# Patient Record
Sex: Male | Born: 1935 | Race: White | Hispanic: No | Marital: Married | Smoking: Former smoker
Health system: Southern US, Community
[De-identification: ages and names within clinical notes are randomized; demographics above are authoritative.]

## PROBLEM LIST (undated history)

## (undated) DIAGNOSIS — N182 Chronic kidney disease, stage 2 (mild): Secondary | ICD-10-CM

## (undated) DIAGNOSIS — C801 Malignant (primary) neoplasm, unspecified: Secondary | ICD-10-CM

## (undated) DIAGNOSIS — E785 Hyperlipidemia, unspecified: Secondary | ICD-10-CM

## (undated) DIAGNOSIS — J449 Chronic obstructive pulmonary disease, unspecified: Secondary | ICD-10-CM

## (undated) DIAGNOSIS — R7303 Prediabetes: Secondary | ICD-10-CM

## (undated) DIAGNOSIS — Q631 Lobulated, fused and horseshoe kidney: Secondary | ICD-10-CM

## (undated) DIAGNOSIS — G250 Essential tremor: Secondary | ICD-10-CM

## (undated) DIAGNOSIS — R55 Syncope and collapse: Secondary | ICD-10-CM

## (undated) DIAGNOSIS — D649 Anemia, unspecified: Secondary | ICD-10-CM

## (undated) DIAGNOSIS — I2510 Atherosclerotic heart disease of native coronary artery without angina pectoris: Secondary | ICD-10-CM

## (undated) DIAGNOSIS — F17201 Nicotine dependence, unspecified, in remission: Secondary | ICD-10-CM

## (undated) DIAGNOSIS — I10 Essential (primary) hypertension: Secondary | ICD-10-CM

## (undated) DIAGNOSIS — I679 Cerebrovascular disease, unspecified: Secondary | ICD-10-CM

## (undated) DIAGNOSIS — N289 Disorder of kidney and ureter, unspecified: Secondary | ICD-10-CM

## (undated) DIAGNOSIS — K449 Diaphragmatic hernia without obstruction or gangrene: Secondary | ICD-10-CM

## (undated) DIAGNOSIS — J984 Other disorders of lung: Secondary | ICD-10-CM

## (undated) DIAGNOSIS — M109 Gout, unspecified: Secondary | ICD-10-CM

## (undated) DIAGNOSIS — C3490 Malignant neoplasm of unspecified part of unspecified bronchus or lung: Secondary | ICD-10-CM

## (undated) HISTORY — PX: CARDIAC SURGERY: SHX584

## (undated) HISTORY — DX: Anemia, unspecified: D64.9

## (undated) HISTORY — DX: Disorder of kidney and ureter, unspecified: N28.9

## (undated) HISTORY — DX: Nicotine dependence, unspecified, in remission: F17.201

## (undated) HISTORY — DX: Hyperlipidemia, unspecified: E78.5

## (undated) HISTORY — DX: Essential tremor: G25.0

## (undated) HISTORY — DX: Chronic obstructive pulmonary disease, unspecified: J44.9

## (undated) HISTORY — PX: LESION EXCISION: SHX5167

## (undated) HISTORY — DX: Cerebrovascular disease, unspecified: I67.9

## (undated) HISTORY — DX: Chronic kidney disease, stage 2 (mild): N18.2

## (undated) HISTORY — DX: Essential (primary) hypertension: I10

## (undated) HISTORY — DX: Atherosclerotic heart disease of native coronary artery without angina pectoris: I25.10

## (undated) HISTORY — DX: Syncope and collapse: R55

## (undated) HISTORY — DX: Lobulated, fused and horseshoe kidney: Q63.1

---
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
## 1983-04-14 HISTORY — PX: COLONOSCOPY W/ POLYPECTOMY: SHX1380

## 1993-04-13 HISTORY — PX: CORONARY ARTERY BYPASS GRAFT: SHX141

## 2000-10-08 ENCOUNTER — Ambulatory Visit (HOSPITAL_COMMUNITY): Admission: RE | Admit: 2000-10-08 | Discharge: 2000-10-08 | Payer: Self-pay | Admitting: Cardiology

## 2000-10-12 ENCOUNTER — Ambulatory Visit (HOSPITAL_COMMUNITY): Admission: RE | Admit: 2000-10-12 | Discharge: 2000-10-13 | Payer: Self-pay | Admitting: Cardiology

## 2000-11-28 ENCOUNTER — Emergency Department (HOSPITAL_COMMUNITY): Admission: EM | Admit: 2000-11-28 | Discharge: 2000-11-28 | Payer: Self-pay | Admitting: Emergency Medicine

## 2000-11-28 ENCOUNTER — Encounter: Payer: Self-pay | Admitting: Emergency Medicine

## 2000-11-30 ENCOUNTER — Encounter: Payer: Self-pay | Admitting: Family Medicine

## 2000-11-30 ENCOUNTER — Ambulatory Visit (HOSPITAL_COMMUNITY): Admission: RE | Admit: 2000-11-30 | Discharge: 2000-11-30 | Payer: Self-pay | Admitting: Family Medicine

## 2001-01-18 ENCOUNTER — Ambulatory Visit (HOSPITAL_COMMUNITY): Admission: RE | Admit: 2001-01-18 | Discharge: 2001-01-18 | Payer: Self-pay | Admitting: Internal Medicine

## 2001-03-19 ENCOUNTER — Encounter: Payer: Self-pay | Admitting: Internal Medicine

## 2001-03-19 ENCOUNTER — Emergency Department (HOSPITAL_COMMUNITY): Admission: EM | Admit: 2001-03-19 | Discharge: 2001-03-19 | Payer: Self-pay | Admitting: Internal Medicine

## 2001-03-20 ENCOUNTER — Ambulatory Visit (HOSPITAL_COMMUNITY): Admission: RE | Admit: 2001-03-20 | Discharge: 2001-03-20 | Payer: Self-pay | Admitting: *Deleted

## 2001-03-20 ENCOUNTER — Encounter: Payer: Self-pay | Admitting: *Deleted

## 2001-08-15 ENCOUNTER — Ambulatory Visit (HOSPITAL_COMMUNITY): Admission: RE | Admit: 2001-08-15 | Discharge: 2001-08-15 | Payer: Self-pay | Admitting: Cardiology

## 2001-08-15 ENCOUNTER — Encounter: Payer: Self-pay | Admitting: Cardiology

## 2002-08-24 ENCOUNTER — Other Ambulatory Visit: Admission: RE | Admit: 2002-08-24 | Discharge: 2002-08-24 | Payer: Self-pay | Admitting: Dermatology

## 2002-09-05 ENCOUNTER — Ambulatory Visit (HOSPITAL_COMMUNITY): Admission: RE | Admit: 2002-09-05 | Discharge: 2002-09-05 | Payer: Self-pay | Admitting: Family Medicine

## 2002-09-05 ENCOUNTER — Encounter: Payer: Self-pay | Admitting: Family Medicine

## 2002-09-18 ENCOUNTER — Encounter: Payer: Self-pay | Admitting: Orthopedic Surgery

## 2002-09-18 ENCOUNTER — Ambulatory Visit (HOSPITAL_COMMUNITY): Admission: RE | Admit: 2002-09-18 | Discharge: 2002-09-18 | Payer: Self-pay | Admitting: Orthopedic Surgery

## 2003-12-28 ENCOUNTER — Emergency Department (HOSPITAL_COMMUNITY): Admission: EM | Admit: 2003-12-28 | Discharge: 2003-12-28 | Payer: Self-pay | Admitting: Emergency Medicine

## 2004-07-22 ENCOUNTER — Ambulatory Visit (HOSPITAL_COMMUNITY): Admission: RE | Admit: 2004-07-22 | Discharge: 2004-07-22 | Payer: Self-pay | Admitting: Family Medicine

## 2004-08-07 ENCOUNTER — Ambulatory Visit: Payer: Self-pay | Admitting: Cardiology

## 2004-08-21 ENCOUNTER — Ambulatory Visit: Payer: Self-pay | Admitting: Cardiology

## 2004-12-25 ENCOUNTER — Ambulatory Visit (HOSPITAL_COMMUNITY): Admission: RE | Admit: 2004-12-25 | Discharge: 2004-12-25 | Payer: Self-pay | Admitting: Family Medicine

## 2005-01-02 ENCOUNTER — Ambulatory Visit (HOSPITAL_COMMUNITY): Admission: RE | Admit: 2005-01-02 | Discharge: 2005-01-02 | Payer: Self-pay | Admitting: Family Medicine

## 2005-01-12 ENCOUNTER — Encounter (HOSPITAL_COMMUNITY): Admission: RE | Admit: 2005-01-12 | Discharge: 2005-02-11 | Payer: Self-pay | Admitting: Family Medicine

## 2005-02-16 ENCOUNTER — Ambulatory Visit (HOSPITAL_COMMUNITY): Admission: RE | Admit: 2005-02-16 | Discharge: 2005-02-16 | Payer: Self-pay | Admitting: Family Medicine

## 2005-02-19 ENCOUNTER — Ambulatory Visit: Payer: Self-pay | Admitting: Cardiology

## 2005-02-20 ENCOUNTER — Ambulatory Visit (HOSPITAL_COMMUNITY): Admission: RE | Admit: 2005-02-20 | Discharge: 2005-02-20 | Payer: Self-pay | Admitting: Cardiology

## 2005-02-25 ENCOUNTER — Ambulatory Visit (HOSPITAL_COMMUNITY): Admission: RE | Admit: 2005-02-25 | Discharge: 2005-02-25 | Payer: Self-pay

## 2005-02-25 ENCOUNTER — Ambulatory Visit: Payer: Self-pay | Admitting: Cardiology

## 2005-03-18 ENCOUNTER — Ambulatory Visit: Payer: Self-pay | Admitting: Internal Medicine

## 2005-04-09 ENCOUNTER — Ambulatory Visit: Payer: Self-pay | Admitting: Internal Medicine

## 2005-04-09 ENCOUNTER — Ambulatory Visit (HOSPITAL_COMMUNITY): Admission: RE | Admit: 2005-04-09 | Discharge: 2005-04-09 | Payer: Self-pay | Admitting: Internal Medicine

## 2005-04-13 ENCOUNTER — Emergency Department (HOSPITAL_COMMUNITY): Admission: EM | Admit: 2005-04-13 | Discharge: 2005-04-14 | Payer: Self-pay | Admitting: Emergency Medicine

## 2005-04-24 ENCOUNTER — Ambulatory Visit (HOSPITAL_COMMUNITY): Admission: RE | Admit: 2005-04-24 | Discharge: 2005-04-24 | Payer: Self-pay | Admitting: Family Medicine

## 2005-04-24 ENCOUNTER — Inpatient Hospital Stay (HOSPITAL_COMMUNITY): Admission: AD | Admit: 2005-04-24 | Discharge: 2005-05-02 | Payer: Self-pay | Admitting: Family Medicine

## 2005-05-13 ENCOUNTER — Ambulatory Visit (HOSPITAL_COMMUNITY): Admission: RE | Admit: 2005-05-13 | Discharge: 2005-05-13 | Payer: Self-pay | Admitting: Family Medicine

## 2005-06-06 ENCOUNTER — Emergency Department (HOSPITAL_COMMUNITY): Admission: EM | Admit: 2005-06-06 | Discharge: 2005-06-06 | Payer: Self-pay | Admitting: Emergency Medicine

## 2005-06-07 ENCOUNTER — Inpatient Hospital Stay (HOSPITAL_COMMUNITY): Admission: EM | Admit: 2005-06-07 | Discharge: 2005-06-09 | Payer: Self-pay | Admitting: Emergency Medicine

## 2005-06-08 ENCOUNTER — Ambulatory Visit: Payer: Self-pay | Admitting: *Deleted

## 2005-09-22 ENCOUNTER — Ambulatory Visit (HOSPITAL_COMMUNITY): Admission: RE | Admit: 2005-09-22 | Discharge: 2005-09-22 | Payer: Self-pay | Admitting: Family Medicine

## 2006-07-26 ENCOUNTER — Ambulatory Visit (HOSPITAL_COMMUNITY): Admission: RE | Admit: 2006-07-26 | Discharge: 2006-07-26 | Payer: Self-pay | Admitting: Family Medicine

## 2007-02-06 ENCOUNTER — Emergency Department (HOSPITAL_COMMUNITY): Admission: EM | Admit: 2007-02-06 | Discharge: 2007-02-07 | Payer: Self-pay | Admitting: Emergency Medicine

## 2007-02-10 ENCOUNTER — Ambulatory Visit (HOSPITAL_COMMUNITY): Admission: RE | Admit: 2007-02-10 | Discharge: 2007-02-10 | Payer: Self-pay | Admitting: Family Medicine

## 2007-02-24 ENCOUNTER — Ambulatory Visit: Payer: Self-pay | Admitting: *Deleted

## 2007-07-21 ENCOUNTER — Emergency Department (HOSPITAL_COMMUNITY): Admission: EM | Admit: 2007-07-21 | Discharge: 2007-07-21 | Payer: Self-pay | Admitting: Emergency Medicine

## 2007-08-19 ENCOUNTER — Ambulatory Visit (HOSPITAL_COMMUNITY): Admission: RE | Admit: 2007-08-19 | Discharge: 2007-08-19 | Payer: Self-pay | Admitting: Family Medicine

## 2008-04-04 ENCOUNTER — Emergency Department (HOSPITAL_COMMUNITY): Admission: EM | Admit: 2008-04-04 | Discharge: 2008-04-04 | Payer: Self-pay | Admitting: Emergency Medicine

## 2008-06-25 ENCOUNTER — Emergency Department (HOSPITAL_COMMUNITY): Admission: EM | Admit: 2008-06-25 | Discharge: 2008-06-25 | Payer: Self-pay | Admitting: Emergency Medicine

## 2008-07-17 ENCOUNTER — Ambulatory Visit: Payer: Self-pay | Admitting: Cardiology

## 2008-08-16 ENCOUNTER — Ambulatory Visit: Payer: Self-pay | Admitting: Cardiology

## 2008-08-30 ENCOUNTER — Ambulatory Visit: Payer: Self-pay | Admitting: Cardiology

## 2008-08-31 ENCOUNTER — Telehealth: Payer: Self-pay | Admitting: Cardiology

## 2008-09-14 ENCOUNTER — Telehealth: Payer: Self-pay | Admitting: Cardiology

## 2008-09-18 ENCOUNTER — Encounter: Payer: Self-pay | Admitting: Adult Health

## 2008-09-18 LAB — CONVERTED CEMR LAB
Chloride: 107 meq/L
Creatinine, Ser: 1.64 mg/dL
Glucose, Bld: 98 mg/dL

## 2009-01-14 DIAGNOSIS — I10 Essential (primary) hypertension: Secondary | ICD-10-CM

## 2009-01-17 ENCOUNTER — Ambulatory Visit: Payer: Self-pay | Admitting: Cardiology

## 2009-01-17 ENCOUNTER — Ambulatory Visit (HOSPITAL_COMMUNITY): Admission: RE | Admit: 2009-01-17 | Discharge: 2009-01-17 | Payer: Self-pay | Admitting: Adult Health

## 2009-01-17 ENCOUNTER — Encounter: Payer: Self-pay | Admitting: Adult Health

## 2009-01-17 LAB — CONVERTED CEMR LAB
Basophils Absolute: 0.1 10*3/uL (ref 0.0–0.1)
Chloride: 105 meq/L (ref 96–112)
Eosinophils Absolute: 0.4 10*3/uL (ref 0.0–0.7)
Eosinophils Relative: 5 % (ref 0–5)
Glucose, Bld: 90 mg/dL (ref 70–99)
Lymphs Abs: 2.5 10*3/uL (ref 0.7–4.0)
Monocytes Absolute: 1.3 10*3/uL — ABNORMAL HIGH (ref 0.1–1.0)
Monocytes Relative: 15 % — ABNORMAL HIGH (ref 3–12)
Potassium: 4.3 meq/L (ref 3.5–5.3)
Pro B Natriuretic peptide (BNP): 171.6 pg/mL — ABNORMAL HIGH (ref 0.0–100.0)
Sodium: 144 meq/L (ref 135–145)

## 2009-01-18 ENCOUNTER — Ambulatory Visit (HOSPITAL_COMMUNITY): Admission: RE | Admit: 2009-01-18 | Discharge: 2009-01-18 | Payer: Self-pay | Admitting: Cardiology

## 2009-01-18 ENCOUNTER — Ambulatory Visit: Payer: Self-pay | Admitting: Cardiovascular Disease

## 2009-01-18 ENCOUNTER — Encounter: Payer: Self-pay | Admitting: Cardiology

## 2009-01-30 ENCOUNTER — Ambulatory Visit: Payer: Self-pay | Admitting: Cardiology

## 2009-01-30 DIAGNOSIS — N182 Chronic kidney disease, stage 2 (mild): Secondary | ICD-10-CM

## 2009-04-01 ENCOUNTER — Encounter (INDEPENDENT_AMBULATORY_CARE_PROVIDER_SITE_OTHER): Payer: Self-pay | Admitting: *Deleted

## 2009-04-01 LAB — CONVERTED CEMR LAB
BUN: 28 mg/dL
Brain Natriuretic Peptide: 171.6
CO2: 25 meq/L
Chloride: 105 meq/L
Glucose, Bld: 90 mg/dL

## 2009-05-10 ENCOUNTER — Inpatient Hospital Stay (HOSPITAL_COMMUNITY): Admission: EM | Admit: 2009-05-10 | Discharge: 2009-05-12 | Payer: Self-pay | Admitting: Emergency Medicine

## 2009-05-13 ENCOUNTER — Encounter (INDEPENDENT_AMBULATORY_CARE_PROVIDER_SITE_OTHER): Payer: Self-pay | Admitting: *Deleted

## 2009-05-20 ENCOUNTER — Ambulatory Visit: Payer: Self-pay | Admitting: Cardiology

## 2009-05-20 ENCOUNTER — Encounter (INDEPENDENT_AMBULATORY_CARE_PROVIDER_SITE_OTHER): Payer: Self-pay | Admitting: *Deleted

## 2009-05-20 DIAGNOSIS — J449 Chronic obstructive pulmonary disease, unspecified: Secondary | ICD-10-CM

## 2009-05-20 DIAGNOSIS — G250 Essential tremor: Secondary | ICD-10-CM

## 2009-05-20 DIAGNOSIS — D649 Anemia, unspecified: Secondary | ICD-10-CM

## 2009-05-20 DIAGNOSIS — I2510 Atherosclerotic heart disease of native coronary artery without angina pectoris: Secondary | ICD-10-CM | POA: Insufficient documentation

## 2009-05-20 DIAGNOSIS — J4489 Other specified chronic obstructive pulmonary disease: Secondary | ICD-10-CM | POA: Insufficient documentation

## 2009-05-20 DIAGNOSIS — E785 Hyperlipidemia, unspecified: Secondary | ICD-10-CM

## 2009-05-20 DIAGNOSIS — I679 Cerebrovascular disease, unspecified: Secondary | ICD-10-CM | POA: Insufficient documentation

## 2009-05-20 DIAGNOSIS — D126 Benign neoplasm of colon, unspecified: Secondary | ICD-10-CM | POA: Insufficient documentation

## 2009-05-24 ENCOUNTER — Encounter (INDEPENDENT_AMBULATORY_CARE_PROVIDER_SITE_OTHER): Payer: Self-pay | Admitting: *Deleted

## 2009-05-24 LAB — CONVERTED CEMR LAB: OCCULT 2: NEGATIVE

## 2009-05-30 ENCOUNTER — Encounter: Payer: Self-pay | Admitting: Cardiology

## 2009-05-31 ENCOUNTER — Encounter (INDEPENDENT_AMBULATORY_CARE_PROVIDER_SITE_OTHER): Payer: Self-pay | Admitting: *Deleted

## 2009-06-18 ENCOUNTER — Ambulatory Visit (HOSPITAL_COMMUNITY)
Admission: RE | Admit: 2009-06-18 | Discharge: 2009-06-18 | Payer: Self-pay | Source: Home / Self Care | Admitting: Cardiology

## 2009-06-21 ENCOUNTER — Ambulatory Visit (HOSPITAL_COMMUNITY): Admission: RE | Admit: 2009-06-21 | Discharge: 2009-06-21 | Payer: Self-pay | Admitting: Family Medicine

## 2009-06-24 ENCOUNTER — Telehealth: Payer: Self-pay | Admitting: Cardiology

## 2009-06-24 ENCOUNTER — Encounter (INDEPENDENT_AMBULATORY_CARE_PROVIDER_SITE_OTHER): Payer: Self-pay | Admitting: *Deleted

## 2009-07-15 ENCOUNTER — Ambulatory Visit: Payer: Self-pay | Admitting: Surgery

## 2009-08-16 ENCOUNTER — Encounter: Payer: Self-pay | Admitting: Cardiology

## 2009-08-16 ENCOUNTER — Encounter (INDEPENDENT_AMBULATORY_CARE_PROVIDER_SITE_OTHER): Payer: Self-pay | Admitting: *Deleted

## 2009-08-16 LAB — CONVERTED CEMR LAB
ALT: 12 units/L
ALT: 12 units/L (ref 0–53)
Albumin: 4.2 g/dL
Albumin: 4.2 g/dL (ref 3.5–5.2)
Alkaline Phosphatase: 91 units/L
Alkaline Phosphatase: 91 units/L (ref 39–117)
Basophils Absolute: 0.1 10*3/uL (ref 0.0–0.1)
CO2: 26 meq/L
Calcium: 9.4 mg/dL
Chloride: 107 meq/L (ref 96–112)
Creatinine, Ser: 1.46 mg/dL (ref 0.40–1.50)
Eosinophils Absolute: 0.4 10*3/uL
Eosinophils Relative: 5 %
Eosinophils Relative: 5 % (ref 0–5)
Glucose, Bld: 105 mg/dL
HCT: 37.2 %
Hemoglobin: 11.9 g/dL
Iron: 85 ug/dL
MCHC: 32 g/dL
Monocytes Absolute: 0.8 10*3/uL (ref 0.1–1.0)
Monocytes Relative: 11 %
RBC: 3.89 M/uL
RDW: 14.8 % (ref 11.5–15.5)
Saturation Ratios: 29 %
Saturation Ratios: 29 % (ref 20–55)
Sodium: 142 meq/L
TIBC: 294 ug/dL (ref 215–435)
Total Bilirubin: 0.7 mg/dL (ref 0.3–1.2)
Total Protein: 6.4 g/dL (ref 6.0–8.3)
UIBC: 209 ug/dL
UIBC: 209 ug/dL
WBC: 7.5 10*3/uL

## 2009-08-19 ENCOUNTER — Encounter (INDEPENDENT_AMBULATORY_CARE_PROVIDER_SITE_OTHER): Payer: Self-pay | Admitting: *Deleted

## 2009-11-26 ENCOUNTER — Ambulatory Visit: Payer: Self-pay | Admitting: Cardiology

## 2009-11-26 ENCOUNTER — Encounter: Payer: Self-pay | Admitting: Adult Health

## 2009-12-30 ENCOUNTER — Ambulatory Visit: Payer: Self-pay

## 2009-12-30 ENCOUNTER — Ambulatory Visit: Payer: Self-pay | Admitting: Cardiovascular Disease

## 2010-02-27 ENCOUNTER — Ambulatory Visit: Payer: Self-pay | Admitting: Internal Medicine

## 2010-02-27 ENCOUNTER — Ambulatory Visit (HOSPITAL_COMMUNITY): Admission: RE | Admit: 2010-02-27 | Discharge: 2010-02-27 | Payer: Self-pay | Admitting: Internal Medicine

## 2010-05-04 ENCOUNTER — Encounter: Payer: Self-pay | Admitting: Cardiology

## 2010-05-13 NOTE — Assessment & Plan Note (Signed)
Summary: NP6 CARTOID ARTERY STENOSIS    Visit Type:  Initial Consult Primary Provider:  Dr.Scott Luking  CC:  sob.  History of Present Illness: 77 year-old male presents for evaluation of carotid artery stenosis. The patient has been followed with serial duplex ultrasound scans over the past several years through the [facility], but is no longer followed there since Dr Madilyn Fireman' departure. He has had carotid velocities showing moderate carotid stenosis, but with heavy calcification obscuring the vessels.   The patient has no history of TIA or stroke. He denies any history of amaurosis, aphasia, facial droop, numbness, weakness, or clumsiness of his extremities.  Current Medications (verified): 1)  Metoprolol Tartrate 50 Mg Tabs (Metoprolol Tartrate) .... Take One Tablet By Mouth Once Daily 2)  Cardizem Cd 180 Mg Xr24h-Cap (Diltiazem Hcl Coated Beads) .... Take 1 Tab Daily 3)  Simvastatin 80 Mg Tabs (Simvastatin) .... Take One Half Tablet By Mouth Daily 4)  Furosemide 40 Mg Tabs (Furosemide) .... Take 2 Tablets By Mouth Daily 5)  Terazosin Hcl 5 Mg Caps (Terazosin Hcl) .... Take 1 Tab Daily 6)  Aspir-Low 81 Mg Tbec (Aspirin) .... Take 1 Tab Daily 7)  Combivent 103-18 Mcg/act Aero (Ipratropium-Albuterol) .... Take As Needed 8)  Daily Multi  Tabs (Multiple Vitamins-Minerals) .... Take 1 Tab Daily  Allergies: 1)  ! Sulfa 2)  ! Penicillin 3)  ! Beta Blockers  Past History:  Past medical, surgical, family and social histories (including risk factors) reviewed, and no changes noted (except as noted below).  Past Medical History: Reviewed history from 05/20/2009 and no changes required. ASCVD: CABG surgery in 04/1993; and negative stress nuclear study in 08/2001 Hyperlipidemia SYNCOPE (ICD-780.2) HYPERTENSION (ICD-401.9) Tobacco abuse-quit: 40 pack years; discontinued in 1980 Cerebrovascular disease: Right carotid bruit-40-69% left internal carotid artery stenosis in 4/06; followed  [facility] Benign essential tremor Degenerative joint disease of the left knee Renal insufficiency: Creatinine of 2.2 in 06/2008 and 1.6 in 07/2008 Anemia-normocytic Chronic obstructive pulmonary disease by chest x-ray  Past Surgical History: Reviewed history from 05/20/2009 and no changes required. Coronary artery bypass graft surgery-1995 Colonic polypectomy approximately 1985 Pilonidal cyst excised-1948  Family History: Reviewed history from 05/20/2009 and no changes required. Father-deceased due to renal disease Mother-died with neoplastic disease No significant family history for coronary artery disease Brother has abdominal aortic aneurysm Brother died due to neoplastic disease of the colon Brother with Alzheimer's  Social History: Reviewed history from 05/20/2009 and no changes required. 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 ; 40-pack-year consumption discontinued 30 years ago Alcohol Use - no Regular Exercise - no Drug Use - no  Review of Systems       Negative except as per HPI   Vital Signs:  Patient profile:   77 year old male Height:      71 inches Weight:      186 pounds Pulse rate:   57 / minute Resp:     12 per minute BP sitting:   135 / 52  (left arm)  Vitals Entered By: Kem Parkinson (December 30, 2009 12:01 PM)  Serial Vital Signs/Assessments:  Time      Position  BP       Pulse  Resp  Temp     By           R Arm     135/53  Kimalexis Barnes           L Arm     135/52                         Kimalexis Barnes   Physical Exam  General:  Pt is alert and oriented, elderly male in no acute distress. HEENT: normal Neck: normal carotid upstrokes with bilateral bruits, JVP normal Lungs: CTA CV: RRR without murmur or gallop Abd: soft, NT, positive BS, no bruit, no organomegaly Ext: 1+ right leg edema. peripheral pulses 2+ and equal. Right femoral bruit. Skin: warm and dry  without rash    Carotid Doppler  Procedure date:  06/18/2009  Findings:      The following velocity measurements were obtained:   PEAK SYSTOLIC/END DIASTOLIC RIGHT ICA:                        102/12cm/sec CCA:                        104/13cm/sec SYSTOLIC ICA/CCA RATIO:     0.99 DIASTOLIC ICA/CCA RATIO:    0.89 ECA:                        128/7cm/sec   LEFT ICA:                        127/24cm/sec CCA:                        80/10cm/sec SYSTOLIC ICA/CCA RATIO:     1.59 DIASTOLIC ICA/CCA RATIO:    2.33 ECA:                        187/15cm/sec   Findings:   RIGHT CAROTID ARTERY: Moderate to severe calcific shadowing plaque formation limiting evaluation of the bifurcation.  Despite this, there is no definite velocity elevation, turbulent flow or hemodynamically significant ICA stenosis detected by ultrasound.   RIGHT VERTEBRAL ARTERY:  Antegrade   LEFT CAROTID ARTERY: Similar degree of moderate to severe calcific shadowing plaque formation.  Despite this, there is no significant velocity elevation, turbulent flow or hemodynamically significant stenosis detected by ultrasound.  Degree of narrowing is less than 50%.   LEFT VERTEBRAL ARTERY:  Antegrade   IMPRESSION: Bilateral carotid atherosclerosis but no hemodynamically significant ICA stenosis on either side by ultrasound.   Read By:  Sigurd Sos.,  M.D.     Released By:  Sigurd Sos.,  M.D.   Impression & Recommendations:  Problem # 1:  CAROTID ARTERY STENOSIS (ICD-433.10) This is an 77 year-old male with moderate asymptomatic ICA stenosis, based on doppler findings. He could have a more significant stenosis as the finding of heavy calcification can obscure visualization of more severe disease. I have recommended repeating a duplex scan here in the office so I can personally review the images. He is also due for his 6 month surveillance scanning at this time. Unless there is compelling evidence for  severe/critical stenosis, I would favor continuing with a medical therapy approach in the setting of his advanced age and asymptomatic status. He is on appropriate secondary risk reduction treatment with ASA and a statin drug. Will review his duplex scan later today for further recommendations. If this shows less than 80% stenosis, would continue with 6 month  carotid surveillance.  His updated medication list for this problem includes:    Aspir-low 81 Mg Tbec (Aspirin) .Marland Kitchen... Take 1 tab daily  Orders: Carotid Duplex (Carotid Duplex)  Patient Instructions: 1)  Your physician recommends that you continue on your current medications as directed. Please refer to the Current Medication list given to you today. 2)  Your physician has requested that you have a carotid duplex today. This test is an ultrasound of the carotid arteries in your neck. It looks at blood flow through these arteries that supply the brain with blood. Allow one hour for this exam. There are no restrictions or special instructions.

## 2010-05-13 NOTE — Miscellaneous (Signed)
Summary: labs bmp,bnp,01/17/2009  Clinical Lists Changes  Observations: Added new observation of CALCIUM: 8.8 mg/dL (83/15/1761 6:07) Added new observation of CREATININE: 2.90 mg/dL (37/01/6268 4:85) Added new observation of BUN: 28 mg/dL (46/27/0350 0:93) Added new observation of BG RANDOM: 90 mg/dL (81/82/9937 1:69) Added new observation of CO2 PLSM/SER: 25 meq/L (04/01/2009 8:39) Added new observation of CL SERUM: 105 meq/L (04/01/2009 8:39) Added new observation of K SERUM: 4.3 meq/L (04/01/2009 8:39) Added new observation of NA: 144 meq/L (04/01/2009 8:39) Added new observation of BNP: 171.6  (04/01/2009 8:39)

## 2010-05-13 NOTE — Assessment & Plan Note (Signed)
Summary: 6 mth f/uper checkuot on 05/20/09/tg   Visit Type:  Follow-up Primary Provider:  Dr.Scott Luking  CC:  some sob.  History of Present Illness: [name] is a  pleasant 77 y/o CM we are following with known history of known CAD, s/p CABG, COPD mild AoV stenosis, who is here for follow-up after having a carotid u/s in the setting of carotid bruits.  Carotid ultrasound was completed in March of 2011.  It demonstrated moderate to severe calcific shadowing bilaterally but no hemodynamically significant ICA stenosis.  Dr. Dietrich Pates referred him to Dr. Myra Gianotti who stated that he was a vascular surgeon and sent the patient home. He returns today without compaint but is confused that he was not seen by Dr. Myra Gianotti.  He continues as active as his COPD is allowing him to be, and he works out at a gym [employer]) daily by riding a bike and [employer].  He has no cardiac complaints at this time.  Current Medications (verified): 1)  Metoprolol Tartrate 50 Mg Tabs (Metoprolol Tartrate) .... Take One Tablet By Mouth Once Daily 2)  Cardizem Cd 180 Mg Xr24h-Cap (Diltiazem Hcl Coated Beads) .... Take 1 Tab Daily 3)  Simvastatin 80 Mg Tabs (Simvastatin) .... Take One Half Tablet By Mouth Daily 4)  Furosemide 40 Mg Tabs (Furosemide) .... Take 2 Tablets By Mouth Daily 5)  Terazosin Hcl 5 Mg Caps (Terazosin Hcl) .... Take 1 Tab Daily 6)  Aspir-Low 81 Mg Tbec (Aspirin) .... Take 1 Tab Daily 7)  Combivent 103-18 Mcg/act Aero (Ipratropium-Albuterol) .... Take As Needed 8)  Daily Multi  Tabs (Multiple Vitamins-Minerals) .... Take 1 Tab Daily  Allergies (verified): 1)  ! Sulfa 2)  ! Penicillin 3)  ! Beta Blockers  Past History:  Past medical, surgical, family and social histories (including risk factors) reviewed, and no changes noted (except as noted below).  Past Medical History: Reviewed history from 05/20/2009 and no changes required. ASCVD: CABG surgery in 04/1993; and negative stress nuclear  study in 08/2001 Hyperlipidemia SYNCOPE (ICD-780.2) HYPERTENSION (ICD-401.9) Tobacco abuse-quit: 40 pack years; discontinued in 1980 Cerebrovascular disease: Right carotid bruit-40-69% left internal carotid artery stenosis in 4/06; followed [facility] Benign essential tremor Degenerative joint disease of the left knee Renal insufficiency: Creatinine of 2.2 in 06/2008 and 1.6 in 07/2008 Anemia-normocytic Chronic obstructive pulmonary disease by chest x-ray  Past Surgical History: Reviewed history from 05/20/2009 and no changes required. Coronary artery bypass graft surgery-1995 Colonic polypectomy approximately 1985 Pilonidal cyst excised-1948  Family History: Reviewed history from 05/20/2009 and no changes required. Father-deceased due to renal disease Mother-died with neoplastic disease No significant family history for coronary artery disease Brother has abdominal aortic aneurysm Brother died due to neoplastic disease of the colon Brother with Alzheimer's  Social History: Reviewed history from 05/20/2009 and no changes required. 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 ; 40-pack-year consumption discontinued 30 years ago Alcohol Use - no Regular Exercise - no Drug Use - no  Review of Systems       All other systems have been reviewed and are negative unless stated above.   Vital Signs:  Patient profile:   77 year old male Weight:      181 pounds O2 Sat:      92 % on Room air Pulse rate:   56 / minute BP sitting:   148 / 45  (right arm)  Vitals Entered By: [name] Saa, CNA (November 26, 2009 2:31 PM)  O2 Flow:  Room air  Physical Exam  General:  Well developed, well nourished, in no acute distress. Neck:  Bilateral carotid bruits,R>L Lungs:  Diminshed bibasilar without wheezes.  No cough. Heart:  RRR with 1/6 systolic murmur.primary aortic area. Abdomen:  Bowel sounds positive; abdomen soft and non-tender without  masses, organomegaly, or hernias noted. No hepatosplenomegaly.No bruits Msk:  Back normal, normal gait. Muscle strength and tone normal. Extremities:  trace left pedal edema and trace right pedal edema.   Neurologic:  Alert and oriented x 3.  Some hearing difficulties Skin:  Intact without lesions or rashes. Psych:  Normal affect.   EKG  Procedure date:  11/26/2009  Findings:      Sinus tachycardia with rate of:  51 bpm  Impression & Recommendations:  Problem # 1:  CAROTID ARTERY STENOSIS (ICD-433.10) After discussion with Dr. Dietrich Pates and assessment of carotids, it has been advised that [name] be referred to Dr. Copper for recommendations concerning carotid disease.  Dr. Dietrich Pates requests that he be evaluated for need to have surgery. His updated medication list for this problem includes:    Aspir-low 81 Mg Tbec (Aspirin) .Marland Kitchen... Take 1 tab daily  Orders: EKG w/ Interpretation (93000)  Problem # 2:  ATHEROSCLEROTIC CARDIOVASCULAR DISEASE (ICD-429.2) Assessment: Unchanged  Problem # 3:  HYPERTENSION (ICD-401.9) Assessment: Unchanged Well controlled. His updated medication list for this problem includes:    Metoprolol Tartrate 50 Mg Tabs (Metoprolol tartrate) .Marland Kitchen... Take one tablet by mouth once daily    Cardizem Cd 180 Mg Xr24h-cap (Diltiazem hcl coated beads) .Marland Kitchen... Take 1 tab daily    Furosemide 40 Mg Tabs (Furosemide) .Marland Kitchen... Take 2 tablets by mouth daily    Terazosin Hcl 5 Mg Caps (Terazosin hcl) .Marland Kitchen... Take 1 tab daily    Aspir-low 81 Mg Tbec (Aspirin) .Marland Kitchen... Take 1 tab daily  Patient Instructions: 1)  Your physician wants you to follow-up in: 6 months. You will receive a reminder letter in the mail two months in advance. If you don't receive a letter, please call our office to schedule the follow-up appointment. 2)  Your physician recommends that you continue on your current medications as directed. Please refer to the Current Medication list given to you today. 3)  You have  been referred to DR. Excell Seltzer [place].

## 2010-05-13 NOTE — Assessment & Plan Note (Signed)
Summary: FU PER CHECKOUT ON 01/30/09/TG   Primary Provider:  Dr.Scott Luking   History of Present Illness: [name] returns as scheduled for continuing assessment and treatment of chronic dyspnea in the setting of known coronary artery disease and probable chronic obstructive pulmonary disease.  Since his last visit, he has done fairly well.  He was evaluated in the emergency department recently for GI symptoms and thought to have a pneumonia based upon his chest x-ray, which is chronically abnormal.  In any case, his original symptoms resolved after a course of antibiotics.  He remains fairly active with class 2-3 dyspnea on exertion.  He has had no significant chest discomfort.  He has had an episode of lightheadedness associated with the illness that brought him to the emergency department, but no syncope.  Current Medications (verified): 1)  Metoprolol Tartrate 50 Mg Tabs (Metoprolol Tartrate) .... Take One Tablet By Mouth Once Daily 2)  Cardizem Cd 180 Mg Xr24h-Cap (Diltiazem Hcl Coated Beads) .... Take 1 Tab Daily 3)  Simvastatin 80 Mg Tabs (Simvastatin) .... Take One Half Tablet By Mouth Daily 4)  Furosemide 40 Mg Tabs (Furosemide) .... Take 2 Tablets By Mouth Daily 5)  Terazosin Hcl 5 Mg Caps (Terazosin Hcl) .... Take 1 Tab Daily 6)  Aspir-Low 81 Mg Tbec (Aspirin) .... Take 1 Tab Daily 7)  Combivent 103-18 Mcg/act Aero (Ipratropium-Albuterol) .... Take As Needed 8)  Daily Multi  Tabs (Multiple Vitamins-Minerals) .... Take 1 Tab Daily  Allergies (verified): 1)  ! Sulfa 2)  ! Penicillin 3)  ! Beta Blockers  Past History:  PMH, FH, and Social History reviewed and updated.  Review of Systems       The patient complains of dyspnea on exertion and peripheral edema.  The patient denies anorexia, fever, weight loss, weight gain, vision loss, decreased hearing, hoarseness, chest pain, syncope, prolonged cough, headaches, hemoptysis, abdominal pain, melena, and hematochezia.      Vital Signs:  Patient profile:   77 year old male Weight:      189 pounds O2 Sat:      95 % Pulse rate:   59 / minute BP sitting:   140 / 59  (right arm)  Physical Exam  General:    Well developed; no acute distress; proportion of weight and height Neck-No JVD; bilateral carotid bruits Lungs-mildly decreased breath sounds at the bases; slightly prolonged expiratory phase. Cardiovascular-normal PMI; normal S1 and S2; grade 1/6 systolic murmur at the left sternal border Abdomen-BS normal; soft and non-tender without masses or organomegaly:  Musculoskeletal-No deformities, no cyanosis or clubbing: Neurologic-substantial tremor worse with intentional movement; normal cranial nerves; symmetric strength and tone:  Skin-Warm, no significant lesions: Extremities-Nl posterior tibial pulses, 1+ dorsalis pedis;1+ ankle and pretibial edema:     Impression & Recommendations:  Problem # 1:  ATHEROSCLEROTIC CARDIOVASCULAR DISEASE (ICD-429.2) Recent echocardiogram shows normal left ventricular systolic function.  Patient has apparently not undergone a stress test since 2003.  As the result of his relatively good performance status, I do not believe that such testing is warranted at the present time.  Current medication and generally appears suitable except for the possibility that metoprolol is exacerbating his chronic obstructive pulmonary disease.  Since the dosage of this medication is relatively low and no definite adverse affect is apparent, I would continue as is.  Problem # 2:  HYPERTENSION (ICD-401.9) Blood pressure control is good.  Current medications will be continued.  Problem # 3:  HYPERLIPIDEMIA (ICD-272.4) Recent  lipid profile demonstrated extremely low values for total cholesterol and LDL.  Since patient has moderate renal insufficiency,  his dose of simvastatin will be reduced to 40 mg q.d. with a repeat lipid profile to be obtained in the near future.  Problem # 4:  CHRONIC  KIDNEY DISEASE STAGE II (MILD) (ICD-585.2) Creatinine was worrisome with a value of 2.9 a few months ago; however, there has been substantial recovery to recent values of 1.7--1.8.  Current medications will be continued, and we will continue to monitor.  I will reevaluate this nice gentleman in 6 months.  Other Orders: Hemoccult Cards (Take Home) (Hemoccult Cards) Future Orders: T-CBC w/Diff ([id number]) ... 08/16/2009 T-Iron Binding Capacity (TIBC) ([id number]) ... 08/16/2009 T-Iron ([id number]) ... 08/16/2009 T-Comprehensive Metabolic Panel [phone]) ... 08/16/2009   Patient Instructions: 1)  Your physician recommends that you schedule a follow-up appointment in: 6 months 2)  Your physician recommends that you return for lab work in: 3 months 3)  Your physician has recommended you make the following change in your medication:  decrease simvastatin to 40mg  daily 4)  Your physician has asked that you test your stool for blood. It is necessary to test 3 different stool specimens for accuracy. You will be given 3 hemoccult cards for specimen collection. For each stool specimen, place a small portion of stool sample (from 2 different areas of the stool) into the 2 squares on the card. Close card. Repeat with 2 more stool specimens. Bring the cards back to the office for testing.

## 2010-05-13 NOTE — Miscellaneous (Signed)
Summary: hemocult results  Clinical Lists Changes  Observations: Added new observation of HEMOCCULT 3: neg (05/24/2009 9:45) Added new observation of HEMOCCULT 2: neg (05/24/2009 9:45) Added new observation of HEMOCCULT 1: neg (05/24/2009 9:45)

## 2010-05-13 NOTE — Miscellaneous (Signed)
Summary: Orders Update  Clinical Lists Changes  Problems: Added new problem of CAROTID ARTERY STENOSIS (ICD-433.10) Orders: Added new Referral order of [facility] Referral ([facility] Ref) - Signed Added new Referral order of Carotid Duplex (Carotid Duplex) - Signed

## 2010-05-13 NOTE — Letter (Signed)
Summary: [place] [facility] Work [facility], agricultural at [facility]  [address] [place] [address]   Phone: [phone]  Fax: [phone]     May 20, 2009 MRN: [id number]   [name] [address] [place], [place]  [address]      YOUR LAB WORK IS DUE   ______________MAY 6, 2011___________________________  Please go to [facility], located across the street from [facility] on the second floor.  Hours are Monday - Friday 7am until 7:30pm         Saturday 8am until 12noon    _X_  DO NOT EAT OR DRINK AFTER MIDNIGHT EVENING PRIOR TO LABWORK  __ YOUR LABWORK IS NOT FASTING --YOU MAY EAT PRIOR TO LABWORK

## 2010-05-13 NOTE — Progress Notes (Signed)
  Phone Note From Other Clinic   Caller: Dr. Lilyan Punt Call For: Dr. Dietrich Pates Request: Talk with Provider Details for Reason: Chronic obstructive pulmonary disease exacerbation vs. CHF Summary of Call: Dr. Gerda Diss has treated [name] for bronchitis and chronic obstructive pulmonary disease exacerbation without resolution of his symptoms.  Patient has also complained of orthopnea; however, BNP is 150, chest x-ray shows no CHF and exam does not suggest CHF.  There has been no weight gain.  I suggested continuing treatment of his pulmonary disease with referral to me should cardiac issues become more prominent or should his symptoms persist and remain unexplained.  [place] Bing, M.D.

## 2010-05-13 NOTE — Letter (Signed)
Summary: [place] Results Engineer, agricultural at [facility]  [address], [place] [address]   Phone: [phone]  Fax: [phone]      June 24, 2009 MRN: [id number]   [name] [address] [place], [place]  [address]   Dear Mr. TOMES,  Your test ordered by Selena Batten has been reviewed by your physician (or physician assistant) and was found to be normal or stable. Your physician (or physician assistant) felt no changes were needed at this time.  ____ Echocardiogram  ____ Cardiac Stress Test  __x__ Lab Work  __x__ Peripheral vascular study of arms, legs or neck (carotid ultrasound)  ____ CT scan or X-ray  ____ Lung or Breathing test  ____ Other:  No change in medical treatment at this time, per Dr. Dietrich Pates.  Thank you, Austen Wygant Allyne Gee RN    [place] Bing, MD, Lenise Arena.C.Gaylord Shih, MD, F.A.C.C Lewayne Bunting, MD, F.A.C.C Nona Dell, MD, F.A.C.C Charlton Haws, MD, Lenise Arena.C.C

## 2010-05-13 NOTE — Letter (Signed)
Summary: [place] Results Engineer, agricultural at [facility]  [address], [place] [address]   Phone: [phone]  Fax: [phone]      Aug 19, 2009 MRN: [id number]   [name] [address] [place], [place]  [address]   Dear Mr. HEVENER,  Your test ordered by Selena Batten has been reviewed by your physician (or physician assistant) and was found to be normal or stable. Your physician (or physician assistant) felt no changes were needed at this time.  ____ Echocardiogram  ____ Cardiac Stress Test  __x__ Lab Work  ____ Peripheral vascular study of arms, legs or neck  ____ CT scan or X-ray  ____ Lung or Breathing test  ____ Other: No change in medical treatment at this time, per Dr. Dietrich Pates.  Enclosed is a copy of your labwork for your records.  Thank you, [place] Allyne Gee RN    [place] Bing, MD, Lenise Arena.C.Gaylord Shih, MD, F.A.C.C Lewayne Bunting, MD, F.A.C.C Nona Dell, MD, F.A.C.C Charlton Haws, MD, Lenise Arena.C.C

## 2010-05-28 ENCOUNTER — Encounter (INDEPENDENT_AMBULATORY_CARE_PROVIDER_SITE_OTHER): Payer: Self-pay | Admitting: *Deleted

## 2010-05-30 ENCOUNTER — Encounter: Payer: Self-pay | Admitting: Cardiology

## 2010-05-30 ENCOUNTER — Ambulatory Visit (INDEPENDENT_AMBULATORY_CARE_PROVIDER_SITE_OTHER): Payer: Medicare Other | Admitting: Cardiology

## 2010-05-30 DIAGNOSIS — I2510 Atherosclerotic heart disease of native coronary artery without angina pectoris: Secondary | ICD-10-CM

## 2010-05-30 DIAGNOSIS — E782 Mixed hyperlipidemia: Secondary | ICD-10-CM

## 2010-06-04 NOTE — Miscellaneous (Signed)
Summary: iron,ibc,cbcd,cmp, 08/16/2009  Clinical Lists Changes  Observations: Added new observation of CALCIUM: 9.4 mg/dL (16/01/9603 54:09) Added new observation of ALBUMIN: 4.2 g/dL (81/19/1478 29:56) Added new observation of PROTEIN, TOT: 6.4 g/dL (21/30/8657 84:69) Added new observation of SGPT (ALT): 12 units/L (08/16/2009 14:14) Added new observation of SGOT (AST): 19 units/L (08/16/2009 14:14) Added new observation of ALK PHOS: 91 units/L (08/16/2009 14:14) Added new observation of CREATININE: 1.46 mg/dL (62/95/2841 32:44) Added new observation of BUN: 23 mg/dL (04/15/7251 66:44) Added new observation of BG RANDOM: 105 mg/dL (03/47/4259 56:38) Added new observation of CO2 PLSM/SER: 26 meq/L (08/16/2009 14:14) Added new observation of CL SERUM: 107 meq/L (08/16/2009 14:14) Added new observation of K SERUM: 4.1 meq/L (08/16/2009 14:14) Added new observation of NA: 142 meq/L (08/16/2009 14:14) Added new observation of ABSOLUTE BAS: 0.1 K/uL (08/16/2009 14:14) Added new observation of BASOPHIL %: 1 % (08/16/2009 14:14) Added new observation of EOS ABSLT: 0.4 K/uL (08/16/2009 14:14) Added new observation of % EOS AUTO: 5 % (08/16/2009 14:14) Added new observation of ABSOLUTE MON: 0.8 K/uL (08/16/2009 14:14) Added new observation of MONOCYTE %: 11 % (08/16/2009 14:14) Added new observation of ABS LYMPHOCY: 2.9 K/uL (08/16/2009 14:14) Added new observation of LYMPHS %: 39 % (08/16/2009 14:14) Added new observation of PLATELETK/UL: 216 K/uL (08/16/2009 14:14) Added new observation of RDW: 14.8 % (08/16/2009 14:14) Added new observation of MCHC RBC: 32.0 g/dL (75/64/3329 51:88) Added new observation of MCV: 95.6 fL (08/16/2009 14:14) Added new observation of HCT: 37.2 % (08/16/2009 14:14) Added new observation of HGB: 11.9 g/dL (41/66/0630 16:01) Added new observation of RBC M/UL: 3.89 M/uL (08/16/2009 14:14) Added new observation of WBC COUNT: 7.5 10*3/microliter (08/16/2009  14:14) Added new observation of IRON SATUR %: 29 % (08/16/2009 14:14) Added new observation of TIBC: 294 mcg/dL (09/32/3557 32:20) Added new observation of UIBC: 209 mcg/dL (25/42/7062 37:62) Added new observation of IRON: 85 mcg/dL (83/15/1761 60:73)

## 2010-06-10 NOTE — Assessment & Plan Note (Signed)
Summary: 6 mtn f/u per checkout on 11/26/09 sch per [id number]/lv   Visit Type:  Follow-up Primary Provider:  Dr.Scott Luking   History of Present Illness: [name] returns to the office for continued assessment and treatment of atrial fibrillation, coronary artery disease and multiple cardiovascular risk factors.  Since his last visit, he has done quite well.  He is active for his age and denies orthopnea, PND, chest discomfort, lightheadedness or syncope.  He exercises daily with light weights without difficulty, but experiences mild dyspnea when he walks at a moderate pace.  Current Medications (verified): 1)  Metoprolol Tartrate 50 Mg Tabs (Metoprolol Tartrate) .... Take One Tablet By Mouth Once Daily 2)  Cardizem Cd 180 Mg Xr24h-Cap (Diltiazem Hcl Coated Beads) .... Take 1 Tab Daily 3)  Furosemide 20 Mg Tabs (Furosemide) .... Take 2 Tabs Daily 4)  Terazosin Hcl 5 Mg Caps (Terazosin Hcl) .... Take 1 Tab Daily 5)  Aspir-Low 81 Mg Tbec (Aspirin) .... Take 1 Tab Daily 6)  Combivent 103-18 Mcg/act Aero (Ipratropium-Albuterol) .... Take As Needed 7)  Daily Multi  Tabs (Multiple Vitamins-Minerals) .... Take 1 Tab Daily 8)  Lipitor 40 Mg Tabs (Atorvastatin Calcium) .... Take 1 Tab Daily 9)  Terazosin Hcl 5 Mg Caps (Terazosin Hcl) .... Take 1 Tab Daily  Allergies (verified): 1)  ! Sulfa 2)  ! Penicillin 3)  ! Beta Blockers  Comments:  Nurse/Medical Assistant: patient brought prescriptions that were wrote per Dr.Luking as his med list  patient is on terozosin 5 mg Dr.LUking wrote for 50's and to my knowledge  doesnt come in that strength so i told patient he needs to go back and have it  redone. [facility]  Past History:  PMH, FH, and Social History reviewed and updated.  Review of Systems       See history of present illness.  Vital Signs:  Patient profile:   77 year old male Weight:      186 pounds BMI:     26.04 O2 Sat:      95 % on Room air Pulse  rate:   56 / minute BP sitting:   123 / 42  (left arm)  Vitals Entered By: Dreama Saa, CNA (May 30, 2010 11:23 AM)  O2 Flow:  Room air  Physical Exam  General:  Pt is alert and oriented, elderly male in no acute distress. HEENT: normal Neck: normal carotid upstrokes with bilateral bruits, more prominent on the right; no JVD Lungs: few scattered rales; decreased breath sounds at the bases CV: normal S1 and S2; grade 2/6 systolic ejection murmur at the cardiac base Abd: soft, NT, positive BS, no bruit, no organomegaly Ext: 1-2+ bilateral ankle edema. peripheral pulses are intact. Skin: warm and dry without rash    Impression & Recommendations:  Problem # 1:  CAROTID ARTERY STENOSIS (ICD-433.10) No critical focal stenosis with extensive atherosclerotic changes of the internal carotids proximally 6 months ago.  Periodic followup studies will be obtained.  Problem # 2:  ATHEROSCLEROTIC CARDIOVASCULAR DISEASE (ICD-429.2) Patient is asymptomatic from a cardiac standpoint.  Our focus will continue to be optimal management of risk factors.  Problem # 3:  HYPERLIPIDEMIA (ICD-272.4) No recent lipid profile available.  Patient has just switched from simvastatin 40 mg q.d. to atorvastatin 40 mg q.d.  Serum lipids will be reassessed in one month.  Problem # 4:  HYPERTENSION (ICD-401.9) Blood pressure control is good; current medications will be continued. BP today: 123/42 Prior  BP: 135/52 (12/30/2009)  Labs Reviewed: K+: 4.1 (08/16/2009) Creat: : 1.46 (08/16/2009)     Problem # 5:  CHRONIC KIDNEY DISEASE STAGE II (MILD) (ICD-585.2) BUN and creatinine values have actually improved relative to prior determinations.  No specific intervention is warranted at the present time.  Labs Reviewed: BUN: 23 (08/16/2009)   Cr: 1.46 (08/16/2009)    Hgb: 11.9 (08/16/2009)   Hct: 37.2 (08/16/2009)   Ca++: 9.4 (08/16/2009)    TP: 6.4 (08/16/2009)   Alb: 4.2 (08/16/2009)  Patient  Instructions: 1)  Your physician recommends that you schedule a follow-up appointment in: 1 YEAR 2)  Your physician recommends that you return for lab work in: 1 MONTH 3)  Your physician has requested that you limit the intake of sodium (salt) in your diet to four grams daily. Please see [facility] handout.

## 2010-06-19 ENCOUNTER — Other Ambulatory Visit (HOSPITAL_COMMUNITY): Payer: Self-pay | Admitting: Family Medicine

## 2010-06-19 ENCOUNTER — Ambulatory Visit (HOSPITAL_COMMUNITY)
Admission: RE | Admit: 2010-06-19 | Discharge: 2010-06-19 | Disposition: A | Discharge status: Home / Self Care | Payer: Medicare Other | Source: Ambulatory Visit | Attending: Family Medicine | Admitting: Family Medicine

## 2010-06-19 DIAGNOSIS — M25569 Pain in unspecified knee: Secondary | ICD-10-CM | POA: Insufficient documentation

## 2010-06-19 DIAGNOSIS — M25469 Effusion, unspecified knee: Secondary | ICD-10-CM | POA: Insufficient documentation

## 2010-06-19 DIAGNOSIS — M25562 Pain in left knee: Secondary | ICD-10-CM

## 2010-06-19 DIAGNOSIS — R937 Abnormal findings on diagnostic imaging of other parts of musculoskeletal system: Secondary | ICD-10-CM | POA: Insufficient documentation

## 2010-06-29 LAB — DIFFERENTIAL
Basophils Absolute: 0 10*3/uL (ref 0.0–0.1)
Basophils Absolute: 0 10*3/uL (ref 0.0–0.1)
Basophils Relative: 0 % (ref 0–1)
Basophils Relative: 0 % (ref 0–1)
Eosinophils Absolute: 0.1 10*3/uL (ref 0.0–0.7)
Eosinophils Absolute: 0.4 10*3/uL (ref 0.0–0.7)
Eosinophils Relative: 1 % (ref 0–5)
Lymphocytes Relative: 18 % (ref 12–46)
Lymphs Abs: 3.2 10*3/uL (ref 0.7–4.0)
Monocytes Absolute: 0.9 10*3/uL (ref 0.1–1.0)
Monocytes Relative: 10 % (ref 3–12)
Neutro Abs: 9.9 10*3/uL — ABNORMAL HIGH (ref 1.7–7.7)
Neutrophils Relative %: 53 % (ref 43–77)
Neutrophils Relative %: 88 % — ABNORMAL HIGH (ref 43–77)

## 2010-06-29 LAB — COMPREHENSIVE METABOLIC PANEL
ALT: 15 U/L (ref 0–53)
AST: 21 U/L (ref 0–37)
Albumin: 3.7 g/dL (ref 3.5–5.2)
Alkaline Phosphatase: 66 U/L (ref 39–117)
Alkaline Phosphatase: 85 U/L (ref 39–117)
BUN: 19 mg/dL (ref 6–23)
CO2: 29 mEq/L (ref 19–32)
CO2: 30 mEq/L (ref 19–32)
Calcium: 8.9 mg/dL (ref 8.4–10.5)
Chloride: 104 mEq/L (ref 96–112)
Chloride: 104 mEq/L (ref 96–112)
Chloride: 106 mEq/L (ref 96–112)
Creatinine, Ser: 1.71 mg/dL — ABNORMAL HIGH (ref 0.4–1.5)
GFR calc Af Amer: 46 mL/min — ABNORMAL LOW (ref >60)
GFR calc Af Amer: 49 mL/min — ABNORMAL LOW (ref >60)
GFR calc non Af Amer: 38 mL/min — ABNORMAL LOW (ref >60)
GFR calc non Af Amer: 41 mL/min — ABNORMAL LOW (ref >60)
GFR calc non Af Amer: 42 mL/min — ABNORMAL LOW (ref >60)
Glucose, Bld: 139 mg/dL — ABNORMAL HIGH (ref 70–99)
Glucose, Bld: 148 mg/dL — ABNORMAL HIGH (ref 70–99)
Potassium: 3.7 mEq/L (ref 3.5–5.1)
Potassium: 4 mEq/L (ref 3.5–5.1)
Potassium: 4.2 mEq/L (ref 3.5–5.1)
Total Bilirubin: 0.6 mg/dL (ref 0.3–1.2)
Total Bilirubin: 0.7 mg/dL (ref 0.3–1.2)
Total Protein: 6.3 g/dL (ref 6.0–8.3)
Total Protein: 6.6 g/dL (ref 6.0–8.3)

## 2010-06-29 LAB — CBC
HCT: 29.2 % — ABNORMAL LOW (ref 39.0–52.0)
HCT: 32.8 % — ABNORMAL LOW (ref 39.0–52.0)
Hemoglobin: 10.9 g/dL — ABNORMAL LOW (ref 13.0–17.0)
MCV: 94 fL (ref 78.0–100.0)
MCV: 94.2 fL (ref 78.0–100.0)
Platelets: 180 10*3/uL (ref 150–400)
RBC: 3.1 MIL/uL — ABNORMAL LOW (ref 4.22–5.81)
RBC: 3.41 MIL/uL — ABNORMAL LOW (ref 4.22–5.81)
RDW: 14.1 % (ref 11.5–15.5)
RDW: 14.4 % (ref 11.5–15.5)
WBC: 8 10*3/uL (ref 4.0–10.5)
WBC: 9.9 10*3/uL (ref 4.0–10.5)

## 2010-06-29 LAB — CARDIAC PANEL(CRET KIN+CKTOT+MB+TROPI)
CK, MB: 5 ng/mL — ABNORMAL HIGH (ref 0.3–4.0)
Relative Index: 2.6 — ABNORMAL HIGH (ref 0.0–2.5)
Relative Index: 6.1 — ABNORMAL HIGH (ref 0.0–2.5)
Total CK: 121 U/L (ref 7–232)
Total CK: 138 U/L (ref 7–232)
Total CK: 145 U/L (ref 7–232)
Troponin I: 0.04 ng/mL (ref 0.00–0.06)
Troponin I: 0.04 ng/mL (ref 0.00–0.06)
Troponin I: 0.04 ng/mL (ref 0.00–0.06)

## 2010-06-29 LAB — CULTURE, BLOOD (ROUTINE X 2)

## 2010-06-29 LAB — LIPID PANEL
Cholesterol: 128 mg/dL (ref 0–200)
HDL: 77 mg/dL (ref >39)
LDL Cholesterol: 45 mg/dL (ref 0–99)
Total CHOL/HDL Ratio: 1.7 RATIO

## 2010-06-29 LAB — POCT CARDIAC MARKERS
CKMB, poc: 1 ng/mL (ref 1.0–8.0)
Troponin i, poc: <0.05 ng/mL (ref 0.00–0.09)

## 2010-06-29 LAB — URINALYSIS, ROUTINE W REFLEX MICROSCOPIC
Bilirubin Urine: NEGATIVE
Ketones, ur: NEGATIVE mg/dL
Nitrite: NEGATIVE
Urobilinogen, UA: 0.2 mg/dL (ref 0.0–1.0)

## 2010-06-29 LAB — TSH: TSH: 3.106 u[IU]/mL (ref 0.350–4.500)

## 2010-07-24 LAB — DIFFERENTIAL
Basophils Absolute: 0 10*3/uL (ref 0.0–0.1)
Eosinophils Absolute: 0.1 10*3/uL (ref 0.0–0.7)
Eosinophils Relative: 1 % (ref 0–5)
Lymphocytes Relative: 11 % — ABNORMAL LOW (ref 12–46)
Lymphs Abs: 1 10*3/uL (ref 0.7–4.0)
Monocytes Absolute: 0.5 10*3/uL (ref 0.1–1.0)

## 2010-07-24 LAB — URINALYSIS, ROUTINE W REFLEX MICROSCOPIC
Bilirubin Urine: NEGATIVE
Glucose, UA: NEGATIVE mg/dL
Hgb urine dipstick: NEGATIVE
Ketones, ur: NEGATIVE mg/dL
Specific Gravity, Urine: 1.025 (ref 1.005–1.030)
pH: 6 (ref 5.0–8.0)

## 2010-07-24 LAB — BASIC METABOLIC PANEL
BUN: 32 mg/dL — ABNORMAL HIGH (ref 6–23)
Chloride: 106 mEq/L (ref 96–112)
GFR calc non Af Amer: 33 mL/min — ABNORMAL LOW (ref >60)
Glucose, Bld: 162 mg/dL — ABNORMAL HIGH (ref 70–99)
Potassium: 4.7 mEq/L (ref 3.5–5.1)
Sodium: 137 mEq/L (ref 135–145)

## 2010-07-24 LAB — CBC
HCT: 29.2 % — ABNORMAL LOW (ref 39.0–52.0)
Hemoglobin: 10 g/dL — ABNORMAL LOW (ref 13.0–17.0)
MCV: 93.9 fL (ref 78.0–100.0)
Platelets: 179 10*3/uL (ref 150–400)
RDW: 13.9 % (ref 11.5–15.5)

## 2010-08-17 ENCOUNTER — Emergency Department (HOSPITAL_COMMUNITY): Payer: Medicare Other

## 2010-08-17 ENCOUNTER — Emergency Department (HOSPITAL_COMMUNITY)
Admission: EM | Admit: 2010-08-17 | Discharge: 2010-08-17 | Disposition: A | Discharge status: Home / Self Care | Payer: Medicare Other | Attending: Emergency Medicine | Admitting: Emergency Medicine

## 2010-08-17 DIAGNOSIS — K5730 Diverticulosis of large intestine without perforation or abscess without bleeding: Secondary | ICD-10-CM | POA: Insufficient documentation

## 2010-08-17 DIAGNOSIS — I2510 Atherosclerotic heart disease of native coronary artery without angina pectoris: Secondary | ICD-10-CM | POA: Insufficient documentation

## 2010-08-17 DIAGNOSIS — E785 Hyperlipidemia, unspecified: Secondary | ICD-10-CM | POA: Insufficient documentation

## 2010-08-17 DIAGNOSIS — I10 Essential (primary) hypertension: Secondary | ICD-10-CM | POA: Insufficient documentation

## 2010-08-17 DIAGNOSIS — R1031 Right lower quadrant pain: Secondary | ICD-10-CM | POA: Insufficient documentation

## 2010-08-17 DIAGNOSIS — J4489 Other specified chronic obstructive pulmonary disease: Secondary | ICD-10-CM | POA: Insufficient documentation

## 2010-08-17 DIAGNOSIS — I519 Heart disease, unspecified: Secondary | ICD-10-CM | POA: Insufficient documentation

## 2010-08-17 DIAGNOSIS — J449 Chronic obstructive pulmonary disease, unspecified: Secondary | ICD-10-CM | POA: Insufficient documentation

## 2010-08-17 DIAGNOSIS — K5900 Constipation, unspecified: Secondary | ICD-10-CM | POA: Insufficient documentation

## 2010-08-17 DIAGNOSIS — R1032 Left lower quadrant pain: Secondary | ICD-10-CM | POA: Insufficient documentation

## 2010-08-17 DIAGNOSIS — Z79899 Other long term (current) drug therapy: Secondary | ICD-10-CM | POA: Insufficient documentation

## 2010-08-17 LAB — BASIC METABOLIC PANEL
BUN: 38 mg/dL — ABNORMAL HIGH (ref 6–23)
Calcium: 9.9 mg/dL (ref 8.4–10.5)
GFR calc non Af Amer: 37 mL/min — ABNORMAL LOW (ref >60)
Glucose, Bld: 146 mg/dL — ABNORMAL HIGH (ref 70–99)
Sodium: 138 mEq/L (ref 135–145)

## 2010-08-17 LAB — URINALYSIS, ROUTINE W REFLEX MICROSCOPIC
Bilirubin Urine: NEGATIVE
Ketones, ur: NEGATIVE mg/dL
Nitrite: NEGATIVE
Specific Gravity, Urine: 1.02 (ref 1.005–1.030)
Urobilinogen, UA: 0.2 mg/dL (ref 0.0–1.0)

## 2010-08-17 LAB — CBC
MCV: 97.9 fL (ref 78.0–100.0)
Platelets: 181 10*3/uL (ref 150–400)
RDW: 14.5 % (ref 11.5–15.5)
WBC: 12.7 10*3/uL — ABNORMAL HIGH (ref 4.0–10.5)

## 2010-08-17 LAB — DIFFERENTIAL
Basophils Relative: 0 % (ref 0–1)
Eosinophils Absolute: 0.1 10*3/uL (ref 0.0–0.7)
Eosinophils Relative: 1 % (ref 0–5)
Lymphs Abs: 2.2 10*3/uL (ref 0.7–4.0)
Neutrophils Relative %: 71 % (ref 43–77)

## 2010-08-17 MED ORDER — IOHEXOL 300 MG/ML  SOLN
80.0000 mL | Freq: Once | INTRAMUSCULAR | Status: AC | PRN
  Administered 2010-08-17: 80 mL via INTRAVENOUS

## 2010-08-18 ENCOUNTER — Ambulatory Visit (INDEPENDENT_AMBULATORY_CARE_PROVIDER_SITE_OTHER): Payer: Medicare Other | Admitting: Cardiology

## 2010-08-18 ENCOUNTER — Encounter: Payer: Self-pay | Admitting: Cardiology

## 2010-08-18 DIAGNOSIS — I2510 Atherosclerotic heart disease of native coronary artery without angina pectoris: Secondary | ICD-10-CM

## 2010-08-18 DIAGNOSIS — Z87891 Personal history of nicotine dependence: Secondary | ICD-10-CM

## 2010-08-18 DIAGNOSIS — F17201 Nicotine dependence, unspecified, in remission: Secondary | ICD-10-CM | POA: Insufficient documentation

## 2010-08-18 DIAGNOSIS — R109 Unspecified abdominal pain: Secondary | ICD-10-CM

## 2010-08-18 DIAGNOSIS — R079 Chest pain, unspecified: Secondary | ICD-10-CM | POA: Insufficient documentation

## 2010-08-18 DIAGNOSIS — M1710 Unilateral primary osteoarthritis, unspecified knee: Secondary | ICD-10-CM

## 2010-08-18 DIAGNOSIS — M1712 Unilateral primary osteoarthritis, left knee: Secondary | ICD-10-CM | POA: Insufficient documentation

## 2010-08-18 DIAGNOSIS — R55 Syncope and collapse: Secondary | ICD-10-CM | POA: Insufficient documentation

## 2010-08-18 MED ORDER — NITROGLYCERIN 0.4 MG SL SUBL: 0.4000 mg | SUBLINGUAL_TABLET | SUBLINGUAL | Status: DC | PRN

## 2010-08-18 NOTE — Progress Notes (Signed)
HPI :  [name] returns to the office at the request of his primary care physician, Dr. Lilyan Punt, for evaluation of chest discomfort.  This very nice gentleman suffered a single episode of sharp moderately severe left chest discomfort approximately 3 weeks ago that lasted for 30 seconds.  He was walking at the time and experiencing exertional dyspnea, which is his norm.  Since that single spell, he has had no recurrent discomfort despite levels of activity higher than on medication.  In fact, he exercises at the gym at least 3 days per week.  He was evaluated in the emergency department yesterday for abdominal pain.  [name] describes right lower quadrant or even right inguinal constant severe pain that has nearly subsided since yesterday.  There is no exacerbation with walking, coughing or palpation.  He has no associated GI symptoms, although he may be mildly constipated.  Evaluation included a CT scan was negative.   Extensive atherosclerotic calcifications were incidentally noted in the aorta and its branches.  Also seen was right middle lobe atelectasis, scarring at the right base, a horseshoe kidney, a redundant sigmoid colon with considerable diverticulosis, an umbilical hernia, prostate enlargement and lumbar spondylosis.  There was some radiation of his right lower quadrant pain to the right chest, but no associated symptoms.  Current Outpatient Prescriptions on File Prior to Visit  Medication Sig Dispense Refill  . albuterol-ipratropium (COMBIVENT) 18-103 MCG/ACT inhaler Inhale 2 puffs into the lungs every 6 (six) hours as needed.        Marland Kitchen aspirin 81 MG tablet Take 81 mg by mouth daily.        Marland Kitchen atorvastatin (LIPITOR) 40 MG tablet Take 40 mg by mouth daily.        Marland Kitchen diltiazem (CARDIZEM CD) 180 MG 24 hr capsule Take 180 mg by mouth daily.        . furosemide (LASIX) 20 MG tablet Take 20 mg by mouth 2 (two) times daily.        . metoprolol (LOPRESSOR) 50 MG tablet Take 50 mg by mouth daily.        . Multiple Vitamins-Minerals (MULTIVITAMIN WITH MINERALS) tablet Take 1 tablet by mouth daily.        Marland Kitchen terazosin (HYTRIN) 5 MG capsule Take 5 mg by mouth at bedtime.         Current Facility-Administered Medications on File Prior to Visit  Medication Dose Route Frequency Provider Last Rate Last Dose  . iohexol (OMNIPAQUE) 300 MG/ML injection 80 mL  80 mL Intravenous Once PRN Medication Radiologist   80 mL at 08/17/10 1700     Allergies  Allergen Reactions  . Beta Adrenergic Blockers   . Penicillins   . Sulfonamide Derivatives       Past medical history, social history, and family history reviewed and updated.  ROS:  See history of present illness.  PHYSICAL EXAM: BP 131/39  Pulse 62  Ht 5\' 5"  (1.651 m)  Wt 187 lb (84.823 kg)  BMI 31.12 kg/m2  General-Well developed; no acute distress Body habitus-proportionate weight and height Neck-No JVD, soft bilateral carotid bruits Lungs: clear lung fields; normal I:E ratio Cardiovascular-normal PMI; normal S1 and S2; modest systolic murmur at the lower left sternal border; split S1 Abdomen-normal bowel sounds; soft and non-tender without masses or organomegaly Skin-Warm, no significant lesions Extremities-Nl distal pulses, no edema  Lab:  Minimal anemia with hemoglobin of 11.9 and hematocrit of 37.  MCV was normal.  Chemistry profile normal  except for a BUN of 38 and a creatinine of 1.77.  Urinalysis negative.  EKG on 08/17/10 notable for frequent PVCs; otherwise entirely normal.  ASSESSMENT AND PLAN:

## 2010-08-18 NOTE — Assessment & Plan Note (Addendum)
I doubt that the functional testing is worthwhile for a single episode of chest discomfort lasting a matter of seconds.  Fortunately, patient has had no recurrence and has fairly good exercise tolerance.  Risk factor control is excellent, current therapy will be continued.  Patient will be provided with a prescription for SL nitroglycerin to be used for recurrent symptoms, if any.

## 2010-08-18 NOTE — Assessment & Plan Note (Addendum)
Abdominal discomfort is nonspecific.  Exam is benign, and he was afebrile with negative testing yesterday.  He will consult with Dr. Lilyan Punt should symptoms persist for more than the next 48 hours.  I will plan to see him again in 10 months as previously scheduled.

## 2010-08-18 NOTE — Assessment & Plan Note (Signed)
[name] currently has no symptoms to suggest myocardial ischemia; other than optimal control of risk factors,

## 2010-08-18 NOTE — Patient Instructions (Signed)
Your physician recommends that you schedule a follow-up appointment in: AS PLANNED Your physician has recommended you make the following change in your medication: NITROGLYCERIN 0.4MG  UNDER TONGUE AS NEEDED X3 FOR CHEST PAIN CALL FOR RECURRENT CHEST PAIN CALL DR. Gerda Diss IF ABDOMINAL DISCOMFORT NOT GONE IN 2 DAYS .

## 2010-08-26 NOTE — Assessment & Plan Note (Signed)
[facility]                       [place] CARDIOLOGY OFFICE NOTE   [name], [name]                         MRN:          [id number]  DATE:07/17/2008                            DOB:          1921/11/06    CARDIOLOGIST:  Gerrit Friends. Dietrich Pates, MD, [facility]   PRIMARY CARE PHYSICIAN:  Scott A. Gerda Diss, MD   REASON FOR VISIT:  Near syncope.   HISTORY OF PRESENT ILLNESS:  [name] [name] is an 77 year old male patient  with a history of coronary artery disease, status post bypass surgery in  1995 with a LIMA to the LAD and diagonal, vein graft to the obtuse  marginal, vein graft to the RCA.  He has had overall preserved LV  function in the past.  His last echocardiogram was in 2006 and he had  mild-to-moderate aortic sclerosis and normal LV function.  His last  ischemic evaluation was in 2003 when he had a gated exercise treadmill  Cardiolite study that was negative for ischemia.  He has cerebrovascular  disease with evidence of 40-69% left ICA stenosis in the past.  This is  followed chronically by Dr. Madilyn Fireman in [place] with q.6-12 month  assessments.  He has been lost a follow up over the last several years.   He recently presented to Dr. Fletcher Anon office for followup on a visit to  the emergency room.  It should be noted that he is quite active.  He  works out at a gym for 1-2 hours a day.  He rides a bike and does other  types of weightlifting exercises.  He denies any significant chest pain  or shortness of breath with this.  He is also very active around his  house and does some manual labor out in his yard.  He denies any  significant chest pain or shortness of breath with this.  He returned  home about 1-2 weeks ago after working out at [employer] and ate lunch.  Shortly after eating lunch, he felt lightheaded.  He says that he felt  as though he was going to pass out.  He sat back down and became  nauseated and eventually had emesis x1.  He felt  significantly better  with this.  However, he continued to be weak and EMS was activated.  He  was transported to [facility] Emergency Room.  He had 1 more  episode of emesis and felt significantly better after this.  In the  emergency room, he noted somewhat of a cough and a chest x-ray was  suspicious for right lower lobe pneumonia.  His hemoglobin was noted be  low at 10 with an MCV of 93.9.  His creatinine was 1.96.  Of note, his  urinalysis was normal (i.e. negative for proteinuria).  He was treated  for his pneumonia and discharged home.   He has not had a recurrence of near syncope.  He has not had any  significant exertional chest pain or shortness of breath.  It should be  noted that he does have some exertional shortness of breath that has  been chronic  for many years.  He is an ex-smoker.  He has had a  prior  chest x-ray that is positive for emphysematous changes.  He describes  NYHA class II symptoms.  He denies orthopnea or PND.  He does note some  pedal edema that is managed by taking furosemide on a daily basis.  Given his elevated creatinine, his lisinopril was discontinued.  Recent  followup blood work demonstrates a creatinine of 1.6 from lab work drawn  on July 10, 2008.   PAST MEDICAL HISTORY:  As outlined above.  1. Dyslipidemia - followed by Dr. Gerda Diss.  2. Hypertension.  3. Benign essential tremor.  4. Osteoarthritis of the left knee.   MEDICATIONS:  1. Furosemide 20 mg daily.  2. Zocor 80 mg daily.  3. Terazosin 5 mg daily.  4. Aspirin 81 mg daily.  5. Multivitamin daily.   ALLERGIES:  LEVAQUIN, PENICILLIN, and SULFA.   SOCIAL HISTORY:  He is an ex-smoker.   REVIEW OF SYSTEMS:  Please see HPI.  Denies fevers, chills, melena,  hematochezia, hematuria, or dysuria.  All other systems reviewed and  negative.   PHYSICAL EXAMINATION:  GENERAL:  He is a well-nourished well-developed  elderly male in no distress.  VITAL SIGNS:  Blood pressure  146/70, pulse 68, and weight 190 pounds.  Orthostatic vital signs:  165/75, pulse 84 - lying; 158/72, pulse 83 -  sitting; 152/73, pulse 82 - standing; 166/75, pulse 86 - after 2  minutes; 173/75 with a pulse of 88 - after 5 minutes.  HEENT:  Normal.  NECK:  Without JVD.  LYMPH:  Without lymphadenopathy.  ENDOCRINE:  Without thyromegaly.  CARDIAC:  Normal S1 and S2.  Regular rate and rhythm with 1-2/6 systolic  ejection murmur best heard at right sternal border.  LUNGS:  Decreased  breath sounds suspicious through right base with crackles noted.  No  wheezing.  ABDOMEN:  Soft, nontender.  No organomegaly.  EXTREMITIES:  With trace ankle edema bilaterally.  NEUROLOGIC:  He is alert and oriented x3.  Cranial nerves II-XII are  grossly intact.  SKIN:  Warm and dry.  VASCULAR:  Bilateral carotid bruits noted.   Electrocardiogram reveals sinus rhythm with heart rate of 80, normal  axis, PR intervals of 170 msec,  and no ischemic changes.   ASSESSMENT AND PLAN:  1. Near syncope.  The patient was also interviewed and examined by Dr.      Dietrich Pates.  The patient's symptoms seem to be most consistent with      either a vasovagal episode versus postprandial orthostatic      hypotension.  After further review with Dr. Dietrich Pates, it is not      felt that the patient needs further cardiovascular workup at this      time.  2. Coronary artery disease, status post bypass surgery in 1995.  The      patient is quite active and is not having any anginal symptoms at      this point in time.  He will continue on aspirin.  3. Hypertension.  This is uncontrolled.  This is in the setting of      recent discontinuation of his lisinopril.  He will be started on      diltiazem CD 180 mg daily.  He will have a followup blood pressure      check with the nurse.  4. Renal insufficiency.  As noted above, the patient's lisinopril was      recently discontinued.  He may benefit from reinitiation of ACE       inhibitor therapy in the long term.  However, this is usually      indicated in chronic renal disease that is proteinuric.  As noted      above, the patient had no evidence of protein in his urine on      recent evaluation.  Further evaluation will be per Dr. Gerda Diss.  5. Dyslipidemia.  This is followed by Dr. Gerda Diss.  His goal LDL is      less than or equal to 70.   DISPOSITION:  The patient will be brought back for blood pressure check  in a month with the nurse.  He will be brought back in followup with Dr.  Dietrich Pates in 6 months or sooner p.r.n.  Dr. Dietrich Pates discussed with the  patient today proper hygiene for symptoms of orthostasis.      Tereso Newcomer, PA-C  Electronically Signed      Gerrit Friends. Dietrich Pates, MD, [facility]  Electronically Signed   SW/MedQ  DD: 07/17/2008  DT: 07/18/2008  Job #: [id number]   cc:   Lorin Picket A. Gerda Diss, MD

## 2010-08-26 NOTE — Procedures (Signed)
CAROTID DUPLEX EXAM   INDICATION:  Followup for carotid artery disease.  Patient says he had 2  episodes of near syncope 2 weeks ago.  He had multiple tests done  including an MRI which were all negative.   HISTORY:  Diabetes:  No  Cardiac:  No  Hypertension:  Yes  Smoking:  No  Previous Surgery:  No  CV History:  No  Amaurosis Fugax No, Paresthesias No, Hemiparesis No                                       RIGHT             LEFT  Brachial systolic pressure:         130               130  Brachial Doppler waveforms:         Biphasic          Biphasic  Vertebral direction of flow:        Antegrade         Antegrade  DUPLEX VELOCITIES (cm/sec)  CCA peak systolic                   140               108  ECA peak systolic                   166               203  ICA peak systolic                   75                150  ICA end diastolic                   17                41  PLAQUE MORPHOLOGY:                  Calcified with shadowing            Calcified with shadowing  PLAQUE AMOUNT:                      Mild to moderate on the right       Moderate to large  PLAQUE LOCATION:                    ICA, ECA          ICA, ECA   IMPRESSION:  1. Bilateral external carotid artery stenoses.  2. A 29% to 39% right internal carotid artery stenosis.  3. A 40% to 59% left internal carotid artery stenosis by new criteria.  4. Calcified plaque with shadowing bilaterally, could obscure more      severe stenoses.  5. Study essentially unchanged from 02/04/2006.   ___________________________________________  P. Liliane Bade, M.D.   DP/MEDQ  D:  02/24/2007  T:  02/25/2007  Job:  [id number]

## 2010-08-26 NOTE — Assessment & Plan Note (Signed)
OFFICE VISIT   [name], [name]  DOB:  09/04/1921                                       07/15/2009  ZOXWR#:[id number]   REASON FOR VISIT:  Syncope.   REFERRING PHYSICIAN:  Gerrit [name]. [name] Pates, MD, [facility].   PRIMARY CARE PHYSICIAN:  [name] A. Gerda Diss, MD.   HISTORY:  This is an 27-day-old gentleman I am seeing at the request of  Dr. Dietrich [name] for evaluation of syncopal episodes.  The patient first had  an episode approximately 2 years ago where he completely passed out.  He  has had 2 episodes since, the most recent of which was 1 month ago.  At  his most recent episode, he did not lose consciousness.  The episode  lasted approximately 8 hours.  When he was picked up by the ambulance,  he was having nausea with emesis as well as passing of stool.  There  were no aggravating or relieving factors.  He denies having symptoms of  numbness or weakness isolated to either extremity and no evidence of  amaurosis fugax.   The patient has a history of hypertension, which is medically managed.  He also suffers from having a history of an MI, having undergone a  coronary artery bypass graft.  He has COPD and congestive heart failure  as well, which are medically managed.   REVIEW OF SYSTEMS:  GENERAL:  Negative for fevers, chills, weight gain,  weight loss.  CARDIAC:  Positive shortness of breath when lying flat and shortness of  breath with exertion.  PULMONARY:  Positive for wheezing.  GI/GU/VASCULAR/NEURO:  Negative.  MUSCULOSKELETAL:  Positive for joint pain.  PSYCH:  Negative.  EENT:  Negative.  HEME:  Negative.   PAST MEDICAL HISTORY:  Hypertension, history of MI, congestive heart  failure, COPD/emphysema.   PAST SURGICAL HISTORY:  Coronary artery bypass graft.   FAMILY HISTORY:  Positive for cardiovascular disease at an early age in  his brother.   SOCIAL HISTORY:  He is married with 3 children.  He is retired.  Does  not smoke.  Has a history of smoking,  quit in 1975.  Does not drink  alcohol.   ALLERGIES:  PENICILLIN, SULFA, LEVAQUIN.   PHYSICAL EXAMINATION:  Vital signs:  Heart rate 62, blood pressure  134/55, O2 sats 97%.  Temperature is 98.4.  General:  Well-appearing in  no distress.  HEENT:  Within normal limits.  Lungs are clear bilaterally  without wheezes or rhonchi.  Cardiovascular:  Regular rate and rhythm.  No murmur.  No carotid bruits.  Abdomen:  Soft, nontender.  No  hepatosplenomegaly.  No pulsatile mass.  Musculoskeletal:  Without major  deformity.  Neuro:  No focal weaknesses or paresthesias.  Cranial nerves  II-XII are grossly intact.  Skin is without rash.   DIAGNOSTIC STUDIES:  The patient comes with Doppler study performed at  [facility].  This shows no significant extracranial carotid disease with  antegrade vertebral artery blood flow.   ASSESSMENT:  Syncopal episode x3.   PLAN:  I have reviewed the patient's ultrasound.  I do not feel that his  syncopal episode is related to extracranial carotid or vertebral artery  occlusive disease.  I have explained these findings to the patient and  told him that we can rule this out as a possible etiology, and I will  need to explore other etiologies for a syncopal episode.  I will plan on  seeing him back on a p.r.n. basis.     [name] Ny, MD  Electronically Signed   VWB/MEDQ  D:  07/15/2009  T:  07/16/2009  Job:  [id number]   cc:   [name] Picket A. Gerda Diss, MD  [name]. [name] Pates, MD, [facility]

## 2010-08-29 NOTE — H&P (Signed)
NAMEDETRELL, UMSCHEID                  ACCOUNT NO.:  [id number]   MEDICAL RECORD NO.:  [id number]          PATIENT TYPE:  INP   LOCATION:  A226                          FACILITY:  [facility]   PHYSICIAN:  Osvaldo Shipper, MD     DATE OF BIRTH:  03-Sep-1921   DATE OF ADMISSION:  06/07/2005  DATE OF DISCHARGE:  LH                                HISTORY & PHYSICAL   PRIMARY CARE PHYSICIAN:  Scott A. Gerda Diss, MD   ADMITTING DIAGNOSES:  1.  Syncopal episode likely vasovagal.  2.  History of coronary artery disease.  3.  Hypertension.  4.  Recent admission for pneumonia.  5.  Urinary tract infection.   CHIEF COMPLAINT:  Loss of consciousness.   HISTORY OF PRESENT ILLNESS:  The patient is an 77 year old Caucasian male  with a past medical history of coronary artery disease, hypertension, COPD,  leg swelling who was discharged from the hospital on May 02, 2005 after  being treated for right lower lobe pneumonia.  During that admission he also  developed bladder outlet obstruction requiring a Foley catheter which is  still in place today.  The patient went home, did well and yesterday he  presented to the ED because he had some blood in his urine.  The Foley was  irrigated in the ER and the patient was sent back home with a prescription  for Levaquin as he was found to have a evidence for a UTI.   The patient this morning was feeling quite better, had a good breakfast and  took his Levaquin.  About two hours afterwards he suddenly felt sweaty and  hot.  He felt like he wanted to go to the bathroom.  He stood up from a  sitting position, walking to his bathroom and he lost consciousness.  According to his wife, he was passed out for almost 5-10 minutes.  She  mentioned that he had stopped breathing for the first seconds.  Subsequently  started breathing and by the time EMS came he was awake.  The patient denied  any chest pain either before or after.  Currently he is mentioning some pain  below  his right arm-pit region which is described as sharp pain and gets  worse with cough or deep breathing.  Denied any focal weakness either before  or after the incident.  Denies any seizure-type activity.  There was no  stool incontinence as far as they know.  The patient denied any headaches  also with this episode.  Did not have any history of palpitations either.  After he woke up the patient was brought into the ER by the EMS.  In the ED  the patient had an episode of emesis.  He had about 10 episodes diarrhea  while being in the ED as well.  The diarrhea was described as watery,  possibly greenish in color but he is not sure.  No history of blood in the  stool.  His wife mentioned that when he threw up this morning after he  passed out there might have been some blood  in that.  According to the  daughter this was not so.  The patient gives a history of colonoscopy  recently, possibly back in January or December of 2006 which was reported as  being negative.  Actually there is a report of a colonoscopy done by Dr.  Karilyn Cota that shows colonic diverticulosis and no evidence for polyps,  external hemorrhoids were seen.   Currently the patient thinks he is completely back to his normal state.  He  has some cough but has improved.  Otherwise he is mainly concerned about the  diarrhea and the emesis.   MEDICATIONS AT HOME:  1.  Lasix 40 mg by mouth daily.  2.  Lisinopril 10 mg by mouth daily.  3.  Baby aspirin.  4.  Levaquin started yesterday 500 mg.  5.  Zocor 80 mg at bedtime.  6.  Terazosin 5 mg by mouth daily.  7.  B complex.  8.  Combivent MDI.   ALLERGIES:  1.  SULFA.  2.  PENICILLIN.  3.  BETA BLOCKERS (worsend his COPD).   PAST MEDICAL HISTORY:  History of hypertension, chronic obstructive  pulmonary disease, coronary artery disease, coronary artery bypass grafting  in 1996, history of leg swelling for which he is on Lasix, history of  pneumonia for which he was admitted in  January.   Underwent thoracentesis back in November 2006 for right sided pleural  effusion.   According to the previous records the patient had diarrhea back in January  which was also negative for c-diff.   The patient has a history of tremors in both the hands worse with movement  for which he is under evaluation by Dr. Gerda Diss.   SOCIAL HISTORY:  He lives in [place] with his wife and is independent  with his daily activities.  He quit smoking back in 1975 after having a 30-  pack-year of smoking.  No alcohol use.   FAMILY HISTORY:  History of tremors in the family.  History of colon cancer,  liver cancer.   REVIEW OF SYSTEMS:  A 10-point review of system was significant only for  tremors that have worsened over the past few months.  Otherwise quite  unremarkable.   PHYSICAL EXAMINATION:  VITAL SIGNS:  Temperature in the ER recorded as 97.4,  blood pressure 114/55, pulse rate 100 at this time, 125 in the ED.  Respiratory rate 16, saturating 100% on oxygen.  GENERAL EXAM:  An elderly white male, very pleasant to talk to in no  distress.  HEENT:  Some bruises over his face, on his nose, above lips.  Otherwise no  pallor , no icterus, oral membranes moist.  NECK:  Soft, supple.  No thyromegaly appreciated.  CARDIOVASCULAR:  S1/S2 normal, regular. No murmurs appreciated.  No S3/S4.  No rubs, no JVD.  No clicks, no bruits.  LUNGS:  Some crackles at the right lung base, otherwise pretty clear to  auscultation.  ABDOMEN:  Soft, nontender, nondistended.  Bowel sounds present.  No masses,  no organomegaly.  EXTREMITIES:  Reveal mild edema.  Peripheral pulses palpable.  CHEST WALL:  There is no palpable tenderness present over the right chest  wall region below his arm-pit.  NEUROLOGIC:  The patient is alert and oriented x3.  No focal deficits  appreciated.  Cranial nerves are intact.  Strength is equal bilaterally 5/5. Pronator drift is not present.  Finger-to-nose normal.  Gait  was not tested.  The patient does have resting tremors more so on  the right upper extremities  greater than the left which increase when the patient moves his hand or has  to reach out.   LABORATORY DATA:  CBC today shows white count 17.5 with neutrophilia, no  bands reported so far.  Hemoglobin 10.1 which is close to his baseline,  hematocrit 29, MCV 36, platelet count 342,000.  ABG was done and showed a pH  of 7.32, pCO2 41, pO2 82 and this was on oxygen.  D-dimer was positive at  2.45.  BMP was done that showed a glucose 152, albumin 2.9 other parameters  all negative or normal.  Cardiac enzymes negative x2 so far.  N-peptide 160.  UA from yesterday showed brown cloudy urine, small bilirubin, trace ketones,  large blood, protein was noted, positive for nitrites and leukocytes, many  bacteria was seen.  Microscopic was not done.   ADMITTING STUDIES:  Chest x-ray which has reported showing improvement in  right lower lobe pneumonia, right pleural fluid.  Stable changes of COPD.   The patient had a CT of the chest with contrast not showing pulmonary  embolism.  T   he patient also had a CAT scan of the head which did not show any acute  intracranial findings or chronic mitral vascular disease noted.   EKG was done which showed sinus rhythm with a normal axis, no real Q waves  identified.  Intervals all appeared to be within normal range.  No other  concerning ST or T wave changes noted on the EKG.   IMPRESSION:  This is an 77 year old male with medical problems as outlined  before who presents with a syncopal episode.  The differential diagnosis  include a vasovagal reaction.  Some kind of reaction possibly to Levaquin.  This could also could have been a TIA though it is very less likely.  A  cardiac event is also possible especially arrhythmia, however, the patient  did not have any symptoms either before or after.   PLAN:  1.  Syncope.  We will hold his Levaquin for now, put him  on ceftriaxone .      We will admit him to telemetry, monitor arrhythmia.  Check      echocardiogram, carotid Doppler's. We will also check orthostatic's on      him.  CT was negative, he does not have focal signs so I will not order      MRI at this time.  The patient does have an elevated white count so he      could be septic which could be another reason.  I will cover him with      ceftriaxone, do a blood culture, urine culture.  PE has been ruled out.  2.  History of coronary artery disease:  Continue aspirin, lisinopril for      this for now.  The patient is contraindicated to use beta blockers      because of the COPD.  3.  Urological problems:  The patient has an indwelling Foley because of his      bladder outlet obstruction issues.  I am consulting Dr. Jerre Simon to look      at this to see if he can discontinue the Foley.  4.  Deep vein thrombosis prophylaxis will be given.  Continue Zocor.  I will     hold his Lasix at least for today as he could be orthostatic.  5.  Physical therapy will also be consulted.  6.  Stool studies for his  diarrhea, although they have been negative in the      past.  I will give him Imodium for this.   Further management decisions will be based on results of testing and  patient's response to treatments.      Osvaldo Shipper, MD  Electronically Signed     GK/MEDQ  D:  06/07/2005  T:  06/07/2005  Job:  [id number]   cc:   Lorin Picket A. Gerda Diss, MD  Fax: [phone]   Ky Barban, M.D.  Fax: [phone]

## 2010-08-29 NOTE — Consult Note (Signed)
[name], [name] NO.:  [id number]   MEDICAL RECORD NO.:  [id number]          PATIENT TYPE:  INP   LOCATION:  A226                          FACILITY:  [facility]   PHYSICIAN:  Vida Roller, M.D.   DATE OF BIRTH:  [birth year]   DATE OF CONSULTATION:  06/08/2005  DATE OF DISCHARGE:                                   CONSULTATION   CARDIOLOGY CONSULTATION:   PRIMARY CARE PHYSICIAN:  Dr. Lilyan Punt   CARDIOLOGIST:  Dr. Goldfield Bing   DATE OF CONSULTATION:  June 08, 2005   HISTORY OF PRESENT ILLNESS:  Mr. Bailon is an 77 year old man who has coronary  artery disease status post bypass surgery in 1995 who had a single episode  of loss of consciousness.  It appears that he has been having some trouble  with his bladder and has a tube Foley catheter with a leg bag.  He noticed  some blood on Saturday, went to the emergency department and was evaluated  and was given Levaquin.  It appears that he has had a previous problem with  Levaquin in the past in that he had nausea associated with its use  previously, however, he did not think he was allergic to it so he took the  medication.  About 2 hours after he took the medication, he had the sudden  onset of rectal tenesmus feeling like he had to go to the bathroom.  On the  way to the bathroom, he lost consciousness, fell from his own height, struck  his face, has some contusions on his face.  His wife came just as he fell  and noticed that he had stopped breathing for a short period of time.  She  called 911 and by the time they arrived he was awake and alert.  He had the  onset of severe nausea and vomiting.  He was excessively vomiting and  excessively having diarrhea throughout the period after the episode of loss  of consciousness.  He denies any chest pain, no shortness of breath, he was  not postictal after the event, did not have any palpitations.  This is the  first time he has ever lost consciousness.  He  has no chest pain since his  bypass surgery and actually has been doing very well from a cardiovascular  standpoint.   PAST MEDICAL HISTORY:  His past medical history is significant for coronary  artery disease.  He is status post bypass surgery in 1995 with the LIMA to  his LAD, jump graft to a diagonal, saphenous vein graft to his obtuse  marginal, saphenous vein graft to his right coronary artery.  He had a  stress Cardiolite done in 2003 which showed no evidence of ischemia or scar  and a normal ejection fraction.  He does have carotid disease and he is  status post an evaluation last year which shows a 40-69% left internal  carotid stenosis.  He has hypertension, hyperlipidemia, benign essential  tremor, COPD, was admitted back in January with a right lower lobe pneumonia  and I think  that is when he was treated with the Levaquin previously.  History of bladder outlet obstruction.  He is in the process of being  evaluated for that.  He has a history of a villous adenoma in his rectum  back in 1983 and he has had a recent total colonoscopy which shows no  evidence of polyps.  He does have some diverticulosis.  His most recent  echocardiogram is from November, it shows normal LV systolic function and  left ventricular hypertrophy but no other significant findings other than  left atrial enlargement.   MEDICATIONS PRIOR TO ADMISSION:  1.  Lasix 40 mg once a day.  2.  Lisinopril 10 mg once a day.  3.  Aspirin 81 mg once a day.  4.  Levaquin 500 mg once a day and that was stopped when he was admitted to      the hospital.  5.  Zocor 80 a day.  6.  Terazosin 5 a day.  7.  Vitamin B12 Complex.  8.  Combivent inhaler.   In the hospital, he is on aspirin 81 once a day; Rocephin 1 g once a day;  Lovenox DVT prophylaxis dose; lisinopril 10 mg a day; multivitamin once a  day; Protonix 40 mg a day; Zocor 80 mg a day and IV normal saline at 100  mL/hr.   SOCIAL HISTORY:  He lives in  [place] with his wife.  She was there and  helped with some of the information on his history.  He is retired from [employer].  He is married.  He has three kids.  He used to smoke but he quit in  1980.  He does not use any illicit drugs or drink any alcohol.   FAMILY HISTORY:  His mother died at age 5 of colon cancer.  Father is  unknown.  He has a brother who has colon cancer and is alive in his 51s.   REVIEW OF SYSTEMS:  He has had fever and chills and sweats just prior to the  episode of loss of consciousness.  No weight change or adenopathy.  No  headache, sinus tenderness, sinus discharge, voice change or vertigo.  No  rashes or lesions.  No chest pain.  No shortness of breath.  No dyspnea on  exertion.  No orthopnea.  No PND.  No edema.  No palpitations.  He did have  the syncope but no claudication, cough, or wheezing.  He does have dysuria,  frequency, and some urgency related to the catheter.  No weakness or  numbness.  No myalgias or joint swelling.  No nausea, vomiting, diarrhea, or  melena prior to the event.  No polyuria or polydipsia and the remainder of  his review of systems is negative.   PHYSICAL EXAMINATION:  GENERAL:  He is a well-developed, well-nourished  white male in no apparent distress.  He is alert and oriented x4.  VITAL SIGNS:  His pulse is 76, his respiratory rate is 19 and his blood  pressure is 116/54.  Orthostatic blood pressures do show that he does have  orthostasis with significant drop in his blood pressure when he stands from  125 systolic down to 161 systolic with heart rates that go from 107 to 115.  HEENT:  Exam is unremarkable.  NECK:  His neck is supple.  I do not appreciate any bruits.  There is no  jugular venous distension.  CARDIOVASCULAR:  Exam is regular.  There is no murmur noted.  He has a well-  healed median sternotomy scar.  Point of maximal impulse is not displaced. First and second heart sounds sound normal.  LUNGS:  His  lungs are clear to auscultation.  SKIN:  His skin is without rashes.  BREAST/GU AND RECTAL:  Exams are all deferred.  ABDOMEN:  His abdomen is soft, nontender, with normoactive bowel sounds.  EXTREMITIES:  His lower extremities are without significant clubbing,  cyanosis, or edema.  His pulses are 1+.  He does have a leg bag with a Foley  catheter in place.  NEUROLOGIC:  Exam is grossly nonfocal.   STUDIES:  He had a chest CT which showed no significant pulmonary embolus.  He does have the right lower lobe atelectasis with consolidation and stable  COPD with a hiatal hernia.  Chest x-ray shows the right lower lobe pneumonia  is better.  He does have some small right pleural effusion.  His head CT was  normal.  Electrocardiogram shows sinus rhythm at a rate of 92 with normal  axes, normal intervals, no ischemic ST-T wave changes and no Q waves  concerning for an old myocardial infarction.  His QT corrected was 438.   His admission labs show a white blood cell count of 10.7, H&H of 8 and 26  with a platelet count of 310.  Sodium 134, potassium is 4.2, chloride 105,  bicarb 23, BUN 23, creatinine 1.5 and his blood sugar is 87.  Liver function  studies are all normal.  Three sets of cardiac enzymes are negative.  His B-  type natriuretic peptide is 160.  His D-dimer was 2.45.  Urinalysis shows  white blood cells, red blood cells, bacteria, protein.   ASSESSMENT:  This is a gentleman who had an episode of syncope which sounds  very vasovagal to me and has had no arrhythmia on the monitors, cardiac  enzymes are negative, his electrocardiogram looks pretty good.  He has a  history of normal left ventricular systolic function, he does have carotid  stenosis by history and he is orthostatic by exam.  He does have coronary  artery disease which appears to be quiescent and secondary prophylaxis is  aggressively attended to by my partner, Dr. [place] Bing, so I doubt that  he has progression of  his coronary disease.   PLAN:  We would recommend an echocardiogram to ensure that his LV systolic  function is normal.  He probably needs carotid Dopplers to ensure that his  carotid axis is normal.  I would probably hydrate him gently and consider  potentially holding his lisinopril if his blood pressure continues to be  this low.  Other than that I do not think there is much more to do for this  gentleman other than to have him avoid the Levaquin which appears to be a  medication he does not tolerate.      Vida Roller, M.D.  Electronically Signed     JH/MEDQ  D:  06/08/2005  T:  06/08/2005  Job:  [id number]   cc:   Lorin Picket A. Gerda Diss, MD  Fax: [phone]

## 2010-08-29 NOTE — Consult Note (Signed)
[facility]  Patient:    [name], [name] Visit Number: [id number] MRN: [id number]          Service Type: OUT Location: RAD Attending Physician:  Rosalyn Charters Dictated by:   Barbaraann Barthel, M.D. Proc. Date: 03/19/01 Admit Date:  03/20/2001   CC:         Elfredia Nevins, M.D.   Consultation Report  Surgery was asked to see this 77 year old male who had fallen and hit a cement block with his forehead leaving a laceration approximately 7 cm in length. Dr. Sherwood Gambler had seen him and cleared him from a medical point of view.  He had no loss of consciousness or other associated injuries.  His CT scan of the head was normal.  Treatment rendered:  The wound was cleansed and under local anesthesia with 1% Xylocaine without epinephrine the wound was approximated with 6-0 Prolene with interrupted sutures.  Steri-Strips dressing was applied and the patient will be discharged by the medical service.  I have made follow-up arrangements to remove his sutures in seven days time.  Told him to clean his wound with alcohol daily and apply a little Neosporin on the wound and to refrain from taking his aspirin for approximately one week. Dictated by:   Barbaraann Barthel, M.D. Attending Physician:  Rosalyn Charters DD:  03/19/01 TD:  03/19/01 Job: [id number]

## 2010-08-29 NOTE — H&P (Signed)
[name], [name]                  ACCOUNT NO.:  [id number]   MEDICAL RECORD NO.:  [id number]          PATIENT TYPE:  AMB   LOCATION:                                FACILITY:  [facility]   PHYSICIAN:  Lionel December, M.D.    DATE OF BIRTH:  [birth year]   DATE OF ADMISSION:  DATE OF DISCHARGE:  LH                                HISTORY & PHYSICAL   PRIMARY CARE PHYSICIAN:  Scott A. Gerda Diss, M.D.   CHIEF COMPLAINT:  Surveillance colonoscopy.   HISTORY OF PRESENT ILLNESS:  [name] is an 77 year old Caucasian male who  has a history of adenomatous colon polyp.  He was first diagnosed in 1983  with a villous adenoma of the rectal region.  His last colonoscopy was  January 18, 2001.  He had multiple diverticula in the sigmoid and descending  colon, multiple small polyps in the rectum which were hyperplastic, and  small external hemorrhoids.  He is due for a repeat exam.  He has a family  history of a mother diagnosed with colon cancer at age 67 and deceased soon  after, and a brother diagnosed with colon cancer at age 22 who is still  alive and well in his 7s.  [name] states he has been doing well.  He did  have some right upper quadrant, right mid abdominal pain that radiated down  from his rib cage.  He had a fairly extensive workup through Dr. Fletcher Anon  office which included an abdominal and pelvic CT on January 02, 2005.  It  did show a small right pleural effusion with overlying atelectasis but  nothing explaining his abdominal pain.  He also had abdominal ultrasound  which was negative.  He had a HIDA scan, on January 12, 2005, which was  normal.  His gallbladder ejection fraction was 63%.  He then, on February 20, 2005, had an ultrasound guided thoracentesis of over 1300 cc of fluid  from the right pleural space.  [name] notes that after his thoracentesis,  his abdominal pain went away.  He has no longer had any further abdominal  pain.  He denies any rectal bleeding or melena.  He  has a daily soft brown  bowel movement, occasionally has diarrhea especially with green leafy  vegetables and roughage.  He denies any constipation.  He denies any  anorexia or early satiety.  He denies any dysphagia, odynophagia, heartburn,  indigestion, nausea or vomiting.  His weight has remained stable.   PAST MEDICAL HISTORY:  1.  Diverticulitis.  2.  Recent thoracentesis as described in HPI.  3.  Hypertension.  4.  BPH.  5.  Hypercholesterolemia.  6.  CABG in 1995 with coronary artery disease.  7.  History of villous adenoma of the rectum in 1983.      1.  Last colonoscopy as described in the HPI.   CURRENT MEDICATIONS:  1.  Zocor 80 mg daily.  2.  Terazosin 5 mg daily.  3.  Aspirin 81 mg daily.  4.  Multivitamin daily.  5.  Combivent p.r.n.  6.  Lasix 20 mg daily.  7.  Lisinopril 10 mg daily.   ALLERGIES:  1.  PENICILLIN.  2.  SULFA.   FAMILY HISTORY:  Positive for mother and brother with colon carcinoma, see  HPI.   SOCIAL HISTORY:  [name] has been married for 55 years.  He has three grown  healthy children.  He is retired from [employer].  He has a 20-pack year  history of tobacco use.  Denies alcohol or drug use.   REVIEW OF SYSTEMS:  CONSTITUTIONAL:  Denies any fatigue, denies any fever or  chills.  See HPI.  CARDIOVASCULAR:  Denies any chest pain or palpitations.  PULMONARY:  He did have significant shortness of breath, although most of  this has resolved since his thoracentesis.  Denies any chronic cough or  hemoptysis.  GI:  See HPI.   PHYSICAL EXAMINATION:  VITAL SIGNS:  Weight 199.5 pounds, height 70.5  inches, temp 98.5, blood pressure 132/60, and pulse 68.  GENERAL:  [name] is an 77 year old, elderly, Caucasian male who is alert,  oriented, and pleasant, cooperative, no acute distress.  HEENT:  __________  .  Conjunctivae pink.  Oropharynx pink and moist without  lesion.  NECK:  Supple without mass or thyromegaly.  CHEST:  Regular rate and  rhythm.  Normal S1 S2 without murmurs, clicks,  rubs, or gallops.  LUNGS:  Inspiratory wheezes throughout.  He does have decreased breath  sounds at the right base.  No acute distress.  ABDOMEN:  Positive bowel sounds x4.  No bruits auscultated.  Soft,  nontender, nondistended.  Liver is palpable one finger breadth below the  right costal margin.  Unable to palpate splenomegaly.  No rebound tenderness  or guarding.  EXTREMITIES:  With 1+ bilateral lower extremity edema, especially around the  ankles.  He does have clubbing.  SKIN:  Pink, warm, and dry without any rash or jaundice.   LABORATORY STUDIES:  From Dr. Fletcher Anon office, December 25, 2004, WBC of  10.2, hemoglobin 11.1, hematocrit 35, and platelets 267.  LFTs were normal.  Amylase 26.   IMPRESSION:  1.  [name] is an 77 year old Caucasian male with a history of villous      adenoma of the rectum in 1983.  He also has a strong family history of      colon cancer in both his mother and brother.  He is due for a repeat      surveillance exam at this time.  2.  He also has microcytic anemia as well.  I am unsure as to whether this      has been worked up or not.  Nonetheless, we need to screen for      colorectal carcinoma.  3.  As far as his right upper quadrant pain is concerned, this has resolved      post thoracentesis and he is feeling much better, denies any pain.  I      suspect it was related to the pleural effusion.   PLAN:  1.  We will schedule colonoscopy with Dr. Karilyn Cota in the future.  I discussed      the procedure including risks and benefits  which included, but are not limited to, bleeding, infection, perforation,  and drug reaction.  He agrees with this plan.  Consent will be obtained.  He  is to hold his aspirin three days prior to the procedure.  1.  Further recommendations pending colonoscopy.  [name], N.P.      Lionel December, M.D. Electronically Signed    KC/MEDQ  D:   03/18/2005  T:  03/18/2005  Job:  [id number]   cc:   Lorin Picket A. Gerda Diss, MD  Fax: [phone]

## 2010-08-29 NOTE — Consult Note (Signed)
[name], [name]                  ACCOUNT NO.:  [id number]   MEDICAL RECORD NO.:  [id number]          PATIENT TYPE:  INP   LOCATION:  A226                          FACILITY:  [facility]   PHYSICIAN:  Ky Barban, M.D.DATE OF BIRTH:  Feb 06, 1922   DATE OF CONSULTATION:  06/08/2005  DATE OF DISCHARGE:                                   CONSULTATION   CHIEF COMPLAINT:  Urinary retention.   HISTORY OF PRESENT ILLNESS:  This is an 77 year old male who was admitted by  Dr. Rito Ehrlich.  He has had some kind of vasovagal or syncopal episode.  He is  doing fine.  Has history of coronary artery disease, hypertension and  recently had an episode of pneumonia. I have seen him during that visit in  the hospital.  He was in urinary retention.  We left the Foley catheter in.  He was waiting to have urological workup, but he developed this syncopal  episode, so he was brought back into the hospital.  The patient tells me  that he has symptoms of prostatism, __________, nocturia before we inserted  the Foley catheter on his last visit.  As far as his syncopal episode is  concerned, he says that he has recovered from it.  Recently, he was started  on Levaquin.  He thinks there was an allergic reaction from that.   MEDICATIONS:  1.  Lasix 40 mg p.o. daily.  2.  Lisinopril 10 mg daily.  3.  Baby aspirin once daily.  4.  Zocor.  5.  Prazosin 5 mg p.o. daily.  6.  Multivitamin.   ALLERGIES:  SULFA, PENICILLIN, BETA BLOCKERS.   PAST MEDICAL HISTORY:  1.  Hypertension.  2.  COPD.  3.  Coronary artery disease.  4.  Bypass grafting in 1996.  5.  History of swelling in his leg for which he takes Lasix.  6.  No history of prostate cancer.   PHYSICAL EXAMINATION:  GENERAL APPEARANCE:  Moderately built, not in any  acute distress.  VITAL SIGNS:  Blood pressure 120/80, temperature normal.  ABDOMEN:  Soft, flat.  Liver, spleen and kidneys are not palpable.  No CVA  tenderness.  EXTERNAL GENITALIA:   Circumcised meatus __________.  Testicles are normal.  Foley catheter in place.  RECTAL:  Prostate 1-1/2+, smooth and firm.   IMPRESSION:  1.  Benign prostatic hypertrophy.  2.  Hypertension.  3.  Coronary artery disease.   LABORATORY DATA:  CBC:  WBC 17.5.   PLAN:  I will go ahead and plan to do cystoscopy while he is here in the  hospital.      Ky Barban, M.D.  Electronically Signed     MIJ/MEDQ  D:  06/08/2005  T:  06/09/2005  Job:  [id number]

## 2010-08-29 NOTE — Procedures (Signed)
[facility]  Patient:    [name], [name] Visit Number: [id number] MRN: [id number]          Service Type: OUT Location: RAD Attending Physician:  Mirian Mo Dictated by:   Joellyn Rued, P.A.-C. Proc. Date: 08/15/01 Admit Date:  08/15/2001   CC:         Lilyan Punt, M.D.   Stress Test  DATE OF BIRTH:  1921/07/09  PROCEDURE:  Stress Cardiolite test  CARDIOLOGIST:  Ashley Bing, M.D.  REFERRING PHYSICIAN:  Dr. Gerda Diss.  INDICATIONS:  [name] is a 77 year old white male who was referred back to our office on August 09, 2001.  He was seen by Jacolyn Reedy, P.A.-C. and by Dr. Dietrich Pates, in regards to dyspnea on exertion.  Elon Jester performed a stress test in the office, utilizing the Bruce protocol, and 2 mm ST segment depression inferolaterally was noted, as well as decreased O2 saturations, thus he was arranged for a stress Cardiolite.  His history is notable for a four-vessel bypass grafting in 1995.  He had normal LV function.  He received a LIMA to the LAD and diagonal saphenous vein graft to the OM, saphenous vein graft to the RCA.  He quit smoking about 20 years ago.  He also has a history of hypertension and hyperlipidemia.  DESCRIPTION OF PROCEDURE:  A resting electrocardiogram showed normal sinus rhythm, early R-wave progression, blood pressure 160/70.  Utilizing the Bruce protocol he ambulated for a total of 6 minutes and 38 seconds.  The maximum heart rate was 156, maximum blood pressure was 218/6. He achieved 109% predicted maximum heart rate and 7.0 METS.  At approximately four minutes, he began complaining of some shortness of breath.  He denied any chest discomfort.  It was noted that he did have approximately 1.0 mm ST segment depression inferolaterally at the beginning of stage 2.  This resolved immediately in recovery.  Final report and images are pending Dr. Lucianne Muss review. Dictated by:   Joellyn Rued,  P.A.-C. Attending Physician:  Mirian Mo DD:  08/15/01 TD:  08/15/01 Job: [id number]

## 2010-08-29 NOTE — Group Therapy Note (Signed)
NAMEMEGHAN, TIEMANN                  ACCOUNT NO.:  [id number]   MEDICAL RECORD NO.:  [id number]          PATIENT TYPE:  INP   LOCATION:  A339                          FACILITY:  [facility]   PHYSICIAN:  Margaretmary Dys, M.D.DATE OF BIRTH:  1921-10-02   DATE OF PROCEDURE:  04/25/2005  DATE OF DISCHARGE:                                   PROGRESS NOTE   SUBJECTIVE:  The patient feels much better today.  He says he is less  congested, much less coughing and wheezing appears to have improved.   He denies any fevers or chills.  The patient was seen with his wife on his  way to having a CT scan of his chest.  The patient continues to have some  pleuritic chest pain, especially on the right side.  He otherwise feels  okay, feels better and did sleep fairly well overnight.   OBJECTIVE:  Conscious, alert, comfortable, in mild respiratory distress, the  patient was speaking in interrupted sentences due to dyspnea.   Vital signs:  Blood pressure was 118/78, pulse 78, respirations 20, T max  97.7, oxygen saturation 94% on 3 L.  HEENT:  Normocephalic, atraumatic.  Oral mucosa moist.  No exudates were noted.  Neck:  Supple.  No JVD or  lymphadenopathy.  Lungs:  The patient had crackles over the right base with  reduced air entry.  Left lung was otherwise clear.  The patient had no end  respiratory wheezing or rhonchi.  Heart:  S1 and S2 regular.  No S3 or S4,  gallops or rubs.  Abdomen:  Soft, nontender.  Bowel sounds positive.  No  masses palpable.  CNS:  Exam was stable with no focal deficits.   Laboratory data:  White blood cell count 41.4, hemoglobin 9.3, hematocrit  26.4, platelet count was 229, neutrophils of 87%.  Sodium 124, potassium  3.3, chloride 99, CO2 22, glucose 166, BUN 65, creatinine 2.6.   A CT scan of the chest to rule out possibility of mass only shows right  lower lobe consolidation with no evidence of endobronchial mass.   ASSESSMENT AND PLAN:  Mr. Mackins is an 77 year old  Caucasian male admitted  with right lower lobe pneumonia.  Overall, the patient feels much better.  His oxygenation has improved, and he has had no fever.  He still has  significant leukocytosis.  His CT scan does not suggest any evidence of  endobronchial mass, though this will need to be followed once the infiltrate  has improved.  I will continue him on his current intravenous antibiotics  consisting of ceftriaxone 1 g IV daily and Zithromax 250 mg p.o. once a day.  I will institute DVT prophylaxis with Lovenox.   We will replace potassium.   We will continue him on all his previous medications.  I have explained the  above plan to the patient, and he verbalized full understanding.      Margaretmary Dys, M.D.  Electronically Signed     AM/MEDQ  D:  04/25/2005  T:  04/25/2005  Job:  [id number]

## 2010-08-29 NOTE — Group Therapy Note (Signed)
NAMESELIG, WAMPOLE                  ACCOUNT NO.:  [id number]   MEDICAL RECORD NO.:  [id number]          PATIENT TYPE:  INP   LOCATION:  A339                          FACILITY:  [facility]   PHYSICIAN:  Margaretmary Dys, M.D.DATE OF BIRTH:  October 31, 1921   DATE OF PROCEDURE:  04/26/2005  DATE OF DISCHARGE:                                   PROGRESS NOTE   PROGRESS NOTE:   SUBJECTIVE:  The patient feels much better today.  He continues to improve.  He denies any fever or chills.  He is coughing up some yellowish stuff, but  much less now.  He denies any chest pain, either angina or pleuritic.  He  also slept fairly well overnight.  He took a shower this morning really  feels better.   OBJECTIVE:  GENERAL:  Conscious, alert, comfortable, in mild respiratory  distress.  The patient was well oriented to time, place and person.  VITAL SIGNS: Blood pressure 125/61, pulse 97, respirations 24, temperature  98.9.  He had a temperature of 101.4 last night. Oxygen saturation 95% on 2  L.  HEENT: Normocephalic, atraumatic.  Oral mucosa moist.  No exudates.  NECK:  Supple.  No JVD or lymphadenopathy.  LUNGS:  Crackles over the right base with reduced air entry.  Left lung was  otherwise clear with no end-expiratory wheezing or rhonchi.  HEART:  S1 and S2, regular.  No S3 or S4, gallops or rubs.  ABDOMEN:  Soft, nontender. Bowel sounds positive.  No mass palpable.  CNS:  Stable with no focal deficits.   LABORATORY DATA:  White blood cell count significantly improved down to  25.8.  Hemoglobin 9.5, hematocrit 27.1, platelet count 268.  Neutrophils  90%.  Sodium 137, potassium 4.1, chloride 101, CO2 25, glucose 164, BUN 49,  creatinine 2.   ASSESSMENT/PLAN:  1.  Right lower lobe pneumonia.  The patient's congestion is improved.      Fever is better today.  White count is also down.  We will continue on      ceftriaxone and Zithromax.  2.  Acute on chronic renal insufficiency.  This is improving.  This  is      likely secondary to prerenal azotemia.  Will continue with fluid      therapy.  3.  Disposition.  The patient overall is improving and will continue on      current plan.  The patient is also getting      p.r.n. nebulizers.  4.  Deep venous thrombosis prophylaxis with Lovenox.  5.  Gastrointestinal prophylaxis with Protonix.      Margaretmary Dys, M.D.  Electronically Signed     AM/MEDQ  D:  04/26/2005  T:  04/26/2005  Job:  [id number]

## 2010-08-29 NOTE — Op Note (Signed)
NAMEJOVONTE, [name]                  ACCOUNT NO.:  [id number]   MEDICAL RECORD NO.:  [id number]          PATIENT TYPE:  AMB   LOCATION:  DAY                           FACILITY:  [facility]   PHYSICIAN:  Lionel December, M.D.    DATE OF BIRTH:  June 05, 1921   DATE OF PROCEDURE:  04/09/2005  DATE OF DISCHARGE:                                 OPERATIVE REPORT   PROCEDURE:  Colonoscopy.   INDICATIONS:  [name] is an 77 year old Caucasian male who is undergoing  surveillance exam. He has had colonic polyps. His last exam was in October  2002. Family history is positive for colon carcinoma in his mother and two  brothers. He was recently evaluated for right upper quadrant pain, but since  he had thoracenteses, the pain has completely resolved. Procedure and risks  were reviewed with the patient, and informed consent was obtained.   MEDICINES FOR CONSCIOUS SEDATION:  Demerol 25 mg IV, Versed 4 mg IV.   FINDINGS:  Procedure performed in endoscopy suite. The patient's vital signs  and O2 saturation were monitored during the procedure and remained stable.  The patient was placed in left lateral position. Rectal examination  performed. No abnormality noted on external or digital exam. Olympus  videoscope was placed in rectum and advanced under vision into sigmoid colon  and beyond. Preparation was satisfactory. Very tortuous sigmoid colon with  scattered diverticula. Slowly and carefully, scope was passed into  descending colon where there were a few more diverticula. Once past the  splenic flexure, scope was then advanced easily into the hepatic flexure and  with some difficulty into cecum. Cecum was identified by ileocecal valve and  appendiceal orifice. Pictures taken for the record. As the scope was  withdrawn, colonic mucosa was carefully examined and was normal. Rectal  mucosa similarly was normal. Scope was retroflexed to examine anorectal  junction, and small hemorrhoids were noted below the dentate  line. Endoscope  was straightened and withdrawn. The patient tolerated the procedure well.   FINAL DIAGNOSIS:  1.  Left colonic diverticulosis.  2.  No evidence of recurrent polyps.  3.  External hemorrhoids.   RECOMMENDATIONS:  High-fiber diet. He should continue yearly Hemoccults and  may consider next exam in 5 years from now.      Lionel December, M.D.  Electronically Signed     NR/MEDQ  D:  04/09/2005  T:  04/09/2005  Job:  [id number]   cc:   Lorin Picket A. Gerda Diss, MD  Fax: [phone]

## 2010-08-29 NOTE — Discharge Summary (Signed)
NAMEJULIUS, [name]                  ACCOUNT NO.:  [id number]   MEDICAL RECORD NO.:  [id number]          PATIENT TYPE:  INP   LOCATION:  A339                          FACILITY:  [facility]   PHYSICIAN:  Scott A. Gerda Diss, MD    DATE OF BIRTH:  03/05/1922   DATE OF ADMISSION:  04/24/2005  DATE OF DISCHARGE:  01/20/2007LH                                 DISCHARGE SUMMARY   DISCHARGE DIAGNOSES:  1.  Right lower lobe pleural effusion.  2.  Right lower lobe pneumonia.  3.  Reactive airway.  4.  Hypoxia due to above.  5.  Bladder outlet obstruction.   HOSPITAL COURSE:  The patient was admitted in with high fevers, cough,  shortness of breath. Was found to be hypoxic with right lower lobe pneumonia  and a pleural effusion. The patient was treated with IV antibiotics over the  course of the weekend into the early part of the week. Continued to have  significant problem with shortness of breath, congestion, coughing,  wheezing, treated with nebulizer treatments, required O2. Levaquin was added  to the Rocephin. The patient did have some diarrhea secondary to the  medication, but he seemed to get better with this. He did have bladder  outlet obstruction and had to have a catheter inserted. Urology was  consulted to see the patient, and they recommended to leave the catheter in  for the next two weeks, and they will see him as an outpatient. The patient  had a followup x-ray done as well. It still showed ongoing effusion and  right lower lobe pneumonia. It should be noted that the patient did come in  slightly dry, was given IV fluids, and then went in other direction and had  to be given some Lasix, but that cleared up as a problem, and he is not in  failure at this time. On discharge, the patient was in good condition with  some diminished breath sounds in the base at the time of discharge, moving  air much better, not having any wheezing. He is to use Atrovent/albuterol  combination per nebulizer  q.i.d., more often if necessary. Follow up in the  office mid week. His O2 saturation was 92%. He does not need oxygen. He will  be sent hom on Levaquin 500 mg daily for 10 days. We will do a follow up  chest x-ray in approximately two weeks, sooner if problems.      Scott A. Gerda Diss, MD  Electronically Signed     SAL/MEDQ  D:  05/02/2005  T:  05/02/2005  Job:  [id number]

## 2010-08-29 NOTE — H&P (Signed)
[name], [name]                  ACCOUNT NO.:  [id number]   MEDICAL RECORD NO.:  [id number]          PATIENT TYPE:  INP   LOCATION:  A223                          FACILITY:  [facility]   PHYSICIAN:  Scott A. Gerda Diss, MD    DATE OF BIRTH:  03-28-1922   DATE OF ADMISSION:  04/24/2005  DATE OF DISCHARGE:  LH                                HISTORY & PHYSICAL   CHIEF COMPLAINT:  Cough, shortness of breath.   HISTORY OF PRESENT ILLNESS:  This is an 77 year old white male who has had  significant problems lately with coughing, congestion, wheezing, has been  seen multiple different times over the past year with COPD symptoms.  Over  the holidays, had to go to the emergency department because of swelling in  the legs.  They did a chest x-ray, did not show any edema.  It showed  cardiomegaly and it showed a chronic pleural effusion.  He followed up, on  April 17, 2005, and at that point in time, we increased his Lasix from one  to two q.a.m. and he states he did pretty well the rest of the week but then  on Monday, April 20, 2005, he started noticing some diarrhea and on Tuesday  increased coughing, congestion, fever, chills, and he states he just wanted  to try to tough it out cause he thought he was getting better and then he  noticed on Friday he was feeling worse, more shortness of breath, nausea,  not feeling good and decided to call and when he called it was noted that  the patient was having significant cough, congestion, weakness.  He was  instructed to get an O2 sat along with chest x-ray and O2 sat came out at  88% and the chest x-ray showed a right lower lobe pneumonia, and he was  admitted into the hospital at that point in time.   PAST MEDICAL HISTORY:  1.  History of cardiomegaly.  2.  Irregular heart beat.  3.  Hyperlipidemia.  4.  Coronary artery disease.  5.  COPD.  6.  Benign essential tremor.  7.  Horseshoe kidney.   He is married, has children.   FAMILY HISTORY:   Colon cancer, hypertension, hyperlipidemia.   SURGICAL BACKGROUND:  1.  Coronary artery bypass.  2.  Cardiolite in 2004.  3.  Problems with rectal polyps in the past but no tumors.   MEDICATIONS:  1.  Lisinopril 10 mg one daily.  2.  Furosemide 20 mg two q.a.m.  3.  Zocor 80 mg daily.  4.  Terazosin 5 mg q.h.s.  5.  Aspirin 81 mg daily.  6.  Combivent p.r.n.  7.  Vitamin one daily.   REVIEW OF SYSTEMS:  Negative for headaches, blurred vision, excessive  thirst.  Positive for coughing, congestion, shortness of breath.  Negative  for chest pressure or tightness, negative for abdominal pain, negative for  bloody stools.   PHYSICAL EXAMINATION:  HEENT:  TMs NLT.  NECK:  Supple.  No masses are felt.  LUNGS:  Have bilateral clear lungs in the  upper lung fields but has  significant right lower lobe rales and rhonchi.  Not in respiratory  distress.  HEART:  Regular.  ABDOMEN:  Soft.  SKIN:  Warm dry.  NEUROLOGIC:  Grossly normal.   Chest x-ray shows a right lower lobe pneumonia.  Blood work is pending.   ASSESSMENT/PLAN:  Pneumonia with documented weight loss of approximately 10  pounds since last being seen.  I think some of that weight loss is due to  increase of Lasix but I think also some due to sickness recently.  This  pneumonia looking back on his x-rays, he had a pleural effusion back in  November that had to be tapped, that helped him out greatly but this pleural  effusion does not look enough to be causing this.  But he does have a right  lower lobe pneumonia and we need to make sure that there is not something  hiding behind the heart that could be triggering this problem.  We will go  ahead and treat with intravenous antibiotics plus also too cover with  Zithromax by mouth, plus also oxygen, intravenous fluids and follow closely.  I have spoken with the hospitalist who will be following him over Saturday  and Sunday and we will follow him accordingly on  Monday.      Scott A. Gerda Diss, MD  Electronically Signed     SAL/MEDQ  D:  04/24/2005  T:  04/24/2005  Job:  [id number]

## 2010-08-29 NOTE — Discharge Summary (Signed)
[name], [name]                  ACCOUNT NO.:  [id number]   MEDICAL RECORD NO.:  [id number]          PATIENT TYPE:  INP   LOCATION:  A226                          FACILITY:  [facility]   PHYSICIAN:  Scott A. Gerda Diss, MD    DATE OF BIRTH:  10/17/1921   DATE OF ADMISSION:  06/07/2005  DATE OF DISCHARGE:  02/27/2007LH                                 DISCHARGE SUMMARY   DISCHARGE DIAGNOSES:  1.  Syncope, probable vasovagal.  2.  Myocardial infarction, ruled out.  3.  No detectable arrhythmia.  4.  Gastroenteritis secondary to virus versus medication reaction.   HOSPITAL COURSE:  The patient had been taking Levaquin and was making him  nauseated and then he started throwing up violently later that day and felt  very warm and flushed all over and then he states that everything just got  dark around him as he tried to head to the bathroom and passed out.  He  presented to the emergency department and was admitted into the hospital by  the hospitalists.  MI was ruled out.  He was placed on telemetry and did  well.  He was given some IV fluids.  He got over the gastroenteritis.  Cardiology was consulted and they felt that the patient was having mainly a  vaso-vagal episode.  They recommended an echocardiogram and carotid Doppler  study.  This was done.  His lab work overall showed a little bit of a low  hemoglobin at 8.8 on February 26.  This was to be followed up as an  outpatient.  His chemistry panel overall looked good and cardiac enzymes  were negative.  Stool cultures were negative.  His chest x-ray showed stable  COPD and improvement of the right lower lobe where he had recent pneumonia.  CT pulmonary embolus protocol was negative for pulmonary embolus.  CT of the  head with stable, small vessel ischemic changes, but no acute intracranial  findings.  Ultrasound of carotids showed 60-69% stenosis, but nothing  severe.  Echocardiogram showed no pericardial effusion, ejection fraction 50-  60% and a little bit of mitral valve calcification with mild regurgitation.  The patient was discharged in good condition and told to follow up in 1  week.      Scott A. Gerda Diss, MD  Electronically Signed     SAL/MEDQ  D:  06/30/2005  T:  06/30/2005  Job:  [id number]

## 2010-08-29 NOTE — Procedures (Signed)
NAMEDAJUAN, TURNLEY                  ACCOUNT NO.:  [id number]   MEDICAL RECORD NO.:  [id number]          PATIENT TYPE:  OUT   LOCATION:  RAD                           FACILITY:  [facility]   PHYSICIAN:  [place] Bing, M.D. [facility] OF BIRTH:  11-13-1921   DATE OF PROCEDURE:  DATE OF DISCHARGE:                                  ECHOCARDIOGRAM   CLINICAL DATA:  AN 77 year old gentleman with coronary artery disease.   M-MODE:  Aorta 3.8, left atrium 4.7, septum 1.3, posterior wall 1.3, LV  diastole 4.1, LV systole 2.8.   1.  Technically adequate echocardiographic study.  2.  Normal right atrium and right ventricle.  3.  Mild left atrial enlargement.  4.  Mild to moderate sclerosis of a probably trileaflet aortic valve; mild      annular calcification; trivial insufficiency.  5.  Normal mitral valve; moderate annular calcification.  6.  Tricuspid, pulmonic valve and proximal pulmonary artery are grossly      normal.  7.  Normal left ventricular size; borderline hypertrophy; normal regional      and global function.  8.  Normal IVC.      [place] Bing, M.D. [facility]  Electronically Signed     RR/MEDQ  D:  02/26/2005  T:  02/26/2005  Job:  [id number]

## 2010-08-29 NOTE — Procedures (Signed)
[name], [name]                  ACCOUNT NO.:  [id number]   MEDICAL RECORD NO.:  [id number]          PATIENT TYPE:  INP   LOCATION:  A226                          FACILITY:  [facility]   PHYSICIAN:  Vida Roller, M.D.   DATE OF BIRTH:  02-29-1922   DATE OF PROCEDURE:  06/08/2005  DATE OF DISCHARGE:                                  ECHOCARDIOGRAM   PRIMARY CARE PHYSICIAN:  Scott A. Gerda Diss, M.D.   TAPE NUMBER:  [id number].  Tape count [id number].   INDICATIONS:  This is an 77 year old man with syncope.  Technical quality of  the study is adequate.   M-MODE TRACINGS:  The aorta is 38 mm.   The left atrium is 50 mm.   The septum is 14 mm.   The posterior wall is 13 mm.   Left ventricular diastolic dimension is 15 mm.   Left ventricular systolic dimension is 35 mm.   Two-dimensional echocardiogram and Doppler imaging:  The left ventricle is  normal size.  There is preserved LV systolic function with estimated  ejection fraction of 55-60%.  There is mild left ventricular hypertrophy  which is concentric.  There are no wall motion abnormalities.   The right ventricle is normal size with normal systolic function.   Both atria are dilated.   The mitral valve has significant annular calcification with a fixed  posterior leaflet.  There is no obvious mitral stenosis.  There is mild  regurgitation.   The aortic valve is sclerotic and opens normally.  There is trivial  insufficiency.  No stenosis is seen.   The tricuspid valve has mild regurgitation.   There is no pericardial effusion.   The inferior vena cava appears to be normal size.      Vida Roller, M.D.  Electronically Signed     JH/MEDQ  D:  06/08/2005  T:  06/09/2005  Job:  [id number]

## 2010-08-29 NOTE — Op Note (Signed)
NAMERAWLEY, HARJU                  ACCOUNT NO.:  [id number]   MEDICAL RECORD NO.:  [id number]           PATIENT TYPE:  INP   LOCATION:  A226                          FACILITY:  [facility]   PHYSICIAN:  Ky Barban, M.D.DATE OF BIRTH:  [birth year]   DATE OF PROCEDURE:  06/08/2005  DATE OF DISCHARGE:                                 OPERATIVE REPORT   PREOPERATIVE DIAGNOSIS:  Benign prostatic hypertrophy.   POSTOPERATIVE DIAGNOSIS:  Benign prostatic hypertrophy.   PROCEDURE:  Cystoscopy.   SURGEON:  Ky Barban, M.D.   ANESTHESIA:  Xylocaine jelly.   DESCRIPTION OF PROCEDURE:  The patient placed in the supine position after  routine prep and drape.  Xylocaine jelly instilled into the urethra.  After  waiting an adequate time, a flexible cystoscope was introduced under direct  vision.  The anterior urethra looks normal.  The prostatic urethra is  obstructed with lateral lobe hypertrophy.  The bladder is triply  trabeculated with no tumor, stone, foreign body, or inflammation.  The  bladder was filled up.  The patient was given a voiding trial.  He was  unable to void.  The patient left the operating room in satisfactory  condition.      Ky Barban, M.D.  Electronically Signed     MIJ/MEDQ  D:  06/08/2005  T:  06/08/2005  Job:  [id number]   cc:   Osvaldo Shipper, MD

## 2010-12-06 ENCOUNTER — Emergency Department (HOSPITAL_COMMUNITY): Payer: Medicare Other

## 2010-12-06 ENCOUNTER — Encounter (HOSPITAL_COMMUNITY): Payer: Self-pay | Admitting: Emergency Medicine

## 2010-12-06 ENCOUNTER — Other Ambulatory Visit: Payer: Self-pay

## 2010-12-06 ENCOUNTER — Inpatient Hospital Stay (HOSPITAL_COMMUNITY)
Admission: EM | Admit: 2010-12-06 | Discharge: 2010-12-08 | DRG: 192 | Disposition: A | Discharge status: Home / Self Care | Payer: Medicare Other | Attending: Internal Medicine | Admitting: Internal Medicine

## 2010-12-06 DIAGNOSIS — I2510 Atherosclerotic heart disease of native coronary artery without angina pectoris: Secondary | ICD-10-CM | POA: Diagnosis present

## 2010-12-06 DIAGNOSIS — D126 Benign neoplasm of colon, unspecified: Secondary | ICD-10-CM

## 2010-12-06 DIAGNOSIS — N183 Chronic kidney disease, stage 3 unspecified: Secondary | ICD-10-CM | POA: Diagnosis present

## 2010-12-06 DIAGNOSIS — E785 Hyperlipidemia, unspecified: Secondary | ICD-10-CM | POA: Diagnosis present

## 2010-12-06 DIAGNOSIS — F17201 Nicotine dependence, unspecified, in remission: Secondary | ICD-10-CM

## 2010-12-06 DIAGNOSIS — D649 Anemia, unspecified: Secondary | ICD-10-CM

## 2010-12-06 DIAGNOSIS — J209 Acute bronchitis, unspecified: Principal | ICD-10-CM | POA: Diagnosis present

## 2010-12-06 DIAGNOSIS — R079 Chest pain, unspecified: Secondary | ICD-10-CM | POA: Diagnosis present

## 2010-12-06 DIAGNOSIS — R55 Syncope and collapse: Secondary | ICD-10-CM

## 2010-12-06 DIAGNOSIS — J449 Chronic obstructive pulmonary disease, unspecified: Secondary | ICD-10-CM

## 2010-12-06 DIAGNOSIS — R0789 Other chest pain: Secondary | ICD-10-CM | POA: Diagnosis present

## 2010-12-06 DIAGNOSIS — J440 Chronic obstructive pulmonary disease with acute lower respiratory infection: Principal | ICD-10-CM | POA: Diagnosis present

## 2010-12-06 DIAGNOSIS — R0600 Dyspnea, unspecified: Secondary | ICD-10-CM

## 2010-12-06 DIAGNOSIS — I679 Cerebrovascular disease, unspecified: Secondary | ICD-10-CM

## 2010-12-06 DIAGNOSIS — G252 Other specified forms of tremor: Secondary | ICD-10-CM

## 2010-12-06 DIAGNOSIS — G250 Essential tremor: Secondary | ICD-10-CM | POA: Diagnosis present

## 2010-12-06 DIAGNOSIS — I129 Hypertensive chronic kidney disease with stage 1 through stage 4 chronic kidney disease, or unspecified chronic kidney disease: Secondary | ICD-10-CM | POA: Diagnosis present

## 2010-12-06 DIAGNOSIS — J309 Allergic rhinitis, unspecified: Secondary | ICD-10-CM | POA: Diagnosis present

## 2010-12-06 DIAGNOSIS — I10 Essential (primary) hypertension: Secondary | ICD-10-CM

## 2010-12-06 DIAGNOSIS — Z87891 Personal history of nicotine dependence: Secondary | ICD-10-CM

## 2010-12-06 DIAGNOSIS — J9801 Acute bronchospasm: Secondary | ICD-10-CM | POA: Diagnosis present

## 2010-12-06 HISTORY — DX: Malignant (primary) neoplasm, unspecified: C80.1

## 2010-12-06 HISTORY — DX: Other disorders of lung: J98.4

## 2010-12-06 LAB — CBC
HCT: 36.1 % — ABNORMAL LOW (ref 39.0–52.0)
Hemoglobin: 12.1 g/dL — ABNORMAL LOW (ref 13.0–17.0)
MCH: 32.4 pg (ref 26.0–34.0)
MCH: 31.9 pg (ref 26.0–34.0)
MCHC: 33.5 g/dL (ref 30.0–36.0)
MCV: 96.8 fL (ref 78.0–100.0)
Platelets: 223 K/uL (ref 150–400)
Platelets: 203 10*3/uL (ref 150–400)
RBC: 3.73 MIL/uL — ABNORMAL LOW (ref 4.22–5.81)
RBC: 3.54 MIL/uL — ABNORMAL LOW (ref 4.22–5.81)
RDW: 14.5 % (ref 11.5–15.5)
RDW: 14.4 % (ref 11.5–15.5)
WBC: 10 10*3/uL (ref 4.0–10.5)
WBC: 7.4 10*3/uL (ref 4.0–10.5)

## 2010-12-06 LAB — BASIC METABOLIC PANEL WITH GFR
BUN: 25 mg/dL — ABNORMAL HIGH (ref 6–23)
CO2: 29 meq/L (ref 19–32)
Calcium: 9.7 mg/dL (ref 8.4–10.5)
Creatinine, Ser: 1.74 mg/dL — ABNORMAL HIGH (ref 0.50–1.35)
GFR calc non Af Amer: 37 mL/min — ABNORMAL LOW (ref >60)
Glucose, Bld: 139 mg/dL — ABNORMAL HIGH (ref 70–99)

## 2010-12-06 LAB — CARDIAC PANEL(CRET KIN+CKTOT+MB+TROPI)
CK, MB: 3.9 ng/mL (ref 0.3–4.0)
Relative Index: 2.6 — ABNORMAL HIGH (ref 0.0–2.5)
Total CK: 148 U/L (ref 7–232)
Troponin I: <0.3 ng/mL (ref <0.30)

## 2010-12-06 LAB — BASIC METABOLIC PANEL
Chloride: 101 mEq/L (ref 96–112)
GFR calc Af Amer: 45 mL/min — ABNORMAL LOW (ref >60)
Potassium: 4.2 mEq/L (ref 3.5–5.1)
Sodium: 140 mEq/L (ref 135–145)

## 2010-12-06 LAB — DIFFERENTIAL
Basophils Absolute: 0 10*3/uL (ref 0.0–0.1)
Basophils Relative: 0 % (ref 0–1)
Eosinophils Absolute: 0.2 K/uL (ref 0.0–0.7)
Eosinophils Relative: 2 % (ref 0–5)
Lymphocytes Relative: 28 % (ref 12–46)
Lymphs Abs: 2.8 K/uL (ref 0.7–4.0)
Monocytes Absolute: 0.8 K/uL (ref 0.1–1.0)
Monocytes Relative: 8 % (ref 3–12)
Neutro Abs: 6.2 10*3/uL (ref 1.7–7.7)
Neutrophils Relative %: 62 % (ref 43–77)

## 2010-12-06 LAB — HEPATIC FUNCTION PANEL
Alkaline Phosphatase: 110 U/L (ref 39–117)
Bilirubin, Direct: 0.1 mg/dL (ref 0.0–0.3)
Indirect Bilirubin: 0.3 mg/dL (ref 0.3–0.9)
Total Bilirubin: 0.4 mg/dL (ref 0.3–1.2)

## 2010-12-06 LAB — CREATININE, SERUM: Creatinine, Ser: 1.58 mg/dL — ABNORMAL HIGH (ref 0.50–1.35)

## 2010-12-06 LAB — PRO B NATRIURETIC PEPTIDE: Pro B Natriuretic peptide (BNP): 1157 pg/mL — ABNORMAL HIGH (ref 0–450)

## 2010-12-06 LAB — D-DIMER, QUANTITATIVE: D-Dimer, Quant: 0.79 ug/mL-FEU — ABNORMAL HIGH (ref 0.00–0.48)

## 2010-12-06 MED ORDER — TERAZOSIN HCL 5 MG PO CAPS
5.0000 mg | ORAL_CAPSULE | Freq: Every day | ORAL | Status: DC
  Administered 2010-12-06 – 2010-12-07 (×2): 5 mg via ORAL
  Filled 2010-12-06 (×2): qty 1

## 2010-12-06 MED ORDER — LORATADINE 10 MG PO TABS
10.0000 mg | ORAL_TABLET | Freq: Every day | ORAL | Status: DC
  Administered 2010-12-06 – 2010-12-07 (×2): 10 mg via ORAL
  Filled 2010-12-06 (×3): qty 1

## 2010-12-06 MED ORDER — FLUTICASONE PROPIONATE 50 MCG/ACT NA SUSP
2.0000 | Freq: Every day | NASAL | Status: DC
  Administered 2010-12-07 – 2010-12-08 (×2): 2 via NASAL
  Filled 2010-12-06: qty 2

## 2010-12-06 MED ORDER — DM-GUAIFENESIN ER 30-600 MG PO TB12
1.0000 | ORAL_TABLET | Freq: Two times a day (BID) | ORAL | Status: DC | PRN
  Filled 2010-12-06: qty 1

## 2010-12-06 MED ORDER — FUROSEMIDE 40 MG PO TABS
40.0000 mg | ORAL_TABLET | Freq: Every day | ORAL | Status: DC
  Administered 2010-12-06 – 2010-12-08 (×3): 40 mg via ORAL
  Filled 2010-12-06 (×3): qty 1

## 2010-12-06 MED ORDER — IPRATROPIUM BROMIDE 0.02 % IN SOLN
0.5000 mg | Freq: Once | RESPIRATORY_TRACT | Status: AC
  Administered 2010-12-06: 0.5 mg via RESPIRATORY_TRACT
  Filled 2010-12-06: qty 2.5

## 2010-12-06 MED ORDER — ACETAMINOPHEN 325 MG PO TABS: 650.0000 mg | ORAL_TABLET | Freq: Four times a day (QID) | ORAL | Status: DC | PRN

## 2010-12-06 MED ORDER — ONDANSETRON HCL 4 MG PO TABS: 4.0000 mg | ORAL_TABLET | Freq: Four times a day (QID) | ORAL | Status: DC | PRN

## 2010-12-06 MED ORDER — AZITHROMYCIN 250 MG PO TABS
500.0000 mg | ORAL_TABLET | Freq: Every day | ORAL | Status: AC
  Administered 2010-12-06: 500 mg via ORAL
  Filled 2010-12-06: qty 2

## 2010-12-06 MED ORDER — MORPHINE SULFATE 2 MG/ML IJ SOLN: 1.0000 mg | INTRAMUSCULAR | Status: DC | PRN

## 2010-12-06 MED ORDER — SENNA 8.6 MG PO TABS: 2.0000 | ORAL_TABLET | Freq: Every day | ORAL | Status: DC | PRN

## 2010-12-06 MED ORDER — ALBUTEROL SULFATE (5 MG/ML) 0.5% IN NEBU
2.5000 mg | INHALATION_SOLUTION | Freq: Once | RESPIRATORY_TRACT | Status: AC
  Administered 2010-12-06: 2.5 mg via RESPIRATORY_TRACT
  Filled 2010-12-06: qty 0.5

## 2010-12-06 MED ORDER — NITROGLYCERIN 0.4 MG SL SUBL: 0.4000 mg | SUBLINGUAL_TABLET | SUBLINGUAL | Status: DC | PRN

## 2010-12-06 MED ORDER — ALBUTEROL SULFATE (5 MG/ML) 0.5% IN NEBU: 2.5000 mg | INHALATION_SOLUTION | RESPIRATORY_TRACT | Status: DC | PRN

## 2010-12-06 MED ORDER — THERA M PLUS PO TABS
1.0000 | ORAL_TABLET | Freq: Every day | ORAL | Status: DC
  Administered 2010-12-07: 1 via ORAL
  Filled 2010-12-06 (×2): qty 1

## 2010-12-06 MED ORDER — HYDROCODONE-ACETAMINOPHEN 5-325 MG PO TABS: 1.0000 | ORAL_TABLET | ORAL | Status: DC | PRN

## 2010-12-06 MED ORDER — ACETAMINOPHEN 650 MG RE SUPP: 650.0000 mg | Freq: Four times a day (QID) | RECTAL | Status: DC | PRN

## 2010-12-06 MED ORDER — AZITHROMYCIN 250 MG PO TABS
250.0000 mg | ORAL_TABLET | Freq: Every day | ORAL | Status: DC
  Administered 2010-12-07 – 2010-12-08 (×2): 250 mg via ORAL
  Filled 2010-12-06 (×2): qty 1

## 2010-12-06 MED ORDER — SODIUM CHLORIDE 0.9 % IV SOLN
Freq: Once | INTRAVENOUS | Status: AC
  Administered 2010-12-06: 14:00:00 via INTRAVENOUS

## 2010-12-06 MED ORDER — ASPIRIN 81 MG PO CHEW
81.0000 mg | CHEWABLE_TABLET | Freq: Every day | ORAL | Status: DC
  Administered 2010-12-07 – 2010-12-08 (×2): 81 mg via ORAL
  Filled 2010-12-06 (×2): qty 1

## 2010-12-06 MED ORDER — TRAZODONE HCL 50 MG PO TABS: 50.0000 mg | ORAL_TABLET | Freq: Every evening | ORAL | Status: DC | PRN

## 2010-12-06 MED ORDER — METOPROLOL TARTRATE 50 MG PO TABS
50.0000 mg | ORAL_TABLET | Freq: Every day | ORAL | Status: DC
  Administered 2010-12-07 – 2010-12-08 (×2): 50 mg via ORAL
  Filled 2010-12-06 (×3): qty 1

## 2010-12-06 MED ORDER — DILTIAZEM HCL ER COATED BEADS 180 MG PO CP24
180.0000 mg | ORAL_CAPSULE | Freq: Every day | ORAL | Status: DC
  Administered 2010-12-07 – 2010-12-08 (×2): 180 mg via ORAL
  Filled 2010-12-06 (×2): qty 1

## 2010-12-06 MED ORDER — ONDANSETRON HCL 4 MG/2ML IJ SOLN: 4.0000 mg | Freq: Four times a day (QID) | INTRAMUSCULAR | Status: DC | PRN

## 2010-12-06 MED ORDER — ENOXAPARIN SODIUM 30 MG/0.3ML ~~LOC~~ SOLN
30.0000 mg | SUBCUTANEOUS | Status: DC
  Administered 2010-12-06 – 2010-12-07 (×2): 30 mg via SUBCUTANEOUS
  Filled 2010-12-06 (×2): qty 0.3

## 2010-12-06 MED ORDER — BUDESONIDE-FORMOTEROL FUMARATE 80-4.5 MCG/ACT IN AERO
2.0000 | INHALATION_SPRAY | Freq: Two times a day (BID) | RESPIRATORY_TRACT | Status: DC
  Administered 2010-12-07 – 2010-12-08 (×2): 2 via RESPIRATORY_TRACT
  Filled 2010-12-06: qty 6.9

## 2010-12-06 MED ORDER — PREDNISONE 20 MG PO TABS
40.0000 mg | ORAL_TABLET | Freq: Once | ORAL | Status: AC
  Administered 2010-12-06: 40 mg via ORAL
  Filled 2010-12-06: qty 2

## 2010-12-06 MED ORDER — ALBUTEROL SULFATE (5 MG/ML) 0.5% IN NEBU
2.5000 mg | INHALATION_SOLUTION | Freq: Four times a day (QID) | RESPIRATORY_TRACT | Status: DC
  Administered 2010-12-06: 2.5 mg via RESPIRATORY_TRACT
  Filled 2010-12-06: qty 0.5

## 2010-12-06 MED ORDER — ALUM & MAG HYDROXIDE-SIMETH 200-200-20 MG/5ML PO SUSP: 30.0000 mL | Freq: Four times a day (QID) | ORAL | Status: DC | PRN

## 2010-12-06 NOTE — ED Notes (Signed)
Hospitalist here to evaluate pt ,

## 2010-12-06 NOTE — ED Notes (Signed)
Pt denies any pain at present, did state that he had previously had a pain that was "while ago" then pain went away, pt states that he has had problems with chest pain that comes and goes,

## 2010-12-06 NOTE — ED Notes (Signed)
Report given to Lindsay, RN

## 2010-12-06 NOTE — ED Notes (Signed)
Chest pain that started yesterday, sob that has gotten worse over the past few days, pt has bilateral swelling to lower extremities, pt states that he has this sometimes,  ekg done on arrival and shown to edp,

## 2010-12-06 NOTE — ED Notes (Signed)
MD at bedside. 

## 2010-12-06 NOTE — ED Notes (Signed)
Pt c/o chest pain and SOB. [place] on the golf [place] around 11 am yesterday. C/O weakness, no nausea, no diaphoresis, no radiation. Pt with hx CABG.

## 2010-12-06 NOTE — ED Provider Notes (Signed)
History     CSN: [id number] Arrival date & time: 12/06/2010 12:30 PM  Chief Complaint  Patient presents with  . Chest Pain   HPI Comments: The patient has significant is to coronary disease status post four-vessel CABG in 1995. Based on old records as well as patient and family, the his last stress Myoview was in 2003. The patient does not have anginal pains very frequently and has not use nitroglycerin en route he can recall. The patient was playing 9 holes of golf yesterday and while playing had a 2 minute episode of chest pain in his anterior chest it was nonradiating. The patient did get more short of breath. The patient also has history of COPD reports he has felt a little more short of breath than usual recently. He is taking his inhalers as prescribed. He denies any significant fevers or coughs. He does occasionally get some chest tightness with his COPD exacerbations however the chest pain management yesterday did not remind him of his COPD exacerbation. Today while walking to the grocery store he also experience another episode of 2 minutes of chest tightness. He again got short of breath as well but did not have any diaphoresis or nausea. At present the patient reports that he has no chest pain but does feel mildly short of breath. Otherwise he denies any significant cold symptoms, diarrhea, feelings of lightheadedness or feeling faint. The patient reports he has not spoken to his cardiologist who is Dr. Dietrich Pates. He indicates that he has taken his usual aspirin today.  Patient is a 77 y.o. male presenting with chest pain. The history is provided by the patient and a relative.  Chest Pain     Past Medical History  Diagnosis Date  . ASCVD (arteriosclerotic cardiovascular disease)      CABG in 04/1993; and negative stress nuclear study in 08/2001  . Hyperlipidemia   . Syncope   . Hypertension   . Tobacco abuse, in remission     40 pack years; discontinued in 1980  . Cerebrovascular  disease     Right carotid bruit-40-69% left internal carotid artery stenosis in 4/06; followed [facility]  . Chronic kidney disease, stage II (mild)     Creatinine-1.6 in 09/2008  . Degenerative joint disease of knee, left   . Normocytic anemia   . Pulmonary disease   . Cancer     Past Surgical History  Procedure Date  . Coronary artery bypass graft 1995  . Colonoscopy w/ polypectomy 1985  . Pilonidal cyst excision 1948  . Lesion excision     Family History  Problem Relation Age of Onset  . Cancer Brother   . Heart attack Brother     History  Substance Use Topics  . Smoking status: Former Smoker -- 1.0 packs/day for 40 years    Types: Cigarettes    Quit date: 08/14/1973  . Smokeless tobacco: Former Neurosurgeon    Types: Chew    Quit date: 12/05/1980  . Alcohol Use: No      Review of Systems  Cardiovascular: Positive for chest pain.  All other systems reviewed and are negative.    Physical Exam  BP 138/51  Pulse 68  Temp(Src) 98.5 F (36.9 C) (Oral)  Resp 21  Ht 5\' 10"  (1.778 m)  Wt 186 lb (84.369 kg)  BMI 26.69 kg/m2  SpO2 98%  Physical Exam  Constitutional: He is oriented to person, place, and time. He appears well-developed and well-nourished. No distress.  HENT:  Head:  Normocephalic and atraumatic.  Eyes: Conjunctivae are normal. Pupils are equal, round, and reactive to light. No scleral icterus.  Neck: Neck supple.  Cardiovascular: Normal rate and regular rhythm.   Pulmonary/Chest: Accessory muscle usage present. Tachypnea noted. No respiratory distress. He has no decreased breath sounds. He has wheezes in the right upper field, the right lower field, the left upper field and the left lower field. He has no rhonchi. He has no rales.  Abdominal: Soft. He exhibits no distension. There is no tenderness. There is no rebound and no guarding.  Musculoskeletal: Normal range of motion.  Neurological: He is alert and oriented to person, place, and time.  Skin: Skin is  warm and dry. He is not diaphoretic.  Psychiatric: He has a normal mood and affect. His speech is normal.    ED Course  Procedures  MDM ECG dated 12:33, rate 74, sinus rhythm with PVC's, normal axis, normal intervals, no ST or T wave changes.  No sig change from 08/17/10.  Patient reports he does not often gets anginal pains. Yesterday and today he had 2 minutes of chest pain that was related to mild exertion. There are no acute EKG changes. A typical labs including troponin and chest x-ray are obtained. Patient does have wheezing on exam and will provide breathing treatments. However he reports that he normally does not get chest pains with his COPD although his family disagrees. However with direct questioning he reports that the chest pain that he sometimes does get with COPD is dissimilar to the pain that he spent yesterday and today. I'm concerned that the symptoms may be Jake Shark name episodes of unstable angina. Will monitor patient closely for any worsening signs of chest pain. He started taking his aspirin. Once chest x-ray and lab tests are obtained I will consult with the lumbar cardiologist at [facility] for consideration for transfer and catheterization.   Critical care time of 30 minutes spent at bedside, review of records, discussion with family, documentation, respiratory treatments.      2:58 PM Pt reports feeling much improved for SOB.  No further CP's.  Troponin is normal.  Will discuss with cardiology  3:11 PM After discussion with Dr. Johney Frame, he feels since the patient has a mixed picture with COPD he has no chest pain currently and has episodes of chest pain were so brief he does not feel that this is acute coronary syndrome at this time. His EKG here is relatively normal. We'll admit him to medicine here at any pen where his enzymes can be ruled out and his COPD symptoms could also be managed as well. 3:29 PM Spoke to Dr. Lendell Caprice who will see for admission in the  ED.  Gavin Pound. Glenyce Randle, MD 12/06/10 1530

## 2010-12-07 DIAGNOSIS — J209 Acute bronchitis, unspecified: Secondary | ICD-10-CM | POA: Diagnosis present

## 2010-12-07 DIAGNOSIS — R0600 Dyspnea, unspecified: Secondary | ICD-10-CM | POA: Diagnosis present

## 2010-12-07 DIAGNOSIS — J9801 Acute bronchospasm: Secondary | ICD-10-CM | POA: Diagnosis present

## 2010-12-07 LAB — CARDIAC PANEL(CRET KIN+CKTOT+MB+TROPI)
CK, MB: 3.3 ng/mL (ref 0.3–4.0)
Relative Index: INVALID (ref 0.0–2.5)
Total CK: 94 U/L (ref 7–232)
Total CK: 113 U/L (ref 7–232)

## 2010-12-07 MED ORDER — ALBUTEROL SULFATE (5 MG/ML) 0.5% IN NEBU
2.5000 mg | INHALATION_SOLUTION | Freq: Four times a day (QID) | RESPIRATORY_TRACT | Status: DC
  Administered 2010-12-07 – 2010-12-08 (×5): 2.5 mg via RESPIRATORY_TRACT
  Filled 2010-12-07 (×6): qty 0.5

## 2010-12-07 MED ORDER — DOCUSATE SODIUM 100 MG PO CAPS
100.0000 mg | ORAL_CAPSULE | Freq: Two times a day (BID) | ORAL | Status: DC
  Administered 2010-12-07 – 2010-12-08 (×3): 100 mg via ORAL
  Filled 2010-12-07 (×3): qty 1

## 2010-12-07 MED ORDER — ALBUTEROL SULFATE (5 MG/ML) 0.5% IN NEBU
2.5000 mg | INHALATION_SOLUTION | Freq: Three times a day (TID) | RESPIRATORY_TRACT | Status: DC
  Administered 2010-12-07: 2.5 mg via RESPIRATORY_TRACT
  Filled 2010-12-07: qty 0.5

## 2010-12-07 MED ORDER — IPRATROPIUM BROMIDE 0.02 % IN SOLN
0.5000 mg | Freq: Four times a day (QID) | RESPIRATORY_TRACT | Status: DC
  Administered 2010-12-07 – 2010-12-08 (×4): 0.5 mg via RESPIRATORY_TRACT
  Filled 2010-12-07 (×5): qty 2.5

## 2010-12-07 MED ORDER — PREDNISONE 20 MG PO TABS
40.0000 mg | ORAL_TABLET | Freq: Two times a day (BID) | ORAL | Status: DC
  Administered 2010-12-07 – 2010-12-08 (×3): 40 mg via ORAL
  Filled 2010-12-07 (×3): qty 2

## 2010-12-07 NOTE — H&P (Signed)
Hospital Admission Note Date: 12/07/2010  Patient name: [name] Medical record number: [id number] Date of birth: 27-Nov-1921 Age: 77 y.o. Gender: male PCP: Lilyan Punt, MD, MD  Attending physician: Christiane Ha  Chief Complaint: Chest pain  History of Present Illness: The patient is an 77 year old white male with history of heart disease who presents with chest pain. He has had several episodes of chest pain over the past several weeks. They usually last less than a few minutes. They're usually associated with exertion. They're left-sided and have no radiation. He also has had a cough productive of clear sputum for the past several weeks. He has become very dyspneic particularly on exertion. His inhalers have been changed by his primary care physician due to the dyspnea. He has not noted an improvement. He is usually quite active and exercises several times a week. His last stress test was in 2003. He does have a history of heart disease and coronary artery bypass graft in 1995. He has had no fevers or chills. He has never had a pulmonary embolus. He has not been immobilized or taken any long trips recently. He has had no change in his chronic mild leg edema. He has no leg pain. His pain is resolved. His EKG shows nothing concerning. The ED physician has spoken with Dr. Johney Frame who feels patient can be monitored at [facility] and does not require transferred to [facility].  Meds: Prescriptions prior to admission  Medication Sig Dispense Refill  . aspirin 81 MG tablet Take 81 mg by mouth daily.        . Budesonide-Formoterol Fumarate (SYMBICORT IN) Inhale 2 puffs into the lungs 2 (two) times daily.        Marland Kitchen diltiazem (CARDIZEM CD) 180 MG 24 hr capsule Take 180 mg by mouth daily.        . fluticasone (FLONASE) 50 MCG/ACT nasal spray Place 2 sprays into the nose daily.        . furosemide (LASIX) 20 MG tablet Take 20-40 mg by mouth 3 (three) times daily. Patient takes 40mg   in the morning and 20mg  at noon       . furosemide (LASIX) 40 MG tablet Take 40 mg by mouth daily.        . metoprolol (LOPRESSOR) 50 MG tablet Take 50 mg by mouth daily.       . Multiple Vitamins-Minerals (MULTIVITAMIN WITH MINERALS) tablet Take 1 tablet by mouth daily.        Marland Kitchen PRESCRIPTION MEDICATION Place 2 sprays into the nose daily. Patient states that he uses some type of prescription nasal spray.       . terazosin (HYTRIN) 5 MG capsule Take 5 mg by mouth at bedtime.        Marland Kitchen tetrahydrozoline-zinc (VISINE-AC) 0.05-0.25 % ophthalmic solution Place 1 drop into both eyes daily.        . VENTOLIN HFA 108 (90 BASE) MCG/ACT inhaler Inhale 2 puffs into the lungs 4 (four) times daily.       . nitroGLYCERIN (NITROSTAT) 0.4 MG SL tablet Place 1 tablet (0.4 mg total) under the tongue every 5 (five) minutes as needed for chest pain.  25 tablet  2   Allergies: Beta adrenergic blockers; Penicillins; and Sulfonamide derivatives Past Medical History  Diagnosis Date  . ASCVD (arteriosclerotic cardiovascular disease)      CABG in 04/1993; and negative stress nuclear study in 08/2001  . Hyperlipidemia   . Syncope   . Hypertension   .  Tobacco abuse, in remission     40 pack years; discontinued in 1980  . Cerebrovascular disease     Right carotid bruit-40-69% left internal carotid artery stenosis in 4/06; followed [facility]  . Chronic kidney disease, stage II (mild)     Creatinine-1.6 in 09/2008  . Degenerative joint disease of knee, left   . Normocytic anemia   . Pulmonary disease   . Cancer    Past Surgical History  Procedure Date  . Coronary artery bypass graft 1995  . Colonoscopy w/ polypectomy 1985  . Pilonidal cyst excision 1948  . Lesion excision    Family History  Problem Relation Age of Onset  . Cancer Brother   . Heart attack Brother    History   Social History  . Marital Status: Married    Spouse Name: N/A    Number of Children: 3  . Years of Education: N/A   Occupational  History  . retired     Personnel officer with [employer]   Social History Main Topics  . Smoking status: Former Smoker -- 1.0 packs/day for 40 years    Types: Cigarettes    Quit date: 08/14/1973  . Smokeless tobacco: Former Neurosurgeon    Types: Chew    Quit date: 12/05/1980  . Alcohol Use: No  . Drug Use: No  . Sexually Active: Not on file   Other Topics Concern  . Not on file   Social History Narrative  . No narrative on file   Review of Systems: A comprehensive review of systems was negative except for chronic rhinorrhea. He takes an unknown nasal spray which does not seem to help. He does not take any antihistamines. He has had no orthopnea paroxysmal nocturnal dyspnea or palpitations Physical Exam: Blood pressure 151/67, pulse 87, temperature 97.6 F (36.4 C), temperature source Oral, resp. rate 19, height 5\' 10"  (1.778 m), weight 83.9 kg (184 lb 15.5 oz), SpO2 92.00%. BP 151/67  Pulse 87  Temp(Src) 97.6 F (36.4 C) (Oral)  Resp 19  Ht 5\' 10"  (1.778 m)  Wt 83.9 kg (184 lb 15.5 oz)  BMI 26.54 kg/m2  SpO2 92%  General Appearance:    Alert, cooperative, no distress, appears stated age  Head:    Normocephalic, without obvious abnormality, atraumatic  Eyes:    PERRL, conjunctiva/corneas clear, EOM's intact, fundi    benign, both eyes. He has a basin containing tissues with clear sputum         Nose:   Nares normal, septum midline, mucosa normal, no drainage    or sinus tenderness  Throat:   Lips, mucosa, and tongue normal; teeth and gums normal  Neck:   Supple, symmetrical, trachea midline, no adenopathy;       thyroid:  No enlargement/tenderness/nodules; no carotid   bruit or JVD  Back:     Symmetric, no curvature, ROM normal, no CVA tenderness  Lungs:     Clear to auscultation bilaterally, respirations unlabored per ED physician, patient was wheezing initially until he got a bronchodilator.   Chest wall:    No tenderness or deformity  Heart:    Regular rate and rhythm, S1 and S2  normal, no murmur, rub   or gallop  Abdomen:     Soft, non-tender, bowel sounds active all four quadrants,    no masses, no organomegaly  Genitalia:   deferred   Rectal:   deferred  Extremities:   Extremities normal, atraumatic, no cyanosis or edema  Pulses:   2+  and symmetric all extremities  Skin:   Skin color, texture, turgor normal, no rashes or lesions  Lymph nodes:   Cervical, supraclavicular, and axillary nodes normal  Neurologic:   CNII-XII intact. Normal strength, sensation and reflexes      throughout   Lab results: Basic Metabolic Panel:  Basename 12/06/10 1955 12/06/10 1301  NA -- 140  K -- 4.2  CL -- 101  CO2 -- 29  GLUCOSE -- 139*  BUN -- 25*  CREATININE 1.58* 1.74*  CALCIUM -- 9.7  MG -- --  PHOS -- --   Liver Function Tests:  [facility] 12/06/10 1955  AST 22  ALT 17  ALKPHOS 110  BILITOT 0.4  PROT 6.9  ALBUMIN 3.6   No results found for this basename: LIPASE:2,AMYLASE:2 in the last 72 hours CBC:  Basename 12/06/10 1955 12/06/10 1301  WBC 7.4 10.0  NEUTROABS -- 6.2  HGB 11.3* 12.1*  HCT 34.2* 36.1*  MCV 96.6 96.8  PLT 203 223   Cardiac Enzymes:  Basename 12/07/10 0540 12/06/10 1301  CKTOTAL 94 148  CKMB 3.3 3.9  CKMBINDEX -- --  TROPONINI <0.30 <0.30   BNP:  Basename 12/06/10 1956 12/06/10 1302  POCBNP 1336.0* 1157.0*   D-Dimer:  Alvira Philips 12/06/10 1955  DDIMER 0.79*   CBG: No results found for this basename: GLUCAP:6 in the last 72 hours Hemoglobin A1C: No results found for this basename: HGBA1C in the last 72 hours Fasting Lipid Panel: No results found for this basename: CHOL,HDL,LDLCALC,TRIG,CHOLHDL,LDLDIRECT in the last 72 hours Thyroid Function Tests: No results found for this basename: TSH,T4TOTAL,FREET4,T3FREE,THYROIDAB in the last 72 hours Anemia Panel: No results found for this basename: VITAMINB12,FOLATE,FERRITIN,TIBC,IRON,RETICCTPCT in the last 72 hours  Imaging results:  Dg Chest Portable 1 View  12/06/2010   *RADIOLOGY REPORT*  Clinical Data: Chest pain.  Shortness of breath.  PORTABLE CHEST - 1 VIEW  Comparison: 06/21/2009  Findings: 1315 hours. Hyperexpansion is consistent with emphysema. Chronic atelectasis or scarring seen at the right lung base. The cardiopericardial silhouette is enlarged. Telemetry leads overlie the chest.  IMPRESSION: Cardiomegaly with emphysema.  No edema or focal pneumonia.  Original Report Authenticated By: ERIC A. MANSELL, M.D.   Other results: EKG: normal sinus rhythm with PVCs no significant ST changes   Assessment & Plan by Problem: Principal Problem:  *Chest pain Active Problems:  HYPERTENSION  Acute bronchitis  Bronchospasm  Dyspnea  HYPERLIPIDEMIA  TREMOR, ESSENTIAL  ATHEROSCLEROTIC CARDIOVASCULAR DISEASE  CKD (chronic kidney disease), stage III  The patient will be observed. Rule out MI. Check d-dimer and consider CT angiogram of the chest if positive. Continue aspirin. He will be started on azithromycin for acute bronchitis. Bronchodilators for his wheezing. Claritin for his rhinorrhea. If he rules out, consider a stress test possibly as an outpatient. Also will do an echocardiogram given his significant dyspnea though this may be related to his bronchospasm. Continue his outpatient medications.   Kasey Ewings L 12/07/2010, 7:40 AM

## 2010-12-07 NOTE — Progress Notes (Signed)
Subjective: No further chest pain. He is complaining of a lot of chest congestion and sputum production. His chest feels "sore". Rhinorrhea is improved on Claritin.  Objective: Vital signs in last 24 hours: Filed Vitals:   12/06/10 1928 12/06/10 2024 12/07/10 0517 12/07/10 0754  BP: 137/52  151/67   Pulse: 88  87   Temp: 97.5 F (36.4 C)  97.6 F (36.4 C)   TempSrc: Oral  Oral   Resp: 18  19   Height:      Weight:      SpO2: 94% 94% 92% 96%   Weight change:   Intake/Output Summary (Last 24 hours) at 12/07/10 1223 Last data filed at 12/07/10 0900  Gross per 24 hour  Intake    200 ml  Output      0 ml  Net    200 ml   Physical examination General appears more dyspneic today. Audible wheezing across the room. Lung sounds: Diminished throughout with prolonged expiratory phase with wheeze and less air movement than yesterday. Cardiovascular regular rate rhythm without murmurs gallops rubs Abdomen soft nontender nondistended Extremities edema unchanged.  Lab Results: Basic Metabolic Panel:  Lab 12/06/10 4098 12/06/10 1301  NA -- 140  K -- 4.2  CL -- 101  CO2 -- 29  GLUCOSE -- 139*  BUN -- 25*  CREATININE 1.58* 1.74*  CALCIUM -- 9.7  MG -- --  PHOS -- --   Liver Function Tests:  Lab 12/06/10 1955  AST 22  ALT 17  ALKPHOS 110  BILITOT 0.4  PROT 6.9  ALBUMIN 3.6   No results found for this basename: LIPASE:2,AMYLASE:2 in the last 168 hours CBC:  Lab 12/06/10 1955 12/06/10 1301  WBC 7.4 10.0  NEUTROABS -- 6.2  HGB 11.3* 12.1*  HCT 34.2* 36.1*  MCV 96.6 96.8  PLT 203 223   Cardiac Enzymes:  Lab 12/07/10 0540 12/06/10 1301  CKTOTAL 94 148  CKMB 3.3 3.9  CKMBINDEX -- --  TROPONINI <0.30 <0.30   BNP:  Lab 12/06/10 1956 12/06/10 1302  POCBNP 1336.0* 1157.0*   D-Dimer:  Lab 12/06/10 1955  DDIMER 0.79*   CBG: No results found for this basename: GLUCAP:6 in the last 168 hours Hemoglobin A1C: No results found for this basename: HGBA1C in the  last 168 hours Fasting Lipid Panel: No results found for this basename: CHOL,HDL,LDLCALC,TRIG,CHOLHDL,LDLDIRECT in the last 119 hours Thyroid Function Tests: No results found for this basename: TSH,T4TOTAL,FREET4,T3FREE,THYROIDAB in the last 168 hours Anemia Panel: No results found for this basename: VITAMINB12,FOLATE,FERRITIN,TIBC,IRON,RETICCTPCT in the last 168 hours  Micro Results: No results found for this or any previous visit (from the past 240 hour(s)). Studies/Results: Dg Chest Portable 1 View  12/06/2010  *RADIOLOGY REPORT*  Clinical Data: Chest pain.  Shortness of breath.  PORTABLE CHEST - 1 VIEW  Comparison: 06/21/2009  Findings: 1315 hours. Hyperexpansion is consistent with emphysema. Chronic atelectasis or scarring seen at the right lung base. The cardiopericardial silhouette is enlarged. Telemetry leads overlie the chest.  IMPRESSION: Cardiomegaly with emphysema.  No edema or focal pneumonia.  Original Report Authenticated By: ERIC A. MANSELL, M.D.   Scheduled Meds:   . sodium chloride   Intravenous Once  . albuterol  2.5 mg Nebulization Once  . albuterol  2.5 mg Nebulization TID  . aspirin  81 mg Oral Daily  . azithromycin  500 mg Oral Daily   Followed by  . azithromycin  250 mg Oral Daily  . budesonide-formoterol  2 puff Inhalation BID  .  diltiazem  180 mg Oral Daily  . enoxaparin  30 mg Subcutaneous Q24H  . fluticasone  2 spray Each Nare Daily  . furosemide  40 mg Oral Daily  . ipratropium  0.5 mg Nebulization Once  . loratadine  10 mg Oral Daily  . metoprolol  50 mg Oral Daily  . multivitamins ther. w/minerals  1 tablet Oral Daily  . predniSONE  40 mg Oral Once  . predniSONE  40 mg Oral BID  . terazosin  5 mg Oral QHS  . DISCONTD: albuterol  2.5 mg Nebulization Q6H   Continuous Infusions:  PRN Meds:.acetaminophen, acetaminophen, albuterol, alum & mag hydroxide-simeth, dextromethorphan-guaiFENesin, HYDROcodone-acetaminophen, morphine, nitroGLYCERIN, ondansetron  (ZOFRAN) IV, ondansetron, senna, traZODone Assessment/Plan: Principal Problem:  *Chest pain his d-dimer is elevated. I will order a VQ scan. No CT angiogram at this point do to his renal insufficiency. I doubt his pain is cardiac. He has ruled out for MI. Consider outpatient Cardiolite, but patient is now medically stable for discharge. I will change status to inpatient. Active Problems:  HYPERTENSION  Acute bronchitis with COPD exacerbation: Patient is moving less air today. I will start prednisone 40 mg twice a day. We'll add Atrovent.  Dyspnea  HYPERLIPIDEMIA  TREMOR, ESSENTIAL  ATHEROSCLEROTIC CARDIOVASCULAR DISEASE  CKD (chronic kidney disease), stage III   LOS: 1 day   Arick Mareno L 12/07/2010, 12:23 PM

## 2010-12-08 ENCOUNTER — Inpatient Hospital Stay (HOSPITAL_COMMUNITY): Payer: Medicare Other

## 2010-12-08 DIAGNOSIS — I517 Cardiomegaly: Secondary | ICD-10-CM

## 2010-12-08 LAB — BASIC METABOLIC PANEL
CO2: 29 mEq/L (ref 19–32)
Calcium: 9.4 mg/dL (ref 8.4–10.5)
Creatinine, Ser: 1.74 mg/dL — ABNORMAL HIGH (ref 0.50–1.35)
Glucose, Bld: 145 mg/dL — ABNORMAL HIGH (ref 70–99)
Sodium: 141 mEq/L (ref 135–145)

## 2010-12-08 LAB — HEMOGLOBIN A1C
Hgb A1c MFr Bld: 6.1 % — ABNORMAL HIGH (ref <5.7)
Mean Plasma Glucose: 128 mg/dL — ABNORMAL HIGH (ref <117)

## 2010-12-08 MED ORDER — LORATADINE 10 MG PO TABS: 10.0000 mg | ORAL_TABLET | Freq: Every day | ORAL | Status: DC

## 2010-12-08 MED ORDER — ACETAMINOPHEN 325 MG PO TABS: 650.0000 mg | ORAL_TABLET | Freq: Four times a day (QID) | ORAL | Status: AC | PRN

## 2010-12-08 MED ORDER — ATORVASTATIN CALCIUM 40 MG PO TABS: 40.0000 mg | ORAL_TABLET | Freq: Every day | ORAL | Status: DC

## 2010-12-08 MED ORDER — PREDNISONE (PAK) 10 MG PO TABS: 10.0000 mg | ORAL_TABLET | Freq: Every day | ORAL | Status: AC

## 2010-12-08 MED ORDER — DM-GUAIFENESIN ER 30-600 MG PO TB12: 1.0000 | ORAL_TABLET | Freq: Two times a day (BID) | ORAL | Status: AC | PRN

## 2010-12-08 MED ORDER — IPRATROPIUM-ALBUTEROL 18-103 MCG/ACT IN AERO: 2.0000 | INHALATION_SPRAY | RESPIRATORY_TRACT | Status: DC | PRN

## 2010-12-08 MED ORDER — AZITHROMYCIN 250 MG PO TABS: 250.0000 mg | ORAL_TABLET | Freq: Every day | ORAL | Status: DC

## 2010-12-08 MED ORDER — TECHNETIUM TO 99M ALBUMIN AGGREGATED
5.0000 | Freq: Once | INTRAVENOUS | Status: AC | PRN
  Administered 2010-12-08: 5 via INTRAVENOUS

## 2010-12-08 MED ORDER — XENON XE 133 GAS
10.0000 | GAS_FOR_INHALATION | Freq: Once | RESPIRATORY_TRACT | Status: AC | PRN
  Administered 2010-12-08: 27.4 via RESPIRATORY_TRACT

## 2010-12-08 NOTE — Progress Notes (Signed)
*  PRELIMINARY RESULTS* Echocardiogram 2D Echocardiogram has been performed.  [employer] 12/08/2010, 10:06 AM

## 2010-12-08 NOTE — Progress Notes (Signed)
UR Chart Review Completed  

## 2010-12-08 NOTE — Discharge Summary (Signed)
Physician Discharge Summary  Patient ID: [name] MRN: [id number] DOB/AGE: Mar 21, 1922 77 y.o.  Admit date: 12/06/2010 Discharge date: 12/08/2010  Discharge Diagnoses:  Principal Problem:  *Chest pain Active Problems:  HYPERTENSION  Acute bronchitis COPD exacerbation. Allergic rhinitis  Dyspnea  HYPERLIPIDEMIA  TREMOR, ESSENTIAL  ATHEROSCLEROTIC CARDIOVASCULAR DISEASE  CKD (chronic kidney disease), stage III   Current Discharge Medication List    START taking these medications   Details  acetaminophen (TYLENOL) 325 MG tablet Take 2 tablets (650 mg total) by mouth every 6 (six) hours as needed (or Fever >/= 101). Qty: 30 tablet    azithromycin (ZITHROMAX) 250 MG tablet Take 1 tablet (250 mg total) by mouth daily. Qty: 2 each, Refills: 0    dextromethorphan-guaiFENesin (MUCINEX DM) 30-600 MG per 12 hr tablet Take 1 tablet by mouth 2 (two) times daily as needed (Cough).    loratadine (CLARITIN) 10 MG tablet Take 1 tablet (10 mg total) by mouth daily.    predniSONE (STERAPRED UNI-PAK) 10 MG tablet Take 1 tablet (10 mg total) by mouth daily. 3 tablets daily then decrease by 1 tablet every 2 days until off Qty: 12 tablet, Refills: 0      CONTINUE these medications which have CHANGED   Details  albuterol-ipratropium (COMBIVENT) 18-103 MCG/ACT inhaler Inhale 2 puffs into the lungs every 4 (four) hours as needed for wheezing or shortness of breath.    atorvastatin (LIPITOR) 40 MG tablet Take 1 tablet (40 mg total) by mouth daily.      CONTINUE these medications which have NOT CHANGED   Details  aspirin 81 MG tablet Take 81 mg by mouth daily.      Budesonide-Formoterol Fumarate (SYMBICORT IN) Inhale 2 puffs into the lungs 2 (two) times daily.      diltiazem (CARDIZEM CD) 180 MG 24 hr capsule Take 180 mg by mouth daily.      fluticasone (FLONASE) 50 MCG/ACT nasal spray Place 2 sprays into the nose daily.      furosemide (LASIX) 20 MG tablet Take 20-40 mg by mouth 3  (three) times daily. Patient takes 40mg  in the morning and 20mg  at noon     metoprolol (LOPRESSOR) 50 MG tablet Take 50 mg by mouth daily.     Multiple Vitamins-Minerals (MULTIVITAMIN WITH MINERALS) tablet Take 1 tablet by mouth daily.      terazosin (HYTRIN) 5 MG capsule Take 5 mg by mouth at bedtime.      tetrahydrozoline-zinc (VISINE-AC) 0.05-0.25 % ophthalmic solution Place 1 drop into both eyes daily.      VENTOLIN HFA 108 (90 BASE) MCG/ACT inhaler Inhale 2 puffs into the lungs 4 (four) times daily.     nitroGLYCERIN (NITROSTAT) 0.4 MG SL tablet Place 1 tablet (0.4 mg total) under the tongue every 5 (five) minutes as needed for chest pain. Qty: 25 tablet, Refills: 2      STOP taking these medications     PRESCRIPTION MEDICATION         Discharge Orders    Future Appointments: Provider: Department: Dept Phone: Center:   12/30/2010 10:00 AM [facility]  None     Future Orders Please Complete By Expires   Diet - low sodium heart healthy      Increase activity slowly           Disposition: Home or Self Care  Discharged Condition: Stable  Consults:   none  Labs:   Results for orders placed during the hospital encounter of 12/06/10 (  from the past 48 hour(s))  CBC     Status: Abnormal   Collection Time   12/06/10  7:55 PM      Component Value Range Comment   WBC 7.4  4.0 - 10.5 (K/uL)    RBC 3.54 (*) 4.22 - 5.81 (MIL/uL)    Hemoglobin 11.3 (*) 13.0 - 17.0 (g/dL)    HCT 04.5 (*) 40.9 - 52.0 (%)    MCV 96.6  78.0 - 100.0 (fL)    MCH 31.9  26.0 - 34.0 (pg)    MCHC 33.0  30.0 - 36.0 (g/dL)    RDW 81.1  91.4 - 78.2 (%)    Platelets 203  150 - 400 (K/uL)   CREATININE, SERUM     Status: Abnormal   Collection Time   12/06/10  7:55 PM      Component Value Range Comment   Creatinine, Ser 1.58 (*) 0.50 - 1.35 (mg/dL)    GFR calc non Af Amer 42 (*) >60 (mL/min)    GFR calc Af Amer 51 (*) >60 (mL/min)   HEMOGLOBIN A1C     Status: Abnormal   Collection Time    12/06/10  7:55 PM      Component Value Range Comment   Hemoglobin A1C 6.1 (*) <5.7 (%)    Mean Plasma Glucose 128 (*) <117 (mg/dL)   TSH     Status: Normal   Collection Time   12/06/10  7:55 PM      Component Value Range Comment   TSH 2.792  0.350 - 4.500 (uIU/mL)   HEPATIC FUNCTION PANEL     Status: Normal   Collection Time   12/06/10  7:55 PM      Component Value Range Comment   Total Protein 6.9  6.0 - 8.3 (g/dL)    Albumin 3.6  3.5 - 5.2 (g/dL)    AST 22  0 - 37 (U/L)    ALT 17  0 - 53 (U/L)    Alkaline Phosphatase 110  39 - 117 (U/L)    Total Bilirubin 0.4  0.3 - 1.2 (mg/dL)    Bilirubin, Direct 0.1  0.0 - 0.3 (mg/dL)    Indirect Bilirubin 0.3  0.3 - 0.9 (mg/dL)   D-DIMER, QUANTITATIVE     Status: Abnormal   Collection Time   12/06/10  7:55 PM      Component Value Range Comment   D-Dimer, Quant 0.79 (*) 0.00 - 0.48 (ug/mL-FEU)   PRO B NATRIURETIC PEPTIDE     Status: Abnormal   Collection Time   12/06/10  7:56 PM      Component Value Range Comment   BNP, POC 1336.0 (*) 0 - 450 (pg/mL)   LIPID PANEL     Status: Normal   Collection Time   12/07/10  5:26 AM      Component Value Range Comment   Cholesterol 157  0 - 200 (mg/dL)    Triglycerides 40  <956 (mg/dL)    HDL 93  >21 (mg/dL)    Total CHOL/HDL Ratio 1.7      VLDL 8  0 - 40 (mg/dL)    LDL Cholesterol 56  0 - 99 (mg/dL)   CARDIAC PANEL(CRET KIN+CKTOT+MB+TROPI)     Status: Normal   Collection Time   12/07/10  5:40 AM      Component Value Range Comment   Total CK 94  7 - 232 (U/L)    CK, MB 3.3  0.3 - 4.0 (ng/mL)  Troponin I <0.30  <0.30 (ng/mL)    Relative Index RELATIVE INDEX IS INVALID  0.0 - 2.5    CARDIAC PANEL(CRET KIN+CKTOT+MB+TROPI)     Status: Abnormal   Collection Time   12/07/10 11:40 AM      Component Value Range Comment   Total CK 113  7 - 232 (U/L)    CK, MB 3.9  0.3 - 4.0 (ng/mL)    Troponin I <0.30  <0.30 (ng/mL)    Relative Index 3.5 (*) 0.0 - 2.5    BASIC METABOLIC PANEL     Status: Abnormal     Collection Time   12/08/10  4:49 AM      Component Value Range Comment   Sodium 141  135 - 145 (mEq/L)    Potassium 4.1  3.5 - 5.1 (mEq/L)    Chloride 104  96 - 112 (mEq/L)    CO2 29  19 - 32 (mEq/L)    Glucose, Bld 145 (*) 70 - 99 (mg/dL)    BUN 29 (*) 6 - 23 (mg/dL)    Creatinine, Ser 4.09 (*) 0.50 - 1.35 (mg/dL)    Calcium 9.4  8.4 - 10.5 (mg/dL)    GFR calc non Af Amer 37 (*) >60 (mL/min)    GFR calc Af Amer 45 (*) >60 (mL/min)     Diagnostics:  Dg Chest 2 View  12/08/2010  *RADIOLOGY REPORT*  Clinical Data: Chest pain, dyspnea, correlation with VQ scan  CHEST - 2 VIEW  Comparison: 12/06/2010  Findings: Normal heart size post CABG. Atherosclerotic calcification aorta. Mediastinal contours and pulmonary vascularity normal. Emphysematous changes with minimal peribronchial thickening. Atelectasis versus scarring right lung base. No definite infiltrate or effusion. Bones demineralized. End plate spur formation thoracic spine.  IMPRESSION: Post CABG. Emphysematous and minimal bronchitic changes with atelectasis versus scarring at right lung base.  Original Report Authenticated By: Lollie Marrow, M.D.   Nm Pulmonary Per & Vent  12/08/2010  *RADIOLOGY REPORT*  Clinical Data:  Chest pain, dyspnea  NUCLEAR MEDICINE VENTILATION - PERFUSION LUNG SCAN  Technique:  Wash-in, equilibrium, and wash-out phase ventilation images were obtained using Xe-133 gas.  Perfusion images were obtained in multiple projections after intravenous injection of Tc- 80m MAA.  Radiopharmaceuticals:  27.4 mCi Xe-133 gas and 5.0 mCi Tc-52m MAA.  Comparison:  None Correlation:  Chest radiograph 12/08/2010  Findings: Ventilation exam in anterior and posterior projections demonstrates an area of diminished ventilation at the lateral aspect of the right upper lobe near the mid lung. Minimal xenon trapping at right lung base on washout images.  Perfusion lung scan in eight projections demonstrates minimal peripheral irregularity  perfusion in both lungs. No discrete segmental or subsegmental perfusion defects identified.  Findings are suggestive of parenchymal lung disease/COPD and represent a very low probability for pulmonary embolism.  IMPRESSION: Very low probability for pulmonary embolism.  Original Report Authenticated By: Lollie Marrow, M.D.   Dg Chest Portable 1 View  12/06/2010  *RADIOLOGY REPORT*  Clinical Data: Chest pain.  Shortness of breath.  PORTABLE CHEST - 1 VIEW  Comparison: 06/21/2009  Findings: 1315 hours. Hyperexpansion is consistent with emphysema. Chronic atelectasis or scarring seen at the right lung base. The cardiopericardial silhouette is enlarged. Telemetry leads overlie the chest.  IMPRESSION: Cardiomegaly with emphysema.  No edema or focal pneumonia.  Original Report Authenticated By: ERIC A. MANSELL, M.D.   EKG: Normal sinus rhythm with PVCs   Hospital Course: Please see H&P for complete admission details. The patient  is a pleasant active 77 year old white male who presented to the emergency room with chest pain. He also had shortness of breath. He had a cough productive of clear sputum for a month or 2. He has chronic rhinorrhea. He has a history of presumed COPD. He no longer smokes. His primary care physician had adjusted his inhalers but the patient did not notice a difference. The chest pain was somewhat atypical. In the emergency room, he had an aunt concerning EKG and labs. He did have wheezing. He was started on azithromycin for acute bronchitis. He was started on bronchodilators but eventually required prednisone therapy. His chest pain did not return. His cough, shortness of breath, dyspnea on exertion is much improved. At the time of discharge, he was able to ambulate the nursing unit which he had not been able to do for several months. VQ scan showed low probability for pulmonary embolus. She has a history of heart disease, but his pain was most likely due to his acute bronchitis and  wheezing. Nevertheless, he has not had a stress test for several years. I will refer him back to Dr. Dietrich Pates after resolution of his acute bronchitis and COPD exacerbation. A stress test as an outpatient would be reasonable, but deferred to Dr. Dietrich Pates. Total time on the day of discharge is greater than 30 minutes.  Discharge Exam: Blood pressure 116/78, pulse 77, temperature 97.9 F (36.6 C), temperature source Oral, resp. rate 20, height 5\' 10"  (1.778 m), weight 83.9 kg (184 lb 15.5 oz), SpO2 94.00%. Gen.: Patient is much more comfortable today. He has no respiratory distress. Lungs: Much improved air movement. Slight wheeze. No rhonchi or rales. Cardiovascular: Regular rate and rhythm without murmurs counts wraps Abdomen soft nontender nondistended extremities no clubbing cyanosis or edema   Signed: Yazhini Mcaulay L 12/08/2010, 3:14 PM

## 2010-12-08 NOTE — Progress Notes (Signed)
Discharge instructions reviewed with pt. and wife-reviewed medication reconciliation-explained Lasix dosing of 40 mg in am and 20 mg at lunch. Pt. And wife verbalized understanding. Rxs given for prednisone taper and Zithromax. Pt. and wife verbalized understanding, no questions or concerns noted. Pt. Escorted by NT via wheelchair.

## 2010-12-22 NOTE — Progress Notes (Signed)
Encounter addended by: Clarene Critchley on: 12/22/2010 12:05 PM<BR>     Documentation filed: Flowsheet VN

## 2010-12-29 ENCOUNTER — Other Ambulatory Visit: Payer: Self-pay | Admitting: Cardiovascular Disease

## 2010-12-29 DIAGNOSIS — I6529 Occlusion and stenosis of unspecified carotid artery: Secondary | ICD-10-CM

## 2010-12-30 ENCOUNTER — Encounter (INDEPENDENT_AMBULATORY_CARE_PROVIDER_SITE_OTHER): Payer: Medicare Other | Admitting: *Deleted

## 2010-12-30 DIAGNOSIS — I6529 Occlusion and stenosis of unspecified carotid artery: Secondary | ICD-10-CM

## 2011-01-15 LAB — DIFFERENTIAL
Basophils Absolute: 0 10*3/uL (ref 0.0–0.1)
Basophils Relative: 1 % (ref 0–1)
Eosinophils Relative: 3 % (ref 0–5)
Monocytes Absolute: 1 10*3/uL (ref 0.1–1.0)
Neutro Abs: 6 10*3/uL (ref 1.7–7.7)

## 2011-01-15 LAB — COMPREHENSIVE METABOLIC PANEL
AST: 22 U/L (ref 0–37)
Albumin: 3.6 g/dL (ref 3.5–5.2)
Alkaline Phosphatase: 68 U/L (ref 39–117)
BUN: 32 mg/dL — ABNORMAL HIGH (ref 6–23)
CO2: 27 mEq/L (ref 19–32)
Chloride: 106 mEq/L (ref 96–112)
GFR calc non Af Amer: 39 mL/min — ABNORMAL LOW (ref >60)
Potassium: 4.5 mEq/L (ref 3.5–5.1)
Total Bilirubin: 0.6 mg/dL (ref 0.3–1.2)

## 2011-01-15 LAB — URINALYSIS, ROUTINE W REFLEX MICROSCOPIC
Bilirubin Urine: NEGATIVE
Hgb urine dipstick: NEGATIVE
Protein, ur: NEGATIVE mg/dL
Urobilinogen, UA: 0.2 mg/dL (ref 0.0–1.0)

## 2011-01-15 LAB — CBC
HCT: 29.2 % — ABNORMAL LOW (ref 39.0–52.0)
MCV: 94.3 fL (ref 78.0–100.0)
Platelets: 203 10*3/uL (ref 150–400)
RBC: 3.09 MIL/uL — ABNORMAL LOW (ref 4.22–5.81)
WBC: 9.6 10*3/uL (ref 4.0–10.5)

## 2011-01-15 LAB — LIPASE, BLOOD: Lipase: 24 U/L (ref 11–59)

## 2011-01-21 LAB — DIFFERENTIAL
Basophils Absolute: 0.1
Basophils Relative: 1
Monocytes Absolute: 1.1 — ABNORMAL HIGH
Neutro Abs: 4
Neutrophils Relative %: 41 — ABNORMAL LOW

## 2011-01-21 LAB — BASIC METABOLIC PANEL
BUN: 27 — ABNORMAL HIGH
CO2: 25
Calcium: 8.9
Creatinine, Ser: 1.56 — ABNORMAL HIGH
Glucose, Bld: 123 — ABNORMAL HIGH

## 2011-01-21 LAB — POCT CARDIAC MARKERS: Myoglobin, poc: 105

## 2011-01-21 LAB — CBC
MCHC: 33.8
Platelets: 235
RDW: 13.7

## 2011-03-10 ENCOUNTER — Emergency Department (HOSPITAL_COMMUNITY): Payer: Medicare Other

## 2011-03-10 ENCOUNTER — Encounter (HOSPITAL_COMMUNITY): Payer: Self-pay | Admitting: Emergency Medicine

## 2011-03-10 ENCOUNTER — Emergency Department (HOSPITAL_COMMUNITY)
Admission: EM | Admit: 2011-03-10 | Discharge: 2011-03-10 | Disposition: A | Discharge status: Home / Self Care | Payer: Medicare Other | Attending: Emergency Medicine | Admitting: Emergency Medicine

## 2011-03-10 DIAGNOSIS — Z87891 Personal history of nicotine dependence: Secondary | ICD-10-CM | POA: Insufficient documentation

## 2011-03-10 DIAGNOSIS — I2510 Atherosclerotic heart disease of native coronary artery without angina pectoris: Secondary | ICD-10-CM | POA: Insufficient documentation

## 2011-03-10 DIAGNOSIS — R51 Headache: Secondary | ICD-10-CM | POA: Insufficient documentation

## 2011-03-10 DIAGNOSIS — IMO0002 Reserved for concepts with insufficient information to code with codable children: Secondary | ICD-10-CM | POA: Insufficient documentation

## 2011-03-10 DIAGNOSIS — M1710 Unilateral primary osteoarthritis, unspecified knee: Secondary | ICD-10-CM | POA: Insufficient documentation

## 2011-03-10 DIAGNOSIS — G319 Degenerative disease of nervous system, unspecified: Secondary | ICD-10-CM | POA: Insufficient documentation

## 2011-03-10 DIAGNOSIS — W1789XA Other fall from one level to another, initial encounter: Secondary | ICD-10-CM | POA: Insufficient documentation

## 2011-03-10 DIAGNOSIS — Y92009 Unspecified place in unspecified non-institutional (private) residence as the place of occurrence of the external cause: Secondary | ICD-10-CM | POA: Insufficient documentation

## 2011-03-10 DIAGNOSIS — T148XXA Other injury of unspecified body region, initial encounter: Secondary | ICD-10-CM

## 2011-03-10 DIAGNOSIS — I129 Hypertensive chronic kidney disease with stage 1 through stage 4 chronic kidney disease, or unspecified chronic kidney disease: Secondary | ICD-10-CM | POA: Insufficient documentation

## 2011-03-10 DIAGNOSIS — E785 Hyperlipidemia, unspecified: Secondary | ICD-10-CM | POA: Insufficient documentation

## 2011-03-10 DIAGNOSIS — W19XXXA Unspecified fall, initial encounter: Secondary | ICD-10-CM

## 2011-03-10 DIAGNOSIS — Z859 Personal history of malignant neoplasm, unspecified: Secondary | ICD-10-CM | POA: Insufficient documentation

## 2011-03-10 DIAGNOSIS — M25529 Pain in unspecified elbow: Secondary | ICD-10-CM | POA: Insufficient documentation

## 2011-03-10 DIAGNOSIS — R062 Wheezing: Secondary | ICD-10-CM | POA: Insufficient documentation

## 2011-03-10 DIAGNOSIS — Z951 Presence of aortocoronary bypass graft: Secondary | ICD-10-CM | POA: Insufficient documentation

## 2011-03-10 DIAGNOSIS — N182 Chronic kidney disease, stage 2 (mild): Secondary | ICD-10-CM | POA: Insufficient documentation

## 2011-03-10 LAB — BASIC METABOLIC PANEL
BUN: 27 mg/dL — ABNORMAL HIGH (ref 6–23)
Calcium: 9.2 mg/dL (ref 8.4–10.5)
Creatinine, Ser: 1.59 mg/dL — ABNORMAL HIGH (ref 0.50–1.35)
GFR calc non Af Amer: 37 mL/min — ABNORMAL LOW (ref >90)
Glucose, Bld: 121 mg/dL — ABNORMAL HIGH (ref 70–99)
Sodium: 138 mEq/L (ref 135–145)

## 2011-03-10 LAB — CBC
MCH: 31.4 pg (ref 26.0–34.0)
MCV: 96.8 fL (ref 78.0–100.0)
Platelets: 222 10*3/uL (ref 150–400)
RBC: 3.44 MIL/uL — ABNORMAL LOW (ref 4.22–5.81)

## 2011-03-10 LAB — DIFFERENTIAL
Eosinophils Absolute: 0.2 10*3/uL (ref 0.0–0.7)
Eosinophils Relative: 3 % (ref 0–5)
Lymphs Abs: 2.5 10*3/uL (ref 0.7–4.0)
Monocytes Relative: 11 % (ref 3–12)

## 2011-03-10 MED ORDER — ALBUTEROL SULFATE (5 MG/ML) 0.5% IN NEBU
2.5000 mg | INHALATION_SOLUTION | Freq: Once | RESPIRATORY_TRACT | Status: AC
  Administered 2011-03-10: 2.5 mg via RESPIRATORY_TRACT
  Filled 2011-03-10: qty 0.5

## 2011-03-10 MED ORDER — SODIUM CHLORIDE 0.9 % IN NEBU
INHALATION_SOLUTION | RESPIRATORY_TRACT | Status: AC
  Administered 2011-03-10: 3 mL
  Filled 2011-03-10: qty 3

## 2011-03-10 NOTE — ED Provider Notes (Signed)
History  This chart was scribed for Celene Kras, MD by Bennett Scrape. This patient was seen in room [facility]07/[facility]07 and the patient's care was started at 1:30PM.  CSN: [id number] Arrival date & time: 03/10/2011  1:09 PM   First MD Initiated Contact with Patient 03/10/11 1315      Chief Complaint  Patient presents with  . Fall  . Loss of Consciousness     The history is provided by the patient. No language interpreter was used.   [name] is a 77 y.o. male who presents to the Emergency Department complaining of a fall injury to the back of his head that occurred when the pt lost his balance and fell backwards while stepping up onto his back porch. Pt also c/o mild left elbow pain. Pt has mild blood loss from the abrasion wound to the back of his head and states that he was ambulatory after the fall. Son reports that the pt fell from about 3 feet up off the ground. Pt denies LOC, neck pain and back pain. Pt denies smoking and states that he uses inhalers at home for respiratory problems.  Past Medical History  Diagnosis Date  . ASCVD (arteriosclerotic cardiovascular disease)      CABG in 04/1993; and negative stress nuclear study in 08/2001  . Hyperlipidemia   . Syncope   . Hypertension   . Tobacco abuse, in remission     40 pack years; discontinued in 1980  . Cerebrovascular disease     Right carotid bruit-40-69% left internal carotid artery stenosis in 4/06; followed [facility]  . Chronic kidney disease, stage II (mild)     Creatinine-1.6 in 09/2008  . Degenerative joint disease of knee, left   . Normocytic anemia   . Pulmonary disease   . Cancer     Past Surgical History  Procedure Date  . Coronary artery bypass graft 1995  . Colonoscopy w/ polypectomy 1985  . Pilonidal cyst excision 1948  . Lesion excision     Family History  Problem Relation Age of Onset  . Cancer Brother   . Heart attack Brother     History  Substance Use Topics  . Smoking status: Former Smoker --  1.0 packs/day for 40 years    Types: Cigarettes    Quit date: 08/14/1973  . Smokeless tobacco: Former Neurosurgeon    Types: Chew    Quit date: 12/05/1980  . Alcohol Use: No      Review of Systems A complete 10 system review of systems was obtained and is otherwise negative except as noted in the HPI.   Allergies  Beta adrenergic blockers; Penicillins; and Sulfonamide derivatives  Home Medications   Current Outpatient Rx  Name Route Sig Dispense Refill  . IPRATROPIUM-ALBUTEROL 18-103 MCG/ACT IN AERO Inhalation Inhale 2 puffs into the lungs every 4 (four) hours as needed for wheezing or shortness of breath.    . ASPIRIN 81 MG PO TABS Oral Take 81 mg by mouth daily.      . ATORVASTATIN CALCIUM 40 MG PO TABS Oral Take 1 tablet (40 mg total) by mouth daily.    . AZITHROMYCIN 250 MG PO TABS Oral Take 1 tablet (250 mg total) by mouth daily. 2 each 0  . SYMBICORT IN Inhalation Inhale 2 puffs into the lungs 2 (two) times daily.      Marland Kitchen DILTIAZEM HCL COATED BEADS 180 MG PO CP24 Oral Take 180 mg by mouth daily.      Marland Kitchen  FLUTICASONE PROPIONATE 50 MCG/ACT NA SUSP Nasal Place 2 sprays into the nose daily.      . FUROSEMIDE 20 MG PO TABS Oral Take 20-40 mg by mouth 3 (three) times daily. Patient takes 40mg  in the morning and 20mg  at noon     . LORATADINE 10 MG PO TABS Oral Take 1 tablet (10 mg total) by mouth daily.      Over-the-counter. For runny nose  . METOPROLOL TARTRATE 50 MG PO TABS Oral Take 50 mg by mouth daily.     . MULTI-VITAMIN/MINERALS PO TABS Oral Take 1 tablet by mouth daily.      Marland Kitchen NITROGLYCERIN 0.4 MG SL SUBL Sublingual Place 1 tablet (0.4 mg total) under the tongue every 5 (five) minutes as needed for chest pain. 25 tablet 2  . TERAZOSIN HCL 5 MG PO CAPS Oral Take 5 mg by mouth at bedtime.      Jeananne Rama SULFATE 0.05-0.25 % OP SOLN Both Eyes Place 1 drop into both eyes daily.      . VENTOLIN HFA 108 (90 BASE) MCG/ACT IN AERS Inhalation Inhale 2 puffs into the lungs 4 (four)  times daily.       Triage Vitals: BP 166/58  Pulse 64  Temp(Src) 97.8 F (36.6 C) (Oral)  Resp 22  Ht 5\' 10"  (1.778 m)  Wt 186 lb (84.369 kg)  BMI 26.69 kg/m2  SpO2 97%  Physical Exam  Nursing note and vitals reviewed. Constitutional: He is oriented to person, place, and time. He appears well-developed and well-nourished. No distress.  HENT:  Head: Normocephalic and atraumatic.  Right Ear: External ear normal.  Left Ear: External ear normal.       Small abrasion to the back of his scalp  Eyes: Conjunctivae are normal. Right eye exhibits no discharge. Left eye exhibits no discharge. No scleral icterus.  Neck: Neck supple. No tracheal deviation present.  Cardiovascular: Normal rate, regular rhythm and intact distal pulses.   Pulmonary/Chest: Effort normal. No stridor. No respiratory distress. He has wheezes (faint expiratory wheezes). He has no rales.  Abdominal: Soft. Bowel sounds are normal. He exhibits no distension. There is no tenderness. There is no rebound and no guarding.  Musculoskeletal: He exhibits no edema and no tenderness.       No tenderness in cervical, thoraic and lumbar spine, no pain in extremities except for some mild pain in left elbow, pelvis is non-tender  Neurological: He is alert and oriented to person, place, and time. He has normal strength. No sensory deficit. Cranial nerve deficit:  no gross defecits noted. He exhibits normal muscle tone. He displays no seizure activity. Coordination normal.  Skin: Skin is warm and dry. No rash noted.  Psychiatric: He has a normal mood and affect.    ED Course  Procedures (including critical care time)  DIAGNOSTIC STUDIES: Oxygen Saturation is 97% on room air, adequate by my interpretation.    COORDINATION OF CARE: 1:32PM-Discussed treatment plan with patient at bedside and patient agreed to plan.  Labs Reviewed  CBC - Abnormal; Notable for the following:    RBC 3.44 (*)    Hemoglobin 10.8 (*)    HCT 33.3 (*)      All other components within normal limits  BASIC METABOLIC PANEL - Abnormal; Notable for the following:    Glucose, Bld 121 (*)    BUN 27 (*)    Creatinine, Ser 1.59 (*)    GFR calc non Af Amer 37 (*)  GFR calc Af Amer 43 (*)    All other components within normal limits  DIFFERENTIAL   Dg Elbow Complete Left  03/10/2011  *RADIOLOGY REPORT*  Clinical Data: Left elbow pain following a fall.  LEFT ELBOW - COMPLETE 3+ VIEW  Comparison: None.  Findings: Tiny intra-articular calcifications.  No fracture, dislocation or definite effusion seen.  IMPRESSION: Tiny intra-articular loose bodies.  No fracture.  Original Report Authenticated By: Darrol Angel, M.D.   Ct Head Wo Contrast  03/10/2011  *RADIOLOGY REPORT*  Clinical Data:  Fall, hit back of head.  Loss of consciousness. Headache.  CT HEAD WITHOUT CONTRAST CT CERVICAL SPINE WITHOUT CONTRAST  Technique:  Multidetector CT imaging of the head and cervical spine was performed following the standard protocol without intravenous contrast.  Multiplanar CT image reconstructions of the cervical spine were also generated.  Comparison:  02/07/2007  CT HEAD  Findings: Mild age related volume loss.  Mild chronic small vessel disease in the deep white matter. No acute intracranial abnormality.  Specifically, no hemorrhage, hydrocephalus, mass lesion, acute infarction, or significant intracranial injury.  No acute calvarial abnormality.  IMPRESSION: No acute intracranial abnormality.  Atrophy, chronic microvascular disease.  CT CERVICAL SPINE  Findings: Diffuse degenerative disc disease and facet disease throughout the cervical spine.  Anterolisthesis of C7 on T1 related to facet disease.  Moderate severe bilateral multilevel neural foraminal narrowing.  No fracture.  Prevertebral soft tissues are normal.  Heavily calcified carotid arteries.  No epidural or paraspinal hematoma.  IMPRESSION: Severe spondylosis.  Heavily calcified carotid arteries.  No acute  findings.  Original Report Authenticated By: Cyndie Chime, M.D.   Ct Cervical Spine Wo Contrast  03/10/2011  *RADIOLOGY REPORT*  Clinical Data:  Fall, hit back of head.  Loss of consciousness. Headache.  CT HEAD WITHOUT CONTRAST CT CERVICAL SPINE WITHOUT CONTRAST  Technique:  Multidetector CT imaging of the head and cervical spine was performed following the standard protocol without intravenous contrast.  Multiplanar CT image reconstructions of the cervical spine were also generated.  Comparison:  02/07/2007  CT HEAD  Findings: Mild age related volume loss.  Mild chronic small vessel disease in the deep white matter. No acute intracranial abnormality.  Specifically, no hemorrhage, hydrocephalus, mass lesion, acute infarction, or significant intracranial injury.  No acute calvarial abnormality.  IMPRESSION: No acute intracranial abnormality.  Atrophy, chronic microvascular disease.  CT CERVICAL SPINE  Findings: Diffuse degenerative disc disease and facet disease throughout the cervical spine.  Anterolisthesis of C7 on T1 related to facet disease.  Moderate severe bilateral multilevel neural foraminal narrowing.  No fracture.  Prevertebral soft tissues are normal.  Heavily calcified carotid arteries.  No epidural or paraspinal hematoma.  IMPRESSION: Severe spondylosis.  Heavily calcified carotid arteries.  No acute findings.  Original Report Authenticated By: Cyndie Chime, M.D.     MDM  Patient presents with mechanical fall there is no evidence of any serious injuries.  Patient was able to ambulate somewhat after the fall. He'll be discharged home in the care of his family.   Diagnosis: #1 fall #2 contusion   I personally performed the services described in this documentation, which was scribed in my presence.  The recorded information has been reviewed and considered.   Celene Kras, MD 03/10/11 [phone]

## 2011-03-10 NOTE — ED Notes (Signed)
Per son patient fell about 3-3 1/2 feet off porch this morning and hit head. LOC for 1-2 minutes. Patient had dizziness afterwards. Denies any at this time. Patient also hit neck and back but denies any pain in either. Patient has small laceration to top of head. Per patient pain in top of head.

## 2011-04-14 DIAGNOSIS — C3490 Malignant neoplasm of unspecified part of unspecified bronchus or lung: Secondary | ICD-10-CM

## 2011-04-14 HISTORY — DX: Malignant neoplasm of unspecified part of unspecified bronchus or lung: C34.90

## 2011-05-12 DIAGNOSIS — J449 Chronic obstructive pulmonary disease, unspecified: Secondary | ICD-10-CM | POA: Diagnosis not present

## 2011-05-12 DIAGNOSIS — J988 Other specified respiratory disorders: Secondary | ICD-10-CM | POA: Diagnosis not present

## 2011-05-12 DIAGNOSIS — R609 Edema, unspecified: Secondary | ICD-10-CM | POA: Diagnosis not present

## 2011-05-12 DIAGNOSIS — R42 Dizziness and giddiness: Secondary | ICD-10-CM | POA: Diagnosis not present

## 2011-05-13 DIAGNOSIS — R609 Edema, unspecified: Secondary | ICD-10-CM | POA: Diagnosis not present

## 2011-05-13 DIAGNOSIS — R0989 Other specified symptoms and signs involving the circulatory and respiratory systems: Secondary | ICD-10-CM | POA: Diagnosis not present

## 2011-05-13 DIAGNOSIS — Z79899 Other long term (current) drug therapy: Secondary | ICD-10-CM | POA: Diagnosis not present

## 2011-05-13 DIAGNOSIS — R0609 Other forms of dyspnea: Secondary | ICD-10-CM | POA: Diagnosis not present

## 2011-05-19 DIAGNOSIS — J449 Chronic obstructive pulmonary disease, unspecified: Secondary | ICD-10-CM | POA: Diagnosis not present

## 2011-05-27 DIAGNOSIS — L84 Corns and callosities: Secondary | ICD-10-CM | POA: Diagnosis not present

## 2011-05-27 DIAGNOSIS — D235 Other benign neoplasm of skin of trunk: Secondary | ICD-10-CM | POA: Diagnosis not present

## 2011-05-27 DIAGNOSIS — L570 Actinic keratosis: Secondary | ICD-10-CM | POA: Diagnosis not present

## 2011-06-17 DIAGNOSIS — J449 Chronic obstructive pulmonary disease, unspecified: Secondary | ICD-10-CM | POA: Diagnosis not present

## 2011-08-05 ENCOUNTER — Other Ambulatory Visit: Payer: Self-pay | Admitting: Family Medicine

## 2011-08-05 DIAGNOSIS — Z79899 Other long term (current) drug therapy: Secondary | ICD-10-CM | POA: Diagnosis not present

## 2011-08-05 DIAGNOSIS — R042 Hemoptysis: Secondary | ICD-10-CM | POA: Diagnosis not present

## 2011-08-06 ENCOUNTER — Ambulatory Visit (HOSPITAL_COMMUNITY)
Admission: RE | Admit: 2011-08-06 | Discharge: 2011-08-06 | Disposition: A | Discharge status: Home / Self Care | Payer: Medicare Other | Source: Ambulatory Visit | Attending: Family Medicine | Admitting: Family Medicine

## 2011-08-06 ENCOUNTER — Encounter (HOSPITAL_COMMUNITY): Payer: Self-pay

## 2011-08-06 DIAGNOSIS — R0609 Other forms of dyspnea: Secondary | ICD-10-CM | POA: Diagnosis not present

## 2011-08-06 DIAGNOSIS — R042 Hemoptysis: Secondary | ICD-10-CM | POA: Insufficient documentation

## 2011-08-06 DIAGNOSIS — R222 Localized swelling, mass and lump, trunk: Secondary | ICD-10-CM | POA: Insufficient documentation

## 2011-08-06 DIAGNOSIS — R0989 Other specified symptoms and signs involving the circulatory and respiratory systems: Secondary | ICD-10-CM | POA: Insufficient documentation

## 2011-08-06 DIAGNOSIS — J984 Other disorders of lung: Secondary | ICD-10-CM | POA: Diagnosis not present

## 2011-08-07 DIAGNOSIS — D381 Neoplasm of uncertain behavior of trachea, bronchus and lung: Secondary | ICD-10-CM | POA: Diagnosis not present

## 2011-08-12 DIAGNOSIS — G939 Disorder of brain, unspecified: Secondary | ICD-10-CM | POA: Diagnosis not present

## 2011-08-12 DIAGNOSIS — R222 Localized swelling, mass and lump, trunk: Secondary | ICD-10-CM | POA: Diagnosis not present

## 2011-08-12 DIAGNOSIS — E785 Hyperlipidemia, unspecified: Secondary | ICD-10-CM | POA: Diagnosis not present

## 2011-08-12 DIAGNOSIS — Z951 Presence of aortocoronary bypass graft: Secondary | ICD-10-CM | POA: Diagnosis not present

## 2011-08-12 DIAGNOSIS — Z87891 Personal history of nicotine dependence: Secondary | ICD-10-CM | POA: Diagnosis not present

## 2011-08-12 DIAGNOSIS — I10 Essential (primary) hypertension: Secondary | ICD-10-CM | POA: Diagnosis not present

## 2011-08-12 DIAGNOSIS — C3490 Malignant neoplasm of unspecified part of unspecified bronchus or lung: Secondary | ICD-10-CM | POA: Diagnosis not present

## 2011-08-12 DIAGNOSIS — I2510 Atherosclerotic heart disease of native coronary artery without angina pectoris: Secondary | ICD-10-CM | POA: Diagnosis not present

## 2011-08-12 DIAGNOSIS — R911 Solitary pulmonary nodule: Secondary | ICD-10-CM | POA: Diagnosis not present

## 2011-08-14 ENCOUNTER — Ambulatory Visit: Payer: Medicare Other | Admitting: Cardiology

## 2011-08-17 DIAGNOSIS — R918 Other nonspecific abnormal finding of lung field: Secondary | ICD-10-CM | POA: Diagnosis not present

## 2011-08-17 DIAGNOSIS — C3490 Malignant neoplasm of unspecified part of unspecified bronchus or lung: Secondary | ICD-10-CM | POA: Diagnosis not present

## 2011-08-17 DIAGNOSIS — Z87891 Personal history of nicotine dependence: Secondary | ICD-10-CM | POA: Diagnosis not present

## 2011-08-17 DIAGNOSIS — I10 Essential (primary) hypertension: Secondary | ICD-10-CM | POA: Diagnosis not present

## 2011-08-17 DIAGNOSIS — E785 Hyperlipidemia, unspecified: Secondary | ICD-10-CM | POA: Diagnosis not present

## 2011-08-17 DIAGNOSIS — I2510 Atherosclerotic heart disease of native coronary artery without angina pectoris: Secondary | ICD-10-CM | POA: Diagnosis not present

## 2011-08-17 DIAGNOSIS — Z951 Presence of aortocoronary bypass graft: Secondary | ICD-10-CM | POA: Diagnosis not present

## 2011-08-24 DIAGNOSIS — J449 Chronic obstructive pulmonary disease, unspecified: Secondary | ICD-10-CM | POA: Diagnosis not present

## 2011-08-24 DIAGNOSIS — J209 Acute bronchitis, unspecified: Secondary | ICD-10-CM | POA: Diagnosis not present

## 2011-08-25 ENCOUNTER — Inpatient Hospital Stay (HOSPITAL_COMMUNITY)
Admission: EM | Admit: 2011-08-25 | Discharge: 2011-08-28 | DRG: 190 | Disposition: A | Discharge status: Home / Self Care | Payer: Medicare Other | Attending: Internal Medicine | Admitting: Internal Medicine

## 2011-08-25 ENCOUNTER — Other Ambulatory Visit: Payer: Self-pay | Admitting: Family Medicine

## 2011-08-25 ENCOUNTER — Encounter (HOSPITAL_COMMUNITY): Payer: Self-pay | Admitting: *Deleted

## 2011-08-25 ENCOUNTER — Ambulatory Visit (HOSPITAL_COMMUNITY)
Admission: RE | Admit: 2011-08-25 | Discharge: 2011-08-25 | Disposition: A | Discharge status: Home / Self Care | Payer: Medicare Other | Source: Ambulatory Visit | Attending: Family Medicine | Admitting: Family Medicine

## 2011-08-25 DIAGNOSIS — J189 Pneumonia, unspecified organism: Secondary | ICD-10-CM | POA: Diagnosis present

## 2011-08-25 DIAGNOSIS — E785 Hyperlipidemia, unspecified: Secondary | ICD-10-CM | POA: Diagnosis present

## 2011-08-25 DIAGNOSIS — Z7982 Long term (current) use of aspirin: Secondary | ICD-10-CM

## 2011-08-25 DIAGNOSIS — J449 Chronic obstructive pulmonary disease, unspecified: Secondary | ICD-10-CM | POA: Diagnosis not present

## 2011-08-25 DIAGNOSIS — D638 Anemia in other chronic diseases classified elsewhere: Secondary | ICD-10-CM | POA: Diagnosis present

## 2011-08-25 DIAGNOSIS — G252 Other specified forms of tremor: Secondary | ICD-10-CM | POA: Diagnosis present

## 2011-08-25 DIAGNOSIS — I129 Hypertensive chronic kidney disease with stage 1 through stage 4 chronic kidney disease, or unspecified chronic kidney disease: Secondary | ICD-10-CM | POA: Diagnosis not present

## 2011-08-25 DIAGNOSIS — E139 Other specified diabetes mellitus without complications: Secondary | ICD-10-CM | POA: Diagnosis present

## 2011-08-25 DIAGNOSIS — R55 Syncope and collapse: Secondary | ICD-10-CM

## 2011-08-25 DIAGNOSIS — R112 Nausea with vomiting, unspecified: Secondary | ICD-10-CM | POA: Diagnosis not present

## 2011-08-25 DIAGNOSIS — Z951 Presence of aortocoronary bypass graft: Secondary | ICD-10-CM

## 2011-08-25 DIAGNOSIS — R0600 Dyspnea, unspecified: Secondary | ICD-10-CM

## 2011-08-25 DIAGNOSIS — J438 Other emphysema: Secondary | ICD-10-CM

## 2011-08-25 DIAGNOSIS — R7989 Other specified abnormal findings of blood chemistry: Secondary | ICD-10-CM | POA: Diagnosis not present

## 2011-08-25 DIAGNOSIS — R079 Chest pain, unspecified: Secondary | ICD-10-CM | POA: Diagnosis not present

## 2011-08-25 DIAGNOSIS — I2510 Atherosclerotic heart disease of native coronary artery without angina pectoris: Secondary | ICD-10-CM | POA: Diagnosis present

## 2011-08-25 DIAGNOSIS — J441 Chronic obstructive pulmonary disease with (acute) exacerbation: Secondary | ICD-10-CM | POA: Diagnosis present

## 2011-08-25 DIAGNOSIS — R7303 Prediabetes: Secondary | ICD-10-CM | POA: Diagnosis present

## 2011-08-25 DIAGNOSIS — R0989 Other specified symptoms and signs involving the circulatory and respiratory systems: Secondary | ICD-10-CM | POA: Diagnosis not present

## 2011-08-25 DIAGNOSIS — D126 Benign neoplasm of colon, unspecified: Secondary | ICD-10-CM

## 2011-08-25 DIAGNOSIS — D696 Thrombocytopenia, unspecified: Secondary | ICD-10-CM

## 2011-08-25 DIAGNOSIS — R5381 Other malaise: Secondary | ICD-10-CM | POA: Diagnosis not present

## 2011-08-25 DIAGNOSIS — Z87891 Personal history of nicotine dependence: Secondary | ICD-10-CM | POA: Diagnosis not present

## 2011-08-25 DIAGNOSIS — C3490 Malignant neoplasm of unspecified part of unspecified bronchus or lung: Secondary | ICD-10-CM | POA: Diagnosis not present

## 2011-08-25 DIAGNOSIS — IMO0002 Reserved for concepts with insufficient information to code with codable children: Secondary | ICD-10-CM | POA: Diagnosis not present

## 2011-08-25 DIAGNOSIS — D649 Anemia, unspecified: Secondary | ICD-10-CM | POA: Diagnosis present

## 2011-08-25 DIAGNOSIS — J209 Acute bronchitis, unspecified: Secondary | ICD-10-CM

## 2011-08-25 DIAGNOSIS — R918 Other nonspecific abnormal finding of lung field: Secondary | ICD-10-CM | POA: Diagnosis not present

## 2011-08-25 DIAGNOSIS — Z85118 Personal history of other malignant neoplasm of bronchus and lung: Secondary | ICD-10-CM

## 2011-08-25 DIAGNOSIS — F17201 Nicotine dependence, unspecified, in remission: Secondary | ICD-10-CM

## 2011-08-25 DIAGNOSIS — M1710 Unilateral primary osteoarthritis, unspecified knee: Secondary | ICD-10-CM | POA: Diagnosis not present

## 2011-08-25 DIAGNOSIS — I679 Cerebrovascular disease, unspecified: Secondary | ICD-10-CM

## 2011-08-25 DIAGNOSIS — T380X5A Adverse effect of glucocorticoids and synthetic analogues, initial encounter: Secondary | ICD-10-CM | POA: Diagnosis present

## 2011-08-25 DIAGNOSIS — N183 Chronic kidney disease, stage 3 unspecified: Secondary | ICD-10-CM | POA: Diagnosis present

## 2011-08-25 DIAGNOSIS — J9801 Acute bronchospasm: Secondary | ICD-10-CM

## 2011-08-25 DIAGNOSIS — J4489 Other specified chronic obstructive pulmonary disease: Secondary | ICD-10-CM | POA: Diagnosis not present

## 2011-08-25 DIAGNOSIS — I10 Essential (primary) hypertension: Secondary | ICD-10-CM

## 2011-08-25 DIAGNOSIS — G250 Essential tremor: Secondary | ICD-10-CM | POA: Diagnosis not present

## 2011-08-25 DIAGNOSIS — R0609 Other forms of dyspnea: Secondary | ICD-10-CM | POA: Diagnosis not present

## 2011-08-25 HISTORY — DX: Malignant neoplasm of unspecified part of unspecified bronchus or lung: C34.90

## 2011-08-25 HISTORY — DX: Prediabetes: R73.03

## 2011-08-25 MED ORDER — HEPARIN SODIUM (PORCINE) 5000 UNIT/ML IJ SOLN
5000.0000 [IU] | Freq: Three times a day (TID) | INTRAMUSCULAR | Status: DC
  Administered 2011-08-25 – 2011-08-28 (×9): 5000 [IU] via SUBCUTANEOUS
  Filled 2011-08-25 (×8): qty 1

## 2011-08-25 MED ORDER — SODIUM CHLORIDE 0.9 % IV SOLN
INTRAVENOUS | Status: AC
  Administered 2011-08-25: 22:00:00 via INTRAVENOUS

## 2011-08-25 MED ORDER — DEXTROSE 5 % IV SOLN
500.0000 mg | INTRAVENOUS | Status: DC
  Administered 2011-08-26 – 2011-08-27 (×2): 500 mg via INTRAVENOUS
  Filled 2011-08-25 (×5): qty 500

## 2011-08-25 MED ORDER — DOCUSATE SODIUM 100 MG PO CAPS
100.0000 mg | ORAL_CAPSULE | Freq: Two times a day (BID) | ORAL | Status: DC
  Administered 2011-08-25 – 2011-08-28 (×6): 100 mg via ORAL
  Filled 2011-08-25 (×7): qty 1

## 2011-08-25 MED ORDER — DEXTROSE 5 % IV SOLN: 1.0000 g | INTRAVENOUS | Status: DC

## 2011-08-25 MED ORDER — LEVALBUTEROL HCL 0.63 MG/3ML IN NEBU
0.6300 mg | INHALATION_SOLUTION | Freq: Four times a day (QID) | RESPIRATORY_TRACT | Status: DC
  Administered 2011-08-25 – 2011-08-28 (×8): 0.63 mg via RESPIRATORY_TRACT
  Filled 2011-08-25 (×8): qty 3

## 2011-08-25 MED ORDER — DEXTROSE 5 % IV SOLN
INTRAVENOUS | Status: AC
  Filled 2011-08-25: qty 500

## 2011-08-25 MED ORDER — SODIUM CHLORIDE 0.9 % IJ SOLN
INTRAMUSCULAR | Status: AC
  Filled 2011-08-25: qty 3

## 2011-08-25 MED ORDER — DEXTROSE 5 % IV SOLN
INTRAVENOUS | Status: AC
  Filled 2011-08-25: qty 10

## 2011-08-25 MED ORDER — METHYLPREDNISOLONE SODIUM SUCC 125 MG IJ SOLR
60.0000 mg | Freq: Three times a day (TID) | INTRAMUSCULAR | Status: DC
  Administered 2011-08-25 (×2): 60 mg via INTRAVENOUS
  Administered 2011-08-26: 13:00:00 via INTRAVENOUS
  Administered 2011-08-26: 60 mg via INTRAVENOUS
  Filled 2011-08-25 (×4): qty 2

## 2011-08-25 MED ORDER — METOPROLOL TARTRATE 50 MG PO TABS
50.0000 mg | ORAL_TABLET | Freq: Every day | ORAL | Status: DC
  Administered 2011-08-26 – 2011-08-28 (×3): 50 mg via ORAL
  Filled 2011-08-25 (×3): qty 1

## 2011-08-25 MED ORDER — DEXTROSE 5 % IV SOLN
1.0000 g | INTRAVENOUS | Status: DC
  Administered 2011-08-26 – 2011-08-27 (×2): 1 g via INTRAVENOUS
  Filled 2011-08-25 (×5): qty 10

## 2011-08-25 MED ORDER — LEVALBUTEROL HCL 0.63 MG/3ML IN NEBU: 0.6300 mg | INHALATION_SOLUTION | RESPIRATORY_TRACT | Status: DC | PRN

## 2011-08-25 MED ORDER — ACETAMINOPHEN 325 MG PO TABS: 650.0000 mg | ORAL_TABLET | Freq: Four times a day (QID) | ORAL | Status: DC | PRN

## 2011-08-25 MED ORDER — CEFTRIAXONE SODIUM 1 G IJ SOLR
1.0000 g | Freq: Once | INTRAMUSCULAR | Status: AC
  Administered 2011-08-25: 1 g via INTRAVENOUS

## 2011-08-25 MED ORDER — ATORVASTATIN CALCIUM 40 MG PO TABS
40.0000 mg | ORAL_TABLET | Freq: Every day | ORAL | Status: DC
  Administered 2011-08-26 – 2011-08-28 (×3): 40 mg via ORAL
  Filled 2011-08-25 (×3): qty 1

## 2011-08-25 MED ORDER — TIOTROPIUM BROMIDE MONOHYDRATE 18 MCG IN CAPS
18.0000 ug | ORAL_CAPSULE | Freq: Every day | RESPIRATORY_TRACT | Status: DC
  Administered 2011-08-26 – 2011-08-28 (×3): 18 ug via RESPIRATORY_TRACT
  Filled 2011-08-25: qty 5

## 2011-08-25 MED ORDER — ONDANSETRON HCL 4 MG/2ML IJ SOLN: 4.0000 mg | Freq: Four times a day (QID) | INTRAMUSCULAR | Status: DC | PRN

## 2011-08-25 MED ORDER — ASPIRIN EC 81 MG PO TBEC
81.0000 mg | DELAYED_RELEASE_TABLET | Freq: Every day | ORAL | Status: DC
  Administered 2011-08-26 – 2011-08-28 (×3): 81 mg via ORAL
  Filled 2011-08-25 (×3): qty 1

## 2011-08-25 MED ORDER — ONDANSETRON HCL 4 MG PO TABS: 4.0000 mg | ORAL_TABLET | Freq: Four times a day (QID) | ORAL | Status: DC | PRN

## 2011-08-25 MED ORDER — DEXTROSE 5 % IV SOLN: 500.0000 mg | INTRAVENOUS | Status: DC

## 2011-08-25 MED ORDER — FUROSEMIDE 20 MG PO TABS: 20.0000 mg | ORAL_TABLET | Freq: Three times a day (TID) | ORAL | Status: DC

## 2011-08-25 MED ORDER — TERAZOSIN HCL 5 MG PO CAPS
5.0000 mg | ORAL_CAPSULE | Freq: Every day | ORAL | Status: DC
  Administered 2011-08-25 – 2011-08-27 (×3): 5 mg via ORAL
  Filled 2011-08-25 (×3): qty 1

## 2011-08-25 MED ORDER — DEXTROSE 5 % IV SOLN
500.0000 mg | Freq: Once | INTRAVENOUS | Status: AC
  Administered 2011-08-25: 22:00:00 via INTRAVENOUS

## 2011-08-25 MED ORDER — ACETAMINOPHEN 500 MG PO TABS
ORAL_TABLET | ORAL | Status: AC
  Administered 2011-08-25: 1000 mg
  Filled 2011-08-25: qty 2

## 2011-08-25 MED ORDER — ACETAMINOPHEN 650 MG RE SUPP: 650.0000 mg | Freq: Four times a day (QID) | RECTAL | Status: DC | PRN

## 2011-08-25 MED ORDER — BUDESONIDE-FORMOTEROL FUMARATE 160-4.5 MCG/ACT IN AERO
INHALATION_SPRAY | RESPIRATORY_TRACT | Status: AC
  Filled 2011-08-25: qty 6

## 2011-08-25 MED ORDER — OXYCODONE HCL 5 MG PO TABS
5.0000 mg | ORAL_TABLET | ORAL | Status: DC | PRN
  Administered 2011-08-27: 5 mg via ORAL
  Filled 2011-08-25: qty 1

## 2011-08-25 MED ORDER — MORPHINE SULFATE 2 MG/ML IJ SOLN: 2.0000 mg | INTRAMUSCULAR | Status: DC | PRN

## 2011-08-25 MED ORDER — BUDESONIDE-FORMOTEROL FUMARATE 160-4.5 MCG/ACT IN AERO
2.0000 | INHALATION_SPRAY | Freq: Two times a day (BID) | RESPIRATORY_TRACT | Status: DC
  Administered 2011-08-25 – 2011-08-28 (×6): 2 via RESPIRATORY_TRACT
  Filled 2011-08-25: qty 6

## 2011-08-25 MED ORDER — DILTIAZEM HCL ER COATED BEADS 180 MG PO CP24
180.0000 mg | ORAL_CAPSULE | Freq: Every day | ORAL | Status: DC
  Administered 2011-08-26 – 2011-08-28 (×3): 180 mg via ORAL
  Filled 2011-08-25 (×3): qty 1

## 2011-08-25 NOTE — ED Notes (Signed)
Pt was treated for ? Pneumonia by dr Gerda Diss. Pt states he was given prednisone in the office today and started on antibiotics yesterday.

## 2011-08-25 NOTE — ED Notes (Signed)
Cough, had xray and blood work here , ordered by Dr Gerda Diss

## 2011-08-25 NOTE — H&P (Signed)
[name] is an 78 y.o. male.    PCP: Lilyan Punt, MD, MD    Chief Complaint: Shortness of breath  HPI: This is a 78 year old, Caucasian male, with a past medical history of COPD, coronary artery disease, hypertension, who was recently diagnosed with lung cancer, just a few weeks ago, at [facility]. He is supposed to follow up with him tomorrow to discuss treatment options. He was in his usual state of health till last night when he started having difficulty breathing. He had a cough with yellowish expectoration. He denied any chest pain, but whenever he would cough he would feel some discomfort in the chest. Denies any sharp pain or pleuritic pain. Denies any nausea, vomiting. Had fever up to 101.7 Fahrenheit. Had some chills. He's lost about 3-4 pounds over the last few weeks. Denies any sick contacts. He went to his doctor's office and  was prescribed oral antibiotics. He was given prednisone. When his symptoms did not improve. He was asked to go for blood work, and chest x-ray today, which showed the pneumonia, and he was subsequently asked to go to the emergency department.   Home Medications: Prior to Admission medications   Medication Sig Start Date End Date Taking? Authorizing Provider  aspirin EC 81 MG tablet Take 81 mg by mouth daily.     Yes Historical Provider, MD  atorvastatin (LIPITOR) 40 MG tablet Take 1 tablet (40 mg total) by mouth daily. 12/08/10  Yes Christiane Ha, MD  budesonide-formoterol [facility]) 160-4.5 MCG/ACT inhaler Inhale 2 puffs into the lungs 2 (two) times daily.     Yes Historical Provider, MD  diltiazem (CARDIZEM CD) 180 MG 24 hr capsule Take 180 mg by mouth daily.     Yes Historical Provider, MD  fluticasone (FLONASE) 50 MCG/ACT nasal spray Place 2 sprays into the nose daily.     Yes Historical Provider, MD  furosemide (LASIX) 20 MG tablet Take 20-40 mg by mouth 3 (three) times daily. Patient takes 40mg  in the morning and 20mg  at noon    Yes Historical  Provider, MD  metoprolol (LOPRESSOR) 50 MG tablet Take 50 mg by mouth daily.    Yes Historical Provider, MD  predniSONE (STERAPRED UNI-PAK) 10 MG tablet Take 20 mg by mouth 2 (two) times daily. Prednisone 20mg  twice daily for four days then once daily for four days 08/25/11 08/29/11 Yes Historical Provider, MD  terazosin (HYTRIN) 5 MG capsule Take 5 mg by mouth at bedtime.     Yes Historical Provider, MD  tetrahydrozoline-zinc (VISINE-AC) 0.05-0.25 % ophthalmic solution Place 1 drop into both eyes daily.     Yes Historical Provider, MD  tiotropium (SPIRIVA) 18 MCG inhalation capsule Place 18 mcg into inhaler and inhale daily.   Yes Historical Provider, MD  VENTOLIN HFA 108 (90 BASE) MCG/ACT inhaler Inhale 2 puffs into the lungs 4 (four) times daily.  08/04/10  Yes Historical Provider, MD  nitroGLYCERIN (NITROSTAT) 0.4 MG SL tablet Place 1 tablet (0.4 mg total) under the tongue every 5 (five) minutes as needed for chest pain. 08/18/10   Kathlen Brunswick, MD    Allergies:  Allergies  Allergen Reactions  . Beta Adrenergic Blockers Hives and Swelling  . Levaquin (Levofloxacin In D5w)     nausea  . Penicillins Hives and Swelling  . Sulfonamide Derivatives Hives and Swelling    Past Medical History: Past Medical History  Diagnosis Date  . ASCVD (arteriosclerotic cardiovascular disease)      CABG in 04/1993;  and negative stress nuclear study in 08/2001  . Hyperlipidemia   . Syncope   . Hypertension   . Tobacco abuse, in remission     40 pack years; discontinued in 1980  . Cerebrovascular disease     Right carotid bruit-40-69% left internal carotid artery stenosis in 4/06; followed [facility]  . Chronic kidney disease, stage II (mild)     Creatinine-1.6 in 09/2008  . Degenerative joint disease of knee, left   . Normocytic anemia   . Pulmonary disease   . Cancer     skin  . Lung cancer 2013    Past Surgical History  Procedure Date  . Coronary artery bypass graft 1995  . Colonoscopy w/  polypectomy 1985  . Pilonidal cyst excision 1948  . Lesion excision   . Cardiac surgery     Social History:  reports that he quit smoking about 38 years ago. His smoking use included Cigarettes. He has a 40 pack-year smoking history. He quit smokeless tobacco use about 30 years ago. His smokeless tobacco use included Chew. He reports that he does not drink alcohol or use illicit drugs.  Family History:  Family History  Problem Relation Age of Onset  . Cancer Brother   . Heart attack Brother     Review of Systems - History obtained from the patient General ROS: positive for  - chills, fatigue and weight loss Psychological ROS: negative Ophthalmic ROS: negative ENT ROS: negative Allergy and Immunology ROS: negative Hematological and Lymphatic ROS: negative Endocrine ROS: negative Respiratory ROS: as in hpi Cardiovascular ROS: as in hpi Gastrointestinal ROS: negative Genito-Urinary ROS: negative Musculoskeletal ROS: negative Neurological ROS: negative Dermatological ROS: negative  Physical Examination Blood pressure 108/49, pulse 77, temperature 98.2 F (36.8 C), temperature source Oral, resp. rate 18, height 5\' 9"  (1.753 m), weight 81.2 kg (179 lb 0.2 oz), SpO2 93.00%.  General appearance: alert, cooperative, appears stated age and no distress Head: Normocephalic, without obvious abnormality, atraumatic Eyes: conjunctivae/corneas clear. PERRL, EOM's intact.  Throat: lips, mucosa, and tongue normal; teeth and gums normal Neck: no adenopathy, no carotid bruit, no JVD, supple, symmetrical, trachea midline and thyroid not enlarged, symmetric, no tenderness/mass/nodules Back: symmetric, no curvature. ROM normal. No CVA tenderness. Resp: Decreased air entry at the bases. A few end expiratory wheezing appreciated. No definite crackles. Cardio: S1-S2 is normal. Regular. Systolic murmur appreciated over the precordium. No S3, S4. No rubs, murmurs, or bruits. GI: soft, non-tender;  bowel sounds normal; no masses,  no organomegaly Extremities: extremities normal, atraumatic, no cyanosis or edema Pulses: 2+ and symmetric Skin: Skin color, texture, turgor normal. No rashes or lesions Lymph nodes: Cervical, supraclavicular, and axillary nodes normal. Neurologic: Alert and oriented X 3, normal strength and tone. Normal symmetric reflexes. Normal coordination and gait  Laboratory Data: He had blood work done a Dr [employer] office. White cell blood count was 23.6 with 85% neutrophils. Hemoglobin was 10.1. Platelet count 291. Electrolytes were okay. His creatinine was 1.66. BNP was 2722.  Radiology Reports: Dg Chest 2 View  08/25/2011  *RADIOLOGY REPORT*  Clinical Data: COPD, lung carcinoma  CHEST - 2 VIEW  Comparison: Chest CT of 08/06/2011 and chest x-ray of 12/08/2010  Findings: As demonstrated on recent CT, the opacity in the left upper lobe medially is again noted consistent with neoplasm and possibly postobstructive pneumonitis.  The remainder of the lungs are clear and hyperaerated consistent with a degree of COPD. 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  Cardiomegaly is  stable.  There are diffuse degenerative changes throughout the thoracic spine.  IMPRESSION: Left upper lobe opacity worrisome for malignancy and possibly postobstructive pneumonitis.  COPD.  Original Report Authenticated By: Juline Patch, M.D.     Assessment/Plan  Principal Problem:  *Community acquired pneumonia Active Problems:  HYPERLIPIDEMIA  ANEMIA  TREMOR, ESSENTIAL  HYPERTENSION  ATHEROSCLEROTIC CARDIOVASCULAR DISEASE  CKD (chronic kidney disease), stage III  Lung cancer  COPD (chronic obstructive pulmonary disease)   #1 community-acquired pneumonia/postobstructive pneumonia: He will be treated with ceftriaxone and azithromycin. He'll be given nebulizer treatments, and I think he may benefit from steroids. Oxygen will be provided. His chest discomfort. Is probably from his  pneumonia.  #2 recently diagnosed lung cancer: Pathology is not known at this time. He will followup at [facility], probably next week for further treatment options.  #3 history of COPD: Please see above.  #4 history of hypertension. Continue with antihypertensive agents.  #5 history of chronic kidney disease. Monitor creatinine closely.  #6 history of anemia, stable.  #7 history of essential tremors stable.  DVT, prophylaxis will be initiated. He is a full code.  Further management decisions will depend on results of further testing and patient's response to treatment  [facility]  [facility] Pager [phone]  08/25/2011, 8:54 PM

## 2011-08-25 NOTE — ED Provider Notes (Signed)
History  Scribed for Geoffery Lyons, MD, the patient was seen in room APA14/APA14. This chart was scribed by Candelaria Stagers. The patient's care started at 5:45 PM    CSN: [id number]  Arrival date & time 08/25/11  1644   First MD Initiated Contact with Patient 08/25/11 1743      Chief Complaint  Patient presents with  . Shortness of Breath    Patient is a 78 y.o. male presenting with shortness of breath. The history is provided by the patient.  Shortness of Breath  Associated symptoms include a fever, cough and shortness of breath.   [name] is a 78 y.o. male who presents to the Emergency Department complaining of SOB that started today.  Pt has h/o of COPD and lung cancer.  He had a biopsy one week ago and since then has had cough and fever.  He was seen by his PCP Dr. Gerda Diss this morning and was given antibiotics for lung infections.  His SOB has since gotten worse with Luk[place] suggesting admittance to the hospital.     Past Medical History  Diagnosis Date  . ASCVD (arteriosclerotic cardiovascular disease)      CABG in 04/1993; and negative stress nuclear study in 08/2001  . Hyperlipidemia   . Syncope   . Hypertension   . Tobacco abuse, in remission     40 pack years; discontinued in 1980  . Cerebrovascular disease     Right carotid bruit-40-69% left internal carotid artery stenosis in 4/06; followed [facility]  . Chronic kidney disease, stage II (mild)     Creatinine-1.6 in 09/2008  . Degenerative joint disease of knee, left   . Normocytic anemia   . Pulmonary disease   . Cancer     skin    Past Surgical History  Procedure Date  . Coronary artery bypass graft 1995  . Colonoscopy w/ polypectomy 1985  . Pilonidal cyst excision 1948  . Lesion excision   . Cardiac surgery     Family History  Problem Relation Age of Onset  . Cancer Brother   . Heart attack Brother     History  Substance Use Topics  . Smoking status: Former Smoker -- 1.0 packs/day for 40 years   Types: Cigarettes    Quit date: 08/14/1973  . Smokeless tobacco: Former Neurosurgeon    Types: Chew    Quit date: 12/05/1980  . Alcohol Use: No      Review of Systems  Constitutional: Positive for fever.  Respiratory: Positive for cough and shortness of breath.   All other systems reviewed and are negative.    Allergies  Beta adrenergic blockers; Penicillins; and Sulfonamide derivatives  Home Medications   Current Outpatient Rx  Name Route Sig Dispense Refill  . ASPIRIN EC 81 MG PO TBEC Oral Take 81 mg by mouth daily.      . ATORVASTATIN CALCIUM 40 MG PO TABS Oral Take 1 tablet (40 mg total) by mouth daily.    . BUDESONIDE-FORMOTEROL FUMARATE 160-4.5 MCG/ACT IN AERO Inhalation Inhale 2 puffs into the lungs 2 (two) times daily.      Marland Kitchen DILTIAZEM HCL ER COATED BEADS 180 MG PO CP24 Oral Take 180 mg by mouth daily.      Marland Kitchen FLUTICASONE PROPIONATE 50 MCG/ACT NA SUSP Nasal Place 2 sprays into the nose daily.      . FUROSEMIDE 20 MG PO TABS Oral Take 20-40 mg by mouth 3 (three) times daily. Patient takes 40mg  in the morning and  20mg  at noon     . METOPROLOL TARTRATE 50 MG PO TABS Oral Take 50 mg by mouth daily.     . MULTI-VITAMIN/MINERALS PO TABS Oral Take 1 tablet by mouth daily.      Marland Kitchen NITROGLYCERIN 0.4 MG SL SUBL Sublingual Place 1 tablet (0.4 mg total) under the tongue every 5 (five) minutes as needed for chest pain. 25 tablet 2  . TERAZOSIN HCL 5 MG PO CAPS Oral Take 5 mg by mouth at bedtime.      Jeananne Rama SULFATE 0.05-0.25 % OP SOLN Both Eyes Place 1 drop into both eyes daily.      . VENTOLIN HFA 108 (90 BASE) MCG/ACT IN AERS Inhalation Inhale 2 puffs into the lungs 4 (four) times daily.       BP 121/47  Pulse 107  Temp(Src) 101.3 F (38.5 C) (Oral)  Resp 20  Ht 5\' 7"  (1.702 m)  Wt 181 lb (82.101 kg)  BMI 28.35 kg/m2  SpO2 95%  Physical Exam  Nursing note and vitals reviewed. Constitutional: He is oriented to person, place, and time. He appears well-developed and  well-nourished. No distress.  HENT:  Head: Normocephalic and atraumatic.  Eyes: Conjunctivae and EOM are normal. Pupils are equal, round, and reactive to light.  Neck: Normal range of motion. Neck supple.  Cardiovascular: Normal rate and regular rhythm.   Pulmonary/Chest: Effort normal. He has no wheezes. He has no rales.  Musculoskeletal: He exhibits edema (2+ pitting edema of legs bilaterally).  Neurological: He is alert and oriented to person, place, and time.  Skin: Skin is warm and dry. He is not diaphoretic.  Psychiatric: He has a normal mood and affect. His behavior is normal.    ED Course  Procedures  DIAGNOSTIC STUDIES: Oxygen Saturation is 95% on room air, normal by my interpretation.    COORDINATION OF CARE:     Labs Reviewed - No data to display Dg Chest 2 View  08/25/2011  *RADIOLOGY REPORT*  Clinical Data: COPD, lung carcinoma  CHEST - 2 VIEW  Comparison: Chest CT of 08/06/2011 and chest x-ray of 12/08/2010  Findings: As demonstrated on recent CT, the opacity in the left upper lobe medially is again noted consistent with neoplasm and possibly postobstructive pneumonitis.  The remainder of the lungs are clear and hyperaerated consistent with a degree of COPD. 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  Cardiomegaly is stable.  There are diffuse degenerative changes throughout the thoracic spine.  IMPRESSION: Left upper lobe opacity worrisome for malignancy and possibly postobstructive pneumonitis.  COPD.  Original Report Authenticated By: Juline Patch, M.D.     No diagnosis found.     MDM  The patient was sent here from pcp for admission, treatment of pneumonia.  He has an elevated wbc of 23k, fevers at home and at the office.  I have consulted [facility] who agrees to admit the patient.  I will give rocephin and zithromax after blood cultures are drawn.  I personally performed the services described in this documentation, which was scribed in my presence. The  recorded information has been reviewed and considered.          Geoffery Lyons, MD 08/25/11 [phone]

## 2011-08-26 DIAGNOSIS — J438 Other emphysema: Secondary | ICD-10-CM

## 2011-08-26 DIAGNOSIS — D696 Thrombocytopenia, unspecified: Secondary | ICD-10-CM

## 2011-08-26 DIAGNOSIS — C3490 Malignant neoplasm of unspecified part of unspecified bronchus or lung: Secondary | ICD-10-CM

## 2011-08-26 DIAGNOSIS — I10 Essential (primary) hypertension: Secondary | ICD-10-CM

## 2011-08-26 LAB — COMPREHENSIVE METABOLIC PANEL
AST: 40 U/L — ABNORMAL HIGH (ref 0–37)
CO2: 25 mEq/L (ref 19–32)
Calcium: 9.5 mg/dL (ref 8.4–10.5)
Creatinine, Ser: 1.5 mg/dL — ABNORMAL HIGH (ref 0.50–1.35)
GFR calc Af Amer: 46 mL/min — ABNORMAL LOW (ref >90)
GFR calc non Af Amer: 40 mL/min — ABNORMAL LOW (ref >90)
Glucose, Bld: 157 mg/dL — ABNORMAL HIGH (ref 70–99)
Total Protein: 6.4 g/dL (ref 6.0–8.3)

## 2011-08-26 LAB — CBC
HCT: 28.4 % — ABNORMAL LOW (ref 39.0–52.0)
Hemoglobin: 9.6 g/dL — ABNORMAL LOW (ref 13.0–17.0)
RBC: 3.14 MIL/uL — ABNORMAL LOW (ref 4.22–5.81)

## 2011-08-26 LAB — STREP PNEUMONIAE URINARY ANTIGEN: Strep Pneumo Urinary Antigen: NEGATIVE

## 2011-08-26 MED ORDER — FUROSEMIDE 20 MG PO TABS
20.0000 mg | ORAL_TABLET | Freq: Every day | ORAL | Status: DC
  Administered 2011-08-27 – 2011-08-28 (×2): 20 mg via ORAL
  Filled 2011-08-26 (×2): qty 1

## 2011-08-26 MED ORDER — FUROSEMIDE 20 MG PO TABS
20.0000 mg | ORAL_TABLET | Freq: Every day | ORAL | Status: DC
  Administered 2011-08-26: 20 mg via ORAL
  Filled 2011-08-26: qty 1

## 2011-08-26 MED ORDER — SODIUM CHLORIDE 0.9 % IJ SOLN
INTRAMUSCULAR | Status: AC
  Administered 2011-08-26: 3 mL
  Filled 2011-08-26: qty 3

## 2011-08-26 MED ORDER — PREDNISONE 20 MG PO TABS
40.0000 mg | ORAL_TABLET | Freq: Two times a day (BID) | ORAL | Status: DC
  Administered 2011-08-26 – 2011-08-28 (×4): 40 mg via ORAL
  Filled 2011-08-26 (×2): qty 2
  Filled 2011-08-26: qty 1
  Filled 2011-08-26 (×2): qty 2

## 2011-08-26 MED ORDER — DEXTROSE 5 % IV SOLN
INTRAVENOUS | Status: AC
  Filled 2011-08-26: qty 500

## 2011-08-26 MED ORDER — FUROSEMIDE 40 MG PO TABS
40.0000 mg | ORAL_TABLET | Freq: Every day | ORAL | Status: DC
  Administered 2011-08-26: 40 mg via ORAL
  Filled 2011-08-26: qty 1

## 2011-08-26 NOTE — Progress Notes (Signed)
Subjective: Feels better today.  Objective: Vital signs in last 24 hours: Filed Vitals:   08/26/11 0830 08/26/11 1227 08/26/11 1327 08/26/11 1348  BP:   126/49   Pulse:   63   Temp:   97.3 F (36.3 C)   TempSrc:      Resp:   18   Height:      Weight:      SpO2: 94% 94% 96% 94%   Weight change:   Intake/Output Summary (Last 24 hours) at 08/26/11 1507 Last data filed at 08/26/11 0827  Gross per 24 hour  Intake 971.67 ml  Output   1325 ml  Net -353.33 ml   General: Gets short of breath with repositioning in bed. Lungs: Wheeze with no rhonchi or rales Cardiovascular regular rate rhythm without murmurs gallops rubs Abdomen soft nontender nondistended Extremities no clubbing cyanosis or edema  Lab Results: Basic Metabolic Panel:  Lab 08/26/11 1610  NA 138  K 3.8  CL 103  CO2 25  GLUCOSE 157*  BUN 28*  CREATININE 1.50*  CALCIUM 9.5  MG --  PHOS --   Liver Function Tests:  Lab 08/26/11 0525  AST 40*  ALT 40  ALKPHOS 154*  BILITOT 0.5  PROT 6.4  ALBUMIN 2.4*   No results found for this basename: LIPASE:2,AMYLASE:2 in the last 168 hours No results found for this basename: AMMONIA:2 in the last 168 hours CBC:  Lab 08/26/11 0525  WBC 21.3*  NEUTROABS --  HGB 9.6*  HCT 28.4*  MCV 90.4  PLT 312    Micro Results: Recent Results (from the past 240 hour(s))  CULTURE, BLOOD (ROUTINE X 2)     Status: Normal (Preliminary result)   Collection Time   08/25/11  6:07 PM      Component Value Range Status Comment   Specimen Description BLOOD RIGHT HAND   Final    Special Requests BOTTLES DRAWN AEROBIC AND ANAEROBIC 15CC   Final    Culture NO GROWTH 1 DAY   Final    Report Status PENDING   Incomplete   CULTURE, BLOOD (ROUTINE X 2)     Status: Normal (Preliminary result)   Collection Time   08/25/11  6:14 PM      Component Value Range Status Comment   Specimen Description BLOOD LEFT HAND   Final    Special Requests     Final    Value: BOTTLES DRAWN AEROBIC AND  ANAEROBIC AEB=15CC ANA=10CC   Culture NO GROWTH 1 DAY   Final    Report Status PENDING   Incomplete    Studies/Results: Dg Chest 2 View  08/25/2011  *RADIOLOGY REPORT*  Clinical Data: COPD, lung carcinoma  CHEST - 2 VIEW  Comparison: Chest CT of 08/06/2011 and chest x-ray of 12/08/2010  Findings: As demonstrated on recent CT, the opacity in the left upper lobe medially is again noted consistent with neoplasm and possibly postobstructive pneumonitis.  The remainder of the lungs are clear and hyperaerated consistent with a degree of COPD. 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  Cardiomegaly is stable.  There are diffuse degenerative changes throughout the thoracic spine.  IMPRESSION: Left upper lobe opacity worrisome for malignancy and possibly postobstructive pneumonitis.  COPD.  Original Report Authenticated By: Juline Patch, M.D.   Scheduled Meds:   . acetaminophen      . aspirin EC  81 mg Oral Daily  . atorvastatin  40 mg Oral Daily  . azithromycin  500 mg Intravenous  Once  . azithromycin  500 mg Intravenous Q24H  . budesonide-formoterol  2 puff Inhalation BID  . cefTRIAXone (ROCEPHIN) IVPB 1 gram/50 mL D5W  1 g Intravenous Once  . cefTRIAXone (ROCEPHIN)  IV  1 g Intravenous Q24H  . diltiazem  180 mg Oral Daily  . docusate sodium  100 mg Oral BID  . furosemide  20 mg Oral Daily  . heparin  5,000 Units Subcutaneous Q8H  . levalbuterol  0.63 mg Nebulization Q6H  . methylPREDNISolone (SOLU-MEDROL) injection  60 mg Intravenous Q8H  . metoprolol  50 mg Oral Daily  . sodium chloride      . sodium chloride      . terazosin  5 mg Oral QHS  . tiotropium  18 mcg Inhalation Daily  . DISCONTD: azithromycin  500 mg Intravenous Q24H  . DISCONTD: cefTRIAXone (ROCEPHIN)  IV  1 g Intravenous Q24H  . DISCONTD: furosemide  20 mg Oral Q lunch  . DISCONTD: furosemide  20-40 mg Oral TID  . DISCONTD: furosemide  40 mg Oral QAC breakfast   Continuous Infusions:   . sodium chloride 100  mL/hr at 08/25/11 2141   PRN Meds:.acetaminophen, acetaminophen, levalbuterol, morphine injection, ondansetron (ZOFRAN) IV, ondansetron, oxyCODONE Assessment/Plan: Principal Problem:  *Community acquired pneumonia Active Problems:  HYPERTENSION  HYPERLIPIDEMIA  TREMOR, ESSENTIAL  ATHEROSCLEROTIC CARDIOVASCULAR DISEASE  CKD (chronic kidney disease), stage III  ANEMIA  Lung cancer  COPD (chronic obstructive pulmonary disease)  Continue Rocephin and azithromycin. Discontinue telemetry. Change steroids to prednisone 40 mg by mouth twice a day increase activity. Check labs in the morning. Check ambulatory pulse ox. Patient appears quite dyspneic even with repositioning in bed.   LOS: 1 day   Lujain Kraszewski L 08/26/2011, 3:07 PM

## 2011-08-26 NOTE — Care Management Note (Signed)
    Page 1 of 1   08/28/2011     2:12:49 PM   CARE MANAGEMENT NOTE 08/28/2011  Patient:  [name], [name]   Account Number:  [id number]  Date Initiated:  08/26/2011  Documentation initiated by:  Rosemary Holms  Subjective/Objective Assessment:   Pt admitted with PNA and COPD. Newly Dx with Lung CA. PTA lived at home with spouse.     Action/Plan:   Spoke to pt, spouse and sons at bedside. Plan to DC home and then to follow up at [facility] regarding his Lung Ca. They do not anticipate HH needs following his admission but may need something after [facility]. Advised to seek CM at that facility.   Anticipated DC Date:  08/28/2011   Anticipated DC Plan:  HOME/SELF CARE      DC Planning Services  CM consult      Choice offered to / List presented to:             Status of service:  Completed, signed off Medicare Important Message given?   (If response is "NO", the following Medicare IM given date fields will be blank) Date Medicare IM given:   Date Additional Medicare IM given:    Discharge Disposition:  HOME/SELF CARE  Per UR Regulation:    If discussed at Long Length of Stay Meetings, dates discussed:    Comments:  08/28/11 1000 Lamarcus Spira Leanord Hawking RN BSN CM Pt to go by Dr. Fletcher Anon office when [facility]. They will plan further plan of care.with [facility]. No O2 needed for DC.  08/26/11 1100 Xandrea Clarey Leanord Hawking RN BSN CN

## 2011-08-26 NOTE — Progress Notes (Signed)
UR Chart Review Completed  

## 2011-08-26 NOTE — Consult Note (Signed)
ANTIBIOTIC CONSULT NOTE - INITIAL  Pharmacy Consult for monitor ABX (Rocephin and Zithromax)  Indication: rule out pneumonia  Allergies  Allergen Reactions  . Beta Adrenergic Blockers Hives and Swelling  . Levaquin (Levofloxacin In D5w)     nausea  . Penicillins Hives and Swelling  . Sulfonamide Derivatives Hives and Swelling    Patient Measurements: Height: 5\' 9"  (175.3 cm) Weight: 179 lb 0.2 oz (81.2 kg) IBW/kg (Calculated) : 70.7   Vital Signs: Temp: 97.7 F (36.5 C) (05/15 0431) Temp src: Oral (05/15 0431) BP: 130/62 mmHg (05/15 0431) Pulse Rate: 83  (05/15 0431) Intake/Output from previous day: 05/14 0701 - 05/15 0700 In: 731.7 [I.V.:731.7] Out: 1325 [Urine:1325] Intake/Output from this shift: Total I/O In: 240 [P.O.:240] Out: -   Labs:  Basename 08/26/11 0525  WBC 21.3*  HGB 9.6*  PLT 312  LABCREA --  CREATININE 1.50*   Estimated Creatinine Clearance: 34.7 ml/min (by C-G formula based on Cr of 1.5). No results found for this basename: VANCOTROUGH:2,VANCOPEAK:2,VANCORANDOM:2,GENTTROUGH:2,GENTPEAK:2,GENTRANDOM:2,TOBRATROUGH:2,TOBRAPEAK:2,TOBRARND:2,AMIKACINPEAK:2,AMIKACINTROU:2,AMIKACIN:2, in the last 72 hours   Microbiology: No results found for this or any previous visit (from the past 720 hour(s)).  Medical History: Past Medical History  Diagnosis Date  . ASCVD (arteriosclerotic cardiovascular disease)      CABG in 04/1993; and negative stress nuclear study in 08/2001  . Hyperlipidemia   . Syncope   . Hypertension   . Tobacco abuse, in remission     40 pack years; discontinued in 1980  . Cerebrovascular disease     Right carotid bruit-40-69% left internal carotid artery stenosis in 4/06; followed [facility]  . Chronic kidney disease, stage II (mild)     Creatinine-1.6 in 09/2008  . Degenerative joint disease of knee, left   . Normocytic anemia   . Pulmonary disease   . Cancer     skin  . Lung cancer 2013    Medications:  Scheduled:    .  acetaminophen      . aspirin EC  81 mg Oral Daily  . atorvastatin  40 mg Oral Daily  . azithromycin  500 mg Intravenous Once  . azithromycin  500 mg Intravenous Q24H  . budesonide-formoterol  2 puff Inhalation BID  . cefTRIAXone (ROCEPHIN) IVPB 1 gram/50 mL D5W  1 g Intravenous Once  . cefTRIAXone (ROCEPHIN)  IV  1 g Intravenous Q24H  . diltiazem  180 mg Oral Daily  . docusate sodium  100 mg Oral BID  . furosemide  20 mg Oral Q lunch  . furosemide  20-40 mg Oral TID  . furosemide  40 mg Oral QAC breakfast  . heparin  5,000 Units Subcutaneous Q8H  . levalbuterol  0.63 mg Nebulization Q6H  . methylPREDNISolone (SOLU-MEDROL) injection  60 mg Intravenous Q8H  . metoprolol  50 mg Oral Daily  . sodium chloride      . terazosin  5 mg Oral QHS  . tiotropium  18 mcg Inhalation Daily  . DISCONTD: azithromycin  500 mg Intravenous Q24H  . DISCONTD: cefTRIAXone (ROCEPHIN)  IV  1 g Intravenous Q24H   Assessment: Currently on Rocephin and Zithromax SCr slightly elevated, may improve with hydration  Goal of Therapy:  Eradicate infection.  Plan:  Continue current regimen, no adjustment needed  Valrie Hart A 08/26/2011,9:57 AM

## 2011-08-27 DIAGNOSIS — J438 Other emphysema: Secondary | ICD-10-CM

## 2011-08-27 DIAGNOSIS — R7989 Other specified abnormal findings of blood chemistry: Secondary | ICD-10-CM

## 2011-08-27 DIAGNOSIS — D696 Thrombocytopenia, unspecified: Secondary | ICD-10-CM

## 2011-08-27 DIAGNOSIS — C3490 Malignant neoplasm of unspecified part of unspecified bronchus or lung: Secondary | ICD-10-CM

## 2011-08-27 LAB — GLUCOSE, CAPILLARY
Glucose-Capillary: 209 mg/dL — ABNORMAL HIGH (ref 70–99)
Glucose-Capillary: 183 mg/dL — ABNORMAL HIGH (ref 70–99)
Glucose-Capillary: 160 mg/dL — ABNORMAL HIGH (ref 70–99)

## 2011-08-27 LAB — BASIC METABOLIC PANEL
BUN: 32 mg/dL — ABNORMAL HIGH (ref 6–23)
GFR calc Af Amer: 43 mL/min — ABNORMAL LOW (ref >90)
GFR calc non Af Amer: 37 mL/min — ABNORMAL LOW (ref >90)
Potassium: 3.7 mEq/L (ref 3.5–5.1)
Sodium: 137 mEq/L (ref 135–145)

## 2011-08-27 LAB — CBC
MCHC: 32.8 g/dL (ref 30.0–36.0)
Platelets: 358 10*3/uL (ref 150–400)
RDW: 13.9 % (ref 11.5–15.5)

## 2011-08-27 LAB — LEGIONELLA ANTIGEN, URINE: Legionella Antigen, Urine: NEGATIVE

## 2011-08-27 MED ORDER — SODIUM CHLORIDE 0.9 % IJ SOLN
INTRAMUSCULAR | Status: AC
  Administered 2011-08-27: 3 mL
  Filled 2011-08-27: qty 3

## 2011-08-27 MED ORDER — INSULIN ASPART 100 UNIT/ML ~~LOC~~ SOLN: 0.0000 [IU] | Freq: Every day | SUBCUTANEOUS | Status: DC

## 2011-08-27 MED ORDER — INSULIN ASPART 100 UNIT/ML ~~LOC~~ SOLN
0.0000 [IU] | Freq: Three times a day (TID) | SUBCUTANEOUS | Status: DC
  Administered 2011-08-27: 3 [IU] via SUBCUTANEOUS
  Administered 2011-08-27: 2 [IU] via SUBCUTANEOUS
  Administered 2011-08-28: 3 [IU] via SUBCUTANEOUS
  Administered 2011-08-28: 1 [IU] via SUBCUTANEOUS

## 2011-08-27 NOTE — Progress Notes (Signed)
Pt ambulated in hallway with NT this afternoon.  O2 sat at rest was 95% RA.  O2 sat after ambulating approximately 50 ft in hallway was 93% RA.

## 2011-08-27 NOTE — Progress Notes (Signed)
Subjective: The patient is sitting up in the bed. He has no complaints of shortness of breath at rest, but he becomes very short of breath with any type of activity. He denies chest pain and cough.  Objective: Vital signs in last 24 hours: Filed Vitals:   08/27/11 0120 08/27/11 0542 08/27/11 0724 08/27/11 1025  BP: 162/67 147/68  154/66  Pulse: 99 91  78  Temp: 97.4 F (36.3 C) 97.9 F (36.6 C)    TempSrc: Oral Oral    Resp: 24 20    Height:      Weight:      SpO2: 95% 92% 94%     Intake/Output Summary (Last 24 hours) at 08/27/11 1108 Last data filed at 08/27/11 0900  Gross per 24 hour  Intake    490 ml  Output   1425 ml  Net   -935 ml    Weight change:   Physical exam: Lungs: A few rhonchus wheezing noted bilaterally, but mostly on the left. Heart: S1, S2, with no murmurs rubs or gallops. Next line abdomen: Positive bowel sounds, soft, nontender, nondistended. Extremities: No pedal edema.  Lab Results: Basic Metabolic Panel:  Basename 08/27/11 0540 08/26/11 0525  NA 137 138  K 3.7 3.8  CL 99 103  CO2 24 25  GLUCOSE 175* 157*  BUN 32* 28*  CREATININE 1.60* 1.50*  CALCIUM 10.0 9.5  MG -- --  PHOS -- --   Liver Function Tests:  [facility] 08/26/11 0525  AST 40*  ALT 40  ALKPHOS 154*  BILITOT 0.5  PROT 6.4  ALBUMIN 2.4*   No results found for this basename: LIPASE:2,AMYLASE:2 in the last 72 hours No results found for this basename: AMMONIA:2 in the last 72 hours CBC:  Basename 08/27/11 0540 08/26/11 0525  WBC 29.4* 21.3*  NEUTROABS -- --  HGB 10.1* 9.6*  HCT 30.8* 28.4*  MCV 90.6 90.4  PLT 358 312   Cardiac Enzymes: No results found for this basename: CKTOTAL:3,CKMB:3,CKMBINDEX:3,TROPONINI:3 in the last 72 hours BNP: No results found for this basename: PROBNP:3 in the last 72 hours D-Dimer: No results found for this basename: DDIMER:2 in the last 72 hours CBG: No results found for this basename: GLUCAP:6 in the last 72 hours Hemoglobin  A1C: No results found for this basename: HGBA1C in the last 72 hours Fasting Lipid Panel: No results found for this basename: CHOL,HDL,LDLCALC,TRIG,CHOLHDL,LDLDIRECT in the last 72 hours Thyroid Function Tests: No results found for this basename: TSH,T4TOTAL,FREET4,T3FREE,THYROIDAB in the last 72 hours Anemia Panel: No results found for this basename: VITAMINB12,FOLATE,FERRITIN,TIBC,IRON,RETICCTPCT in the last 72 hours Coagulation: No results found for this basename: LABPROT:2,INR:2 in the last 72 hours Urine Drug Screen: Drugs of Abuse  No results found for this basename: labopia, cocainscrnur, labbenz, amphetmu, thcu, labbarb    Alcohol Level: No results found for this basename: ETH:2 in the last 72 hours Urinalysis: No results found for this basename: COLORURINE:2,APPERANCEUR:2,LABSPEC:2,PHURINE:2,GLUCOSEU:2,HGBUR:2,BILIRUBINUR:2,KETONESUR:2,PROTEINUR:2,UROBILINOGEN:2,NITRITE:2,LEUKOCYTESUR:2 in the last 72 hours Misc. Labs:   Micro: Recent Results (from the past 240 hour(s))  CULTURE, BLOOD (ROUTINE X 2)     Status: Normal (Preliminary result)   Collection Time   08/25/11  6:07 PM      Component Value Range Status Comment   Specimen Description BLOOD RIGHT HAND   Final    Special Requests BOTTLES DRAWN AEROBIC AND ANAEROBIC 15CC   Final    Culture NO GROWTH 1 DAY   Final    Report Status PENDING   Incomplete   CULTURE,  BLOOD (ROUTINE X 2)     Status: Normal (Preliminary result)   Collection Time   08/25/11  6:14 PM      Component Value Range Status Comment   Specimen Description BLOOD LEFT HAND   Final    Special Requests     Final    Value: BOTTLES DRAWN AEROBIC AND ANAEROBIC AEB=15CC ANA=10CC   Culture NO GROWTH 1 DAY   Final    Report Status PENDING   Incomplete     Studies/Results: No results found.  Medications:  Scheduled:   . aspirin EC  81 mg Oral Daily  . atorvastatin  40 mg Oral Daily  . azithromycin  500 mg Intravenous Q24H  . budesonide-formoterol  2  puff Inhalation BID  . cefTRIAXone (ROCEPHIN)  IV  1 g Intravenous Q24H  . diltiazem  180 mg Oral Daily  . docusate sodium  100 mg Oral BID  . furosemide  20 mg Oral Daily  . heparin  5,000 Units Subcutaneous Q8H  . insulin aspart  0-5 Units Subcutaneous QHS  . insulin aspart  0-9 Units Subcutaneous TID WC  . levalbuterol  0.63 mg Nebulization Q6H  . metoprolol  50 mg Oral Daily  . predniSONE  40 mg Oral BID WC  . sodium chloride      . sodium chloride      . terazosin  5 mg Oral QHS  . tiotropium  18 mcg Inhalation Daily  . DISCONTD: furosemide  20 mg Oral Q lunch  . DISCONTD: furosemide  20-40 mg Oral TID  . DISCONTD: furosemide  40 mg Oral QAC breakfast  . DISCONTD: methylPREDNISolone (SOLU-MEDROL) injection  60 mg Intravenous Q8H   Continuous:  ZOX:[id number], acetaminophen, levalbuterol, morphine injection, ondansetron (ZOFRAN) IV, ondansetron, oxyCODONE  Assessment: Principal Problem:  *Community acquired pneumonia Active Problems:  HYPERLIPIDEMIA  ANEMIA  TREMOR, ESSENTIAL  HYPERTENSION  ATHEROSCLEROTIC CARDIOVASCULAR DISEASE  CKD (chronic kidney disease), stage III  Lung cancer  COPD (chronic obstructive pulmonary disease)   1. Community-acquired pneumonia. We'll continue Rocephin and azithromycin.  2. COPD with exacerbation. We'll continue steroids and bronchodilator therapy. Next  3. Hypertension. Blood pressure is slightly elevated. We'll continue diltiazem, metoprolol, and Hytrin.  4. Lung cancer. The plan is for him to followup with his oncologist or radiation oncologist at [facility] in the next week or 2.  5. Normocytic anemia. This is likely anemia of chronic disease. Further management will be deferred to his primary care physician as the patient has a history of chronic anemia.  6.Chronic kidney disease, stage III. Stable.  7. Leukocytosis, likely secondary to steroids.  8. Steroid-induced hyperglycemia.  Plan:  1. Continue current management.  We'll add sliding scale NovoLog while he's on steroids. We'll check a hemoglobin A1c. 2. Will encourage the patient to increase activity as tolerated. 3. Nursing staff was asked to check his oxygen saturations on room air at rest and with activity. 4. Probable discharge tomorrow.    LOS: 2 days   [name] 08/27/2011, 11:08 AM

## 2011-08-28 ENCOUNTER — Encounter (HOSPITAL_COMMUNITY): Payer: Self-pay | Admitting: Internal Medicine

## 2011-08-28 DIAGNOSIS — D696 Thrombocytopenia, unspecified: Secondary | ICD-10-CM

## 2011-08-28 DIAGNOSIS — R112 Nausea with vomiting, unspecified: Secondary | ICD-10-CM

## 2011-08-28 DIAGNOSIS — R7989 Other specified abnormal findings of blood chemistry: Secondary | ICD-10-CM

## 2011-08-28 DIAGNOSIS — J438 Other emphysema: Secondary | ICD-10-CM

## 2011-08-28 DIAGNOSIS — R7303 Prediabetes: Secondary | ICD-10-CM | POA: Diagnosis present

## 2011-08-28 LAB — BASIC METABOLIC PANEL
Chloride: 102 mEq/L (ref 96–112)
GFR calc Af Amer: 45 mL/min — ABNORMAL LOW (ref >90)
GFR calc non Af Amer: 39 mL/min — ABNORMAL LOW (ref >90)
Potassium: 3.8 mEq/L (ref 3.5–5.1)
Sodium: 139 mEq/L (ref 135–145)

## 2011-08-28 LAB — GLUCOSE, CAPILLARY: Glucose-Capillary: 203 mg/dL — ABNORMAL HIGH (ref 70–99)

## 2011-08-28 LAB — CBC
HCT: 28.9 % — ABNORMAL LOW (ref 39.0–52.0)
Hemoglobin: 9.5 g/dL — ABNORMAL LOW (ref 13.0–17.0)
MCHC: 32.9 g/dL (ref 30.0–36.0)
RDW: 13.9 % (ref 11.5–15.5)
WBC: 25.2 10*3/uL — ABNORMAL HIGH (ref 4.0–10.5)

## 2011-08-28 LAB — HEMOGLOBIN A1C
Hgb A1c MFr Bld: 6.7 % — ABNORMAL HIGH (ref <5.7)
Mean Plasma Glucose: 146 mg/dL — ABNORMAL HIGH (ref <117)

## 2011-08-28 MED ORDER — CEFPROZIL 500 MG PO TABS: 500.0000 mg | ORAL_TABLET | Freq: Two times a day (BID) | ORAL | Status: AC

## 2011-08-28 MED ORDER — ALBUTEROL SULFATE HFA 108 (90 BASE) MCG/ACT IN AERS: INHALATION_SPRAY | RESPIRATORY_TRACT | Status: DC

## 2011-08-28 NOTE — Discharge Summary (Signed)
Physician Discharge Summary  [name] MRN: [id number] DOB/AGE: 11/15/1921 78 y.o.  PCP: Lilyan Punt, MD, MD   Admit date: 08/25/2011 Discharge date: 08/28/2011  Discharge Diagnoses:  1. Community-acquired pneumonia/postobstructive pneumonia. 2. COPD with mild exacerbation. 3. Recently diagnosed lung cancer. 4. Prediabetes versus steroid-induced hyperglycemia. The patient's hemoglobin A1c was 6.7. 5. Stage II to stage III chronic kidney disease. 6. Anemia, likely of chronic disease. His hemoglobin ranged from 9.5-10.1. Further evaluation and management will deferred to his primary care physician. 7. Atherosclerotic cardiovascular disease. 8. Hypertension. 9. Chronic essential tremor. 10. Hyperlipidemia.   Medication List  As of 08/28/2011  2:47 PM   TAKE these medications         albuterol 108 (90 BASE) MCG/ACT inhaler   Commonly known as: PROVENTIL HFA;VENTOLIN HFA   INHALE 2 PUFFS 3-4 TIMES DAILY.      aspirin EC 81 MG tablet   Take 81 mg by mouth daily.      atorvastatin 40 MG tablet   Commonly known as: LIPITOR   Take 1 tablet (40 mg total) by mouth daily.      budesonide-formoterol 160-4.5 MCG/ACT inhaler   Commonly known as: SYMBICORT   Inhale 2 puffs into the lungs 2 (two) times daily.      cefPROZIL 500 MG tablet   Commonly known as: CEFZIL   Take 1 tablet (500 mg total) by mouth 2 (two) times daily. TAKE FOR 5 MORE DAYS.      diltiazem 180 MG 24 hr capsule   Commonly known as: CARDIZEM CD   Take 180 mg by mouth daily.      fluticasone 50 MCG/ACT nasal spray   Commonly known as: FLONASE   Place 2 sprays into the nose daily.      furosemide 20 MG tablet   Commonly known as: LASIX   Take 20 mg by mouth daily.      metoprolol 50 MG tablet   Commonly known as: LOPRESSOR   Take 50 mg by mouth daily.      nitroGLYCERIN 0.4 MG SL tablet   Commonly known as: NITROSTAT   Place 1 tablet (0.4 mg total) under the tongue every 5 (five) minutes as  needed for chest pain.      predniSONE 10 MG tablet   Commonly known as: STERAPRED UNI-PAK   Take 20 mg by mouth 2 (two) times daily. Prednisone 20mg  twice daily for four days then once daily for four days      terazosin 5 MG capsule   Commonly known as: HYTRIN   Take 5 mg by mouth at bedtime.      tetrahydrozoline-zinc 0.05-0.25 % ophthalmic solution   Commonly known as: VISINE-AC   Place 1 drop into both eyes daily.      tiotropium 18 MCG inhalation capsule   Commonly known as: SPIRIVA   Place 18 mcg into inhaler and inhale daily.            Discharge Condition: Improved.  Disposition: 01-Home or Self Care   Consults: None.   Significant Diagnostic Studies: Dg Chest 2 View  08/25/2011  *RADIOLOGY REPORT*  Clinical Data: COPD, lung carcinoma  CHEST - 2 VIEW  Comparison: Chest CT of 08/06/2011 and chest x-ray of 12/08/2010  Findings: As demonstrated on recent CT, the opacity in the left upper lobe medially is again noted consistent with neoplasm and possibly postobstructive pneumonitis.  The remainder of the lungs are clear and hyperaerated consistent with a degree of  COPD. 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  Cardiomegaly is stable.  There are diffuse degenerative changes throughout the thoracic spine.  IMPRESSION: Left upper lobe opacity worrisome for malignancy and possibly postobstructive pneumonitis.  COPD.  Original Report Authenticated By: Juline Patch, M.D.   Ct Chest Wo Contrast  08/06/2011  *RADIOLOGY REPORT*  Clinical Data: Hemoptysis.  Dyspnea on exertion.  CT CHEST WITHOUT CONTRAST  Technique:  Multidetector CT imaging of the chest was performed following the standard protocol without IV contrast.  Comparison: 08/19/2007.  Findings: Mediastinal lymph nodes measure up to 8 mm in the AP window.  Hilar regions are difficult to definitively evaluate without IV contrast.  No axillary adenopathy.  Atherosclerotic calcification of the arterial vasculature.   Heart size is within normal limits.  No pericardial effusion.  Small hiatal hernia.  Mild biapical pleural parenchymal scarring.  A 4.9 x 5.0 cm irregular mass is seen in the medial left upper lobe and obstructs the anterior segmental bronchus. Surrounding nodularity, ground- glass and septal thickening in the apical left upper lobe. It has a long border of contact with the adjacent mediastinum, with preservation of the fat plane between it and the transverse aorta. Scarring in the right middle lobe and right lower lobe.  No pleural fluid.  Airway is otherwise unremarkable.  Incidental imaging of the upper abdomen shows no acute findings. Small stones layer in the gallbladder.  No worrisome lytic or sclerotic lesions.  Degenerative changes are seen in the spine.  IMPRESSION:  1.  Left upper lobe mass is highly worrisome for primary bronchogenic carcinoma, with postobstructive pneumonitis and/or lymphangitic carcinomatosis in the apical left upper lobe. Consultation with the [facility] [phone]) should be considered. These results will be called to the ordering clinician or representative by the Radiologist Assistant, and communication documented in the PACS Dashboard. 2.  Cholelithiasis.  Original Report Authenticated By: Reyes Ivan, M.D.    Microbiology: Recent Results (from the past 240 hour(s))  CULTURE, BLOOD (ROUTINE X 2)     Status: Normal (Preliminary result)   Collection Time   08/25/11  6:07 PM      Component Value Range Status Comment   Specimen Description BLOOD RIGHT HAND   Final    Special Requests BOTTLES DRAWN AEROBIC AND ANAEROBIC 15CC   Final    Culture NO GROWTH 2 DAYS   Final    Report Status PENDING   Incomplete   CULTURE, BLOOD (ROUTINE X 2)     Status: Normal (Preliminary result)   Collection Time   08/25/11  6:14 PM      Component Value Range Status Comment   Specimen Description BLOOD LEFT HAND   Final    Special Requests      Final    Value: BOTTLES DRAWN AEROBIC AND ANAEROBIC AEB=15CC ANA=10CC   Culture NO GROWTH 2 DAYS   Final    Report Status PENDING   Incomplete      Labs: Results for orders placed during the hospital encounter of 08/25/11 (from the past 48 hour(s))  CBC     Status: Abnormal   Collection Time   08/27/11  5:40 AM      Component Value Range Comment   WBC 29.4 (*) 4.0 - 10.5 (K/uL)    RBC 3.40 (*) 4.22 - 5.81 (MIL/uL)    Hemoglobin 10.1 (*) 13.0 - 17.0 (g/dL)    HCT 56.2 (*) 13.0 - 52.0 (%)    MCV 90.6  78.0 - 100.0 (fL)    MCH 29.7  26.0 - 34.0 (pg)    MCHC 32.8  30.0 - 36.0 (g/dL)    RDW 29.5  62.1 - 30.8 (%)    Platelets 358  150 - 400 (K/uL)   BASIC METABOLIC PANEL     Status: Abnormal   Collection Time   08/27/11  5:40 AM      Component Value Range Comment   Sodium 137  135 - 145 (mEq/L)    Potassium 3.7  3.5 - 5.1 (mEq/L)    Chloride 99  96 - 112 (mEq/L)    CO2 24  19 - 32 (mEq/L)    Glucose, Bld 175 (*) 70 - 99 (mg/dL)    BUN 32 (*) 6 - 23 (mg/dL)    Creatinine, Ser 6.57 (*) 0.50 - 1.35 (mg/dL)    Calcium 84.6  8.4 - 10.5 (mg/dL)    GFR calc non Af Amer 37 (*) >90 (mL/min)    GFR calc Af Amer 43 (*) >90 (mL/min)   HEMOGLOBIN A1C     Status: Abnormal   Collection Time   08/27/11 11:11 AM      Component Value Range Comment   Hemoglobin A1C 6.7 (*) <5.7 (%)    Mean Plasma Glucose 146 (*) <117 (mg/dL)   GLUCOSE, CAPILLARY     Status: Abnormal   Collection Time   08/27/11 11:34 AM      Component Value Range Comment   Glucose-Capillary 209 (*) 70 - 99 (mg/dL)    Comment 1 Notify RN     GLUCOSE, CAPILLARY     Status: Abnormal   Collection Time   08/27/11  5:31 PM      Component Value Range Comment   Glucose-Capillary 183 (*) 70 - 99 (mg/dL)   GLUCOSE, CAPILLARY     Status: Abnormal   Collection Time   08/27/11  9:01 PM      Component Value Range Comment   Glucose-Capillary 160 (*) 70 - 99 (mg/dL)   CBC     Status: Abnormal   Collection Time   08/28/11  5:31 AM       Component Value Range Comment   WBC 25.2 (*) 4.0 - 10.5 (K/uL)    RBC 3.20 (*) 4.22 - 5.81 (MIL/uL)    Hemoglobin 9.5 (*) 13.0 - 17.0 (g/dL)    HCT 96.2 (*) 95.2 - 52.0 (%)    MCV 90.3  78.0 - 100.0 (fL)    MCH 29.7  26.0 - 34.0 (pg)    MCHC 32.9  30.0 - 36.0 (g/dL)    RDW 84.1  32.4 - 40.1 (%)    Platelets 364  150 - 400 (K/uL)   BASIC METABOLIC PANEL     Status: Abnormal   Collection Time   08/28/11  5:31 AM      Component Value Range Comment   Sodium 139  135 - 145 (mEq/L)    Potassium 3.8  3.5 - 5.1 (mEq/L)    Chloride 102  96 - 112 (mEq/L)    CO2 23  19 - 32 (mEq/L)    Glucose, Bld 200 (*) 70 - 99 (mg/dL)    BUN 39 (*) 6 - 23 (mg/dL)    Creatinine, Ser 0.27 (*) 0.50 - 1.35 (mg/dL)    Calcium 9.6  8.4 - 10.5 (mg/dL)    GFR calc non Af Amer 39 (*) >90 (mL/min)    GFR calc Af Amer 45 (*) >90 (mL/min)  GLUCOSE, CAPILLARY     Status: Abnormal   Collection Time   08/28/11  8:25 AM      Component Value Range Comment   Glucose-Capillary 203 (*) 70 - 99 (mg/dL)    Comment 1 Notify RN     GLUCOSE, CAPILLARY     Status: Abnormal   Collection Time   08/28/11 11:11 AM      Component Value Range Comment   Glucose-Capillary 125 (*) 70 - 99 (mg/dL)    Comment 1 Notify RN        HPI : The patient is an 78 year old man with a history significant for COPD, coronary artery disease, and recently diagnosed lung cancer, who presented to the emergency department on Aug 25, 2011 with a chief complaint of shortness of breath. The patient was started on oral antibiotics and prednisone by his primary care physician prior to admission. In the emergency department, the patient was noted to be febrile with a temperature of 101.3 but otherwise hemodynamically stable. His chest x-ray revealed left upper lobe opacity worrisome for malignancy and possibly postobstructive pneumonitis and COPD. Blood work from Dr.Luking's office revealed a white blood cell count of 23.6, hemoglobin of 10.1, platelet count of  291, and creatinine of 1.66. He was admitted for further evaluation and management.  HOSPITAL COURSE: The patient was started on IV with Rocephin and azithromycin. He was maintained on bronchodilator therapy, but with Xopenex nebulizer. He was continued on Spiriva daily. He was also maintained on Symbicort. Steroid therapy was continued with Solu-Medrol. Supportive and symptomatic treatment was also given. He was maintained on all of his other chronic medications.  For further evaluation, a number of studies were ordered. His blood cultures remained negative as of the time of this dictation. His urine Legionella antigen and urine strep pneumo antigens were both negative. His hemoglobin A1c was 6.7. His followup WBC increased to 29.4 but stabilized at 25.2 prior to discharge. His creatinine remained stable at 1.55.  The patient was started on sliding scale NovoLog for steroid-induced hyperglycemia. As stated above, his hemoglobin A1c was assessed and it was elevated at 6.7. This may be in part due to recent steroid therapy versus prediabetes versus impending diabetes. Rather than starting an oral medication, the patient was educated on a carbohydrate modified diet and diabetes in general. Further management was deferred to his primary care physician Dr. Gerda Diss who can monitor his blood sugars while the patient is off of prednisone.  The patient improved clinically and symptomatically. His oxygen saturations on room air, prior to discharge, ranged in the lower to mid 90s at rest and with activity. He was discharged on the previous prednisone taper that was started in the outpatient setting. He was also discharged on Cefprozil which was started prior to to this hospitalization. He will followup with his primary care physician for a referral back to [facility] for further management of his lung cancer.    Discharge Exam: Blood pressure 138/54, pulse 81, temperature 97.8 F (36.6 C), temperature source Oral,  resp. rate 20, height 5\' 9"  (1.753 m), weight 81.2 kg (179 lb 0.2 oz), SpO2 98.00%.  Lungs: Occasional wheezing, significantly decreased. Heart: S1, S2, no murmurs, rubs, or gallops. Abdomen: Positive bowel sounds, soft, nontender, nondistended. Extremities: No pedal edema.   Discharge Orders    Future Orders Please Complete By Expires   Diet - low sodium heart healthy      Diet Carb Modified      Increase activity slowly  Discharge instructions      Comments:   FOLLOW UP WITH YOUR DOCTOR AS RECOMMENDED. DISCUSS MONITORING YOUR BORDERLINE DIABETES WITH DR. Gerda Diss.       Follow-up Information    Follow up with Lilyan Punt, MD on 08/31/2011.   Contact information:   [address] [place] [address] [phone]           Total discharge time: 35 minutes.  Signed: Ashlea Dusing 08/28/2011, 2:47 PM

## 2011-08-28 NOTE — Progress Notes (Signed)
Glycemic Control Recommendations   Elevated HgbA1C of 6.7% with no history of Diabetes.  Patient on prednisone currently at home.  In addition, glucose in 200s while inpatient.  Recommendation:        Please address elevated HgbA1C with patient.  May consider adding oral agents to patient medication regimen.

## 2011-08-28 NOTE — Progress Notes (Signed)
Discharge instructions reviewed with patient and his son, both patient and son voiced understanding. Discharge instructions given to patient's son per patient's request. Son signed discharge instructions per patient's request. Patient in stable condition and transported out by tech.

## 2011-08-30 LAB — CULTURE, BLOOD (ROUTINE X 2): Culture: NO GROWTH

## 2011-08-31 DIAGNOSIS — R7309 Other abnormal glucose: Secondary | ICD-10-CM | POA: Diagnosis not present

## 2011-08-31 DIAGNOSIS — J449 Chronic obstructive pulmonary disease, unspecified: Secondary | ICD-10-CM | POA: Diagnosis not present

## 2011-08-31 DIAGNOSIS — J189 Pneumonia, unspecified organism: Secondary | ICD-10-CM | POA: Diagnosis not present

## 2011-09-02 DIAGNOSIS — C3490 Malignant neoplasm of unspecified part of unspecified bronchus or lung: Secondary | ICD-10-CM | POA: Diagnosis not present

## 2011-09-09 DIAGNOSIS — Z882 Allergy status to sulfonamides status: Secondary | ICD-10-CM | POA: Diagnosis not present

## 2011-09-09 DIAGNOSIS — Z880 Allergy status to penicillin: Secondary | ICD-10-CM | POA: Diagnosis not present

## 2011-09-09 DIAGNOSIS — Z7982 Long term (current) use of aspirin: Secondary | ICD-10-CM | POA: Diagnosis not present

## 2011-09-09 DIAGNOSIS — Z808 Family history of malignant neoplasm of other organs or systems: Secondary | ICD-10-CM | POA: Diagnosis not present

## 2011-09-09 DIAGNOSIS — J449 Chronic obstructive pulmonary disease, unspecified: Secondary | ICD-10-CM | POA: Diagnosis not present

## 2011-09-09 DIAGNOSIS — C3410 Malignant neoplasm of upper lobe, unspecified bronchus or lung: Secondary | ICD-10-CM | POA: Diagnosis not present

## 2011-09-09 DIAGNOSIS — E785 Hyperlipidemia, unspecified: Secondary | ICD-10-CM | POA: Diagnosis not present

## 2011-09-09 DIAGNOSIS — N400 Enlarged prostate without lower urinary tract symptoms: Secondary | ICD-10-CM | POA: Diagnosis not present

## 2011-09-09 DIAGNOSIS — Z800 Family history of malignant neoplasm of digestive organs: Secondary | ICD-10-CM | POA: Diagnosis not present

## 2011-09-09 DIAGNOSIS — I2510 Atherosclerotic heart disease of native coronary artery without angina pectoris: Secondary | ICD-10-CM | POA: Diagnosis not present

## 2011-09-09 DIAGNOSIS — I10 Essential (primary) hypertension: Secondary | ICD-10-CM | POA: Diagnosis not present

## 2011-09-09 DIAGNOSIS — Z79899 Other long term (current) drug therapy: Secondary | ICD-10-CM | POA: Diagnosis not present

## 2011-09-09 DIAGNOSIS — K5730 Diverticulosis of large intestine without perforation or abscess without bleeding: Secondary | ICD-10-CM | POA: Diagnosis not present

## 2011-09-10 DIAGNOSIS — C3490 Malignant neoplasm of unspecified part of unspecified bronchus or lung: Secondary | ICD-10-CM | POA: Diagnosis not present

## 2011-09-10 DIAGNOSIS — Z79899 Other long term (current) drug therapy: Secondary | ICD-10-CM | POA: Diagnosis not present

## 2011-09-16 DIAGNOSIS — I2510 Atherosclerotic heart disease of native coronary artery without angina pectoris: Secondary | ICD-10-CM | POA: Diagnosis not present

## 2011-09-16 DIAGNOSIS — C3490 Malignant neoplasm of unspecified part of unspecified bronchus or lung: Secondary | ICD-10-CM | POA: Diagnosis not present

## 2011-09-16 DIAGNOSIS — I10 Essential (primary) hypertension: Secondary | ICD-10-CM | POA: Diagnosis not present

## 2011-09-16 DIAGNOSIS — Z87891 Personal history of nicotine dependence: Secondary | ICD-10-CM | POA: Diagnosis not present

## 2011-09-16 DIAGNOSIS — C3410 Malignant neoplasm of upper lobe, unspecified bronchus or lung: Secondary | ICD-10-CM | POA: Diagnosis not present

## 2011-09-16 DIAGNOSIS — E785 Hyperlipidemia, unspecified: Secondary | ICD-10-CM | POA: Diagnosis not present

## 2011-09-16 DIAGNOSIS — Z951 Presence of aortocoronary bypass graft: Secondary | ICD-10-CM | POA: Diagnosis not present

## 2011-09-17 DIAGNOSIS — C3410 Malignant neoplasm of upper lobe, unspecified bronchus or lung: Secondary | ICD-10-CM | POA: Diagnosis not present

## 2011-09-17 DIAGNOSIS — E785 Hyperlipidemia, unspecified: Secondary | ICD-10-CM | POA: Diagnosis not present

## 2011-09-17 DIAGNOSIS — I2510 Atherosclerotic heart disease of native coronary artery without angina pectoris: Secondary | ICD-10-CM | POA: Diagnosis not present

## 2011-09-17 DIAGNOSIS — I10 Essential (primary) hypertension: Secondary | ICD-10-CM | POA: Diagnosis not present

## 2011-09-17 DIAGNOSIS — Z87891 Personal history of nicotine dependence: Secondary | ICD-10-CM | POA: Diagnosis not present

## 2011-09-17 DIAGNOSIS — Z951 Presence of aortocoronary bypass graft: Secondary | ICD-10-CM | POA: Diagnosis not present

## 2011-09-17 DIAGNOSIS — C3490 Malignant neoplasm of unspecified part of unspecified bronchus or lung: Secondary | ICD-10-CM | POA: Diagnosis not present

## 2011-09-24 DIAGNOSIS — I10 Essential (primary) hypertension: Secondary | ICD-10-CM | POA: Diagnosis not present

## 2011-09-24 DIAGNOSIS — C3410 Malignant neoplasm of upper lobe, unspecified bronchus or lung: Secondary | ICD-10-CM | POA: Diagnosis not present

## 2011-09-24 DIAGNOSIS — Z87891 Personal history of nicotine dependence: Secondary | ICD-10-CM | POA: Diagnosis not present

## 2011-09-24 DIAGNOSIS — C3490 Malignant neoplasm of unspecified part of unspecified bronchus or lung: Secondary | ICD-10-CM | POA: Diagnosis not present

## 2011-09-24 DIAGNOSIS — Z951 Presence of aortocoronary bypass graft: Secondary | ICD-10-CM | POA: Diagnosis not present

## 2011-09-24 DIAGNOSIS — I2510 Atherosclerotic heart disease of native coronary artery without angina pectoris: Secondary | ICD-10-CM | POA: Diagnosis not present

## 2011-09-24 DIAGNOSIS — E785 Hyperlipidemia, unspecified: Secondary | ICD-10-CM | POA: Diagnosis not present

## 2011-09-29 DIAGNOSIS — Z87891 Personal history of nicotine dependence: Secondary | ICD-10-CM | POA: Diagnosis not present

## 2011-09-29 DIAGNOSIS — C3490 Malignant neoplasm of unspecified part of unspecified bronchus or lung: Secondary | ICD-10-CM | POA: Diagnosis not present

## 2011-09-29 DIAGNOSIS — I10 Essential (primary) hypertension: Secondary | ICD-10-CM | POA: Diagnosis not present

## 2011-09-29 DIAGNOSIS — Z510 Encounter for antineoplastic radiation therapy: Secondary | ICD-10-CM | POA: Diagnosis not present

## 2011-09-29 DIAGNOSIS — E785 Hyperlipidemia, unspecified: Secondary | ICD-10-CM | POA: Diagnosis not present

## 2011-09-29 DIAGNOSIS — Z951 Presence of aortocoronary bypass graft: Secondary | ICD-10-CM | POA: Diagnosis not present

## 2011-09-29 DIAGNOSIS — I2510 Atherosclerotic heart disease of native coronary artery without angina pectoris: Secondary | ICD-10-CM | POA: Diagnosis not present

## 2011-09-29 DIAGNOSIS — C3410 Malignant neoplasm of upper lobe, unspecified bronchus or lung: Secondary | ICD-10-CM | POA: Diagnosis not present

## 2011-09-30 DIAGNOSIS — I2510 Atherosclerotic heart disease of native coronary artery without angina pectoris: Secondary | ICD-10-CM | POA: Diagnosis not present

## 2011-09-30 DIAGNOSIS — I10 Essential (primary) hypertension: Secondary | ICD-10-CM | POA: Diagnosis not present

## 2011-09-30 DIAGNOSIS — E785 Hyperlipidemia, unspecified: Secondary | ICD-10-CM | POA: Diagnosis not present

## 2011-09-30 DIAGNOSIS — Z510 Encounter for antineoplastic radiation therapy: Secondary | ICD-10-CM | POA: Diagnosis not present

## 2011-09-30 DIAGNOSIS — Z87891 Personal history of nicotine dependence: Secondary | ICD-10-CM | POA: Diagnosis not present

## 2011-09-30 DIAGNOSIS — Z951 Presence of aortocoronary bypass graft: Secondary | ICD-10-CM | POA: Diagnosis not present

## 2011-09-30 DIAGNOSIS — C3490 Malignant neoplasm of unspecified part of unspecified bronchus or lung: Secondary | ICD-10-CM | POA: Diagnosis not present

## 2011-09-30 DIAGNOSIS — C3410 Malignant neoplasm of upper lobe, unspecified bronchus or lung: Secondary | ICD-10-CM | POA: Diagnosis not present

## 2011-10-01 DIAGNOSIS — R42 Dizziness and giddiness: Secondary | ICD-10-CM | POA: Diagnosis not present

## 2011-10-01 DIAGNOSIS — I2510 Atherosclerotic heart disease of native coronary artery without angina pectoris: Secondary | ICD-10-CM | POA: Diagnosis not present

## 2011-10-01 DIAGNOSIS — R509 Fever, unspecified: Secondary | ICD-10-CM | POA: Diagnosis not present

## 2011-10-01 DIAGNOSIS — Z951 Presence of aortocoronary bypass graft: Secondary | ICD-10-CM | POA: Diagnosis not present

## 2011-10-01 DIAGNOSIS — C3490 Malignant neoplasm of unspecified part of unspecified bronchus or lung: Secondary | ICD-10-CM | POA: Diagnosis not present

## 2011-10-01 DIAGNOSIS — Z87891 Personal history of nicotine dependence: Secondary | ICD-10-CM | POA: Diagnosis not present

## 2011-10-01 DIAGNOSIS — E785 Hyperlipidemia, unspecified: Secondary | ICD-10-CM | POA: Diagnosis not present

## 2011-10-01 DIAGNOSIS — I10 Essential (primary) hypertension: Secondary | ICD-10-CM | POA: Diagnosis not present

## 2011-10-01 DIAGNOSIS — C3410 Malignant neoplasm of upper lobe, unspecified bronchus or lung: Secondary | ICD-10-CM | POA: Diagnosis not present

## 2011-10-01 DIAGNOSIS — R0609 Other forms of dyspnea: Secondary | ICD-10-CM | POA: Diagnosis not present

## 2011-10-01 DIAGNOSIS — Z510 Encounter for antineoplastic radiation therapy: Secondary | ICD-10-CM | POA: Diagnosis not present

## 2011-10-02 DIAGNOSIS — C3490 Malignant neoplasm of unspecified part of unspecified bronchus or lung: Secondary | ICD-10-CM | POA: Diagnosis not present

## 2011-10-02 DIAGNOSIS — Z951 Presence of aortocoronary bypass graft: Secondary | ICD-10-CM | POA: Diagnosis not present

## 2011-10-02 DIAGNOSIS — E785 Hyperlipidemia, unspecified: Secondary | ICD-10-CM | POA: Diagnosis not present

## 2011-10-02 DIAGNOSIS — Z510 Encounter for antineoplastic radiation therapy: Secondary | ICD-10-CM | POA: Diagnosis not present

## 2011-10-02 DIAGNOSIS — I951 Orthostatic hypotension: Secondary | ICD-10-CM | POA: Diagnosis not present

## 2011-10-02 DIAGNOSIS — C3410 Malignant neoplasm of upper lobe, unspecified bronchus or lung: Secondary | ICD-10-CM | POA: Diagnosis not present

## 2011-10-02 DIAGNOSIS — I10 Essential (primary) hypertension: Secondary | ICD-10-CM | POA: Diagnosis not present

## 2011-10-02 DIAGNOSIS — Z87891 Personal history of nicotine dependence: Secondary | ICD-10-CM | POA: Diagnosis not present

## 2011-10-02 DIAGNOSIS — I2510 Atherosclerotic heart disease of native coronary artery without angina pectoris: Secondary | ICD-10-CM | POA: Diagnosis not present

## 2011-10-05 DIAGNOSIS — Z510 Encounter for antineoplastic radiation therapy: Secondary | ICD-10-CM | POA: Diagnosis not present

## 2011-10-05 DIAGNOSIS — C3410 Malignant neoplasm of upper lobe, unspecified bronchus or lung: Secondary | ICD-10-CM | POA: Diagnosis not present

## 2011-10-05 DIAGNOSIS — C3490 Malignant neoplasm of unspecified part of unspecified bronchus or lung: Secondary | ICD-10-CM | POA: Diagnosis not present

## 2011-10-06 DIAGNOSIS — C3410 Malignant neoplasm of upper lobe, unspecified bronchus or lung: Secondary | ICD-10-CM | POA: Diagnosis not present

## 2011-10-06 DIAGNOSIS — C3490 Malignant neoplasm of unspecified part of unspecified bronchus or lung: Secondary | ICD-10-CM | POA: Diagnosis not present

## 2011-10-06 DIAGNOSIS — Z510 Encounter for antineoplastic radiation therapy: Secondary | ICD-10-CM | POA: Diagnosis not present

## 2011-10-07 DIAGNOSIS — C3410 Malignant neoplasm of upper lobe, unspecified bronchus or lung: Secondary | ICD-10-CM | POA: Diagnosis not present

## 2011-10-07 DIAGNOSIS — C3490 Malignant neoplasm of unspecified part of unspecified bronchus or lung: Secondary | ICD-10-CM | POA: Diagnosis not present

## 2011-10-07 DIAGNOSIS — Z510 Encounter for antineoplastic radiation therapy: Secondary | ICD-10-CM | POA: Diagnosis not present

## 2011-10-08 DIAGNOSIS — Z510 Encounter for antineoplastic radiation therapy: Secondary | ICD-10-CM | POA: Diagnosis not present

## 2011-10-08 DIAGNOSIS — C3490 Malignant neoplasm of unspecified part of unspecified bronchus or lung: Secondary | ICD-10-CM | POA: Diagnosis not present

## 2011-11-17 DIAGNOSIS — J449 Chronic obstructive pulmonary disease, unspecified: Secondary | ICD-10-CM | POA: Diagnosis not present

## 2011-11-23 DIAGNOSIS — L570 Actinic keratosis: Secondary | ICD-10-CM | POA: Diagnosis not present

## 2011-12-21 DIAGNOSIS — J449 Chronic obstructive pulmonary disease, unspecified: Secondary | ICD-10-CM | POA: Diagnosis not present

## 2011-12-21 DIAGNOSIS — J4489 Other specified chronic obstructive pulmonary disease: Secondary | ICD-10-CM | POA: Diagnosis not present

## 2011-12-21 DIAGNOSIS — J301 Allergic rhinitis due to pollen: Secondary | ICD-10-CM | POA: Diagnosis not present

## 2012-01-06 DIAGNOSIS — C3490 Malignant neoplasm of unspecified part of unspecified bronchus or lung: Secondary | ICD-10-CM | POA: Diagnosis not present

## 2012-01-06 DIAGNOSIS — C3412 Malignant neoplasm of upper lobe, left bronchus or lung: Secondary | ICD-10-CM | POA: Insufficient documentation

## 2012-01-19 DIAGNOSIS — M7750 Other enthesopathy of unspecified foot: Secondary | ICD-10-CM | POA: Diagnosis not present

## 2012-01-19 DIAGNOSIS — M216X9 Other acquired deformities of unspecified foot: Secondary | ICD-10-CM | POA: Diagnosis not present

## 2012-01-19 DIAGNOSIS — M79609 Pain in unspecified limb: Secondary | ICD-10-CM | POA: Diagnosis not present

## 2012-01-19 DIAGNOSIS — B351 Tinea unguium: Secondary | ICD-10-CM | POA: Diagnosis not present

## 2012-01-25 DIAGNOSIS — J449 Chronic obstructive pulmonary disease, unspecified: Secondary | ICD-10-CM | POA: Diagnosis not present

## 2012-01-25 DIAGNOSIS — J069 Acute upper respiratory infection, unspecified: Secondary | ICD-10-CM | POA: Diagnosis not present

## 2012-02-03 DIAGNOSIS — Z23 Encounter for immunization: Secondary | ICD-10-CM | POA: Diagnosis not present

## 2012-02-11 ENCOUNTER — Other Ambulatory Visit: Payer: Self-pay | Admitting: Family Medicine

## 2012-02-11 ENCOUNTER — Ambulatory Visit (HOSPITAL_COMMUNITY)
Admission: RE | Admit: 2012-02-11 | Discharge: 2012-02-11 | Disposition: A | Discharge status: Home / Self Care | Payer: Medicare Other | Source: Ambulatory Visit | Attending: Family Medicine | Admitting: Family Medicine

## 2012-02-11 DIAGNOSIS — J4489 Other specified chronic obstructive pulmonary disease: Secondary | ICD-10-CM | POA: Insufficient documentation

## 2012-02-11 DIAGNOSIS — R5381 Other malaise: Secondary | ICD-10-CM | POA: Diagnosis not present

## 2012-02-11 DIAGNOSIS — R0609 Other forms of dyspnea: Secondary | ICD-10-CM | POA: Diagnosis not present

## 2012-02-11 DIAGNOSIS — R0989 Other specified symptoms and signs involving the circulatory and respiratory systems: Secondary | ICD-10-CM | POA: Insufficient documentation

## 2012-02-11 DIAGNOSIS — Z79899 Other long term (current) drug therapy: Secondary | ICD-10-CM | POA: Diagnosis not present

## 2012-02-11 DIAGNOSIS — Z85118 Personal history of other malignant neoplasm of bronchus and lung: Secondary | ICD-10-CM | POA: Diagnosis not present

## 2012-02-11 DIAGNOSIS — R0600 Dyspnea, unspecified: Secondary | ICD-10-CM

## 2012-02-11 DIAGNOSIS — G252 Other specified forms of tremor: Secondary | ICD-10-CM | POA: Diagnosis not present

## 2012-02-11 DIAGNOSIS — J449 Chronic obstructive pulmonary disease, unspecified: Secondary | ICD-10-CM | POA: Diagnosis not present

## 2012-02-11 DIAGNOSIS — C3490 Malignant neoplasm of unspecified part of unspecified bronchus or lung: Secondary | ICD-10-CM | POA: Diagnosis not present

## 2012-03-07 DIAGNOSIS — J449 Chronic obstructive pulmonary disease, unspecified: Secondary | ICD-10-CM | POA: Diagnosis not present

## 2012-04-12 DIAGNOSIS — Q828 Other specified congenital malformations of skin: Secondary | ICD-10-CM | POA: Diagnosis not present

## 2012-04-21 DIAGNOSIS — Z79899 Other long term (current) drug therapy: Secondary | ICD-10-CM | POA: Diagnosis not present

## 2012-04-21 DIAGNOSIS — C3490 Malignant neoplasm of unspecified part of unspecified bronchus or lung: Secondary | ICD-10-CM | POA: Diagnosis not present

## 2012-04-21 DIAGNOSIS — C3410 Malignant neoplasm of upper lobe, unspecified bronchus or lung: Secondary | ICD-10-CM | POA: Diagnosis not present

## 2012-04-21 DIAGNOSIS — R918 Other nonspecific abnormal finding of lung field: Secondary | ICD-10-CM | POA: Diagnosis not present

## 2012-05-20 DIAGNOSIS — H02059 Trichiasis without entropian unspecified eye, unspecified eyelid: Secondary | ICD-10-CM | POA: Diagnosis not present

## 2012-05-27 ENCOUNTER — Encounter (HOSPITAL_COMMUNITY): Payer: Self-pay | Admitting: Emergency Medicine

## 2012-05-27 ENCOUNTER — Other Ambulatory Visit: Payer: Self-pay

## 2012-05-27 ENCOUNTER — Emergency Department (HOSPITAL_COMMUNITY)
Admission: EM | Admit: 2012-05-27 | Discharge: 2012-05-27 | Disposition: A | Discharge status: Home / Self Care | Payer: Medicare Other | Attending: Emergency Medicine | Admitting: Emergency Medicine

## 2012-05-27 ENCOUNTER — Emergency Department (HOSPITAL_COMMUNITY): Payer: Medicare Other

## 2012-05-27 DIAGNOSIS — Z951 Presence of aortocoronary bypass graft: Secondary | ICD-10-CM | POA: Insufficient documentation

## 2012-05-27 DIAGNOSIS — I129 Hypertensive chronic kidney disease with stage 1 through stage 4 chronic kidney disease, or unspecified chronic kidney disease: Secondary | ICD-10-CM | POA: Diagnosis not present

## 2012-05-27 DIAGNOSIS — IMO0002 Reserved for concepts with insufficient information to code with codable children: Secondary | ICD-10-CM | POA: Diagnosis not present

## 2012-05-27 DIAGNOSIS — R059 Cough, unspecified: Secondary | ICD-10-CM | POA: Insufficient documentation

## 2012-05-27 DIAGNOSIS — Z8679 Personal history of other diseases of the circulatory system: Secondary | ICD-10-CM | POA: Diagnosis not present

## 2012-05-27 DIAGNOSIS — R509 Fever, unspecified: Secondary | ICD-10-CM | POA: Diagnosis not present

## 2012-05-27 DIAGNOSIS — E785 Hyperlipidemia, unspecified: Secondary | ICD-10-CM | POA: Diagnosis not present

## 2012-05-27 DIAGNOSIS — Z8739 Personal history of other diseases of the musculoskeletal system and connective tissue: Secondary | ICD-10-CM | POA: Diagnosis not present

## 2012-05-27 DIAGNOSIS — Z862 Personal history of diseases of the blood and blood-forming organs and certain disorders involving the immune mechanism: Secondary | ICD-10-CM | POA: Insufficient documentation

## 2012-05-27 DIAGNOSIS — R05 Cough: Secondary | ICD-10-CM | POA: Insufficient documentation

## 2012-05-27 DIAGNOSIS — J189 Pneumonia, unspecified organism: Secondary | ICD-10-CM | POA: Diagnosis not present

## 2012-05-27 DIAGNOSIS — Z79899 Other long term (current) drug therapy: Secondary | ICD-10-CM | POA: Diagnosis not present

## 2012-05-27 DIAGNOSIS — N182 Chronic kidney disease, stage 2 (mild): Secondary | ICD-10-CM | POA: Insufficient documentation

## 2012-05-27 DIAGNOSIS — Z7982 Long term (current) use of aspirin: Secondary | ICD-10-CM | POA: Insufficient documentation

## 2012-05-27 DIAGNOSIS — J45901 Unspecified asthma with (acute) exacerbation: Secondary | ICD-10-CM | POA: Insufficient documentation

## 2012-05-27 DIAGNOSIS — C3490 Malignant neoplasm of unspecified part of unspecified bronchus or lung: Secondary | ICD-10-CM | POA: Insufficient documentation

## 2012-05-27 DIAGNOSIS — J441 Chronic obstructive pulmonary disease with (acute) exacerbation: Secondary | ICD-10-CM | POA: Insufficient documentation

## 2012-05-27 DIAGNOSIS — Z85828 Personal history of other malignant neoplasm of skin: Secondary | ICD-10-CM | POA: Insufficient documentation

## 2012-05-27 DIAGNOSIS — Z87891 Personal history of nicotine dependence: Secondary | ICD-10-CM | POA: Diagnosis not present

## 2012-05-27 DIAGNOSIS — J029 Acute pharyngitis, unspecified: Secondary | ICD-10-CM | POA: Diagnosis not present

## 2012-05-27 DIAGNOSIS — R0602 Shortness of breath: Secondary | ICD-10-CM | POA: Diagnosis not present

## 2012-05-27 LAB — CBC WITH DIFFERENTIAL/PLATELET
Basophils Relative: 0 % (ref 0–1)
Eosinophils Absolute: 0 10*3/uL (ref 0.0–0.7)
Eosinophils Relative: 0 % (ref 0–5)
HCT: 33.7 % — ABNORMAL LOW (ref 39.0–52.0)
Hemoglobin: 11.1 g/dL — ABNORMAL LOW (ref 13.0–17.0)
MCH: 31.4 pg (ref 26.0–34.0)
MCHC: 32.9 g/dL (ref 30.0–36.0)
MCV: 95.2 fL (ref 78.0–100.0)
Monocytes Absolute: 1.8 10*3/uL — ABNORMAL HIGH (ref 0.1–1.0)
Monocytes Relative: 13 % — ABNORMAL HIGH (ref 3–12)
Neutro Abs: 10.9 10*3/uL — ABNORMAL HIGH (ref 1.7–7.7)
RDW: 15 % (ref 11.5–15.5)

## 2012-05-27 LAB — BASIC METABOLIC PANEL
BUN: 28 mg/dL — ABNORMAL HIGH (ref 6–23)
Calcium: 9.4 mg/dL (ref 8.4–10.5)
Chloride: 103 mEq/L (ref 96–112)
Creatinine, Ser: 1.88 mg/dL — ABNORMAL HIGH (ref 0.50–1.35)
GFR calc Af Amer: 35 mL/min — ABNORMAL LOW (ref >90)

## 2012-05-27 MED ORDER — IPRATROPIUM BROMIDE 0.02 % IN SOLN
0.5000 mg | Freq: Once | RESPIRATORY_TRACT | Status: AC
  Administered 2012-05-27: 0.5 mg via RESPIRATORY_TRACT
  Filled 2012-05-27: qty 2.5

## 2012-05-27 MED ORDER — PREDNISONE 20 MG PO TABS: ORAL_TABLET | ORAL | Status: DC

## 2012-05-27 MED ORDER — ALBUTEROL SULFATE (5 MG/ML) 0.5% IN NEBU
5.0000 mg | INHALATION_SOLUTION | Freq: Once | RESPIRATORY_TRACT | Status: AC
  Administered 2012-05-27: 5 mg via RESPIRATORY_TRACT
  Filled 2012-05-27: qty 1

## 2012-05-27 MED ORDER — CEPHALEXIN 500 MG PO CAPS: ORAL_CAPSULE | ORAL | Status: DC

## 2012-05-27 MED ORDER — PREDNISONE 50 MG PO TABS
60.0000 mg | ORAL_TABLET | Freq: Once | ORAL | Status: AC
  Administered 2012-05-27: 60 mg via ORAL
  Filled 2012-05-27: qty 1

## 2012-05-27 MED ORDER — AZITHROMYCIN 250 MG PO TABS: ORAL_TABLET | ORAL | Status: DC

## 2012-05-27 NOTE — ED Provider Notes (Signed)
History     CSN: [id number]  Arrival date & time 05/27/12  1478   First MD Initiated Contact with Patient 05/27/12 1014      Chief Complaint  Patient presents with  . Fever  . Sore Throat    (Consider location/radiation/quality/duration/timing/severity/associated sxs/prior treatment) HPI This 79 year old male has a history of COPD, he has lung cancer with a history of radiation several months ago, he has not had chemotherapy in the past, he has not had recent hospitalizations in the last few months at all, he complains of one-week history of cough with occasional wheezing and shortness of breath improving with his albuterol inhaler, he also fever to 101 intermittently in the last week, he denies sore throat even though that is on the chief complaint list he states he just has trouble clearing his throat when he coughs, he is no drooling no stridor no trismus the voice change, is no chest pain abdominal pain vomiting rash or confusion. Shortness breath is mild. He does not want to be hospitalized if possible. He just wants to see if he has pneumonia. Past Medical History  Diagnosis Date  . ASCVD (arteriosclerotic cardiovascular disease)      CABG in 04/1993; and negative stress nuclear study in 08/2001  . Hyperlipidemia   . Syncope   . Hypertension   . Tobacco abuse, in remission     40 pack years; discontinued in 1980  . Cerebrovascular disease     Right carotid bruit-40-69% left internal carotid artery stenosis in 4/06; followed [facility]  . Chronic kidney disease, stage II (mild)     Creatinine-1.6 in 09/2008  . Degenerative joint disease of knee, left   . Normocytic anemia   . Pulmonary disease   . Cancer     skin  . Lung cancer 2013  . Prediabetes     Past Surgical History  Procedure Laterality Date  . Coronary artery bypass graft  1995  . Colonoscopy w/ polypectomy  1985  . Pilonidal cyst excision  1948  . Lesion excision    . Cardiac surgery      Family History  Problem  Relation Age of Onset  . Cancer Brother   . Heart attack Brother     History  Substance Use Topics  . Smoking status: Former Smoker -- 1.00 packs/day for 40 years    Types: Cigarettes    Quit date: 08/14/1973  . Smokeless tobacco: Former Neurosurgeon    Types: Chew    Quit date: 12/05/1980  . Alcohol Use: No      Review of Systems 10 Systems reviewed and are negative for acute change except as noted in the HPI. Allergies  Beta adrenergic blockers; Levaquin; Penicillins; and Sulfonamide derivatives  Home Medications   Current Outpatient Rx  Name  Route  Sig  Dispense  Refill  . albuterol (PROVENTIL HFA;VENTOLIN HFA) 108 (90 BASE) MCG/ACT inhaler   Inhalation   Inhale 2 puffs into the lungs 4 (four) times daily.         Marland Kitchen aspirin EC 81 MG tablet   Oral   Take 81 mg by mouth daily.           Marland Kitchen atorvastatin (LIPITOR) 40 MG tablet   Oral   Take 1 tablet (40 mg total) by mouth daily.         . budesonide-formoterol (SYMBICORT) 160-4.5 MCG/ACT inhaler   Inhalation   Inhale 2 puffs into the lungs 2 (two) times daily.           Marland Kitchen  diltiazem (CARDIZEM CD) 180 MG 24 hr capsule   Oral   Take 180 mg by mouth daily.           . fluticasone (FLONASE) 50 MCG/ACT nasal spray   Nasal   Place 2 sprays into the nose daily.           . metoprolol (LOPRESSOR) 50 MG tablet   Oral   Take 50 mg by mouth daily.          . Multiple Vitamin (MULTIVITAMIN WITH MINERALS) TABS   Oral   Take 1 tablet by mouth daily.         . potassium chloride (K-DUR) 10 MEQ tablet   Oral   Take 10 mEq by mouth 2 (two) times daily.         Marland Kitchen terazosin (HYTRIN) 5 MG capsule   Oral   Take 5 mg by mouth at bedtime.           Marland Kitchen tetrahydrozoline-zinc (VISINE-AC) 0.05-0.25 % ophthalmic solution   Both Eyes   Place 1 drop into both eyes daily.           Marland Kitchen tiotropium (SPIRIVA) 18 MCG inhalation capsule   Inhalation   Place 18 mcg into inhaler and inhale daily.         Marland Kitchen torsemide  (DEMADEX) 20 MG tablet   Oral   Take 20 mg by mouth daily.         Marland Kitchen azithromycin (ZITHROMAX Z-PAK) 250 MG tablet      2 po day one, then 1 daily x 4 days   5 tablet   0   . cephALEXin (KEFLEX) 500 MG capsule      2 caps po bid x 7 days   28 capsule   0   . nitroGLYCERIN (NITROSTAT) 0.4 MG SL tablet   Sublingual   Place 1 tablet (0.4 mg total) under the tongue every 5 (five) minutes as needed for chest pain.   25 tablet   2   . predniSONE (DELTASONE) 20 MG tablet      2 tabs po daily x 4 days   8 tablet   0     SpO2 94%  Physical Exam  Nursing note and vitals reviewed. Constitutional:  Awake, alert, nontoxic appearance.  HENT:  Head: Atraumatic.  Eyes: Right eye exhibits no discharge. Left eye exhibits no discharge.  Neck: Neck supple.  Cardiovascular: Normal rate and regular rhythm.   No murmur heard. Pulmonary/Chest: He is in respiratory distress. He has wheezes. He has no rales. He exhibits no tenderness.  He has minimal respiratory distress with diffuse bilateral wheezes and rhonchi with no crackles no retractions no sister muscle usage, he is able to speak full sentences with minimal to mild respiratory distress upon arrival with normal room-air pulse oximetry 94%.  Abdominal: Soft. There is no tenderness. There is no rebound.  Musculoskeletal: He exhibits no edema and no tenderness.  Baseline ROM, no obvious new focal weakness.  Neurological:  Mental status and motor strength appears baseline for patient and situation.  Skin: No rash noted.  Psychiatric: He has a normal mood and affect.    ED Course  Procedures (including critical care time) ECG: Normal sinus rhythm, showed 72, normal axis, normal intervals, no acute ischemic changes noted, impression normal ECG, no significant change noted compared with August 2012  Pt feels improved after observation and/or treatment in ED. Recheck lungs show minimal expiratory wheezes, no retractions no excess or  muscle usage, speech is normal, pulse oximetry is normal in her 94%. Chest x-ray reviewed with family and patient and I believe the patient is stable for outpatient followup he does not want additional this time, I believe outpatient CT scan to rule out recurrent cancer is reasonable as does the patient and family. Patient has a history of chronic renal insufficiency. Labs Reviewed  CBC WITH DIFFERENTIAL - Abnormal; Notable for the following:    WBC 14.2 (*)    RBC 3.54 (*)    Hemoglobin 11.1 (*)    HCT 33.7 (*)    Neutro Abs 10.9 (*)    Lymphocytes Relative 11 (*)    Monocytes Relative 13 (*)    Monocytes Absolute 1.8 (*)    All other components within normal limits  BASIC METABOLIC PANEL - Abnormal; Notable for the following:    Glucose, Bld 129 (*)    BUN 28 (*)    Creatinine, Ser 1.88 (*)    GFR calc non Af Amer 30 (*)    GFR calc Af Amer 35 (*)    All other components within normal limits  LACTIC ACID, PLASMA  TROPONIN I   No results found.   1. Community acquired pneumonia   2. Lung cancer   3. COPD exacerbation       MDM  Patient / Family / Caregiver informed of clinical course, understand medical decision-making process, and agree with plan.    I doubt any other EMC precluding discharge at this time including, but not necessarily limited to the following:sepsis.        Hurman Horn, MD 06/06/12 [phone]

## 2012-05-27 NOTE — ED Notes (Signed)
Patient with no complaints at this time. Respirations even and unlabored. Skin warm/dry. Discharge instructions reviewed with patient at this time. Patient given opportunity to voice concerns/ask questions. Patient discharged at this time and left Emergency Department with steady gait.   

## 2012-05-27 NOTE — ED Notes (Signed)
Pt has history of cancer. Here for sore throat, drainage and fever.

## 2012-05-30 DIAGNOSIS — J189 Pneumonia, unspecified organism: Secondary | ICD-10-CM | POA: Diagnosis not present

## 2012-06-01 DIAGNOSIS — Q828 Other specified congenital malformations of skin: Secondary | ICD-10-CM | POA: Diagnosis not present

## 2012-06-20 ENCOUNTER — Other Ambulatory Visit: Payer: Self-pay | Admitting: Family Medicine

## 2012-06-20 ENCOUNTER — Ambulatory Visit (HOSPITAL_COMMUNITY)
Admission: RE | Admit: 2012-06-20 | Discharge: 2012-06-20 | Disposition: A | Discharge status: Home / Self Care | Payer: Medicare Other | Source: Ambulatory Visit | Attending: Family Medicine | Admitting: Family Medicine

## 2012-06-20 DIAGNOSIS — J189 Pneumonia, unspecified organism: Secondary | ICD-10-CM | POA: Diagnosis not present

## 2012-06-20 DIAGNOSIS — Z85118 Personal history of other malignant neoplasm of bronchus and lung: Secondary | ICD-10-CM | POA: Insufficient documentation

## 2012-06-20 DIAGNOSIS — Z09 Encounter for follow-up examination after completed treatment for conditions other than malignant neoplasm: Secondary | ICD-10-CM | POA: Diagnosis not present

## 2012-06-27 DIAGNOSIS — M7981 Nontraumatic hematoma of soft tissue: Secondary | ICD-10-CM | POA: Diagnosis not present

## 2012-06-27 DIAGNOSIS — H02059 Trichiasis without entropian unspecified eye, unspecified eyelid: Secondary | ICD-10-CM | POA: Diagnosis not present

## 2012-06-27 DIAGNOSIS — J449 Chronic obstructive pulmonary disease, unspecified: Secondary | ICD-10-CM | POA: Diagnosis not present

## 2012-07-02 ENCOUNTER — Other Ambulatory Visit: Payer: Self-pay | Admitting: *Deleted

## 2012-07-02 MED ORDER — ALBUTEROL SULFATE HFA 108 (90 BASE) MCG/ACT IN AERS: 2.0000 | INHALATION_SPRAY | RESPIRATORY_TRACT | Status: DC | PRN

## 2012-07-13 DIAGNOSIS — D235 Other benign neoplasm of skin of trunk: Secondary | ICD-10-CM | POA: Diagnosis not present

## 2012-07-13 DIAGNOSIS — C44319 Basal cell carcinoma of skin of other parts of face: Secondary | ICD-10-CM | POA: Diagnosis not present

## 2012-07-13 DIAGNOSIS — L570 Actinic keratosis: Secondary | ICD-10-CM | POA: Diagnosis not present

## 2012-07-28 DIAGNOSIS — C44319 Basal cell carcinoma of skin of other parts of face: Secondary | ICD-10-CM | POA: Diagnosis not present

## 2012-08-10 ENCOUNTER — Encounter: Payer: Self-pay | Admitting: Family Medicine

## 2012-08-10 ENCOUNTER — Ambulatory Visit (INDEPENDENT_AMBULATORY_CARE_PROVIDER_SITE_OTHER): Payer: Medicare Other | Admitting: Family Medicine

## 2012-08-10 VITALS — BP 112/60 | Temp 97.8°F | Ht 70.0 in | Wt 173.5 lb

## 2012-08-10 DIAGNOSIS — C3490 Malignant neoplasm of unspecified part of unspecified bronchus or lung: Secondary | ICD-10-CM | POA: Diagnosis not present

## 2012-08-10 DIAGNOSIS — R042 Hemoptysis: Secondary | ICD-10-CM | POA: Insufficient documentation

## 2012-08-10 MED ORDER — AZITHROMYCIN 250 MG PO TABS: ORAL_TABLET | ORAL | Status: DC

## 2012-08-10 NOTE — Patient Instructions (Signed)
Small amounts should be ok but if large amounts go to ER asap  Use Z pack  I will speak with specialist then call you later this week.

## 2012-08-10 NOTE — Progress Notes (Addendum)
  Subjective:    Patient ID: [name], male    DOB: 1921/09/09, 79 y.o.   MRN: [id number]  HPIpatient relates intermittent hemoptysis there has been present over the past day. Several different episodes of coughing up clear phlegm with some blood in it. He has history of lung cancer he is being followed by [facility] specialty. The specialist there feels that this area has reduced in size and no evidence of progression he has been treated with radiation. I am not certain how much more treatment he can undergo. He denies fever chills sweats nausea vomiting or diarrhea. He is not smoking currently. No weight loss.    Review of Systemssee above.     Objective:   Physical Exam neck no masses eardrums are normal lungs are clear no crackles no cough noted heart is regular extremities trace edema      Assessment & Plan:  Hemoptysis-I am concerned about what is going on with this patient. He is under the impression that his tumor has gone but recent CT scan that was in January showed that he did in fact still have a tumor present. The fact that he is now having hemoptysis points in the direction of progression of this tumor. I called and spoke with the specialist at [facility]. They're concerned about what is going on with him. They are seen him in May 8. They have stated that it is necessary for him to get CT of the chest with contrast to look at this tumor. We will go ahead and set this up urgently. The patient was told that if he starts having significant hemoptysis or feeling like he is going to pass out he needs to go immediately to the ER.

## 2012-08-11 ENCOUNTER — Other Ambulatory Visit: Payer: Self-pay | Admitting: *Deleted

## 2012-08-11 DIAGNOSIS — R042 Hemoptysis: Secondary | ICD-10-CM

## 2012-08-11 NOTE — Progress Notes (Signed)
Spoke with Charolotte Eke (son) with pt permission to have CT chest with contrast May 6 at 2:30 be there at 2:15.  Aware to get BMP before date of CT, and to pick up CD rom after CT has been performed.  Pt and son also aware of [facility] specialist to call pt with time of app made for May 8.

## 2012-08-12 ENCOUNTER — Other Ambulatory Visit: Payer: Self-pay | Admitting: Family Medicine

## 2012-08-12 DIAGNOSIS — R042 Hemoptysis: Secondary | ICD-10-CM | POA: Diagnosis not present

## 2012-08-12 LAB — BASIC METABOLIC PANEL
BUN: 37 mg/dL — ABNORMAL HIGH (ref 6–23)
Calcium: 9 mg/dL (ref 8.4–10.5)
Creat: 2.67 mg/dL — ABNORMAL HIGH (ref 0.50–1.35)
Potassium: 4.3 mEq/L (ref 3.5–5.3)

## 2012-08-16 ENCOUNTER — Ambulatory Visit (HOSPITAL_COMMUNITY)
Admission: RE | Admit: 2012-08-16 | Discharge: 2012-08-16 | Disposition: A | Discharge status: Home / Self Care | Payer: Medicare Other | Source: Ambulatory Visit | Attending: Family Medicine | Admitting: Family Medicine

## 2012-08-16 ENCOUNTER — Other Ambulatory Visit: Payer: Self-pay | Admitting: *Deleted

## 2012-08-16 DIAGNOSIS — R042 Hemoptysis: Secondary | ICD-10-CM | POA: Insufficient documentation

## 2012-08-16 DIAGNOSIS — Z923 Personal history of irradiation: Secondary | ICD-10-CM | POA: Diagnosis not present

## 2012-08-16 DIAGNOSIS — C3490 Malignant neoplasm of unspecified part of unspecified bronchus or lung: Secondary | ICD-10-CM | POA: Diagnosis not present

## 2012-08-16 DIAGNOSIS — Z79899 Other long term (current) drug therapy: Secondary | ICD-10-CM

## 2012-08-17 ENCOUNTER — Encounter: Payer: Self-pay | Admitting: *Deleted

## 2012-08-18 DIAGNOSIS — C3490 Malignant neoplasm of unspecified part of unspecified bronchus or lung: Secondary | ICD-10-CM | POA: Diagnosis not present

## 2012-08-26 DIAGNOSIS — Q828 Other specified congenital malformations of skin: Secondary | ICD-10-CM | POA: Diagnosis not present

## 2012-08-30 DIAGNOSIS — R042 Hemoptysis: Secondary | ICD-10-CM | POA: Diagnosis not present

## 2012-08-30 LAB — BASIC METABOLIC PANEL
Potassium: 4.7 mEq/L (ref 3.5–5.3)
Sodium: 140 mEq/L (ref 135–145)

## 2012-09-07 DIAGNOSIS — C3490 Malignant neoplasm of unspecified part of unspecified bronchus or lung: Secondary | ICD-10-CM | POA: Diagnosis not present

## 2012-09-09 ENCOUNTER — Other Ambulatory Visit: Payer: Self-pay | Admitting: Family Medicine

## 2012-09-09 ENCOUNTER — Telehealth: Payer: Self-pay | Admitting: Family Medicine

## 2012-09-09 MED ORDER — TIOTROPIUM BROMIDE MONOHYDRATE 18 MCG IN CAPS: 18.0000 ug | ORAL_CAPSULE | Freq: Every day | RESPIRATORY_TRACT | Status: DC

## 2012-09-09 NOTE — Telephone Encounter (Signed)
Patient needs a refill of Spiriva to [facility] in [place]

## 2012-09-13 DIAGNOSIS — Z85828 Personal history of other malignant neoplasm of skin: Secondary | ICD-10-CM | POA: Diagnosis not present

## 2012-09-13 DIAGNOSIS — L570 Actinic keratosis: Secondary | ICD-10-CM | POA: Diagnosis not present

## 2012-09-17 ENCOUNTER — Other Ambulatory Visit: Payer: Self-pay | Admitting: Family Medicine

## 2012-09-24 ENCOUNTER — Other Ambulatory Visit: Payer: Self-pay | Admitting: Family Medicine

## 2012-09-26 DIAGNOSIS — R042 Hemoptysis: Secondary | ICD-10-CM | POA: Diagnosis not present

## 2012-09-26 DIAGNOSIS — C3490 Malignant neoplasm of unspecified part of unspecified bronchus or lung: Secondary | ICD-10-CM | POA: Diagnosis not present

## 2012-09-26 DIAGNOSIS — C3410 Malignant neoplasm of upper lobe, unspecified bronchus or lung: Secondary | ICD-10-CM | POA: Diagnosis not present

## 2012-10-06 ENCOUNTER — Ambulatory Visit (INDEPENDENT_AMBULATORY_CARE_PROVIDER_SITE_OTHER): Payer: Medicare Other | Admitting: Family Medicine

## 2012-10-06 ENCOUNTER — Encounter: Payer: Self-pay | Admitting: Family Medicine

## 2012-10-06 DIAGNOSIS — C3490 Malignant neoplasm of unspecified part of unspecified bronchus or lung: Secondary | ICD-10-CM | POA: Diagnosis not present

## 2012-10-06 DIAGNOSIS — H538 Other visual disturbances: Secondary | ICD-10-CM

## 2012-10-06 NOTE — Progress Notes (Signed)
  Subjective:    Patient ID: [name], male    DOB: 1921-06-05, 79 y.o.   MRN: [id number]  HPI Patient states that he has blurred vision in his left eye. He states that he has seen an eye doctor for this issue. It has been going on for about 3 months. This patient relates a serum blurred vision he thinks is more in his left thigh but when questioned more specifically it appears to be in both eyes. He describes it as generalized blurred vision. He denies any particular problems with headaches denies any unilateral numbness or weakness. He does have a history of lung cancer. He also has a history of benign essential tremor he is also seen the optometrist several times in the past week for eyelash that is bothering him. He also has a history of hemoptysis related to his lung cancer the last time he went to [facility] they did a biopsy he is waiting to hear the results. He has not had any significant bleeding since then. Family history noncontributory Past medical history was reviewed Review of systems denies any wheezing does get a little short of breath if he pushes himself denies chest pressure pain discomfort. Denies sweats chills.  Review of Systems     Objective:   Physical Exam Neck no masses eardrums normal throat is normal lungs are clear heart regular. He has constricted pupils has a history of cataracts EOMI good peripheral vision good patient has subjective blurred vision when he looks at objects but he is able to read print. Negative Romberg.       Assessment & Plan:  Blurred vision-I. doubt that this patient has had a stroke. I talked with him at length about things I even talked with his son. I also talked with the specialist Dr. Charise Killian optometry. We discussed the case. They will see him next week and then he will followup with me the following week if retina exam and eye exam was normal the next step would be is setting him up for a scan of his head to rule out stroke rule  out tumor he will followup sooner if any problems

## 2012-10-10 DIAGNOSIS — H52229 Regular astigmatism, unspecified eye: Secondary | ICD-10-CM | POA: Diagnosis not present

## 2012-10-10 DIAGNOSIS — H5200 Hypermetropia, unspecified eye: Secondary | ICD-10-CM | POA: Diagnosis not present

## 2012-10-10 DIAGNOSIS — H04129 Dry eye syndrome of unspecified lacrimal gland: Secondary | ICD-10-CM | POA: Diagnosis not present

## 2012-10-10 DIAGNOSIS — H524 Presbyopia: Secondary | ICD-10-CM | POA: Diagnosis not present

## 2012-10-13 ENCOUNTER — Ambulatory Visit (INDEPENDENT_AMBULATORY_CARE_PROVIDER_SITE_OTHER): Payer: Medicare Other | Admitting: Otolaryngology

## 2012-10-13 DIAGNOSIS — H6120 Impacted cerumen, unspecified ear: Secondary | ICD-10-CM | POA: Diagnosis not present

## 2012-10-13 DIAGNOSIS — R040 Epistaxis: Secondary | ICD-10-CM | POA: Diagnosis not present

## 2012-10-19 ENCOUNTER — Ambulatory Visit (INDEPENDENT_AMBULATORY_CARE_PROVIDER_SITE_OTHER): Payer: Medicare Other | Admitting: Family Medicine

## 2012-10-19 ENCOUNTER — Encounter: Payer: Self-pay | Admitting: Family Medicine

## 2012-10-19 VITALS — BP 122/60 | HR 70 | Wt 174.0 lb

## 2012-10-19 DIAGNOSIS — R042 Hemoptysis: Secondary | ICD-10-CM

## 2012-10-19 DIAGNOSIS — E785 Hyperlipidemia, unspecified: Secondary | ICD-10-CM

## 2012-10-19 DIAGNOSIS — I10 Essential (primary) hypertension: Secondary | ICD-10-CM | POA: Diagnosis not present

## 2012-10-19 NOTE — Progress Notes (Signed)
  Subjective:    Patient ID: [name], male    DOB: 14-Dec-1921, 79 y.o.   MRN: [id number]  HPIhere for a follow up on blurred vision. He saw Dr. Charise Killian and he states his vision is better now.  Patient relates that he is actually feeling much better compared where he was. Energy level doing better not coughing up any blood he was having problems with that biopsies done by to do Dr. were negative. He states he's had been overall improvement breathing doing fairly well despite his COPD. Also vision doing better  PMH COPD lung cancer in remission. Patient doesn't smoke.  Review of Systems See above.    Objective:   Physical Exam Lungs are clear, heart regular, pulse normal blood pressure good extremities trace edema       Assessment & Plan:  COPD-stable Lung cancer in remission, followup with [facility] in August Patient to followup this fall with [place] flu vaccine at that time Hyperlipidemia check labs before next visit orders were given.

## 2012-10-20 ENCOUNTER — Ambulatory Visit: Payer: Medicare Other | Admitting: Family Medicine

## 2012-10-21 ENCOUNTER — Telehealth: Payer: Self-pay | Admitting: Family Medicine

## 2012-10-21 ENCOUNTER — Ambulatory Visit (INDEPENDENT_AMBULATORY_CARE_PROVIDER_SITE_OTHER): Payer: Medicare Other | Admitting: Family Medicine

## 2012-10-21 ENCOUNTER — Encounter: Payer: Self-pay | Admitting: Family Medicine

## 2012-10-21 VITALS — BP 130/88 | HR 70

## 2012-10-21 DIAGNOSIS — G459 Transient cerebral ischemic attack, unspecified: Secondary | ICD-10-CM | POA: Diagnosis not present

## 2012-10-21 NOTE — Telephone Encounter (Signed)
Calling to let you know Dr. Olegario Messier number ... [phone]

## 2012-10-21 NOTE — Telephone Encounter (Signed)
Patient told to come to office now to be worked in by Dr. Lorin Picket.

## 2012-10-21 NOTE — Progress Notes (Signed)
  Subjective:    Patient ID: [name], male    DOB: 08-30-1921, 80 y.o.   MRN: [id number]  HPIFollow up on blurry vision in left eye. Patient relates he had episode this morning where the left side of his vision was blurry it lasted 5-6 minutes then it got better he saw the eye doctor who did a thorough evaluation of his retina it appears normal he denies headaches he denies loss of vision denies unilateral numbness or weakness. Patient does have a history of lung cancer he also has a history of cardiovascular disease. Patient does not smoke. FMH benign social lives at home with family   Review of Systems See above    Objective:   Physical Exam Visual fields are normal pupils responsive to light eardrums normal neck supple no bruits lungs are clear hearts regular patient has tremor nose sign he unilateral weakness       Assessment & Plan:  #1 possibility of TIA versus a possibility of metastatic lesion we will go forward with ordering MRI. Also needs carotid ultrasound studies. Discussion held with patient along with his son as well as called the specialist at [facility] talk with them then called the patient back significant time spent with the patient today as well as followup phone call [phone] minutes total time involved  I did inform the patient his son that [name] may restart baby aspirin 1 daily. In addition to this if he had any trouble with hemoptysis he will stop the baby aspirin. Also when we get the results of the MRI and a carotid ultrasound we will share it with his specialist at [facility]. He has an appointment with the specialist at [facility] in August and he is to followup with [place] in 2 weeks' time.

## 2012-10-21 NOTE — Telephone Encounter (Signed)
Still blurred? If so have pt come now.

## 2012-10-21 NOTE — Telephone Encounter (Signed)
Pt states that he had the blurred vision in his left eye again this morning.  He went to see his eye DR an was told everything was fine with his eyes from his stand point.  Pt wants to know if you would like to go forward with the MRI?

## 2012-10-23 NOTE — Telephone Encounter (Signed)
I spoke with specialist. I spoke with family. Per specialist restart baby asa, if bleeding/hemoptysis then stop. Keep f/u ov  With specialist. To do mri coming up as planned ( to r/o stroke and metastatic tumor. )

## 2012-10-26 ENCOUNTER — Ambulatory Visit (HOSPITAL_COMMUNITY)
Admission: RE | Admit: 2012-10-26 | Discharge: 2012-10-26 | Disposition: A | Discharge status: Home / Self Care | Payer: Medicare Other | Source: Ambulatory Visit | Attending: Family Medicine | Admitting: Family Medicine

## 2012-10-26 DIAGNOSIS — Z87891 Personal history of nicotine dependence: Secondary | ICD-10-CM | POA: Diagnosis not present

## 2012-10-26 DIAGNOSIS — H538 Other visual disturbances: Secondary | ICD-10-CM | POA: Insufficient documentation

## 2012-10-26 DIAGNOSIS — G459 Transient cerebral ischemic attack, unspecified: Secondary | ICD-10-CM

## 2012-10-26 DIAGNOSIS — C3490 Malignant neoplasm of unspecified part of unspecified bronchus or lung: Secondary | ICD-10-CM | POA: Insufficient documentation

## 2012-10-26 DIAGNOSIS — Z951 Presence of aortocoronary bypass graft: Secondary | ICD-10-CM | POA: Diagnosis not present

## 2012-10-26 DIAGNOSIS — I10 Essential (primary) hypertension: Secondary | ICD-10-CM | POA: Insufficient documentation

## 2012-10-26 DIAGNOSIS — I6529 Occlusion and stenosis of unspecified carotid artery: Secondary | ICD-10-CM | POA: Diagnosis not present

## 2012-10-26 DIAGNOSIS — I658 Occlusion and stenosis of other precerebral arteries: Secondary | ICD-10-CM | POA: Diagnosis not present

## 2012-11-03 ENCOUNTER — Ambulatory Visit (INDEPENDENT_AMBULATORY_CARE_PROVIDER_SITE_OTHER): Payer: Medicare Other | Admitting: Family Medicine

## 2012-11-03 ENCOUNTER — Encounter: Payer: Self-pay | Admitting: Family Medicine

## 2012-11-03 ENCOUNTER — Ambulatory Visit: Payer: Medicare Other | Admitting: Family Medicine

## 2012-11-03 VITALS — BP 116/82 | Wt 173.6 lb

## 2012-11-03 DIAGNOSIS — E785 Hyperlipidemia, unspecified: Secondary | ICD-10-CM

## 2012-11-03 DIAGNOSIS — R7309 Other abnormal glucose: Secondary | ICD-10-CM | POA: Diagnosis not present

## 2012-11-03 DIAGNOSIS — Z79899 Other long term (current) drug therapy: Secondary | ICD-10-CM

## 2012-11-03 DIAGNOSIS — G459 Transient cerebral ischemic attack, unspecified: Secondary | ICD-10-CM

## 2012-11-03 DIAGNOSIS — I10 Essential (primary) hypertension: Secondary | ICD-10-CM

## 2012-11-03 DIAGNOSIS — R7303 Prediabetes: Secondary | ICD-10-CM

## 2012-11-03 MED ORDER — ASPIRIN 81 MG PO CHEW: 81.0000 mg | CHEWABLE_TABLET | Freq: Every day | ORAL | Status: AC

## 2012-11-03 NOTE — Progress Notes (Signed)
  Subjective:    Patient ID: [name], male    DOB: Aug 28, 1921, 79 y.o.   MRN: [id number]  HPI  Patient arrives to follow up on TIA. Patient denies any chest tightness he is having some coughing not having any hemoptysis. Denies any chest pain. He denies any TIA symptoms. No further visual problems. Tolerating baby aspirin well. Sees lung cancer specialist in August. PMH lung cancer, COPD, recent TIA events Review of Systems See above. No swelling. No hematochezia no hemoptysis. Denies chest pain his breathing is stable    Objective:   Physical Exam Lungs are clear no crackles heart is regular no murmurs pulse is normal blood pressure good extremities no edema skin warm dry tremor noted       Assessment & Plan:  Benign essential tremor stable Lung cancer no return a hemoptysis see specialists in August TIA no further ones. Results of MRI and carotid was reviewed. No need for any type of surgery. Aspirin baby aspirin daily. Followup patient 3 months.

## 2012-11-09 DIAGNOSIS — R7309 Other abnormal glucose: Secondary | ICD-10-CM | POA: Diagnosis not present

## 2012-11-09 DIAGNOSIS — Z79899 Other long term (current) drug therapy: Secondary | ICD-10-CM | POA: Diagnosis not present

## 2012-11-09 DIAGNOSIS — E785 Hyperlipidemia, unspecified: Secondary | ICD-10-CM | POA: Diagnosis not present

## 2012-11-09 LAB — HEPATIC FUNCTION PANEL
ALT: 10 U/L (ref 0–53)
Total Protein: 6.1 g/dL (ref 6.0–8.3)

## 2012-11-09 LAB — LIPID PANEL
Cholesterol: 137 mg/dL (ref 0–200)
HDL: 60 mg/dL (ref >39)
Total CHOL/HDL Ratio: 2.3 Ratio
Triglycerides: 69 mg/dL (ref <150)
VLDL: 14 mg/dL (ref 0–40)

## 2012-11-09 LAB — HEMOGLOBIN A1C
Hgb A1c MFr Bld: 5.6 % (ref <5.7)
Mean Plasma Glucose: 114 mg/dL (ref <117)

## 2012-11-17 DIAGNOSIS — Z09 Encounter for follow-up examination after completed treatment for conditions other than malignant neoplasm: Secondary | ICD-10-CM | POA: Diagnosis not present

## 2012-11-17 DIAGNOSIS — Z85118 Personal history of other malignant neoplasm of bronchus and lung: Secondary | ICD-10-CM | POA: Diagnosis not present

## 2012-11-17 DIAGNOSIS — C3490 Malignant neoplasm of unspecified part of unspecified bronchus or lung: Secondary | ICD-10-CM | POA: Diagnosis not present

## 2012-11-17 DIAGNOSIS — R918 Other nonspecific abnormal finding of lung field: Secondary | ICD-10-CM | POA: Diagnosis not present

## 2012-11-18 DIAGNOSIS — Q828 Other specified congenital malformations of skin: Secondary | ICD-10-CM | POA: Diagnosis not present

## 2012-11-28 ENCOUNTER — Other Ambulatory Visit: Payer: Self-pay | Admitting: *Deleted

## 2012-11-28 MED ORDER — METOPROLOL TARTRATE 50 MG PO TABS: 50.0000 mg | ORAL_TABLET | Freq: Every day | ORAL | Status: DC

## 2012-11-28 MED ORDER — TERAZOSIN HCL 5 MG PO CAPS: 5.0000 mg | ORAL_CAPSULE | Freq: Every day | ORAL | Status: DC

## 2012-11-28 MED ORDER — TIOTROPIUM BROMIDE MONOHYDRATE 18 MCG IN CAPS: 18.0000 ug | ORAL_CAPSULE | Freq: Every day | RESPIRATORY_TRACT | Status: DC

## 2012-11-28 MED ORDER — DILTIAZEM HCL ER COATED BEADS 180 MG PO CP24: 180.0000 mg | ORAL_CAPSULE | Freq: Every day | ORAL | Status: DC

## 2012-11-28 MED ORDER — ATORVASTATIN CALCIUM 40 MG PO TABS: 40.0000 mg | ORAL_TABLET | Freq: Every day | ORAL | Status: DC

## 2012-12-05 DIAGNOSIS — L738 Other specified follicular disorders: Secondary | ICD-10-CM | POA: Diagnosis not present

## 2012-12-05 DIAGNOSIS — L570 Actinic keratosis: Secondary | ICD-10-CM | POA: Diagnosis not present

## 2012-12-21 ENCOUNTER — Other Ambulatory Visit (HOSPITAL_COMMUNITY): Payer: Self-pay | Admitting: Family Medicine

## 2013-01-10 ENCOUNTER — Ambulatory Visit (INDEPENDENT_AMBULATORY_CARE_PROVIDER_SITE_OTHER): Payer: Medicare Other | Admitting: Family Medicine

## 2013-01-10 ENCOUNTER — Encounter: Payer: Self-pay | Admitting: Family Medicine

## 2013-01-10 VITALS — BP 100/60 | Ht 65.5 in | Wt 175.1 lb

## 2013-01-10 DIAGNOSIS — J449 Chronic obstructive pulmonary disease, unspecified: Secondary | ICD-10-CM | POA: Diagnosis not present

## 2013-01-10 DIAGNOSIS — E785 Hyperlipidemia, unspecified: Secondary | ICD-10-CM

## 2013-01-10 DIAGNOSIS — J4489 Other specified chronic obstructive pulmonary disease: Secondary | ICD-10-CM

## 2013-01-10 DIAGNOSIS — Z23 Encounter for immunization: Secondary | ICD-10-CM

## 2013-01-10 DIAGNOSIS — I10 Essential (primary) hypertension: Secondary | ICD-10-CM | POA: Diagnosis not present

## 2013-01-10 NOTE — Progress Notes (Signed)
  Subjective:    Patient ID: [name], male    DOB: 05-19-1921, 79 y.o.   MRN: [id number]  Hyperlipidemia This is a chronic problem. The current episode started more than 1 year ago. The problem is controlled. Recent lipid tests were reviewed and are normal. There are no known factors aggravating his hyperlipidemia. He is currently on no antihyperlipidemic treatment. The current treatment provides moderate improvement of lipids. There are no compliance problems.  There are no known risk factors for coronary artery disease.  Patient has no concerns today.  Patient has history of COPD heart disease hypertension benign essential tremor. Patient does not smoke.  Review of Systems He does get little short of breath with activity he denies headache sweats chills nausea vomiting. Denies chest pressure tightness pain relates he eats healthy tries to stay physically active.    Objective:   Physical Exam  Vitals reviewed. Constitutional: He appears well-developed and well-nourished.  HENT:  Head: Normocephalic.  Right Ear: External ear normal.  Left Ear: External ear normal.  Cardiovascular: Normal rate, regular rhythm and normal heart sounds.   Pulmonary/Chest: Effort normal and breath sounds normal. No respiratory distress. He has no wheezes. He has no rales.  Musculoskeletal:  Benign essential tremor  Lymphadenopathy:    He has no cervical adenopathy.   Blood pressure on recheck was 120/62       Assessment & Plan:  #1 COPD-very stable. No sign of any type of major issues going on continue current measures #2 hyperlipidemia with recent lab work looked great followup 6 months Benign essential tremor stable. Followup 6 months

## 2013-02-02 ENCOUNTER — Other Ambulatory Visit: Payer: Self-pay | Admitting: Family Medicine

## 2013-02-06 DIAGNOSIS — H02059 Trichiasis without entropian unspecified eye, unspecified eyelid: Secondary | ICD-10-CM | POA: Diagnosis not present

## 2013-02-07 DIAGNOSIS — M79609 Pain in unspecified limb: Secondary | ICD-10-CM | POA: Diagnosis not present

## 2013-02-07 DIAGNOSIS — Q842 Other congenital malformations of hair: Secondary | ICD-10-CM | POA: Diagnosis not present

## 2013-02-18 ENCOUNTER — Other Ambulatory Visit: Payer: Self-pay | Admitting: Family Medicine

## 2013-03-16 ENCOUNTER — Ambulatory Visit (INDEPENDENT_AMBULATORY_CARE_PROVIDER_SITE_OTHER): Payer: Medicare Other | Admitting: Family Medicine

## 2013-03-16 ENCOUNTER — Encounter: Payer: Self-pay | Admitting: Family Medicine

## 2013-03-16 VITALS — BP 118/52 | Temp 98.4°F | Ht 67.0 in | Wt 178.2 lb

## 2013-03-16 DIAGNOSIS — R509 Fever, unspecified: Secondary | ICD-10-CM | POA: Diagnosis not present

## 2013-03-16 DIAGNOSIS — J209 Acute bronchitis, unspecified: Secondary | ICD-10-CM

## 2013-03-16 DIAGNOSIS — J449 Chronic obstructive pulmonary disease, unspecified: Secondary | ICD-10-CM | POA: Diagnosis not present

## 2013-03-16 MED ORDER — CEFTRIAXONE SODIUM 1 G IJ SOLR
500.0000 mg | Freq: Once | INTRAMUSCULAR | Status: AC
  Administered 2013-03-16: 500 mg via INTRAMUSCULAR

## 2013-03-16 MED ORDER — CEFPROZIL 500 MG PO TABS: 500.0000 mg | ORAL_TABLET | Freq: Two times a day (BID) | ORAL | Status: DC

## 2013-03-16 NOTE — Progress Notes (Signed)
   Subjective:    Patient ID: [name], male    DOB: [birth year], 79 y.o.   MRN: [id number]  Cough This is a new problem. The current episode started in the past 7 days. The problem occurs every few minutes. The cough is productive of sputum. Associated symptoms include rhinorrhea and shortness of breath. Nothing aggravates the symptoms. He has tried steroid inhaler and rest for the symptoms. The treatment provided mild relief.   Began a few days ago COPD/ Lung Cancer   Review of Systems  HENT: Positive for rhinorrhea.   Respiratory: Positive for cough and shortness of breath.        Objective:   Physical Exam  Lungs are clear cough is noted neck no masses extremities no edema tremor noted      Assessment & Plan:  Bronchitis doubt pneumonia antibiotics prescribed warning signs discussed no x-ray or lab work necessary. Rocephin given.

## 2013-04-21 DIAGNOSIS — H02059 Trichiasis without entropian unspecified eye, unspecified eyelid: Secondary | ICD-10-CM | POA: Diagnosis not present

## 2013-05-05 ENCOUNTER — Ambulatory Visit (INDEPENDENT_AMBULATORY_CARE_PROVIDER_SITE_OTHER): Payer: Medicare Other | Admitting: Family Medicine

## 2013-05-05 ENCOUNTER — Encounter: Payer: Self-pay | Admitting: Family Medicine

## 2013-05-05 VITALS — BP 102/60 | Temp 98.3°F | Ht 67.0 in | Wt 170.4 lb

## 2013-05-05 DIAGNOSIS — K117 Disturbances of salivary secretion: Secondary | ICD-10-CM | POA: Diagnosis not present

## 2013-05-05 DIAGNOSIS — R682 Dry mouth, unspecified: Secondary | ICD-10-CM

## 2013-05-05 DIAGNOSIS — J449 Chronic obstructive pulmonary disease, unspecified: Secondary | ICD-10-CM

## 2013-05-05 NOTE — Progress Notes (Signed)
   Subjective:    Patient ID: [name], male    DOB: 02/06/1922, 80 y.o.   MRN: [id number]  HPI Patient states he is here today because he has been experiencing dry throat/mouth and thick saliva for about 2-3 weeks now. States he believes it may be a side effect of the spiriva he takes daily.  He relates thick mucus sometimes clear sometimes with a brownish 10 he will be seeing the specialist in a few weeks as a followup to his lung cancer. PMH benign  Review of Systems Denies fevers chills sweats denies vomiting diarrhea.    Objective:   Physical Exam  Lungs clear no wheezes heart is regular pulse normal extremities no edema skin warm dry neurologic grossly normal patient does have tremors      Assessment & Plan:  #1 COPD with no current exacerbation #2 dry mouth as a secondary effect to Spiriva he will stop this medicine for the time being he is already on Symbicort what seems to be doing well  He was advised to use a humidifier if he starts running fevers or having chills to notify us

## 2013-05-05 NOTE — Patient Instructions (Signed)
For now do:  1- stop Spiriva for next 4 weeks to see if the dryness in the mouth gets better  2- use humidifier  3- hopefully can restart Spiriva once March comes (obviously if it causes the same issues we may have to do without)

## 2013-05-09 DIAGNOSIS — Q828 Other specified congenital malformations of skin: Secondary | ICD-10-CM | POA: Diagnosis not present

## 2013-05-09 DIAGNOSIS — M79609 Pain in unspecified limb: Secondary | ICD-10-CM | POA: Diagnosis not present

## 2013-05-09 DIAGNOSIS — B351 Tinea unguium: Secondary | ICD-10-CM | POA: Diagnosis not present

## 2013-05-18 DIAGNOSIS — R918 Other nonspecific abnormal finding of lung field: Secondary | ICD-10-CM | POA: Diagnosis not present

## 2013-05-18 DIAGNOSIS — Z85118 Personal history of other malignant neoplasm of bronchus and lung: Secondary | ICD-10-CM | POA: Diagnosis not present

## 2013-05-18 DIAGNOSIS — C3490 Malignant neoplasm of unspecified part of unspecified bronchus or lung: Secondary | ICD-10-CM | POA: Diagnosis not present

## 2013-05-18 DIAGNOSIS — Z09 Encounter for follow-up examination after completed treatment for conditions other than malignant neoplasm: Secondary | ICD-10-CM | POA: Diagnosis not present

## 2013-05-18 DIAGNOSIS — J438 Other emphysema: Secondary | ICD-10-CM | POA: Diagnosis not present

## 2013-05-18 DIAGNOSIS — I2510 Atherosclerotic heart disease of native coronary artery without angina pectoris: Secondary | ICD-10-CM | POA: Diagnosis not present

## 2013-05-23 ENCOUNTER — Telehealth: Payer: Self-pay | Admitting: Family Medicine

## 2013-05-23 ENCOUNTER — Other Ambulatory Visit: Payer: Self-pay | Admitting: Family Medicine

## 2013-05-23 MED ORDER — ALBUTEROL SULFATE HFA 108 (90 BASE) MCG/ACT IN AERS: INHALATION_SPRAY | RESPIRATORY_TRACT | Status: DC

## 2013-05-23 NOTE — Telephone Encounter (Signed)
Pt needs this refilled early due to being taken off the spireva he has been  Using this inhaler more often.   VENTOLIN HFA 108 (90 BASE) MCG/ACT inhaler  [facility] [address]   PharmD stated we would need to approve

## 2013-05-23 NOTE — Telephone Encounter (Signed)
Notified patient's son medication was sent to pharmacy.

## 2013-05-23 NOTE — Telephone Encounter (Signed)
May refill times 6,if continued worsening then follow up needed

## 2013-06-03 ENCOUNTER — Emergency Department (HOSPITAL_COMMUNITY): Payer: Medicare Other

## 2013-06-03 ENCOUNTER — Encounter (HOSPITAL_COMMUNITY): Payer: Self-pay | Admitting: Emergency Medicine

## 2013-06-03 ENCOUNTER — Inpatient Hospital Stay (HOSPITAL_COMMUNITY)
Admission: EM | Admit: 2013-06-03 | Discharge: 2013-06-05 | DRG: 190 | Disposition: A | Discharge status: Home / Self Care | Payer: Medicare Other | Attending: Internal Medicine | Admitting: Internal Medicine

## 2013-06-03 DIAGNOSIS — R0602 Shortness of breath: Secondary | ICD-10-CM | POA: Diagnosis not present

## 2013-06-03 DIAGNOSIS — N183 Chronic kidney disease, stage 3 unspecified: Secondary | ICD-10-CM | POA: Diagnosis not present

## 2013-06-03 DIAGNOSIS — Z9221 Personal history of antineoplastic chemotherapy: Secondary | ICD-10-CM

## 2013-06-03 DIAGNOSIS — J209 Acute bronchitis, unspecified: Principal | ICD-10-CM | POA: Diagnosis present

## 2013-06-03 DIAGNOSIS — I679 Cerebrovascular disease, unspecified: Secondary | ICD-10-CM | POA: Diagnosis present

## 2013-06-03 DIAGNOSIS — J440 Chronic obstructive pulmonary disease with acute lower respiratory infection: Principal | ICD-10-CM | POA: Diagnosis present

## 2013-06-03 DIAGNOSIS — M1710 Unilateral primary osteoarthritis, unspecified knee: Secondary | ICD-10-CM | POA: Diagnosis present

## 2013-06-03 DIAGNOSIS — Z85118 Personal history of other malignant neoplasm of bronchus and lung: Secondary | ICD-10-CM

## 2013-06-03 DIAGNOSIS — J9601 Acute respiratory failure with hypoxia: Secondary | ICD-10-CM | POA: Diagnosis present

## 2013-06-03 DIAGNOSIS — Z8249 Family history of ischemic heart disease and other diseases of the circulatory system: Secondary | ICD-10-CM

## 2013-06-03 DIAGNOSIS — I10 Essential (primary) hypertension: Secondary | ICD-10-CM | POA: Diagnosis present

## 2013-06-03 DIAGNOSIS — I2510 Atherosclerotic heart disease of native coronary artery without angina pectoris: Secondary | ICD-10-CM | POA: Diagnosis present

## 2013-06-03 DIAGNOSIS — Z87891 Personal history of nicotine dependence: Secondary | ICD-10-CM

## 2013-06-03 DIAGNOSIS — Z923 Personal history of irradiation: Secondary | ICD-10-CM | POA: Diagnosis not present

## 2013-06-03 DIAGNOSIS — C3490 Malignant neoplasm of unspecified part of unspecified bronchus or lung: Secondary | ICD-10-CM | POA: Diagnosis not present

## 2013-06-03 DIAGNOSIS — E785 Hyperlipidemia, unspecified: Secondary | ICD-10-CM | POA: Diagnosis present

## 2013-06-03 DIAGNOSIS — Z951 Presence of aortocoronary bypass graft: Secondary | ICD-10-CM | POA: Diagnosis not present

## 2013-06-03 DIAGNOSIS — J984 Other disorders of lung: Secondary | ICD-10-CM | POA: Diagnosis not present

## 2013-06-03 DIAGNOSIS — Z7982 Long term (current) use of aspirin: Secondary | ICD-10-CM | POA: Diagnosis not present

## 2013-06-03 DIAGNOSIS — J441 Chronic obstructive pulmonary disease with (acute) exacerbation: Secondary | ICD-10-CM | POA: Diagnosis not present

## 2013-06-03 DIAGNOSIS — J9600 Acute respiratory failure, unspecified whether with hypoxia or hypercapnia: Secondary | ICD-10-CM | POA: Diagnosis not present

## 2013-06-03 DIAGNOSIS — I129 Hypertensive chronic kidney disease with stage 1 through stage 4 chronic kidney disease, or unspecified chronic kidney disease: Secondary | ICD-10-CM | POA: Diagnosis present

## 2013-06-03 LAB — CBC WITH DIFFERENTIAL/PLATELET
Basophils Absolute: 0 10*3/uL (ref 0.0–0.1)
Basophils Relative: 0 % (ref 0–1)
EOS ABS: 0.3 10*3/uL (ref 0.0–0.7)
EOS PCT: 3 % (ref 0–5)
HCT: 31 % — ABNORMAL LOW (ref 39.0–52.0)
Hemoglobin: 10.5 g/dL — ABNORMAL LOW (ref 13.0–17.0)
LYMPHS ABS: 2.9 10*3/uL (ref 0.7–4.0)
LYMPHS PCT: 35 % (ref 12–46)
MCH: 32.9 pg (ref 26.0–34.0)
MCHC: 33.9 g/dL (ref 30.0–36.0)
MCV: 97.2 fL (ref 78.0–100.0)
Monocytes Absolute: 1.4 10*3/uL — ABNORMAL HIGH (ref 0.1–1.0)
Monocytes Relative: 17 % — ABNORMAL HIGH (ref 3–12)
NEUTROS PCT: 44 % (ref 43–77)
Neutro Abs: 3.6 10*3/uL (ref 1.7–7.7)
Platelets: 174 10*3/uL (ref 150–400)
RBC: 3.19 MIL/uL — AB (ref 4.22–5.81)
RDW: 14.6 % (ref 11.5–15.5)
WBC: 8.1 10*3/uL (ref 4.0–10.5)

## 2013-06-03 LAB — COMPREHENSIVE METABOLIC PANEL
ALK PHOS: 100 U/L (ref 39–117)
ALT: 11 U/L (ref 0–53)
AST: 20 U/L (ref 0–37)
Albumin: 3.6 g/dL (ref 3.5–5.2)
BUN: 32 mg/dL — ABNORMAL HIGH (ref 6–23)
CALCIUM: 9.1 mg/dL (ref 8.4–10.5)
CO2: 25 meq/L (ref 19–32)
Chloride: 100 mEq/L (ref 96–112)
Creatinine, Ser: 1.97 mg/dL — ABNORMAL HIGH (ref 0.50–1.35)
GFR calc Af Amer: 33 mL/min — ABNORMAL LOW (ref >90)
GFR calc non Af Amer: 28 mL/min — ABNORMAL LOW (ref >90)
GLUCOSE: 123 mg/dL — AB (ref 70–99)
POTASSIUM: 4.1 meq/L (ref 3.7–5.3)
SODIUM: 138 meq/L (ref 137–147)
TOTAL PROTEIN: 6.7 g/dL (ref 6.0–8.3)
Total Bilirubin: 0.5 mg/dL (ref 0.3–1.2)

## 2013-06-03 LAB — TROPONIN I: Troponin I: <0.3 ng/mL (ref <0.30)

## 2013-06-03 MED ORDER — DEXTROSE 5 % IV SOLN
500.0000 mg | Freq: Once | INTRAVENOUS | Status: AC
  Administered 2013-06-03: 500 mg via INTRAVENOUS

## 2013-06-03 MED ORDER — PREDNISONE 50 MG PO TABS
60.0000 mg | ORAL_TABLET | Freq: Once | ORAL | Status: AC
  Administered 2013-06-03: 60 mg via ORAL
  Filled 2013-06-03 (×2): qty 1

## 2013-06-03 MED ORDER — DEXTROSE 5 % IV SOLN
1.0000 g | Freq: Once | INTRAVENOUS | Status: DC
  Filled 2013-06-03: qty 10

## 2013-06-03 MED ORDER — ALBUTEROL SULFATE (2.5 MG/3ML) 0.083% IN NEBU
5.0000 mg | INHALATION_SOLUTION | Freq: Once | RESPIRATORY_TRACT | Status: AC
  Administered 2013-06-03: 5 mg via RESPIRATORY_TRACT
  Filled 2013-06-03: qty 6

## 2013-06-03 MED ORDER — IPRATROPIUM BROMIDE 0.02 % IN SOLN
0.5000 mg | Freq: Once | RESPIRATORY_TRACT | Status: AC
  Administered 2013-06-03: 0.5 mg via RESPIRATORY_TRACT
  Filled 2013-06-03: qty 2.5

## 2013-06-03 NOTE — ED Notes (Signed)
Patient reports shortness of breath and worsening cough.

## 2013-06-03 NOTE — ED Provider Notes (Signed)
CSN: [id number]     Arrival date & time 06/03/13  2149 History  This chart was scribed for [name] Essex, MD by Jenne Campus, ED Scribe. This patient was seen in room APA16A/APA16A and the patient's care was started at 10:05 PM.   Chief Complaint  Patient presents with  . Shortness of Breath     The history is provided by the patient. No language interpreter was used.    HPI Comments: [name] is a 80 y.o. male with a h/o COPD and Lung CA who presents to the Emergency Department complaining of worsening of chronic SOB with associated cough that started this morning upon waking. The SOB is worse with exertion. The cough is productive of yellowish-white mucus and CP with coughing only. He reports that he has used his inhalers at home with no improvement. He has Symbicort which he uses every day and albuterol PRN which pt has been using 5 to 6 times daily. He denies any HA, back pain, abdominal pain or sore throat. He has been eating and drinking normally. He denies smoking.    PCP is Dr. Sallee Lange   Past Medical History  Diagnosis Date  . ASCVD (arteriosclerotic cardiovascular disease)      CABG in 04/1993; and negative stress nuclear study in 08/2001  . Hyperlipidemia   . Syncope   . Hypertension   . Tobacco abuse, in remission     40 pack years; discontinued in 1980  . Cerebrovascular disease     Right carotid bruit-40-69% left internal carotid artery stenosis in 4/06; followed [facility]  . Chronic kidney disease, stage II (mild)     Creatinine-1.6 in 09/2008  . Degenerative joint disease of knee, left   . Normocytic anemia   . Pulmonary disease   . Cancer     skin  . Lung cancer 2013  . Prediabetes   . CAD (coronary artery disease)   . COPD (chronic obstructive pulmonary disease)   . Benign essential tremor   . Horseshoe kidney   . Renal insufficiency    Past Surgical History  Procedure Laterality Date  . Coronary artery bypass graft  1995  . Colonoscopy w/  polypectomy  1985  . Pilonidal cyst excision  1948  . Lesion excision    . Cardiac surgery     Family History  Problem Relation Age of Onset  . Cancer Brother   . Heart attack Brother    History  Substance Use Topics  . Smoking status: Former Smoker -- 1.00 packs/day for 40 years    Types: Cigarettes    Quit date: 08/14/1973  . Smokeless tobacco: Former Systems developer    Types: [place] date: 12/05/1980  . Alcohol Use: No    Review of Systems  A complete 10 system review of systems was obtained and all systems are negative except as noted in the HPI and PMH.   Allergies  Beta adrenergic blockers; Levaquin; Penicillins; and Sulfonamide derivatives  Home Medications   No current outpatient prescriptions on file. Triage Vitals: BP 166/52  Pulse 80  Temp(Src) 98.7 F (37.1 C) (Oral)  Resp 25  Ht 5\' 7"  (1.702 m)  Wt 173 lb (78.472 kg)  BMI 27.09 kg/m2  SpO2 95%  Physical Exam  Nursing note and vitals reviewed. Constitutional: He is oriented to person, place, and time. He appears well-developed and well-nourished. No distress.  HENT:  Head: Normocephalic and atraumatic.  Eyes: EOM are normal.  Neck: Neck supple.  No tracheal deviation present.  Cardiovascular: Normal rate and regular rhythm.   Pulmonary/Chest: Effort normal. No respiratory distress. He has wheezes.  Upper air way congestion. Scattered expiratory wheezing, worse on the right   Abdominal: Soft. There is no tenderness. There is no rebound and no guarding.  Musculoskeletal: Normal range of motion.  Intact peripheral pulses, no peripheral edema  Neurological: He is alert and oriented to person, place, and time.  Resting tremor of arms and lower lip  Skin: Skin is warm and dry.  Psychiatric: He has a normal mood and affect. His behavior is normal.    ED Course  Procedures (including critical care time)  Medications  cefTRIAXone (ROCEPHIN) 1 g in dextrose 5 % 50 mL IVPB (1 g Intravenous  Transfusing/Transfer 06/04/13 0050)  0.9 %  sodium chloride infusion (not administered)  albuterol (PROVENTIL) (2.5 MG/3ML) 0.083% nebulizer solution 5 mg (5 mg Nebulization Given 06/03/13 2219)  ipratropium (ATROVENT) nebulizer solution 0.5 mg (0.5 mg Nebulization Given 06/03/13 2219)  predniSONE (DELTASONE) tablet 60 mg (60 mg Oral Given 06/03/13 2222)  azithromycin (ZITHROMAX) 500 mg in dextrose 5 % 250 mL IVPB (0 mg Intravenous Stopped 06/04/13 0050)  oseltamivir (TAMIFLU) capsule 75 mg (75 mg Oral Given 06/04/13 0010)  albuterol (PROVENTIL) (2.5 MG/3ML) 0.083% nebulizer solution 5 mg (5 mg Nebulization Given 06/04/13 0134)  ipratropium (ATROVENT) nebulizer solution 0.5 mg (0.5 mg Nebulization Given 06/04/13 0134)    DIAGNOSTIC STUDIES: Oxygen Saturation is 95% on RA, adequate by my interpretation.    COORDINATION OF CARE: 10:21 PM-Discussed treatment plan which includes breathing tx, CXR, CBC panel, CMP and troponin with pt at bedside and pt agreed to plan.   Labs Review Labs Reviewed  CBC WITH DIFFERENTIAL - Abnormal; Notable for the following:    RBC 3.19 (*)    Hemoglobin 10.5 (*)    HCT 31.0 (*)    Monocytes Relative 17 (*)    Monocytes Absolute 1.4 (*)    All other components within normal limits  COMPREHENSIVE METABOLIC PANEL - Abnormal; Notable for the following:    Glucose, Bld 123 (*)    BUN 32 (*)    Creatinine, Ser 1.97 (*)    GFR calc non Af Amer 28 (*)    GFR calc Af Amer 33 (*)    All other components within normal limits  TROPONIN I  INFLUENZA PANEL BY PCR (TYPE A & B, H1N1)   Imaging Review Dg Chest 2 View  06/03/2013   CLINICAL DATA:  Wheezing and shortness of breath. History of lung cancer.  EXAM: CHEST  2 VIEW  COMPARISON:  CT of the chest performed 08/16/2012, and chest radiograph from 06/20/2012  FINDINGS: There is persistent dense opacification at the left lung apex. Given its stability from recent prior studies, this may reflect post therapy changes and  chronic postobstructive consolidation. However, as before, recurrent malignancy cannot be excluded. A small right pleural effusion is suspected. No pneumothorax is seen. The lungs are otherwise clear.  The heart remains normal in size. The patient is status post median sternotomy. No acute osseous abnormalities are identified.  IMPRESSION: Persistent dense opacification of the left lung apex is stable from prior studies. As before, this may reflect posttherapy changes and chronic postobstructive consolidation, though recurrent malignancy again cannot be excluded. Suspect small right pleural effusion. Lungs otherwise clear.   Electronically Signed   By: Garald Balding M.D.   On: 06/03/2013 23:32      MDM   Final diagnoses:  COPD exacerbation  hx COPD, lung cancer in remission per patient presenting with cough and SOB wince this morning.  No fever or chest pain.  Nebs, steroids, EKG, CXR. CXR shows stable L apex opacity.  Cr elevated but at baseline. EKG nsr, without ST changes.  Ambulatory with some dyspnea and wheezing.  Oxygen saturation decreased to 90%. With wheezing, PE seems less likely, unable to give IV contrast due to CKD>  Dyspnea persists, further nebs given and admission d/w Dr. Shanon Brow.   I personally performed the services described in this documentation, which was scribed in my presence. The recorded information has been reviewed and is accurate.      [name] Essex, MD 06/04/13 [phone]

## 2013-06-04 ENCOUNTER — Encounter (HOSPITAL_COMMUNITY): Payer: Self-pay | Admitting: General Practice

## 2013-06-04 DIAGNOSIS — R0602 Shortness of breath: Secondary | ICD-10-CM | POA: Diagnosis present

## 2013-06-04 DIAGNOSIS — J9600 Acute respiratory failure, unspecified whether with hypoxia or hypercapnia: Secondary | ICD-10-CM

## 2013-06-04 DIAGNOSIS — C3490 Malignant neoplasm of unspecified part of unspecified bronchus or lung: Secondary | ICD-10-CM

## 2013-06-04 DIAGNOSIS — I10 Essential (primary) hypertension: Secondary | ICD-10-CM

## 2013-06-04 DIAGNOSIS — J9601 Acute respiratory failure with hypoxia: Secondary | ICD-10-CM | POA: Diagnosis present

## 2013-06-04 DIAGNOSIS — J441 Chronic obstructive pulmonary disease with (acute) exacerbation: Secondary | ICD-10-CM | POA: Diagnosis present

## 2013-06-04 DIAGNOSIS — N183 Chronic kidney disease, stage 3 unspecified: Secondary | ICD-10-CM

## 2013-06-04 LAB — INFLUENZA PANEL BY PCR (TYPE A & B)
H1N1FLUPCR: NOT DETECTED
INFLBPCR: NEGATIVE
Influenza A By PCR: NEGATIVE

## 2013-06-04 LAB — CBC
HEMATOCRIT: 29.4 % — AB (ref 39.0–52.0)
HEMOGLOBIN: 9.8 g/dL — AB (ref 13.0–17.0)
MCH: 32.2 pg (ref 26.0–34.0)
MCHC: 33.3 g/dL (ref 30.0–36.0)
MCV: 96.7 fL (ref 78.0–100.0)
Platelets: 166 10*3/uL (ref 150–400)
RBC: 3.04 MIL/uL — ABNORMAL LOW (ref 4.22–5.81)
RDW: 14.8 % (ref 11.5–15.5)
WBC: 5.6 10*3/uL (ref 4.0–10.5)

## 2013-06-04 LAB — BASIC METABOLIC PANEL
BUN: 32 mg/dL — ABNORMAL HIGH (ref 6–23)
CHLORIDE: 100 meq/L (ref 96–112)
CO2: 25 mEq/L (ref 19–32)
CREATININE: 1.96 mg/dL — AB (ref 0.50–1.35)
Calcium: 9.2 mg/dL (ref 8.4–10.5)
GFR calc Af Amer: 33 mL/min — ABNORMAL LOW (ref >90)
GFR calc non Af Amer: 29 mL/min — ABNORMAL LOW (ref >90)
GLUCOSE: 175 mg/dL — AB (ref 70–99)
Potassium: 4 mEq/L (ref 3.7–5.3)
Sodium: 137 mEq/L (ref 137–147)

## 2013-06-04 LAB — GLUCOSE, CAPILLARY
GLUCOSE-CAPILLARY: 190 mg/dL — AB (ref 70–99)
Glucose-Capillary: 167 mg/dL — ABNORMAL HIGH (ref 70–99)

## 2013-06-04 LAB — TROPONIN I
Troponin I: <0.3 ng/mL (ref <0.30)
Troponin I: <0.3 ng/mL (ref <0.30)

## 2013-06-04 MED ORDER — POTASSIUM CHLORIDE CRYS ER 10 MEQ PO TBCR
10.0000 meq | EXTENDED_RELEASE_TABLET | Freq: Two times a day (BID) | ORAL | Status: DC
  Administered 2013-06-04 – 2013-06-05 (×3): 10 meq via ORAL
  Filled 2013-06-04 (×6): qty 1

## 2013-06-04 MED ORDER — SODIUM CHLORIDE 0.9 % IJ SOLN: 3.0000 mL | INTRAMUSCULAR | Status: DC | PRN

## 2013-06-04 MED ORDER — OSELTAMIVIR PHOSPHATE 30 MG PO CAPS
30.0000 mg | ORAL_CAPSULE | Freq: Every day | ORAL | Status: DC
  Administered 2013-06-04 (×2): 30 mg via ORAL
  Filled 2013-06-04 (×2): qty 1

## 2013-06-04 MED ORDER — ASPIRIN 81 MG PO CHEW
81.0000 mg | CHEWABLE_TABLET | Freq: Every day | ORAL | Status: DC
  Administered 2013-06-04 – 2013-06-05 (×2): 81 mg via ORAL
  Filled 2013-06-04 (×2): qty 1

## 2013-06-04 MED ORDER — DEXTROSE 5 % IV SOLN
INTRAVENOUS | Status: AC
  Filled 2013-06-04: qty 10

## 2013-06-04 MED ORDER — SODIUM CHLORIDE 0.9 % IV SOLN: INTRAVENOUS | Status: DC

## 2013-06-04 MED ORDER — SODIUM CHLORIDE 0.9 % IV SOLN: 250.0000 mL | INTRAVENOUS | Status: DC | PRN

## 2013-06-04 MED ORDER — IPRATROPIUM BROMIDE 0.02 % IN SOLN: 0.5000 mg | Freq: Four times a day (QID) | RESPIRATORY_TRACT | Status: DC

## 2013-06-04 MED ORDER — ALBUTEROL SULFATE (2.5 MG/3ML) 0.083% IN NEBU: 2.5000 mg | INHALATION_SOLUTION | RESPIRATORY_TRACT | Status: DC | PRN

## 2013-06-04 MED ORDER — PREDNISONE 20 MG PO TABS
20.0000 mg | ORAL_TABLET | Freq: Two times a day (BID) | ORAL | Status: DC
  Administered 2013-06-04 – 2013-06-05 (×3): 20 mg via ORAL
  Filled 2013-06-04 (×3): qty 1

## 2013-06-04 MED ORDER — ALBUTEROL SULFATE (2.5 MG/3ML) 0.083% IN NEBU
5.0000 mg | INHALATION_SOLUTION | Freq: Once | RESPIRATORY_TRACT | Status: AC
  Administered 2013-06-04: 5 mg via RESPIRATORY_TRACT
  Filled 2013-06-04: qty 6

## 2013-06-04 MED ORDER — IPRATROPIUM-ALBUTEROL 0.5-2.5 (3) MG/3ML IN SOLN
3.0000 mL | Freq: Four times a day (QID) | RESPIRATORY_TRACT | Status: DC
  Administered 2013-06-04 (×2): 3 mL via RESPIRATORY_TRACT
  Filled 2013-06-04 (×2): qty 3

## 2013-06-04 MED ORDER — GUAIFENESIN-DM 100-10 MG/5ML PO SYRP: 5.0000 mL | ORAL_SOLUTION | ORAL | Status: DC | PRN

## 2013-06-04 MED ORDER — TORSEMIDE 20 MG PO TABS
20.0000 mg | ORAL_TABLET | Freq: Two times a day (BID) | ORAL | Status: DC
  Administered 2013-06-04 – 2013-06-05 (×3): 20 mg via ORAL
  Filled 2013-06-04 (×3): qty 1

## 2013-06-04 MED ORDER — IPRATROPIUM-ALBUTEROL 0.5-2.5 (3) MG/3ML IN SOLN
3.0000 mL | Freq: Three times a day (TID) | RESPIRATORY_TRACT | Status: DC
  Administered 2013-06-04 – 2013-06-05 (×2): 3 mL via RESPIRATORY_TRACT
  Filled 2013-06-04 (×2): qty 3

## 2013-06-04 MED ORDER — DILTIAZEM HCL ER COATED BEADS 180 MG PO CP24
180.0000 mg | ORAL_CAPSULE | Freq: Every day | ORAL | Status: DC
  Administered 2013-06-04 – 2013-06-05 (×2): 180 mg via ORAL
  Filled 2013-06-04 (×2): qty 1

## 2013-06-04 MED ORDER — IPRATROPIUM BROMIDE 0.02 % IN SOLN
0.5000 mg | Freq: Once | RESPIRATORY_TRACT | Status: AC
  Administered 2013-06-04: 0.5 mg via RESPIRATORY_TRACT
  Filled 2013-06-04: qty 2.5

## 2013-06-04 MED ORDER — SODIUM CHLORIDE 0.9 % IJ SOLN
3.0000 mL | Freq: Two times a day (BID) | INTRAMUSCULAR | Status: DC
  Administered 2013-06-04: 3 mL via INTRAVENOUS

## 2013-06-04 MED ORDER — NITROGLYCERIN 0.4 MG SL SUBL: 0.4000 mg | SUBLINGUAL_TABLET | SUBLINGUAL | Status: DC | PRN

## 2013-06-04 MED ORDER — TERAZOSIN HCL 5 MG PO CAPS
5.0000 mg | ORAL_CAPSULE | Freq: Every day | ORAL | Status: DC
  Administered 2013-06-04: 5 mg via ORAL
  Filled 2013-06-04: qty 1

## 2013-06-04 MED ORDER — OSELTAMIVIR PHOSPHATE 75 MG PO CAPS
75.0000 mg | ORAL_CAPSULE | Freq: Once | ORAL | Status: AC
  Administered 2013-06-04: 75 mg via ORAL
  Filled 2013-06-04: qty 1

## 2013-06-04 MED ORDER — DEXTROSE 5 % IV SOLN
1.0000 g | INTRAVENOUS | Status: DC
  Administered 2013-06-04 (×2): 1 g via INTRAVENOUS
  Filled 2013-06-04 (×3): qty 10

## 2013-06-04 MED ORDER — DEXTROSE 5 % IV SOLN
250.0000 mg | INTRAVENOUS | Status: DC
  Administered 2013-06-04: 250 mg via INTRAVENOUS
  Filled 2013-06-04 (×2): qty 250

## 2013-06-04 MED ORDER — ATORVASTATIN CALCIUM 40 MG PO TABS
40.0000 mg | ORAL_TABLET | Freq: Every day | ORAL | Status: DC
  Administered 2013-06-04: 40 mg via ORAL
  Filled 2013-06-04: qty 1

## 2013-06-04 MED ORDER — ADULT MULTIVITAMIN W/MINERALS CH
1.0000 | ORAL_TABLET | Freq: Every day | ORAL | Status: DC
  Administered 2013-06-04 – 2013-06-05 (×2): 1 via ORAL
  Filled 2013-06-04 (×2): qty 1

## 2013-06-04 MED ORDER — SODIUM CHLORIDE 0.9 % IJ SOLN
3.0000 mL | Freq: Two times a day (BID) | INTRAMUSCULAR | Status: DC
  Administered 2013-06-05: 3 mL via INTRAVENOUS

## 2013-06-04 MED ORDER — ALBUTEROL SULFATE (2.5 MG/3ML) 0.083% IN NEBU: 2.5000 mg | INHALATION_SOLUTION | Freq: Four times a day (QID) | RESPIRATORY_TRACT | Status: DC

## 2013-06-04 MED ORDER — METOPROLOL TARTRATE 50 MG PO TABS
50.0000 mg | ORAL_TABLET | Freq: Every day | ORAL | Status: DC
  Administered 2013-06-04 – 2013-06-05 (×2): 50 mg via ORAL
  Filled 2013-06-04 (×2): qty 1

## 2013-06-04 MED ORDER — BUDESONIDE-FORMOTEROL FUMARATE 160-4.5 MCG/ACT IN AERO
1.0000 | INHALATION_SPRAY | Freq: Two times a day (BID) | RESPIRATORY_TRACT | Status: DC
  Administered 2013-06-04 – 2013-06-05 (×3): 1 via RESPIRATORY_TRACT
  Filled 2013-06-04: qty 6

## 2013-06-04 MED ORDER — HEPARIN SODIUM (PORCINE) 5000 UNIT/ML IJ SOLN
5000.0000 [IU] | Freq: Three times a day (TID) | INTRAMUSCULAR | Status: DC
  Administered 2013-06-04 – 2013-06-05 (×3): 5000 [IU] via SUBCUTANEOUS
  Filled 2013-06-04 (×3): qty 1

## 2013-06-04 NOTE — ED Notes (Signed)
Report called to Mendel Ryder, RN on unit 300.

## 2013-06-04 NOTE — Progress Notes (Signed)
Influenza screening negative.  Removed droplet isolation.

## 2013-06-04 NOTE — Progress Notes (Addendum)
ANTICOAGULATION CONSULT NOTE - Initial Consult  Pharmacy Consult for Heparin Indication: VTE prophylaxis  Allergies  Allergen Reactions  . Beta Adrenergic Blockers Hives and Swelling  . Levaquin [Levofloxacin In D5w] Nausea And Vomiting  . Penicillins Hives and Swelling  . Sulfonamide Derivatives Hives and Swelling    Patient Measurements: Height: 5\' 7"  (170.2 cm) Weight: 170 lb 10.2 oz (77.4 kg) IBW/kg (Calculated) : 66.1 Heparin Dosing Weight:   Vital Signs: Temp: 97.8 F (36.6 C) (02/22 0502) Temp src: Oral (02/22 0502) BP: 140/61 mmHg (02/22 0502) Pulse Rate: 90 (02/22 0502)  Labs:  Recent Labs  06/03/13 2252 06/04/13 0448 06/04/13 0449  HGB 10.5*  --  9.8*  HCT 31.0*  --  29.4*  PLT 174  --  166  CREATININE 1.97* 1.96*  --   TROPONINI <0.30 <0.30  --     Estimated Creatinine Clearance: 23.9 ml/min (by C-G formula based on Cr of 1.96).   Medical History: Past Medical History  Diagnosis Date  . ASCVD (arteriosclerotic cardiovascular disease)      CABG in 04/1993; and negative stress nuclear study in 08/2001  . Hyperlipidemia   . Syncope   . Hypertension   . Tobacco abuse, in remission     40 pack years; discontinued in 1980  . Cerebrovascular disease     Right carotid bruit-40-69% left internal carotid artery stenosis in 4/06; followed [facility]  . Chronic kidney disease, stage II (mild)     Creatinine-1.6 in 09/2008  . Degenerative joint disease of knee, left   . Normocytic anemia   . Pulmonary disease   . Cancer     skin  . Lung cancer 2013  . Prediabetes   . CAD (coronary artery disease)   . COPD (chronic obstructive pulmonary disease)   . Benign essential tremor   . Horseshoe kidney   . Renal insufficiency     Medications:  Scheduled:  . aspirin  81 mg Oral Daily  . atorvastatin  40 mg Oral QPC supper  . azithromycin  250 mg Intravenous Q24H  . budesonide-formoterol  1 puff Inhalation BID  . cefTRIAXone (ROCEPHIN)  IV  1 g Intravenous Q24H   . diltiazem  180 mg Oral Daily  . heparin subcutaneous  5,000 Units Subcutaneous 3 times per day  . ipratropium-albuterol  3 mL Nebulization Q6H  . metoprolol  50 mg Oral Daily  . multivitamin with minerals  1 tablet Oral Daily  . oseltamivir  30 mg Oral QHS  . potassium chloride  10 mEq Oral BID  . predniSONE  20 mg Oral BID WC  . sodium chloride  3 mL Intravenous Q12H  . sodium chloride  3 mL Intravenous Q12H  . terazosin  5 mg Oral QHS  . torsemide  20 mg Oral BID    Assessment: Heparin SQ for DVT prophylaxis Labs reviewed  Goal of Therapy:  DVT prophylaxis Monitor platelets by anticoagulation protocol: Yes   Plan:  Heparin 5000 Units SQ every 8 hours Monitor CBC, platelets  [place], Person Bennett 06/04/2013,10:06 AM  No dose adjustments anticipated.  Pharmacy will sign off.  Please re-consult if needed.  Thanks! Netta Cedars, PharmD, BCPS 06/05/2013@9 :88 AM

## 2013-06-04 NOTE — Progress Notes (Signed)
Pt's family brought in some medical records from previous visits at [facility] concerning oncology. Copies have been made and placed in pt's chart. I have also faxed the request for medical records form to [facility]. Will place records in pt chart. Will continue to monitor.

## 2013-06-04 NOTE — H&P (Signed)
PCP:   Sallee Lange, MD   Chief Complaint:  sob  HPI: 80 yo male h/o copd, lung cancer lul treated at [facility] with chemo 2 years ago, ckd comes in with several days of worsening sob.  No fevers.  Coughing a lot.  No hemoptysis.  No n/v/d.  Some general malaise and nasal congestion.  Wheezing more than normal.  No le edema or swelling.  No cp.  Pt states that he had a ct scan of his lungs last week for his cancer f/u at [facility] which was "normal".  hes been told that all of his cancer is gone.    Review of Systems:  Positive and negative as per HPI otherwise all other systems are negative  Past Medical History: Past Medical History  Diagnosis Date  . ASCVD (arteriosclerotic cardiovascular disease)      CABG in 04/1993; and negative stress nuclear study in 08/2001  . Hyperlipidemia   . Syncope   . Hypertension   . Tobacco abuse, in remission     40 pack years; discontinued in 1980  . Cerebrovascular disease     Right carotid bruit-40-69% left internal carotid artery stenosis in 4/06; followed [facility]  . Chronic kidney disease, stage II (mild)     Creatinine-1.6 in 09/2008  . Degenerative joint disease of knee, left   . Normocytic anemia   . Pulmonary disease   . Cancer     skin  . Lung cancer 2013  . Prediabetes   . CAD (coronary artery disease)   . COPD (chronic obstructive pulmonary disease)   . Benign essential tremor   . Horseshoe kidney   . Renal insufficiency    Past Surgical History  Procedure Laterality Date  . Coronary artery bypass graft  1995  . Colonoscopy w/ polypectomy  1985  . Pilonidal cyst excision  1948  . Lesion excision    . Cardiac surgery      Medications: Prior to Admission medications   Medication Sig Start Date End Date Taking? Authorizing Provider  albuterol (PROVENTIL HFA;VENTOLIN HFA) 108 (90 BASE) MCG/ACT inhaler Inhale 2 puffs into the lungs every 6 (six) hours as needed for shortness of breath. 05/23/13  Yes Kathyrn Drown, MD  aspirin (ASPIRIN  CHILDRENS) 81 MG chewable tablet Chew 1 tablet (81 mg total) by mouth daily. 11/03/12 11/03/13 Yes Kathyrn Drown, MD  atorvastatin (LIPITOR) 40 MG tablet Take 1 tablet (40 mg total) by mouth daily. 11/28/12  Yes Kathyrn Drown, MD  budesonide-formoterol (SYMBICORT) 160-4.5 MCG/ACT inhaler Inhale 1 puff into the lungs 2 (two) times daily.   Yes Historical Provider, MD  diltiazem (CARDIZEM CD) 180 MG 24 hr capsule Take 1 capsule (180 mg total) by mouth daily. 11/28/12  Yes Kathyrn Drown, MD  metoprolol (LOPRESSOR) 50 MG tablet Take 50 mg by mouth daily.   Yes Historical Provider, MD  Multiple Vitamin (MULTIVITAMIN WITH MINERALS) TABS Take 1 tablet by mouth daily.   Yes Historical Provider, MD  potassium chloride (K-DUR) 10 MEQ tablet Take 10 mEq by mouth 2 (two) times daily.   Yes Historical Provider, MD  terazosin (HYTRIN) 5 MG capsule Take 5 mg by mouth at bedtime.   Yes Historical Provider, MD  tetrahydrozoline-zinc (VISINE-AC) 0.05-0.25 % ophthalmic solution Place 1 drop into both eyes daily.    Yes Historical Provider, MD  torsemide (DEMADEX) 20 MG tablet Take 20 mg by mouth 2 (two) times daily.   Yes Historical Provider, MD  nitroGLYCERIN (NITROSTAT) 0.4 MG  SL tablet Place 1 tablet (0.4 mg total) under the tongue every 5 (five) minutes as needed for chest pain. 08/18/10   Yehuda Savannah, MD    Allergies:   Allergies  Allergen Reactions  . Beta Adrenergic Blockers Hives and Swelling  . Levaquin [Levofloxacin In D5w] Nausea And Vomiting  . Penicillins Hives and Swelling  . Sulfonamide Derivatives Hives and Swelling    Social History:  reports that he quit smoking about 39 years ago. His smoking use included Cigarettes. He has a 40 pack-year smoking history. He quit smokeless tobacco use about 32 years ago. His smokeless tobacco use included Chew. He reports that he does not drink alcohol or use illicit drugs.  Family History: Family History  Problem Relation Age of Onset  . Cancer  Brother   . Heart attack Brother     Physical Exam: Filed Vitals:   06/03/13 2200 06/03/13 2219 06/03/13 2323  BP: 166/52  123/39  Pulse: 80  79  Temp: 98.7 F (37.1 C)  97.7 F (36.5 C)  TempSrc: Oral  Oral  Resp: 25  22  Height: 5\' 7"  (1.702 m)    Weight: 78.472 kg (173 lb)    SpO2: 97% 95% 93%   General appearance: alert, cooperative and no distress Head: Normocephalic, without obvious abnormality, atraumatic Eyes: negative Nose: Nares normal. Septum midline. Mucosa normal. No drainage or sinus tenderness. Neck: no JVD and supple, symmetrical, trachea midline Lungs: clear to auscultation bilaterally  Mild wheezing bilaterally at bases Heart: regular rate and rhythm, S1, S2 normal, no murmur, click, rub or gallop Abdomen: soft, non-tender; bowel sounds normal; no masses,  no organomegaly Extremities: extremities normal, atraumatic, no cyanosis or edema Pulses: 2+ and symmetric Skin: Skin color, texture, turgor normal. No rashes or lesions Neurologic: Grossly normal  Labs on Admission:   Recent Labs  06/03/13 2252  NA 138  K 4.1  CL 100  CO2 25  GLUCOSE 123*  BUN 32*  CREATININE 1.97*  CALCIUM 9.1    Recent Labs  06/03/13 2252  AST 20  ALT 11  ALKPHOS 100  BILITOT 0.5  PROT 6.7  ALBUMIN 3.6    Recent Labs  06/03/13 2252  WBC 8.1  NEUTROABS 3.6  HGB 10.5*  HCT 31.0*  MCV 97.2  PLT 174    Recent Labs  06/03/13 2252  TROPONINI <0.30    Radiological Exams on Admission: Dg Chest 2 View  06/03/2013   CLINICAL DATA:  Wheezing and shortness of breath. History of lung cancer.  EXAM: CHEST  2 VIEW  COMPARISON:  CT of the chest performed 08/16/2012, and chest radiograph from 06/20/2012  FINDINGS: There is persistent dense opacification at the left lung apex. Given its stability from recent prior studies, this may reflect post therapy changes and chronic postobstructive consolidation. However, as before, recurrent malignancy cannot be excluded. A small  right pleural effusion is suspected. No pneumothorax is seen. The lungs are otherwise clear.  The heart remains normal in size. The patient is status post median sternotomy. No acute osseous abnormalities are identified.  IMPRESSION: Persistent dense opacification of the left lung apex is stable from prior studies. As before, this may reflect posttherapy changes and chronic postobstructive consolidation, though recurrent malignancy again cannot be excluded. Suspect small right pleural effusion. Lungs otherwise clear.   Electronically Signed   By: Garald Balding M.D.   On: 06/03/2013 23:32    Assessment/Plan  80 yo male with sob , infiltrate?mass lul with copd  exacerbation and possible influenza  Principal Problem:   COPD exacerbation- c possible postobs pna  Nebs.  Steroids.  Flu pending.  Need to get records from [facility].  Concerning that this consolidation is in same place as his previous carcinoma and pt was told it was "all gone".  Not clear if he has a persistent abnormality there post chemo, did not have resection.  Cover with CAP abx and tamiflu for now.    Active Problems:   HYPERTENSION   CKD (chronic kidney disease), stage III   Lung cancer   SOB (shortness of breath)   Acute respiratory failure with hypoxia  FULL CODE  Zuha Dejonge A 06/04/2013, 12:05 AM

## 2013-06-04 NOTE — Progress Notes (Signed)
[facility] PROGRESS NOTE  [name] UTM:[id number] DOB: 1921-06-15 DOA: 06/03/2013 PCP: Sallee Lange, MD  Assessment/Plan: 1. PNA; post obstructive PNA, Vs Community Acquired; Continue with ceftriaxone and Azithromycin day 2. Will order sputum culture. Influenza negative. 2. CKD; Cr baseline 1.8. Monitor on torsemide.  3. Acute COPD exacerbation; continue with prednisone, nebulizer treatments. Improved.  4. History of lung cancer:  Chest x ray Persistent dense opacification of the left lung apex is stable from prior studies. As before, this may reflect posttherapy changes and chronic postobstructive consolidation, though recurrent malignancy again cannot be excluded. Suspect small right pleural effusion. Lungs otherwise clear. Records from [facility] requested.    Code Status: Full code.  Family Communication: Care discussed with patient, and wife at bedside.  Disposition Plan: Remain inpatient, home at time of discharge.    Consultants:  none  Procedures:  none  Antibiotics:  Ceftriaxone 2-21  Azithromycin 2-21  HPI/Subjective: Breathing better. Cough improved.  He presents with worsening cough.   Objective: Filed Vitals:   06/04/13 0502  BP: 140/61  Pulse: 90  Temp: 97.8 F (36.6 C)  Resp: 22    Intake/Output Summary (Last 24 hours) at 06/04/13 0817 Last data filed at 06/04/13 0600  Gross per 24 hour  Intake    170 ml  Output    325 ml  Net   -155 ml   Filed Weights   06/03/13 2200 06/04/13 0502  Weight: 78.472 kg (173 lb) 77.4 kg (170 lb 10.2 oz)    Exam:   General: No acute distress, speaking full sentence.  Cardiovascular: S 1, S 2 RRR  Respiratory: Bilateral crackles, ronchus, no wheezes.   Abdomen: BS present, soft, NT  Musculoskeletal: trace edema.   Data Reviewed: Basic Metabolic Panel:  Recent Labs Lab 06/03/13 2252 06/04/13 0448  NA 138 137  K 4.1 4.0  CL 100 100  CO2 25 25  GLUCOSE 123* 175*  BUN 32* 32*  CREATININE  1.97* 1.96*  CALCIUM 9.1 9.2   Liver Function Tests:  Recent Labs Lab 06/03/13 2252  AST 20  ALT 11  ALKPHOS 100  BILITOT 0.5  PROT 6.7  ALBUMIN 3.6   No results found for this basename: LIPASE, AMYLASE,  in the last 168 hours No results found for this basename: AMMONIA,  in the last 168 hours CBC:  Recent Labs Lab 06/03/13 2252 06/04/13 0449  WBC 8.1 5.6  NEUTROABS 3.6  --   HGB 10.5* 9.8*  HCT 31.0* 29.4*  MCV 97.2 96.7  PLT 174 166   Cardiac Enzymes:  Recent Labs Lab 06/03/13 2252 06/04/13 0448  TROPONINI <0.30 <0.30   BNP (last 3 results) No results found for this basename: PROBNP,  in the last 8760 hours CBG:  Recent Labs Lab 06/04/13 0803  GLUCAP 167*    No results found for this or any previous visit (from the past 240 hour(s)).   Studies: Dg Chest 2 View  06/03/2013   CLINICAL DATA:  Wheezing and shortness of breath. History of lung cancer.  EXAM: CHEST  2 VIEW  COMPARISON:  CT of the chest performed 08/16/2012, and chest radiograph from 06/20/2012  FINDINGS: There is persistent dense opacification at the left lung apex. Given its stability from recent prior studies, this may reflect post therapy changes and chronic postobstructive consolidation. However, as before, recurrent malignancy cannot be excluded. A small right pleural effusion is suspected. No pneumothorax is seen. The lungs are otherwise clear.  The heart remains normal in  size. The patient is status post median sternotomy. No acute osseous abnormalities are identified.  IMPRESSION: Persistent dense opacification of the left lung apex is stable from prior studies. As before, this may reflect posttherapy changes and chronic postobstructive consolidation, though recurrent malignancy again cannot be excluded. Suspect small right pleural effusion. Lungs otherwise clear.   Electronically Signed   By: Garald Balding M.D.   On: 06/03/2013 23:32    Scheduled Meds: . aspirin  81 mg Oral Daily  .  atorvastatin  40 mg Oral QPC supper  . azithromycin  250 mg Intravenous Q24H  . budesonide-formoterol  1 puff Inhalation BID  . cefTRIAXone (ROCEPHIN)  IV  1 g Intravenous Q24H  . diltiazem  180 mg Oral Daily  . ipratropium-albuterol  3 mL Nebulization Q6H  . metoprolol  50 mg Oral Daily  . multivitamin with minerals  1 tablet Oral Daily  . oseltamivir  30 mg Oral QHS  . potassium chloride  10 mEq Oral BID  . predniSONE  20 mg Oral BID WC  . sodium chloride  3 mL Intravenous Q12H  . sodium chloride  3 mL Intravenous Q12H  . terazosin  5 mg Oral QHS  . torsemide  20 mg Oral BID   Continuous Infusions:   Principal Problem:   COPD exacerbation Active Problems:   HYPERTENSION   CKD (chronic kidney disease), stage III   Lung cancer   SOB (shortness of breath)   Acute respiratory failure with hypoxia    Time spent: 30 minutes.     Adeena Bernabe  [facility] Pager [phone]. If 7PM-7AM, please contact night-coverage at [email or web address], password [facility] 06/04/2013, 8:17 AM  LOS: 1 day

## 2013-06-05 DIAGNOSIS — J209 Acute bronchitis, unspecified: Secondary | ICD-10-CM

## 2013-06-05 LAB — CBC
HCT: 27.9 % — ABNORMAL LOW (ref 39.0–52.0)
Hemoglobin: 9.3 g/dL — ABNORMAL LOW (ref 13.0–17.0)
MCH: 31.8 pg (ref 26.0–34.0)
MCHC: 33.3 g/dL (ref 30.0–36.0)
MCV: 95.5 fL (ref 78.0–100.0)
Platelets: 157 10*3/uL (ref 150–400)
RBC: 2.92 MIL/uL — ABNORMAL LOW (ref 4.22–5.81)
RDW: 14.4 % (ref 11.5–15.5)
WBC: 11.2 10*3/uL — ABNORMAL HIGH (ref 4.0–10.5)

## 2013-06-05 LAB — BASIC METABOLIC PANEL
BUN: 47 mg/dL — ABNORMAL HIGH (ref 6–23)
CHLORIDE: 103 meq/L (ref 96–112)
CO2: 26 meq/L (ref 19–32)
Calcium: 9.2 mg/dL (ref 8.4–10.5)
Creatinine, Ser: 2.52 mg/dL — ABNORMAL HIGH (ref 0.50–1.35)
GFR calc Af Amer: 24 mL/min — ABNORMAL LOW (ref >90)
GFR calc non Af Amer: 21 mL/min — ABNORMAL LOW (ref >90)
Glucose, Bld: 133 mg/dL — ABNORMAL HIGH (ref 70–99)
Potassium: 4.6 mEq/L (ref 3.7–5.3)
SODIUM: 140 meq/L (ref 137–147)

## 2013-06-05 MED ORDER — CEFUROXIME AXETIL 500 MG PO TABS: 500.0000 mg | ORAL_TABLET | Freq: Two times a day (BID) | ORAL | Status: DC

## 2013-06-05 MED ORDER — AZITHROMYCIN 500 MG PO TABS: 500.0000 mg | ORAL_TABLET | Freq: Every day | ORAL | Status: DC

## 2013-06-05 MED ORDER — PREDNISONE 10 MG PO TABS: ORAL_TABLET | ORAL | Status: DC

## 2013-06-05 MED ORDER — DM-GUAIFENESIN ER 30-600 MG PO TB12: 1.0000 | ORAL_TABLET | Freq: Two times a day (BID) | ORAL | Status: DC

## 2013-06-05 NOTE — Discharge Instructions (Signed)
Chronic Obstructive Pulmonary Disease  Chronic obstructive pulmonary disease (COPD) is a common lung condition in which airflow from the lungs is limited. COPD is a general term that can be used to describe many different lung problems that limit airflow, including both chronic bronchitis and emphysema.  If you have COPD, your lung function will probably never return to normal, but there are measures you can take to improve lung function and make yourself feel better.   CAUSES   · Smoking (common).    · Exposure to secondhand smoke.    · Genetic problems.  · Chronic inflammatory lung diseases or recurrent infections.  SYMPTOMS   · Shortness of breath, especially with physical activity.    · Deep, persistent (chronic) cough with a large amount of thick mucus.    · Wheezing.    · Rapid breaths (tachypnea).    · Gray or bluish discoloration (cyanosis) of the skin, especially in fingers, toes, or lips.    · Fatigue.    · Weight loss.    · Frequent infections or episodes when breathing symptoms become much worse (exacerbations).    · Chest tightness.  DIAGNOSIS   Your healthcare provider will take a medical history and perform a physical examination to make the initial diagnosis.  Additional tests for COPD may include:   · Lung (pulmonary) function tests.  · Chest X-ray.  · CT scan.  · Blood tests.  TREATMENT   Treatment available to help you feel better when you have COPD include:   · Inhaler and nebulizer medicines. These help manage the symptoms of COPD and make your breathing more comfortable  · Supplemental oxygen. Supplemental oxygen is only helpful if you have a low oxygen level in your blood.    · Exercise and physical activity. These are beneficial for nearly all people with COPD. Some people may also benefit from a pulmonary rehabilitation program.  HOME CARE INSTRUCTIONS   · Take all medicines (inhaled or pills) as directed by your health care provider.  · Only take over-the-counter or prescription medicines  for pain, fever, or discomfort as directed by your health care provider.    · Avoid over-the-counter medicines or cough syrups that dry up your airway (such as antihistamines) and slow down the elimination of secretions unless instructed otherwise by your healthcare provider.    · If you are a smoker, the most important thing that you can do is stop smoking. Continuing to smoke will cause further lung damage and breathing trouble. Ask your health care provider for help with quitting smoking. He or she can direct you to community resources or hospitals that provide support.  · Avoid exposure to irritants such as smoke, chemicals, and fumes that aggravate your breathing.  · Use oxygen therapy and pulmonary rehabilitation if directed by your health care provider. If you require home oxygen therapy, ask your healthcare provider whether you should purchase a pulse oximeter to measure your oxygen level at home.    · Avoid contact with individuals who have a contagious illness.  · Avoid extreme temperature and humidity changes.  · Eat healthy foods. Eating smaller, more frequent meals and resting before meals may help you maintain your strength.  · Stay active, but balance activity with periods of rest. Exercise and physical activity will help you maintain your ability to do things you want to do.  · Preventing infection and hospitalization is very important when you have COPD. Make sure to receive all the vaccines your health care provider recommends, especially the pneumococcal and influenza vaccines. Ask your healthcare provider whether you   need a pneumonia vaccine.  · Learn and use relaxation techniques to manage stress.  · Learn and use controlled breathing techniques as directed by your health care provider. Controlled breathing techniques include:    · Pursed lip breathing. Start by breathing in (inhaling) through your nose for 1 second. Then, purse your lips as if you were going to whistle and breathe out (exhale)  through the pursed lips for 2 seconds.    · Diaphragmatic breathing. Start by putting one hand on your abdomen just above your waist. Inhale slowly through your nose. The hand on your abdomen should move out. Then purse your lips and exhale slowly. You should be able to feel the hand on your abdomen moving in as you exhale.    · Learn and use controlled coughing to clear mucus from your lungs. Controlled coughing is a series of short, progressive coughs. The steps of controlled coughing are:    1. Lean your head slightly forward.    2. Breathe in deeply using diaphragmatic breathing.    3. Try to hold your breath for 3 seconds.    4. Keep your mouth slightly open while coughing twice.    5. Spit any mucus out into a tissue.    6. Rest and repeat the steps once or twice as needed.  SEEK MEDICAL CARE IF:   · You are coughing up more mucus than usual.    · There is a change in the color or thickness of your mucus.    · Your breathing is more labored than usual.    · Your breathing is faster than usual.    SEEK IMMEDIATE MEDICAL CARE IF:   · You have shortness of breath while you are resting.    · You have shortness of breath that prevents you from:  · Being able to talk.    · Performing your usual physical activities.    · You have chest pain lasting longer than 5 minutes.    · Your skin color is more cyanotic than usual.  · You measure low oxygen saturations for longer than 5 minutes with a pulse oximeter.  MAKE SURE YOU:   · Understand these instructions.  · Will watch your condition.  · Will get help right away if you are not doing well or get worse.  Document Released: 01/07/2005 Document Revised: 01/18/2013 Document Reviewed: 11/24/2012  ExitCare® Patient Information ©2014 [employer].

## 2013-06-05 NOTE — Discharge Summary (Signed)
Physician Discharge Summary  LOGAN BALTIMORE QBH:[id number] DOB: [birth year] DOA: 06/03/2013  PCP: Sallee Lange, MD  Admit date: 06/03/2013 Discharge date: 06/05/2013  Time spent: 35 minutes  Recommendations for Outpatient Follow-up:  1. Follow up with primary care doctor in 2 weeks 2. Follow up with [facility] in august as previously scheduled 3. Consider further evaluation/treatment of chronic tremors as outpatient 4. Repeat bmet when seen by primary care doctor to evaluate renal function  Discharge Diagnoses:  Principal Problem:   COPD exacerbation Active Problems:   HYPERTENSION   CKD (chronic kidney disease), stage III   Lung cancer   SOB (shortness of breath)   Acute respiratory failure with hypoxia Acute bronchitis  Discharge Condition: improved  Diet recommendation: low salt  Filed Weights   06/03/13 2200 06/04/13 0502 06/05/13 0552  Weight: 78.472 kg (173 lb) 77.4 kg (170 lb 10.2 oz) 75.2 kg (165 lb 12.6 oz)    History of present illness:  80 yo male h/o copd, lung cancer lul treated at [place] with chemo 2 years ago, ckd comes in with several days of worsening sob. No fevers. Coughing a lot. No hemoptysis. No n/v/d. Some general malaise and nasal congestion. Wheezing more than normal. No le edema or swelling. No cp. Pt states that he had a ct scan of his lungs last week for his cancer f/u at [place] which was "normal". hes been told that all of his cancer is gone.   Hospital Course:  This patient was admitted with shortness of breath. He has known COPD as well as history of lung cancer with left upper lobe opacity. The patient is followed at [facility] and has undergone radiation treatments in the past. Records from his most recent visit on 05/18/13 were reviewed. Patient had a CT scan of his chest done at that time which showed persistent left upper lobe opacity. It was felt this was related to atelectasis versus radiation changes. Recommendations were for repeat CT the  chest in 6 months. His current admission was likely related to an acute bronchitis which exacerbated his COPD. He was started on steroids, antibiotics and nebulizer treatments. The patient has significantly improved, he is not wheezing. He is ambulating the halls without difficulty and is ready for discharge home. He will be started on a prednisone taper as well as oral antibiotics.  Procedures:    Consultations:    Discharge Exam: Filed Vitals:   06/05/13 0543  BP: 134/64  Pulse: 75  Temp: 97.8 F (36.6 C)  Resp: 20    General: NAD Cardiovascular: S1, S2 RRR Respiratory: CTA B  Discharge Instructions  Discharge Orders   Future Appointments Provider Department Dept Phone   08/03/2013 9:30 AM Kathyrn Drown, MD [place] [phone]   Future Orders Complete By Expires   Call MD for:  difficulty breathing, headache or visual disturbances  As directed    Call MD for:  temperature >100.4  As directed    Diet - low sodium heart healthy  As directed    Increase activity slowly  As directed        Medication List         albuterol 108 (90 BASE) MCG/ACT inhaler  Commonly known as:  PROVENTIL HFA;VENTOLIN HFA  Inhale 2 puffs into the lungs every 6 (six) hours as needed for shortness of breath.     aspirin 81 MG chewable tablet  Commonly known as:  ASPIRIN CHILDRENS  Chew 1 tablet (81 mg total) by mouth  daily.     atorvastatin 40 MG tablet  Commonly known as:  LIPITOR  Take 1 tablet (40 mg total) by mouth daily.     azithromycin 500 MG tablet  Commonly known as:  ZITHROMAX  Take 1 tablet (500 mg total) by mouth daily.     budesonide-formoterol 160-4.5 MCG/ACT inhaler  Commonly known as:  SYMBICORT  Inhale 1 puff into the lungs 2 (two) times daily.     cefUROXime 500 MG tablet  Commonly known as:  CEFTIN  Take 1 tablet (500 mg total) by mouth 2 (two) times daily with a meal.     dextromethorphan-guaiFENesin 30-600 MG per 12 hr tablet  Commonly  known as:  MUCINEX DM  Take 1 tablet by mouth 2 (two) times daily.     diltiazem 180 MG 24 hr capsule  Commonly known as:  CARDIZEM CD  Take 1 capsule (180 mg total) by mouth daily.     metoprolol 50 MG tablet  Commonly known as:  LOPRESSOR  Take 50 mg by mouth daily.     multivitamin with minerals Tabs tablet  Take 1 tablet by mouth daily.     nitroGLYCERIN 0.4 MG SL tablet  Commonly known as:  NITROSTAT  Place 1 tablet (0.4 mg total) under the tongue every 5 (five) minutes as needed for chest pain.     potassium chloride 10 MEQ tablet  Commonly known as:  K-DUR  Take 10 mEq by mouth 2 (two) times daily.     predniSONE 10 MG tablet  Commonly known as:  DELTASONE  Take 40mg  po daily for 2 days then 30mg  po daily for 2 days then 20mg  po daily for 2 days then 10mg  po daily for 2 days then stop     terazosin 5 MG capsule  Commonly known as:  HYTRIN  Take 5 mg by mouth at bedtime.     tetrahydrozoline-zinc 0.05-0.25 % ophthalmic solution  Commonly known as:  VISINE-AC  Place 1 drop into both eyes daily.     torsemide 20 MG tablet  Commonly known as:  DEMADEX  Take 20 mg by mouth 2 (two) times daily.       Allergies  Allergen Reactions  . Beta Adrenergic Blockers Hives and Swelling  . Levaquin [Levofloxacin In D5w] Nausea And Vomiting  . Penicillins Hives and Swelling  . Sulfonamide Derivatives Hives and Swelling       Follow-up Information   Follow up with LUKING,SCOTT, MD. Schedule an appointment as soon as possible for a visit in 2 weeks.   Specialty:  Family Medicine   Contact information:   [address] [place] [address] [phone]       Follow up with [facility] as scheduled.       The results of significant diagnostics from this hospitalization (including imaging, microbiology, ancillary and laboratory) are listed below for reference.    Significant Diagnostic Studies: Dg Chest 2 View  06/03/2013   CLINICAL DATA:  Wheezing and  shortness of breath. History of lung cancer.  EXAM: CHEST  2 VIEW  COMPARISON:  CT of the chest performed 08/16/2012, and chest radiograph from 06/20/2012  FINDINGS: There is persistent dense opacification at the left lung apex. Given its stability from recent prior studies, this may reflect post therapy changes and chronic postobstructive consolidation. However, as before, recurrent malignancy cannot be excluded. A small right pleural effusion is suspected. No pneumothorax is seen. The lungs are otherwise clear.  The heart  remains normal in size. The patient is status post median sternotomy. No acute osseous abnormalities are identified.  IMPRESSION: Persistent dense opacification of the left lung apex is stable from prior studies. As before, this may reflect posttherapy changes and chronic postobstructive consolidation, though recurrent malignancy again cannot be excluded. Suspect small right pleural effusion. Lungs otherwise clear.   Electronically Signed   By: Garald Balding M.D.   On: 06/03/2013 23:32    Microbiology: No results found for this or any previous visit (from the past 240 hour(s)).   Labs: Basic Metabolic Panel:  Recent Labs Lab 06/03/13 2252 06/04/13 0448 06/05/13 0526  NA 138 137 140  K 4.1 4.0 4.6  CL 100 100 103  CO2 25 25 26   GLUCOSE 123* 175* 133*  BUN 32* 32* 47*  CREATININE 1.97* 1.96* 2.52*  CALCIUM 9.1 9.2 9.2   Liver Function Tests:  Recent Labs Lab 06/03/13 2252  AST 20  ALT 11  ALKPHOS 100  BILITOT 0.5  PROT 6.7  ALBUMIN 3.6   No results found for this basename: LIPASE, AMYLASE,  in the last 168 hours No results found for this basename: AMMONIA,  in the last 168 hours CBC:  Recent Labs Lab 06/03/13 2252 06/04/13 0449 06/05/13 0526  WBC 8.1 5.6 11.2*  NEUTROABS 3.6  --   --   HGB 10.5* 9.8* 9.3*  HCT 31.0* 29.4* 27.9*  MCV 97.2 96.7 95.5  PLT 174 166 157   Cardiac Enzymes:  Recent Labs Lab 06/03/13 2252 06/04/13 0448 06/04/13 1055  06/04/13 1622  TROPONINI <0.30 <0.30 <0.30 <0.30   BNP: BNP (last 3 results) No results found for this basename: PROBNP,  in the last 8760 hours CBG:  Recent Labs Lab 06/04/13 0803 06/04/13 1106  GLUCAP 167* 190*       Signed:  MEMON,JEHANZEB  [facility] 06/05/2013, 11:35 AM

## 2013-06-14 ENCOUNTER — Other Ambulatory Visit: Payer: Self-pay | Admitting: Family Medicine

## 2013-06-15 NOTE — Care Management Note (Signed)
UR completed 

## 2013-06-19 ENCOUNTER — Encounter: Payer: Self-pay | Admitting: Family Medicine

## 2013-06-19 ENCOUNTER — Ambulatory Visit (INDEPENDENT_AMBULATORY_CARE_PROVIDER_SITE_OTHER): Payer: Medicare Other | Admitting: Family Medicine

## 2013-06-19 VITALS — BP 100/60 | Ht 66.0 in | Wt 167.0 lb

## 2013-06-19 DIAGNOSIS — R7303 Prediabetes: Secondary | ICD-10-CM

## 2013-06-19 DIAGNOSIS — G252 Other specified forms of tremor: Secondary | ICD-10-CM | POA: Diagnosis not present

## 2013-06-19 DIAGNOSIS — N183 Chronic kidney disease, stage 3 unspecified: Secondary | ICD-10-CM

## 2013-06-19 DIAGNOSIS — G250 Essential tremor: Secondary | ICD-10-CM | POA: Diagnosis not present

## 2013-06-19 DIAGNOSIS — I10 Essential (primary) hypertension: Secondary | ICD-10-CM

## 2013-06-19 DIAGNOSIS — R7309 Other abnormal glucose: Secondary | ICD-10-CM

## 2013-06-19 DIAGNOSIS — J449 Chronic obstructive pulmonary disease, unspecified: Secondary | ICD-10-CM | POA: Diagnosis not present

## 2013-06-19 MED ORDER — DILTIAZEM HCL ER COATED BEADS 120 MG PO CP24
ORAL_CAPSULE | ORAL | Status: DC
Start: 2013-06-19 — End: 2013-10-21

## 2013-06-19 MED ORDER — PRIMIDONE 50 MG PO TABS: 25.0000 mg | ORAL_TABLET | Freq: Three times a day (TID) | ORAL | Status: DC

## 2013-06-19 NOTE — Patient Instructions (Signed)
Start with 1/2 tablet at bedtime for 5 days then 1/2 twice a day for 5 days then 1/2 three times a day for the tremor.  If it helps we can increase the dose gradually just let us know  If needing a 90 day script let us know

## 2013-06-19 NOTE — Progress Notes (Signed)
   Subjective:    Patient ID: [name], male    DOB: 01/16/1922, 80 y.o.   MRN: [id number]  HPI Patient is here today for a f/u from the ED on 2/21. Patient does relate severe tremors lately he relates it's very difficult for him to hold a cup of coffee because of this. He denies any medications causing problems. Patient states while he is in the hospital they told him the left upper lung did not look right but he was told by [facility] that it is scar tissue. The notes from [place] were looked at and it does in fact appear to be scar tissue hopefully his cancers stays in remission.  He denies being dizzy or lightheaded no passing out recently  He went to the hospital d/t COPD. hospital notes were reviewed with the patient.  He states he feels better now and has no questions/concerns.    Review of Systems  Constitutional: Negative for activity change, appetite change and fatigue.  HENT: Negative for congestion and rhinorrhea.   Respiratory: Positive for cough and shortness of breath (only with excessive activity). Negative for choking and chest tightness.   Cardiovascular: Negative for chest pain.  Gastrointestinal: Negative for abdominal pain.  Endocrine: Negative for polydipsia and polyphagia.  Neurological: Negative for weakness.  Psychiatric/Behavioral: Negative for confusion.       Objective:   Physical Exam  Vitals reviewed. Constitutional: He appears well-nourished. No distress.  Cardiovascular: Normal rate, regular rhythm and normal heart sounds.   No murmur heard. Pulmonary/Chest: Effort normal and breath sounds normal. No respiratory distress.  Musculoskeletal: He exhibits no edema.  Fairly severe tremor noted  Lymphadenopathy:    He has no cervical adenopathy.  Neurological: He is alert.  Psychiatric: His behavior is normal.          Assessment & Plan:  #1 benign essential tremor-Mysoline 50 mg titrate up to a half tablet 3 times daily to help bring this under  control may gradually increase dose depending on how well he tolerates it. Followup again in 3 months #2 COPD recent hospitalization back to baseline overall doing well no need for other intervention currently continue standard medicines #3 he sees [facility] pulmonology every 6 months for lung cancer in remission currently #4 has history hypertension but his blood pressure today is 100/60 reduce diltiazem from 180 mg down now use 120 mg daily.

## 2013-06-20 LAB — HEMOGLOBIN A1C
Hgb A1c MFr Bld: 6.1 % — ABNORMAL HIGH (ref <5.7)
MEAN PLASMA GLUCOSE: 128 mg/dL — AB (ref <117)

## 2013-06-20 LAB — BASIC METABOLIC PANEL
BUN: 32 mg/dL — AB (ref 6–23)
CHLORIDE: 108 meq/L (ref 96–112)
CO2: 26 meq/L (ref 19–32)
Calcium: 8.5 mg/dL (ref 8.4–10.5)
Creat: 1.84 mg/dL — ABNORMAL HIGH (ref 0.50–1.35)
Glucose, Bld: 96 mg/dL (ref 70–99)
POTASSIUM: 4.2 meq/L (ref 3.5–5.3)
Sodium: 142 mEq/L (ref 135–145)

## 2013-06-21 ENCOUNTER — Encounter: Payer: Self-pay | Admitting: Family Medicine

## 2013-06-22 IMAGING — CR DG CHEST 2V
3 series · 3 of 3 positions shown · non-contrast
Comparison: 02/11/2012

CLINICAL DATA: Fever, sore throat

CHEST - 2 VIEW

[view not recorded (1 of 3)]
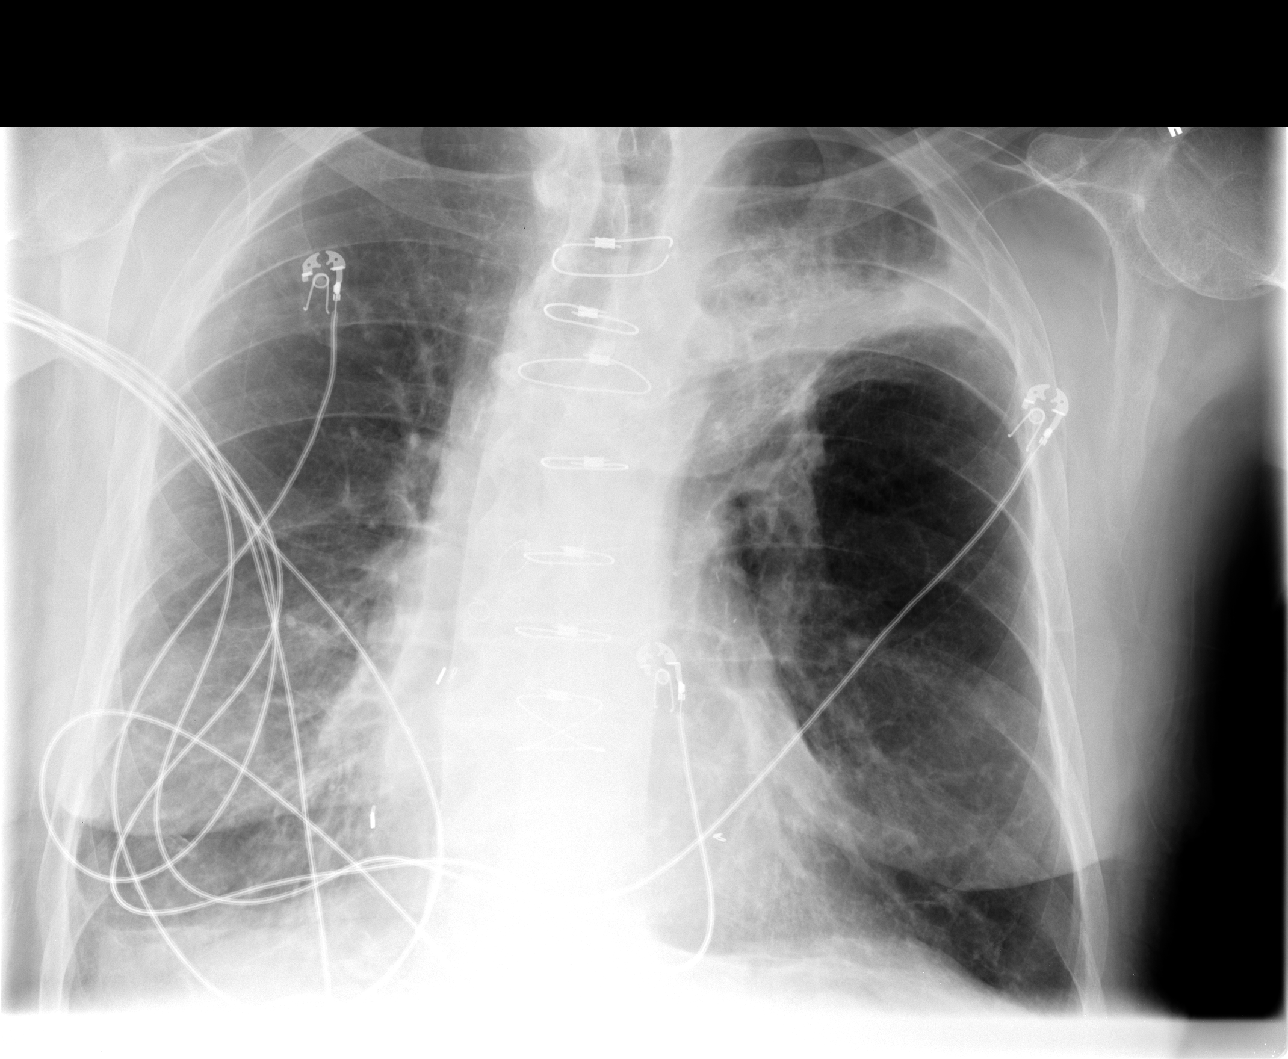

[view not recorded (2 of 3)]
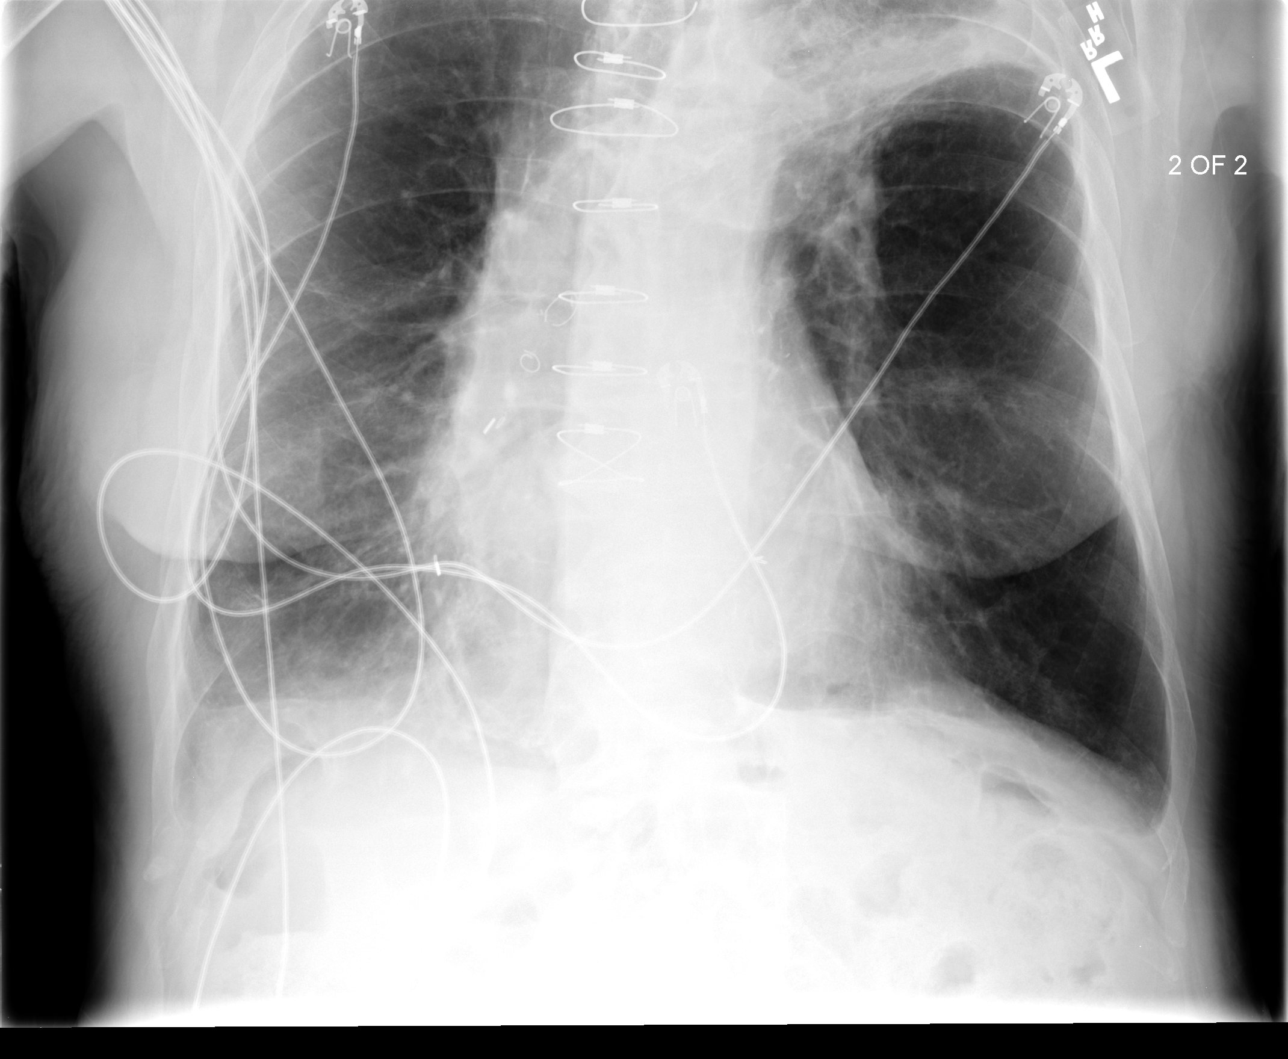

[view not recorded (3 of 3)]
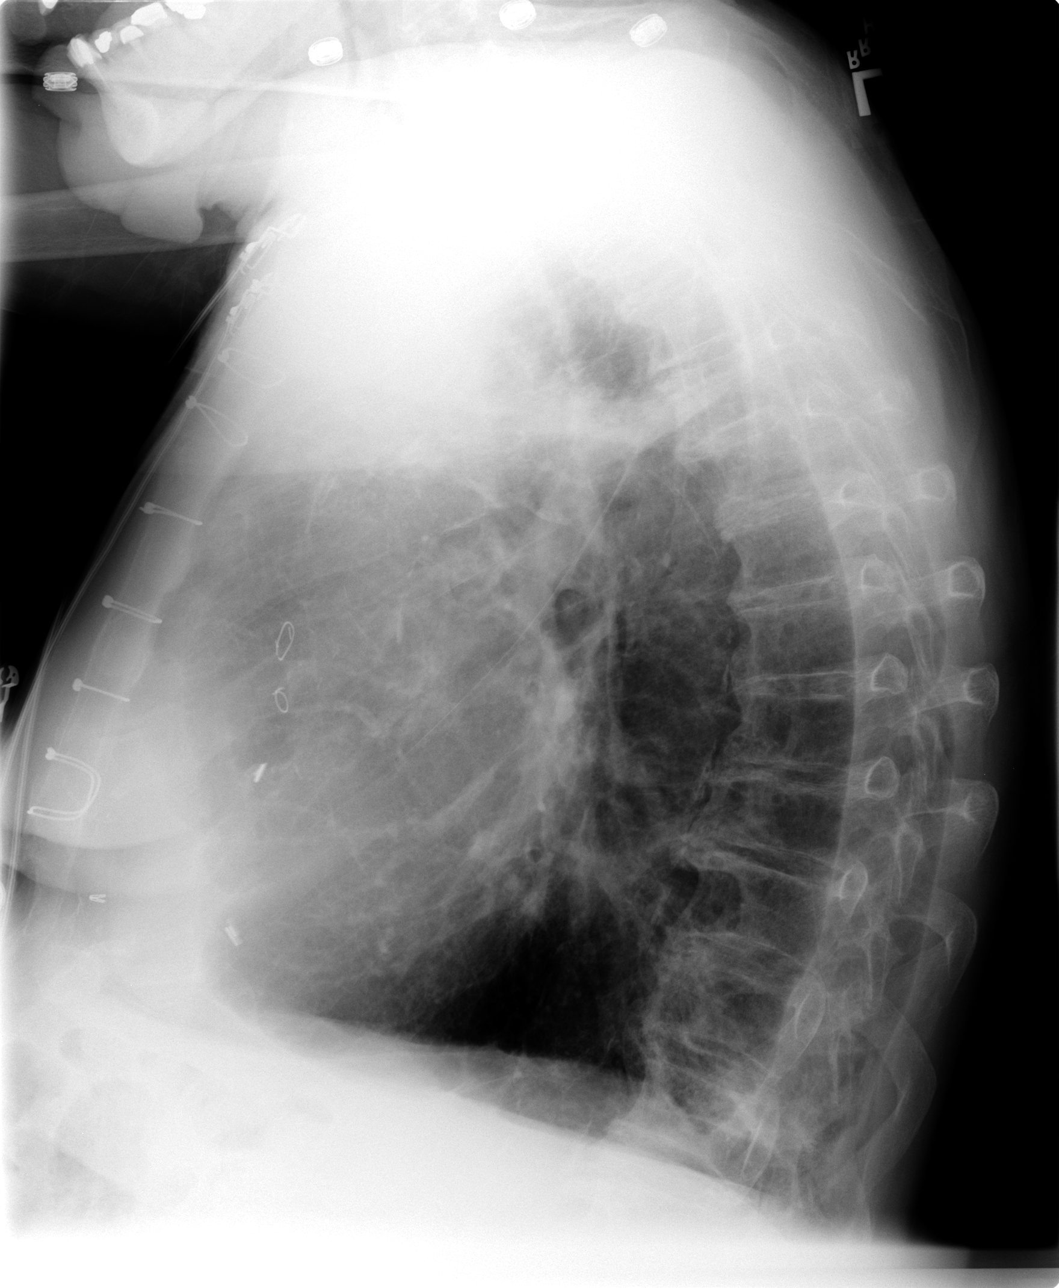

[3 of 3 positions shown; findings below may reference images not displayed]

FINDINGS: Cardiac size is stable.  Status post CABG.  No pulmonary
edema.  There is triangular shaped consolidation in the left upper
lobe.  Findings are highly suspicious for recurrent tumor or
associated postobstructive pneumonia.  Further evaluation with CT
scan of the chest with IV contrast is recommended.
IMPRESSION: There is triangular shaped consolidation in the left upper lobe.
Findings are highly suspicious for recurrent tumor or associated
postobstructive pneumonia.  Further evaluation with CT scan of the
chest with IV contrast is recommended.

## 2013-07-13 ENCOUNTER — Ambulatory Visit (INDEPENDENT_AMBULATORY_CARE_PROVIDER_SITE_OTHER): Payer: Medicare Other | Admitting: Family Medicine

## 2013-07-13 ENCOUNTER — Encounter: Payer: Self-pay | Admitting: Family Medicine

## 2013-07-13 VITALS — BP 100/56 | Temp 98.4°F | Ht 67.0 in | Wt 171.0 lb

## 2013-07-13 DIAGNOSIS — J441 Chronic obstructive pulmonary disease with (acute) exacerbation: Secondary | ICD-10-CM

## 2013-07-13 MED ORDER — DOXYCYCLINE HYCLATE 100 MG PO TABS: 100.0000 mg | ORAL_TABLET | Freq: Two times a day (BID) | ORAL | Status: DC

## 2013-07-13 NOTE — Progress Notes (Signed)
   Subjective:    Patient ID: [name], male    DOB: 02-23-1922, 80 y.o.   MRN: [id number]  Cough This is a new problem. The current episode started 1 to 4 weeks ago. Associated symptoms comments: Runny nose, hoarseness, .    History of known COPD. Possible low-grade fever. Cough now productive.  Notes some wheeziness at times. Has been using inhaler off-and-on.  Appetite fair. Energy level down somewhat.  Review of Systems  Respiratory: Positive for cough.    no vomiting no diarrhea no rash ROS otherwise negative     Objective:   Physical Exam  Alert hydration good. Vital stable. H&T mom his congestion frontal neck supple. Lungs positive rhonchi positive wheezes no tachypnea no crackles heart rare rhythm.      Assessment & Plan:  Impression exacerbation of COPD plan Doxy 100 twice a day 10 days. Ventolin when necessary. Symptomatic care discussed. WSL

## 2013-07-16 IMAGING — CR DG CHEST 2V
3 series · 3 of 3 positions shown · non-contrast
Comparison: Chest x-ray 05/27/2012.

CLINICAL DATA: Follow-up evaluation of pneumonia.

CHEST - 2 VIEW

[view not recorded (1 of 3)]
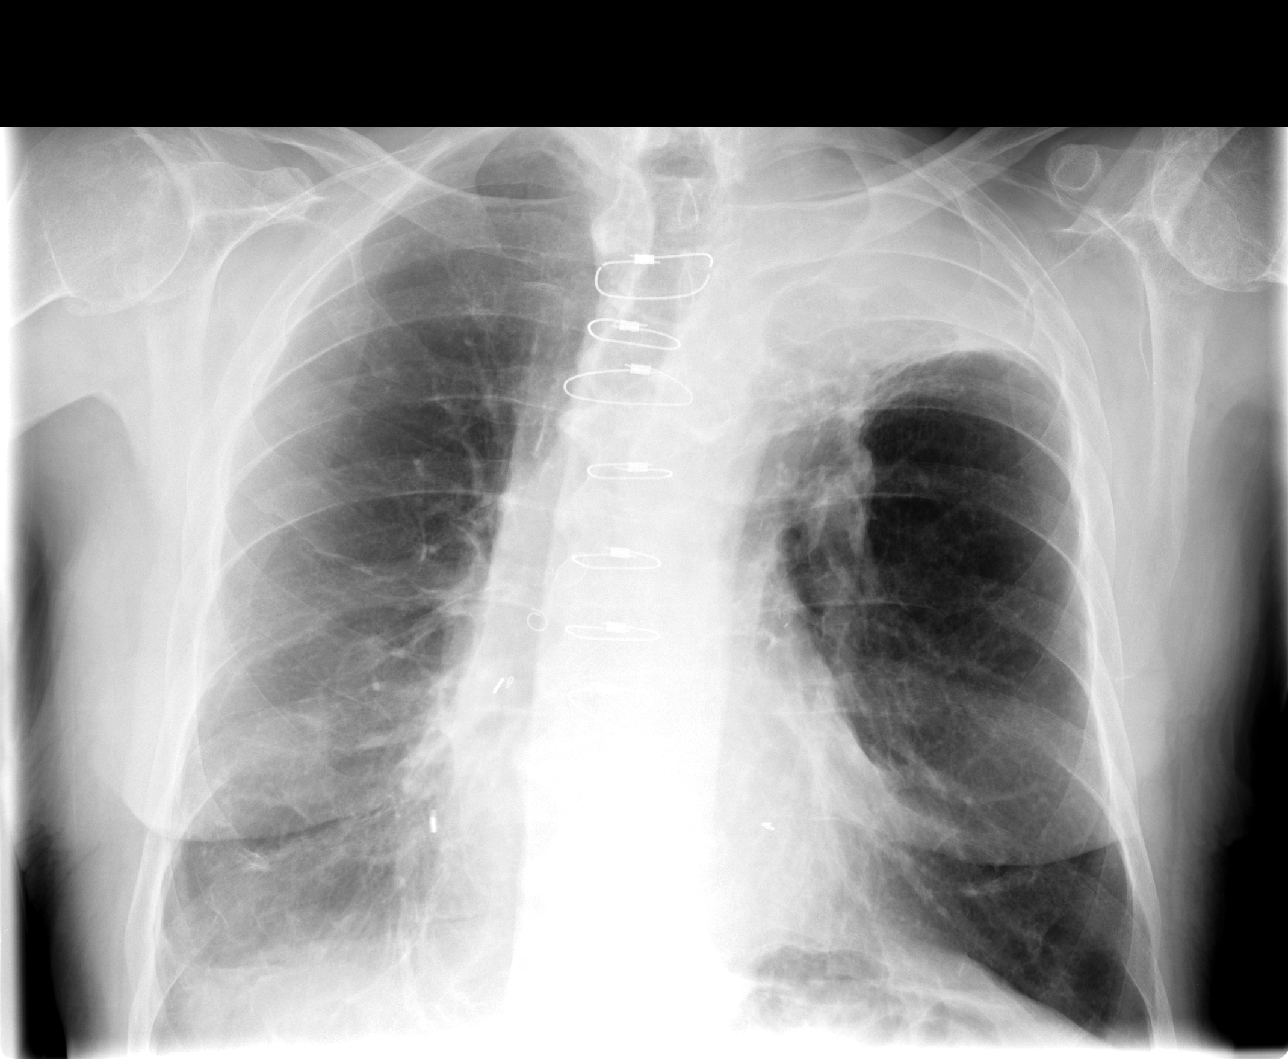

[view not recorded (2 of 3)]
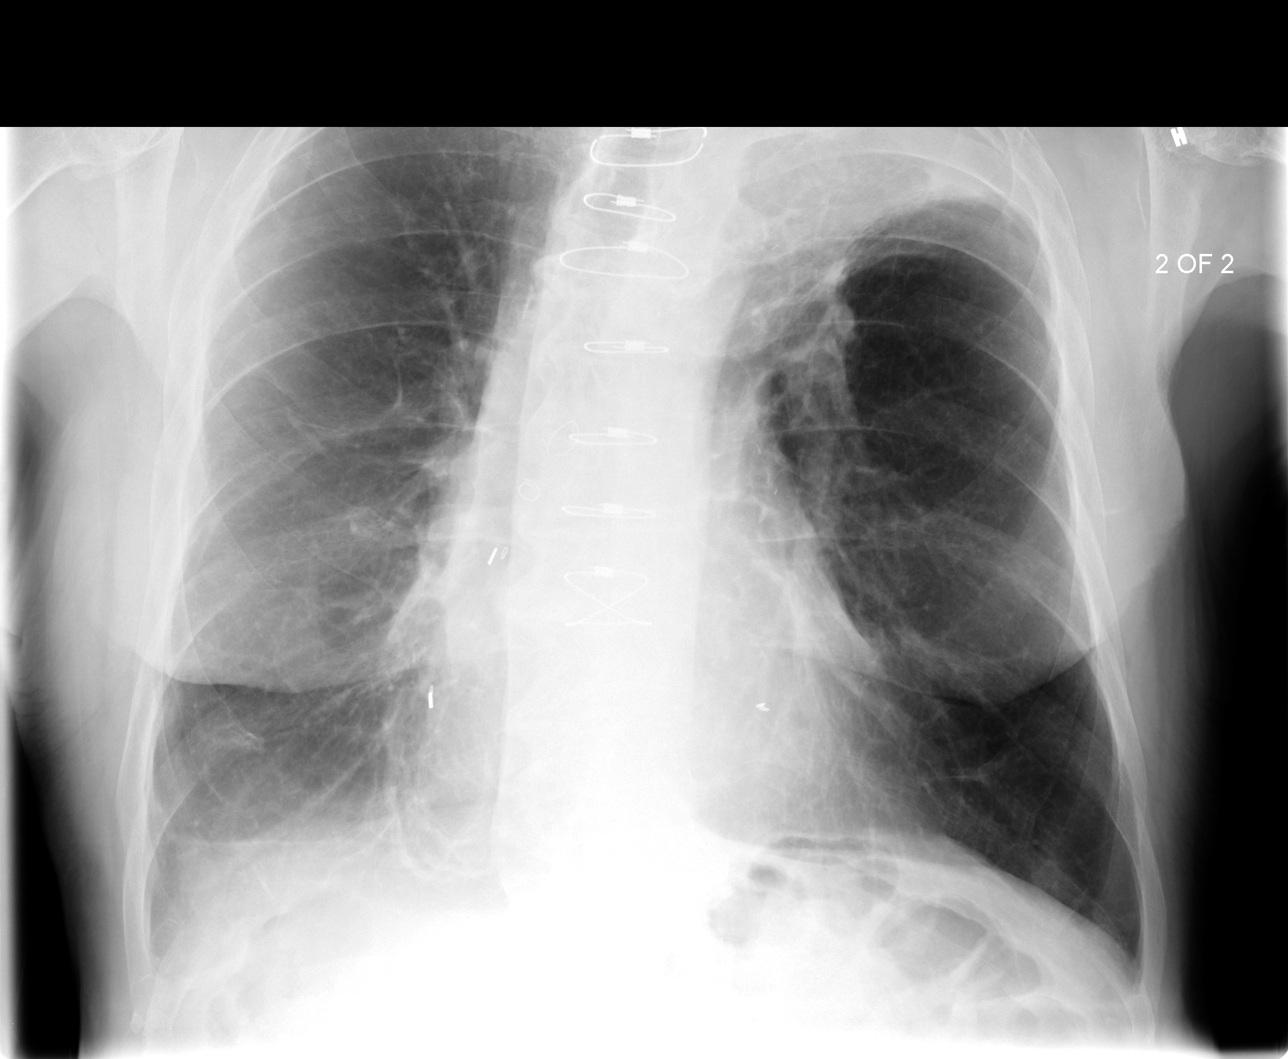

[view not recorded (3 of 3)]
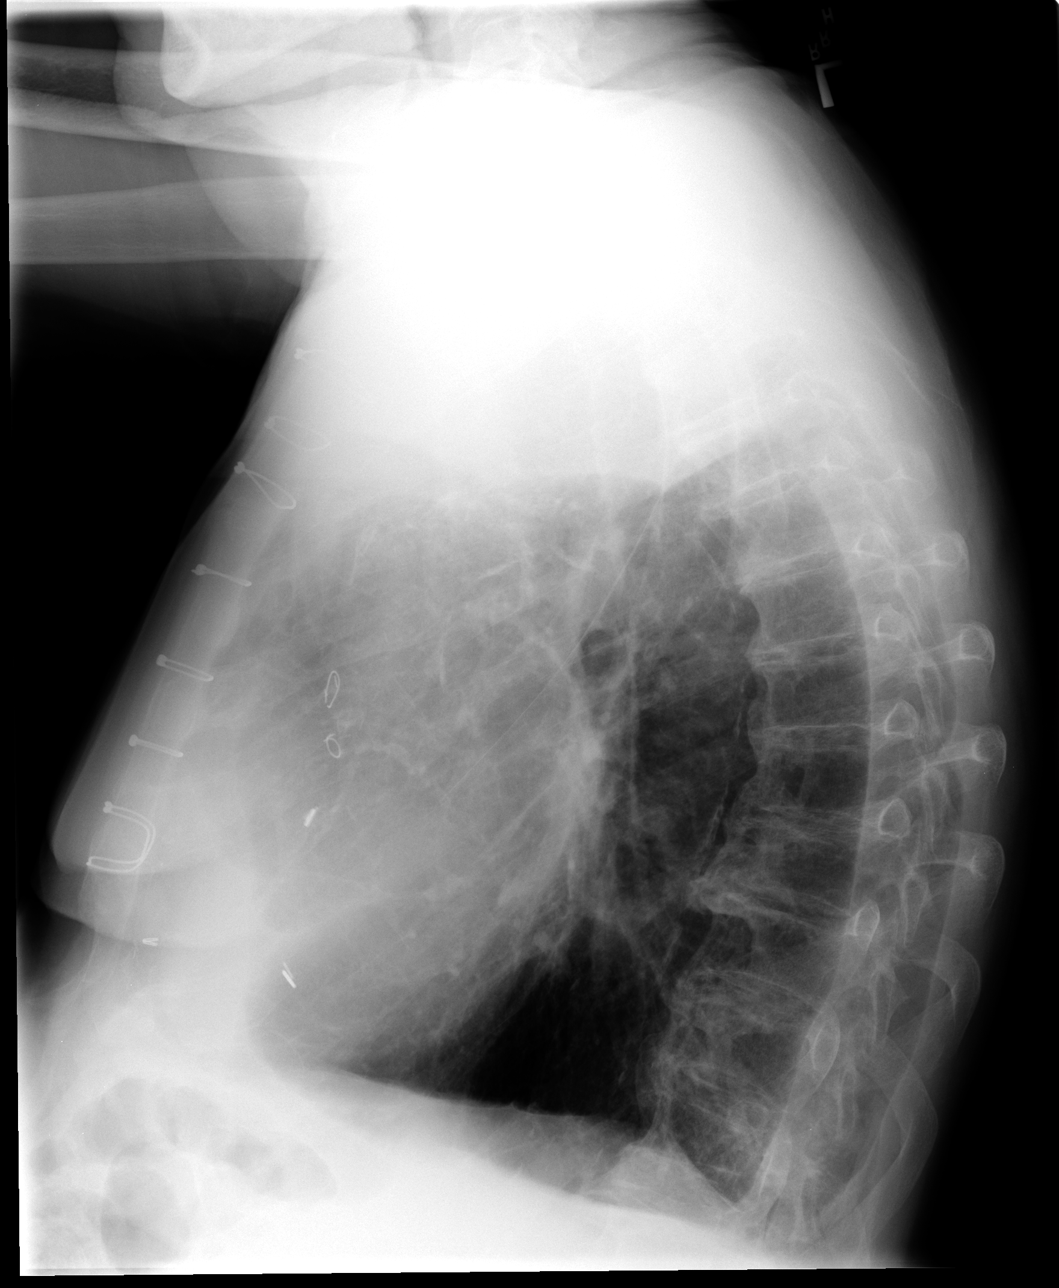

[3 of 3 positions shown; findings below may reference images not displayed]

FINDINGS: There is persistent opacification in the left apex,
increased compared to the prior study.  This is concerning for
residual air space consolidation, now with a loculated left apical
parapneumonic pleural effusion.  Poorly defined opacities in the
right base favored to reflect chronic atelectasis and/or scarring.
Possible trace right pleural effusion.  No evidence of pulmonary
edema.  Heart size is normal.  Status post median sternotomy for
CABG.  Atherosclerotic calcifications in the arch of the aorta.
IMPRESSION: 1.  Persistent opacity in the apex of the left upper lobe with new
left apical parapneumonic pleural effusion.  Given the left
suprahilar fullness and a prior history of primary malignancy in
this region, findings are highly suspicious for recurrent tumor
with postobstructive pneumonia.  Repeat CT scan of the thorax
(preferably with IV contrast) is recommended at this time to better
evaluate these findings.
2.  Probable trace right pleural effusion.
3.  Atherosclerosis.

These results will be called to the ordering clinician or
representative by the Radiologist Assistant, and communication
documented in the PACS Dashboard.

## 2013-07-20 ENCOUNTER — Ambulatory Visit (INDEPENDENT_AMBULATORY_CARE_PROVIDER_SITE_OTHER): Payer: Medicare Other | Admitting: Otolaryngology

## 2013-07-20 DIAGNOSIS — R490 Dysphonia: Secondary | ICD-10-CM | POA: Diagnosis not present

## 2013-07-20 DIAGNOSIS — K219 Gastro-esophageal reflux disease without esophagitis: Secondary | ICD-10-CM | POA: Diagnosis not present

## 2013-07-20 DIAGNOSIS — J310 Chronic rhinitis: Secondary | ICD-10-CM | POA: Diagnosis not present

## 2013-07-21 ENCOUNTER — Telehealth: Payer: Self-pay | Admitting: Family Medicine

## 2013-07-21 MED ORDER — POTASSIUM CHLORIDE CRYS ER 10 MEQ PO TBCR: EXTENDED_RELEASE_TABLET | ORAL | Status: DC

## 2013-07-21 MED ORDER — ALBUTEROL SULFATE HFA 108 (90 BASE) MCG/ACT IN AERS: 2.0000 | INHALATION_SPRAY | Freq: Four times a day (QID) | RESPIRATORY_TRACT | Status: DC | PRN

## 2013-07-21 NOTE — Telephone Encounter (Signed)
Pharmacy says that the ventolin that was sent in for patient was only a one month supply and they need a 3 month supply faxed in.  [facility]

## 2013-07-21 NOTE — Telephone Encounter (Signed)
Patient needs Rx for klor-con 90 day supply, ventolin 3 month supply.  [facility]

## 2013-07-21 NOTE — Telephone Encounter (Signed)
Clarified with pharmacy and they will dispense 90 day supply. Left message on voicemail notifying Ronalee Belts.

## 2013-07-21 NOTE — Telephone Encounter (Signed)
Medication sent to pharmacy. Ronalee Belts was notified.

## 2013-07-27 ENCOUNTER — Ambulatory Visit (HOSPITAL_COMMUNITY)
Admission: RE | Admit: 2013-07-27 | Discharge: 2013-07-27 | Disposition: A | Discharge status: Home / Self Care | Payer: Medicare Other | Source: Ambulatory Visit | Attending: Family Medicine | Admitting: Family Medicine

## 2013-07-27 ENCOUNTER — Ambulatory Visit (INDEPENDENT_AMBULATORY_CARE_PROVIDER_SITE_OTHER): Payer: Medicare Other | Admitting: Family Medicine

## 2013-07-27 ENCOUNTER — Encounter: Payer: Self-pay | Admitting: Family Medicine

## 2013-07-27 VITALS — BP 120/60 | Temp 97.9°F | Ht 67.0 in | Wt 169.4 lb

## 2013-07-27 DIAGNOSIS — Z87891 Personal history of nicotine dependence: Secondary | ICD-10-CM | POA: Insufficient documentation

## 2013-07-27 DIAGNOSIS — I2510 Atherosclerotic heart disease of native coronary artery without angina pectoris: Secondary | ICD-10-CM | POA: Diagnosis not present

## 2013-07-27 DIAGNOSIS — J449 Chronic obstructive pulmonary disease, unspecified: Secondary | ICD-10-CM | POA: Insufficient documentation

## 2013-07-27 DIAGNOSIS — R0602 Shortness of breath: Secondary | ICD-10-CM | POA: Insufficient documentation

## 2013-07-27 DIAGNOSIS — J441 Chronic obstructive pulmonary disease with (acute) exacerbation: Secondary | ICD-10-CM | POA: Diagnosis not present

## 2013-07-27 DIAGNOSIS — C3490 Malignant neoplasm of unspecified part of unspecified bronchus or lung: Secondary | ICD-10-CM

## 2013-07-27 DIAGNOSIS — Z85118 Personal history of other malignant neoplasm of bronchus and lung: Secondary | ICD-10-CM | POA: Diagnosis not present

## 2013-07-27 DIAGNOSIS — R05 Cough: Secondary | ICD-10-CM | POA: Insufficient documentation

## 2013-07-27 DIAGNOSIS — I10 Essential (primary) hypertension: Secondary | ICD-10-CM

## 2013-07-27 DIAGNOSIS — R059 Cough, unspecified: Secondary | ICD-10-CM | POA: Diagnosis not present

## 2013-07-27 DIAGNOSIS — J4489 Other specified chronic obstructive pulmonary disease: Secondary | ICD-10-CM | POA: Insufficient documentation

## 2013-07-27 MED ORDER — PREDNISONE 20 MG PO TABS: ORAL_TABLET | ORAL | Status: DC

## 2013-07-27 MED ORDER — CLINDAMYCIN HCL 150 MG PO CAPS: 150.0000 mg | ORAL_CAPSULE | Freq: Three times a day (TID) | ORAL | Status: DC

## 2013-07-27 NOTE — Progress Notes (Signed)
   Subjective:    Patient ID: [name], male    DOB: [birth year], 80 y.o.   MRN: [id number]  Cough This is a new problem. The current episode started more than 1 month ago. The problem has been gradually worsening. The cough is productive of sputum. Associated symptoms include shortness of breath. Nothing aggravates the symptoms. He has tried steroid inhaler (antibiotic) for the symptoms. The treatment provided no relief.  Patient states he has no other concerns at this time.   Patient also has had element of hoarseness. On further history he has artery been to the ear nose and throat Dr. He took a good look in his laryngeal region and found no major concerns. There was question of reflux the patient was started on medication for this.  History of a left lung abnormality which led to a followup scan by the [facility] specialist. They feel this did not represent true cancer.  Has noted more progressive wheezing of late. Short of breath with this at times.  Review of Systems  Respiratory: Positive for cough and shortness of breath.    no vomiting no diarrhea no rash ROS otherwise negative     Objective:   Physical Exam  Alert mild malaise. HEENT normal. Lungs element of wheezing auscultated right basilar crackles heart regular in rhythm.      Assessment & Plan:  Impression 1 persistent bronchitis with questionable right lower lobe pneumonia. Many medication allergies and in contraindications with known coronary artery disease.  left lung abnormality was no cancer as per [facility] specialist assessment. Nancy Fetter concerned about hoarseness for a month but with recent ENT evaluation. Unlikely serious etiology to hoarseness. Likely multifactorial. Also hoarseness comes and goes and not necessarily progressive plan trial of clindamycin. Add probiotics. Chest x-ray to rule out new infiltrative process. Cover with prednisone for reactive airway component and had albuterol regularly. Followup with [name] as  scheduled. WSL

## 2013-08-03 ENCOUNTER — Ambulatory Visit: Payer: Medicare Other | Admitting: Family Medicine

## 2013-08-04 ENCOUNTER — Encounter: Payer: Self-pay | Admitting: Family Medicine

## 2013-08-04 ENCOUNTER — Ambulatory Visit (INDEPENDENT_AMBULATORY_CARE_PROVIDER_SITE_OTHER): Payer: Medicare Other | Admitting: Family Medicine

## 2013-08-04 VITALS — BP 120/72 | Ht 67.0 in | Wt 169.2 lb

## 2013-08-04 DIAGNOSIS — J449 Chronic obstructive pulmonary disease, unspecified: Secondary | ICD-10-CM | POA: Diagnosis not present

## 2013-08-04 DIAGNOSIS — R490 Dysphonia: Secondary | ICD-10-CM | POA: Diagnosis not present

## 2013-08-04 NOTE — Progress Notes (Signed)
   Subjective:    Patient ID: [name], male    DOB: [birth year], 80 y.o.   MRN: [id number]  HPI  Patient arrives for a follow up on SOB. Patient completed a round of steroids and stated that his breathing is much better but he still doesn't have his voice back. Apparently the ENT look down his throat did not find any specific findings other than some aspects of swollen around the vocal cords and wanted him to come back in a couple months time. Review of Systems     Objective:   Physical Exam  His lungs are clear hoarseness is noted neck no masses extremities no edema skin warm dry      Assessment & Plan:  Hoarseness followup with ENT this visit will be in the next couple weeks son will give [place] an update what they find bile followup the patient in a couple months time COPD stable Seeing specialists for followup on lung cancer and August  If unilateral vocal cord paralysis he will need further testing possibly I did recommend allergy tablet daily.

## 2013-08-04 NOTE — Patient Instructions (Signed)
Take a daily allergy medicine  Choices: Loratadine 10 mg ( less expensive) ( Generic of Claritin)  Fexofenadine 180 mg ( stronger - the generic of Allegra)

## 2013-08-08 DIAGNOSIS — M79609 Pain in unspecified limb: Secondary | ICD-10-CM | POA: Diagnosis not present

## 2013-08-17 ENCOUNTER — Ambulatory Visit (INDEPENDENT_AMBULATORY_CARE_PROVIDER_SITE_OTHER): Payer: Medicare Other | Admitting: Otolaryngology

## 2013-08-17 DIAGNOSIS — R490 Dysphonia: Secondary | ICD-10-CM | POA: Diagnosis not present

## 2013-08-17 DIAGNOSIS — K219 Gastro-esophageal reflux disease without esophagitis: Secondary | ICD-10-CM | POA: Diagnosis not present

## 2013-08-22 ENCOUNTER — Telehealth: Payer: Self-pay | Admitting: Family Medicine

## 2013-08-22 MED ORDER — ATORVASTATIN CALCIUM 40 MG PO TABS: 40.0000 mg | ORAL_TABLET | Freq: Every day | ORAL | Status: DC

## 2013-08-22 MED ORDER — METOPROLOL TARTRATE 50 MG PO TABS: 50.0000 mg | ORAL_TABLET | Freq: Every day | ORAL | Status: DC

## 2013-08-22 MED ORDER — TERAZOSIN HCL 5 MG PO CAPS: 5.0000 mg | ORAL_CAPSULE | Freq: Every day | ORAL | Status: DC

## 2013-08-22 NOTE — Telephone Encounter (Signed)
Patient needs Rx for terazosin 5 mg, atorvastatin 40 mg, metoprolol 50 mg, diltiazem 120 mg, symbicort, and ventolin to [facility]. 3 month supply on all please.

## 2013-08-22 NOTE — Telephone Encounter (Signed)
Pt's son notified

## 2013-09-14 ENCOUNTER — Ambulatory Visit (INDEPENDENT_AMBULATORY_CARE_PROVIDER_SITE_OTHER): Payer: Medicare Other | Admitting: Otolaryngology

## 2013-09-14 DIAGNOSIS — K219 Gastro-esophageal reflux disease without esophagitis: Secondary | ICD-10-CM

## 2013-09-14 DIAGNOSIS — R490 Dysphonia: Secondary | ICD-10-CM

## 2013-09-14 DIAGNOSIS — J383 Other diseases of vocal cords: Secondary | ICD-10-CM

## 2013-09-15 ENCOUNTER — Other Ambulatory Visit (INDEPENDENT_AMBULATORY_CARE_PROVIDER_SITE_OTHER): Payer: Self-pay | Admitting: Otolaryngology

## 2013-09-15 DIAGNOSIS — R918 Other nonspecific abnormal finding of lung field: Secondary | ICD-10-CM

## 2013-09-19 ENCOUNTER — Encounter: Payer: Self-pay | Admitting: Family Medicine

## 2013-09-19 ENCOUNTER — Ambulatory Visit (INDEPENDENT_AMBULATORY_CARE_PROVIDER_SITE_OTHER): Payer: Medicare Other | Admitting: Family Medicine

## 2013-09-19 ENCOUNTER — Other Ambulatory Visit (INDEPENDENT_AMBULATORY_CARE_PROVIDER_SITE_OTHER): Payer: Self-pay | Admitting: Otolaryngology

## 2013-09-19 ENCOUNTER — Ambulatory Visit (HOSPITAL_COMMUNITY)
Admission: RE | Admit: 2013-09-19 | Discharge: 2013-09-19 | Disposition: A | Discharge status: Home / Self Care | Payer: Medicare Other | Source: Ambulatory Visit | Attending: Otolaryngology | Admitting: Otolaryngology

## 2013-09-19 VITALS — BP 122/60 | Ht 67.0 in | Wt 167.0 lb

## 2013-09-19 DIAGNOSIS — E785 Hyperlipidemia, unspecified: Secondary | ICD-10-CM

## 2013-09-19 DIAGNOSIS — J9819 Other pulmonary collapse: Secondary | ICD-10-CM | POA: Insufficient documentation

## 2013-09-19 DIAGNOSIS — I10 Essential (primary) hypertension: Secondary | ICD-10-CM

## 2013-09-19 DIAGNOSIS — R7303 Prediabetes: Secondary | ICD-10-CM

## 2013-09-19 DIAGNOSIS — R918 Other nonspecific abnormal finding of lung field: Secondary | ICD-10-CM

## 2013-09-19 DIAGNOSIS — I2584 Coronary atherosclerosis due to calcified coronary lesion: Secondary | ICD-10-CM | POA: Insufficient documentation

## 2013-09-19 DIAGNOSIS — N183 Chronic kidney disease, stage 3 unspecified: Secondary | ICD-10-CM

## 2013-09-19 DIAGNOSIS — R7309 Other abnormal glucose: Secondary | ICD-10-CM | POA: Diagnosis not present

## 2013-09-19 DIAGNOSIS — J438 Other emphysema: Secondary | ICD-10-CM | POA: Diagnosis not present

## 2013-09-19 LAB — POCT I-STAT, CHEM 8
BUN: 33 mg/dL — ABNORMAL HIGH (ref 6–23)
Calcium, Ion: 1.24 mmol/L (ref 1.13–1.30)
Chloride: 102 mEq/L (ref 96–112)
Creatinine, Ser: 2.4 mg/dL — ABNORMAL HIGH (ref 0.50–1.35)
Glucose, Bld: 131 mg/dL — ABNORMAL HIGH (ref 70–99)
HEMATOCRIT: 31 % — AB (ref 39.0–52.0)
Hemoglobin: 10.5 g/dL — ABNORMAL LOW (ref 13.0–17.0)
POTASSIUM: 4.5 meq/L (ref 3.7–5.3)
Sodium: 146 mEq/L (ref 137–147)
TCO2: 27 mmol/L (ref 0–100)

## 2013-09-19 MED ORDER — NITROGLYCERIN 0.4 MG SL SUBL: 0.4000 mg | SUBLINGUAL_TABLET | SUBLINGUAL | Status: DC | PRN

## 2013-09-19 NOTE — Progress Notes (Signed)
   Subjective:    Patient ID: [name], male    DOB: April 04, 1922, 80 y.o.   MRN: [id number]  HPIFollow up on hoarseness. Has seen ENT. Doing a scan today. Then patient will be going to [facility] to see specialist.  Patient relates coughing hoarseness denies fever chills Refill on nitroglycerin. Patient states he has had his for about 5 years.  He relates he is trying to eat healthy. Try to stay physically active has severe COPD limits him. Also has history hypertension hyperlipidemia prediabetes. Patient does not smoke. Review of Systems  Constitutional: Negative for activity change, appetite change and fatigue.  HENT: Negative for congestion.   Respiratory: Negative for cough.   Cardiovascular: Negative for chest pain.  Gastrointestinal: Negative for abdominal pain.  Endocrine: Negative for polydipsia and polyphagia.  Neurological: Negative for weakness.  Psychiatric/Behavioral: Negative for confusion.       Objective:   Physical Exam  Vitals reviewed. Constitutional: He appears well-nourished. No distress.  Cardiovascular: Normal rate, regular rhythm and normal heart sounds.   No murmur heard. Pulmonary/Chest: Effort normal and breath sounds normal. No respiratory distress.  Musculoskeletal: He exhibits no edema.  Lymphadenopathy:    He has no cervical adenopathy.  Neurological: He is alert.  Psychiatric: His behavior is normal.          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 Will discuss results with his ENT after that  Hyperlipidemia continue medication check lab work Heart disease stable Hypertension stable Prediabetes watch diet check lab work Renal insufficiency check lab work

## 2013-09-21 DIAGNOSIS — R7309 Other abnormal glucose: Secondary | ICD-10-CM | POA: Diagnosis not present

## 2013-09-21 DIAGNOSIS — I10 Essential (primary) hypertension: Secondary | ICD-10-CM | POA: Diagnosis not present

## 2013-09-21 DIAGNOSIS — E785 Hyperlipidemia, unspecified: Secondary | ICD-10-CM | POA: Diagnosis not present

## 2013-09-22 LAB — LIPID PANEL
CHOLESTEROL: 149 mg/dL (ref 0–200)
HDL: 74 mg/dL (ref >39)
LDL Cholesterol: 62 mg/dL (ref 0–99)
TRIGLYCERIDES: 67 mg/dL (ref <150)
Total CHOL/HDL Ratio: 2 Ratio
VLDL: 13 mg/dL (ref 0–40)

## 2013-09-22 LAB — BASIC METABOLIC PANEL
BUN: 32 mg/dL — ABNORMAL HIGH (ref 6–23)
CO2: 26 mEq/L (ref 19–32)
CREATININE: 1.9 mg/dL — AB (ref 0.50–1.35)
Calcium: 9 mg/dL (ref 8.4–10.5)
Chloride: 110 mEq/L (ref 96–112)
Glucose, Bld: 103 mg/dL — ABNORMAL HIGH (ref 70–99)
POTASSIUM: 4.1 meq/L (ref 3.5–5.3)
Sodium: 145 mEq/L (ref 135–145)

## 2013-09-22 LAB — HEMOGLOBIN A1C
Hgb A1c MFr Bld: 6.2 % — ABNORMAL HIGH (ref <5.7)
Mean Plasma Glucose: 131 mg/dL — ABNORMAL HIGH (ref <117)

## 2013-09-22 LAB — HEPATIC FUNCTION PANEL
ALT: 12 U/L (ref 0–53)
AST: 20 U/L (ref 0–37)
Albumin: 3.8 g/dL (ref 3.5–5.2)
Alkaline Phosphatase: 99 U/L (ref 39–117)
Bilirubin, Direct: 0.1 mg/dL (ref 0.0–0.3)
Indirect Bilirubin: 0.5 mg/dL (ref 0.2–1.2)
TOTAL PROTEIN: 5.9 g/dL — AB (ref 6.0–8.3)
Total Bilirubin: 0.6 mg/dL (ref 0.2–1.2)

## 2013-09-22 LAB — MICROALBUMIN, URINE: MICROALB UR: 2.62 mg/dL — AB (ref 0.00–1.89)

## 2013-09-25 ENCOUNTER — Encounter: Payer: Self-pay | Admitting: Family Medicine

## 2013-10-03 DIAGNOSIS — M79609 Pain in unspecified limb: Secondary | ICD-10-CM | POA: Diagnosis not present

## 2013-10-03 DIAGNOSIS — B351 Tinea unguium: Secondary | ICD-10-CM | POA: Diagnosis not present

## 2013-10-03 DIAGNOSIS — Q828 Other specified congenital malformations of skin: Secondary | ICD-10-CM | POA: Diagnosis not present

## 2013-10-21 ENCOUNTER — Encounter (HOSPITAL_COMMUNITY): Payer: Self-pay | Admitting: Emergency Medicine

## 2013-10-21 ENCOUNTER — Emergency Department (HOSPITAL_COMMUNITY)
Admission: EM | Admit: 2013-10-21 | Discharge: 2013-10-21 | Disposition: A | Discharge status: Home / Self Care | Payer: Medicare Other | Attending: Emergency Medicine | Admitting: Emergency Medicine

## 2013-10-21 ENCOUNTER — Emergency Department (HOSPITAL_COMMUNITY): Payer: Medicare Other

## 2013-10-21 DIAGNOSIS — Z95810 Presence of automatic (implantable) cardiac defibrillator: Secondary | ICD-10-CM | POA: Insufficient documentation

## 2013-10-21 DIAGNOSIS — R55 Syncope and collapse: Secondary | ICD-10-CM | POA: Insufficient documentation

## 2013-10-21 DIAGNOSIS — Q638 Other specified congenital malformations of kidney: Secondary | ICD-10-CM | POA: Insufficient documentation

## 2013-10-21 DIAGNOSIS — I2510 Atherosclerotic heart disease of native coronary artery without angina pectoris: Secondary | ICD-10-CM | POA: Insufficient documentation

## 2013-10-21 DIAGNOSIS — Z85828 Personal history of other malignant neoplasm of skin: Secondary | ICD-10-CM | POA: Insufficient documentation

## 2013-10-21 DIAGNOSIS — I129 Hypertensive chronic kidney disease with stage 1 through stage 4 chronic kidney disease, or unspecified chronic kidney disease: Secondary | ICD-10-CM | POA: Diagnosis not present

## 2013-10-21 DIAGNOSIS — Z79899 Other long term (current) drug therapy: Secondary | ICD-10-CM | POA: Insufficient documentation

## 2013-10-21 DIAGNOSIS — N182 Chronic kidney disease, stage 2 (mild): Secondary | ICD-10-CM | POA: Insufficient documentation

## 2013-10-21 DIAGNOSIS — Z7982 Long term (current) use of aspirin: Secondary | ICD-10-CM | POA: Diagnosis not present

## 2013-10-21 DIAGNOSIS — Z87891 Personal history of nicotine dependence: Secondary | ICD-10-CM | POA: Insufficient documentation

## 2013-10-21 DIAGNOSIS — R42 Dizziness and giddiness: Secondary | ICD-10-CM | POA: Diagnosis not present

## 2013-10-21 DIAGNOSIS — Z8739 Personal history of other diseases of the musculoskeletal system and connective tissue: Secondary | ICD-10-CM | POA: Diagnosis not present

## 2013-10-21 DIAGNOSIS — Z8673 Personal history of transient ischemic attack (TIA), and cerebral infarction without residual deficits: Secondary | ICD-10-CM | POA: Insufficient documentation

## 2013-10-21 DIAGNOSIS — Z880 Allergy status to penicillin: Secondary | ICD-10-CM | POA: Insufficient documentation

## 2013-10-21 DIAGNOSIS — Z862 Personal history of diseases of the blood and blood-forming organs and certain disorders involving the immune mechanism: Secondary | ICD-10-CM | POA: Diagnosis not present

## 2013-10-21 DIAGNOSIS — J449 Chronic obstructive pulmonary disease, unspecified: Secondary | ICD-10-CM | POA: Diagnosis not present

## 2013-10-21 DIAGNOSIS — I10 Essential (primary) hypertension: Secondary | ICD-10-CM | POA: Diagnosis not present

## 2013-10-21 DIAGNOSIS — Z951 Presence of aortocoronary bypass graft: Secondary | ICD-10-CM | POA: Insufficient documentation

## 2013-10-21 DIAGNOSIS — R404 Transient alteration of awareness: Secondary | ICD-10-CM | POA: Diagnosis not present

## 2013-10-21 DIAGNOSIS — E785 Hyperlipidemia, unspecified: Secondary | ICD-10-CM | POA: Diagnosis not present

## 2013-10-21 DIAGNOSIS — Z85118 Personal history of other malignant neoplasm of bronchus and lung: Secondary | ICD-10-CM | POA: Insufficient documentation

## 2013-10-21 DIAGNOSIS — J4489 Other specified chronic obstructive pulmonary disease: Secondary | ICD-10-CM | POA: Insufficient documentation

## 2013-10-21 LAB — CBC WITH DIFFERENTIAL/PLATELET
Basophils Absolute: 0 10*3/uL (ref 0.0–0.1)
Basophils Relative: 1 % (ref 0–1)
Eosinophils Absolute: 0.2 10*3/uL (ref 0.0–0.7)
Eosinophils Relative: 3 % (ref 0–5)
HEMATOCRIT: 31.1 % — AB (ref 39.0–52.0)
HEMOGLOBIN: 10.3 g/dL — AB (ref 13.0–17.0)
LYMPHS ABS: 1.4 10*3/uL (ref 0.7–4.0)
LYMPHS PCT: 23 % (ref 12–46)
MCH: 31.8 pg (ref 26.0–34.0)
MCHC: 33.1 g/dL (ref 30.0–36.0)
MCV: 96 fL (ref 78.0–100.0)
MONO ABS: 0.8 10*3/uL (ref 0.1–1.0)
MONOS PCT: 12 % (ref 3–12)
NEUTROS ABS: 3.9 10*3/uL (ref 1.7–7.7)
Neutrophils Relative %: 61 % (ref 43–77)
Platelets: 165 10*3/uL (ref 150–400)
RBC: 3.24 MIL/uL — ABNORMAL LOW (ref 4.22–5.81)
RDW: 14.4 % (ref 11.5–15.5)
WBC: 6.3 10*3/uL (ref 4.0–10.5)

## 2013-10-21 LAB — COMPREHENSIVE METABOLIC PANEL
ALT: 14 U/L (ref 0–53)
AST: 21 U/L (ref 0–37)
Albumin: 3.5 g/dL (ref 3.5–5.2)
Alkaline Phosphatase: 110 U/L (ref 39–117)
Anion gap: 10 (ref 5–15)
BILIRUBIN TOTAL: 0.4 mg/dL (ref 0.3–1.2)
BUN: 25 mg/dL — AB (ref 6–23)
CO2: 27 meq/L (ref 19–32)
CREATININE: 2.04 mg/dL — AB (ref 0.50–1.35)
Calcium: 9.2 mg/dL (ref 8.4–10.5)
Chloride: 105 mEq/L (ref 96–112)
GFR calc Af Amer: 32 mL/min — ABNORMAL LOW (ref >90)
GFR, EST NON AFRICAN AMERICAN: 27 mL/min — AB (ref >90)
GLUCOSE: 135 mg/dL — AB (ref 70–99)
Potassium: 4.4 mEq/L (ref 3.7–5.3)
Sodium: 142 mEq/L (ref 137–147)
Total Protein: 6.3 g/dL (ref 6.0–8.3)

## 2013-10-21 LAB — URINALYSIS, ROUTINE W REFLEX MICROSCOPIC
Bilirubin Urine: NEGATIVE
Glucose, UA: NEGATIVE mg/dL
Hgb urine dipstick: NEGATIVE
KETONES UR: NEGATIVE mg/dL
LEUKOCYTES UA: NEGATIVE
Nitrite: NEGATIVE
PROTEIN: NEGATIVE mg/dL
Specific Gravity, Urine: 1.01 (ref 1.005–1.030)
Urobilinogen, UA: 0.2 mg/dL (ref 0.0–1.0)
pH: 6.5 (ref 5.0–8.0)

## 2013-10-21 LAB — TROPONIN I: Troponin I: <0.3 ng/mL (ref <0.30)

## 2013-10-21 NOTE — ED Provider Notes (Signed)
CSN: [id number]     Arrival date & time 10/21/13  1548 History  This chart was scribed for Maudry Diego, MD by Girtha Hake, ED Scribe. The patient was seen in [place]. The patient's care was started at 4:19 PM.   Chief Complaint  Patient presents with  . Near Syncope     Patient is a 80 y.o. male presenting with near-syncope. The history is provided by the patient and a relative. No language interpreter was used.  Near Syncope This is a new problem. The current episode started 1 to 2 hours ago. The problem has not changed since onset.Pertinent negatives include no chest pain, no abdominal pain and no headaches. Associated symptoms comments: Generalized weakness and dizziness.. Nothing relieves the symptoms. He has tried nothing for the symptoms.   HPI Comments: AYDN FERRARA is a 80 y.o. male who presents to the Emergency Department complaining of dizziness and near syncope beginning approximately two hours ago when the patient awoke from a nap. Patient was non-ambulatory before arriving to the ED today. He reports associated generalized weakness. Patient reports that these symptoms are associated with standing up from a seated position. Patient reports generalized fatigue.  Patient has a history of lung cancer.  PCP is Dr. Wolfgang Phoenix.  Past Medical History  Diagnosis Date  . ASCVD (arteriosclerotic cardiovascular disease)      CABG in 04/1993; and negative stress nuclear study in 08/2001  . Hyperlipidemia   . Syncope   . Hypertension   . Tobacco abuse, in remission     40 pack years; discontinued in 1980  . Cerebrovascular disease     Right carotid bruit-40-69% left internal carotid artery stenosis in 4/06; followed [facility]  . Chronic kidney disease, stage II (mild)     Creatinine-1.6 in 09/2008  . Degenerative joint disease of knee, left   . Normocytic anemia   . Pulmonary disease   . Cancer     skin  . Lung cancer 2013  . Prediabetes   . CAD (coronary artery disease)   . COPD  (chronic obstructive pulmonary disease)   . Benign essential tremor   . Horseshoe kidney   . Renal insufficiency    Past Surgical History  Procedure Laterality Date  . Coronary artery bypass graft  1995  . Colonoscopy w/ polypectomy  1985  . Pilonidal cyst excision  1948  . Lesion excision    . Cardiac surgery     Family History  Problem Relation Age of Onset  . Cancer Brother   . Heart attack Brother    History  Substance Use Topics  . Smoking status: Former Smoker -- 1.00 packs/day for 40 years    Types: Cigarettes    Quit date: 08/14/1973  . Smokeless tobacco: Former Systems developer    Types: [place] date: 12/05/1980  . Alcohol Use: No    Review of Systems  Constitutional: Negative for appetite change and fatigue.  HENT: Negative for congestion, ear discharge and sinus pressure.   Eyes: Negative for discharge.  Respiratory: Negative for cough.   Cardiovascular: Positive for near-syncope. Negative for chest pain.  Gastrointestinal: Negative for abdominal pain and diarrhea.  Genitourinary: Negative for frequency and hematuria.  Musculoskeletal: Negative for back pain.  Skin: Negative for rash.  Neurological: Positive for dizziness and weakness. Negative for seizures and headaches.       Near syncope.  Psychiatric/Behavioral: Negative for hallucinations.      Allergies  Beta adrenergic blockers; Levaquin;  Penicillins; and Sulfonamide derivatives  Home Medications   Prior to Admission medications   Medication Sig Start Date End Date Taking? Authorizing Provider  aspirin (ASPIRIN CHILDRENS) 81 MG chewable tablet Chew 1 tablet (81 mg total) by mouth daily. 11/03/12 11/03/13 Yes Kathyrn Drown, MD  atorvastatin (LIPITOR) 40 MG tablet Take 1 tablet (40 mg total) by mouth daily. 08/22/13  Yes Kathyrn Drown, MD  budesonide-formoterol (SYMBICORT) 160-4.5 MCG/ACT inhaler Inhale 1 puff into the lungs 2 (two) times daily.   Yes Historical Provider, MD  diltiazem (CARDIZEM CD) 120  MG 24 hr capsule Take 120 mg by mouth daily.   Yes Historical Provider, MD  fluticasone (FLONASE) 50 MCG/ACT nasal spray Place 1 spray into the nose daily.    Yes Historical Provider, MD  metoprolol (LOPRESSOR) 50 MG tablet Take 1 tablet (50 mg total) by mouth daily. 08/22/13  Yes Kathyrn Drown, MD  Multiple Vitamin (MULTIVITAMIN WITH MINERALS) TABS Take 1 tablet by mouth daily.   Yes Historical Provider, MD  potassium chloride (K-DUR,KLOR-CON) 10 MEQ tablet Take 10 mEq by mouth 2 (two) times daily.   Yes Historical Provider, MD  terazosin (HYTRIN) 5 MG capsule Take 1 capsule (5 mg total) by mouth at bedtime. 08/22/13  Yes Kathyrn Drown, MD  tetrahydrozoline-zinc (VISINE-AC) 0.05-0.25 % ophthalmic solution Place 1 drop into both eyes daily.    Yes Historical Provider, MD  torsemide (DEMADEX) 20 MG tablet Take 20 mg by mouth 2 (two) times daily.   Yes Historical Provider, MD  albuterol (PROVENTIL HFA;VENTOLIN HFA) 108 (90 BASE) MCG/ACT inhaler Inhale 2 puffs into the lungs every 6 (six) hours as needed for shortness of breath. 07/21/13   Kathyrn Drown, MD  nitroGLYCERIN (NITROSTAT) 0.4 MG SL tablet Place 1 tablet (0.4 mg total) under the tongue every 5 (five) minutes as needed for chest pain. 09/19/13   Kathyrn Drown, MD   Triage Vitals: BP 184/55  Pulse 66  Temp(Src) 98 F (36.7 C) (Oral)  Resp 18  SpO2 98% Physical Exam  Constitutional: He is oriented to person, place, and time. He appears well-developed.  HENT:  Head: Normocephalic.  Eyes: Conjunctivae and EOM are normal. No scleral icterus.  Neck: Neck supple. No thyromegaly present.  Cardiovascular: Normal rate and regular rhythm.  Exam reveals no gallop and no friction rub.   No murmur heard. Pulmonary/Chest: No stridor. He has no wheezes. He has no rales. He exhibits no tenderness.  Abdominal: He exhibits no distension. There is no tenderness. There is no rebound.  Musculoskeletal: Normal range of motion. He exhibits no edema.   Lymphadenopathy:    He has no cervical adenopathy.  Neurological: He is alert and oriented to person, place, and time. He exhibits normal muscle tone. Coordination normal.  Intention tremor in all extremities. Generalized weakness.  Skin: No rash noted. No erythema.  Psychiatric: He has a normal mood and affect. His behavior is normal.    ED Course  Procedures (including critical care time) DIAGNOSTIC STUDIES: Oxygen Saturation is 98% on room air, normal by my interpretation.    COORDINATION OF CARE: 4:27 PM-Discussed treatment plan which includes head CT w/o contrast, orthostatic vitals, EKG, and labs with pt at bedside and pt agreed to plan.     Labs Review Labs Reviewed - No data to display  Imaging Review No results found.   EKG Interpretation   Date/Time:  Saturday October 21 2013 15:54:33 EDT Ventricular Rate:  68 PR Interval:  173  QRS Duration: 98 QT Interval:  430 QTC Calculation: 457 R Axis:   75 Text Interpretation:  Sinus rhythm Baseline wander in lead(s) V2 Confirmed  by Bisma Klett  MD, Marisol Giambra [phone]) on 10/21/2013 8:18:12 PM      MDM   Final diagnoses:  None    Dizziness resolved.  Nl studies.  Pt will go home and stay with his son and will follow up with his pcp next week   The chart was scribed for me under my direct supervision.  I personally performed the history, physical, and medical decision making and all procedures in the evaluation of this patient.Maudry Diego, MD 10/21/13 2019

## 2013-10-21 NOTE — ED Notes (Signed)
Pt comes from home via EMS after near syncopal episode. Pt states he became dizzy and pale and felt like he was going to pass out. Pt denies LOC, denies chest pain and SOB. Pt is A&Ox4.

## 2013-10-21 NOTE — Discharge Instructions (Signed)
Follow up with your md next week.  Return if  problems °

## 2013-10-21 NOTE — ED Notes (Signed)
MD at bedside. 

## 2013-10-23 ENCOUNTER — Encounter: Payer: Self-pay | Admitting: Family Medicine

## 2013-10-23 ENCOUNTER — Ambulatory Visit (INDEPENDENT_AMBULATORY_CARE_PROVIDER_SITE_OTHER): Payer: Medicare Other | Admitting: Family Medicine

## 2013-10-23 VITALS — BP 138/68 | Temp 98.1°F | Ht 67.0 in | Wt 164.0 lb

## 2013-10-23 DIAGNOSIS — J3800 Paralysis of vocal cords and larynx, unspecified: Secondary | ICD-10-CM

## 2013-10-23 DIAGNOSIS — R490 Dysphonia: Secondary | ICD-10-CM

## 2013-10-23 DIAGNOSIS — N289 Disorder of kidney and ureter, unspecified: Secondary | ICD-10-CM

## 2013-10-23 DIAGNOSIS — D649 Anemia, unspecified: Secondary | ICD-10-CM

## 2013-10-23 LAB — BASIC METABOLIC PANEL
BUN: 28 mg/dL — ABNORMAL HIGH (ref 6–23)
CHLORIDE: 106 meq/L (ref 96–112)
CO2: 28 meq/L (ref 19–32)
CREATININE: 2.12 mg/dL — AB (ref 0.50–1.35)
Calcium: 9.1 mg/dL (ref 8.4–10.5)
Glucose, Bld: 92 mg/dL (ref 70–99)
Potassium: 4.9 mEq/L (ref 3.5–5.3)
SODIUM: 142 meq/L (ref 135–145)

## 2013-10-23 LAB — HEMOGLOBIN AND HEMATOCRIT, BLOOD
HCT: 31.6 % — ABNORMAL LOW (ref 39.0–52.0)
Hemoglobin: 10.8 g/dL — ABNORMAL LOW (ref 13.0–17.0)

## 2013-10-23 LAB — FERRITIN: FERRITIN: 66 ng/mL (ref 22–322)

## 2013-10-23 NOTE — Patient Instructions (Signed)
Stop diltiazem  Do labs  Follow up in a few weeks for a recheck of orthostatic readings

## 2013-10-23 NOTE — Progress Notes (Signed)
   Subjective:    Patient ID: [name], male    DOB: Jun 12, 1921, 80 y.o.   MRN: [id number]  HPI Patient is here today for an ER follow up from 7/11.  On Saturday, he felt weak, dizzy, clammy, shallow breathing, and could not bear his own weight. Patient had been sitting outside for 30-40 minutes before this occurred His son had to call an ambulance b/c he was unable to walk pt to the car.  Pt went to the hospital and was released around 9pm. They ran a lot of tests on him, and they were told everything came back WNL and to f/u with their primary care doctor.  Pt states he feels better today. He did spit up a small amount of blood this morning, however. Felt like it was in the throat.  Pt and son would also like to f/u on the [place] doctor that he will be seeing in August to see if they can check out his left vocal cord.  History lung cancer.   Review of Systems See above denies fever chest pain unilateral numbness weakness    Objective:   Physical Exam  Has tremor, neck no masses throat normal lungs are clear hearts regular hoarseness noted extremities trace edema able to get on and off exam table orthostatics were completed patient does have orthostasis      Assessment & Plan:  Speak with [facility] doctor-we will connect with the specialist I believe this patient needs to be seen seen sooner than August probably needs a repeat on this scan possibly PET scan  Renal insuff-this fluctuates but we need to recheck this adequate hydration discuss  Anemia-we will look at ferritin to make sure there is no R. in deficiency with this  Orthostatic hypot--reduction of medication take away diltiazem followup blood pressure orthostatics in a few weeks  Minimal hemoptysis this is been looked into by a specialist. He will be going back there again  Patient canceling appointment with speech pathology at [facility]  I did speak with his specialists at [facility]. I let them know about his vocal cord  paralysis They stated they will be seeing him in August. This was relayed to the family by the nurse. They will probably repeat CT scan at that point.

## 2013-10-25 NOTE — Progress Notes (Signed)
Family notified. [name] spoke with Dr. Shawna Orleans at [facility].

## 2013-10-25 NOTE — Progress Notes (Signed)
Patient's family notified and verbalized understanding.

## 2013-10-27 ENCOUNTER — Ambulatory Visit (INDEPENDENT_AMBULATORY_CARE_PROVIDER_SITE_OTHER): Payer: Medicare Other | Admitting: *Deleted

## 2013-10-27 DIAGNOSIS — Z23 Encounter for immunization: Secondary | ICD-10-CM

## 2013-11-13 ENCOUNTER — Encounter: Payer: Self-pay | Admitting: Family Medicine

## 2013-11-13 ENCOUNTER — Ambulatory Visit (INDEPENDENT_AMBULATORY_CARE_PROVIDER_SITE_OTHER): Payer: Medicare Other | Admitting: Family Medicine

## 2013-11-13 VITALS — BP 120/70 | Ht 66.0 in | Wt 161.0 lb

## 2013-11-13 DIAGNOSIS — I10 Essential (primary) hypertension: Secondary | ICD-10-CM

## 2013-11-13 DIAGNOSIS — N183 Chronic kidney disease, stage 3 unspecified: Secondary | ICD-10-CM

## 2013-11-13 NOTE — Progress Notes (Signed)
   Subjective:    Patient ID: [name], male    DOB: 16-Sep-1921, 80 y.o.   MRN: [id number]  HPI Patient is here today for a 3 week follow up on his anemia. He will be going to [facility] to see his specialist.    He still has his hoarseness he understands to degree why this is happening he will see the specialist in a few weeks at [facility] He has less dizziness since a change in his medicines.   Review of Systems    denies chest tightness pressure pain denies rectal bleeding vomiting diarrhea fevers Objective:   Physical Exam Lungs are clear heart rate controlled extremities trace edema does have his tremor also has his hoarseness. Orthostatic blood pressures were taken no appreciable changes       Assessment & Plan:  #1 orthostatic blood pressure changes have stabilized HTN good control Tremor stable Seen specialist at [facility] in a few weeks for followup I gave him copies of his lab work I do not think he ought to have IV contrast

## 2013-11-21 DIAGNOSIS — J3801 Paralysis of vocal cords and larynx, unilateral: Secondary | ICD-10-CM | POA: Diagnosis not present

## 2013-11-21 DIAGNOSIS — R490 Dysphonia: Secondary | ICD-10-CM | POA: Diagnosis not present

## 2013-11-30 DIAGNOSIS — R918 Other nonspecific abnormal finding of lung field: Secondary | ICD-10-CM | POA: Diagnosis not present

## 2013-11-30 DIAGNOSIS — Z09 Encounter for follow-up examination after completed treatment for conditions other than malignant neoplasm: Secondary | ICD-10-CM | POA: Diagnosis not present

## 2013-11-30 DIAGNOSIS — Z85118 Personal history of other malignant neoplasm of bronchus and lung: Secondary | ICD-10-CM | POA: Diagnosis not present

## 2013-11-30 DIAGNOSIS — C3490 Malignant neoplasm of unspecified part of unspecified bronchus or lung: Secondary | ICD-10-CM | POA: Diagnosis not present

## 2013-12-05 DIAGNOSIS — Q828 Other specified congenital malformations of skin: Secondary | ICD-10-CM | POA: Diagnosis not present

## 2013-12-05 DIAGNOSIS — M79609 Pain in unspecified limb: Secondary | ICD-10-CM | POA: Diagnosis not present

## 2013-12-05 DIAGNOSIS — B351 Tinea unguium: Secondary | ICD-10-CM | POA: Diagnosis not present

## 2013-12-22 ENCOUNTER — Telehealth: Payer: Self-pay | Admitting: Family Medicine

## 2013-12-22 ENCOUNTER — Other Ambulatory Visit: Payer: Self-pay | Admitting: Family Medicine

## 2013-12-22 ENCOUNTER — Other Ambulatory Visit: Payer: Self-pay | Admitting: *Deleted

## 2013-12-22 MED ORDER — BUDESONIDE-FORMOTEROL FUMARATE 160-4.5 MCG/ACT IN AERO: 1.0000 | INHALATION_SPRAY | Freq: Two times a day (BID) | RESPIRATORY_TRACT | Status: DC

## 2013-12-22 MED ORDER — ALBUTEROL SULFATE HFA 108 (90 BASE) MCG/ACT IN AERS: 2.0000 | INHALATION_SPRAY | Freq: Four times a day (QID) | RESPIRATORY_TRACT | Status: DC | PRN

## 2013-12-22 NOTE — Telephone Encounter (Signed)
meds sent to pharm. Pt notified.  

## 2013-12-22 NOTE — Telephone Encounter (Signed)
Patient needs 3 month supply of ventolin and symbicort.  [facility] [place]

## 2014-01-01 ENCOUNTER — Other Ambulatory Visit: Payer: Self-pay | Admitting: *Deleted

## 2014-01-01 ENCOUNTER — Other Ambulatory Visit: Payer: Self-pay | Admitting: Family Medicine

## 2014-01-01 ENCOUNTER — Ambulatory Visit (INDEPENDENT_AMBULATORY_CARE_PROVIDER_SITE_OTHER): Payer: Medicare Other | Admitting: Family Medicine

## 2014-01-01 ENCOUNTER — Ambulatory Visit (HOSPITAL_COMMUNITY)
Admission: RE | Admit: 2014-01-01 | Discharge: 2014-01-01 | Disposition: A | Discharge status: Home / Self Care | Payer: Medicare Other | Source: Ambulatory Visit | Attending: Family Medicine | Admitting: Family Medicine

## 2014-01-01 DIAGNOSIS — M25552 Pain in left hip: Secondary | ICD-10-CM

## 2014-01-01 DIAGNOSIS — S7000XA Contusion of unspecified hip, initial encounter: Secondary | ICD-10-CM | POA: Diagnosis not present

## 2014-01-01 DIAGNOSIS — M898X5 Other specified disorders of bone, thigh: Secondary | ICD-10-CM

## 2014-01-01 DIAGNOSIS — W19XXXA Unspecified fall, initial encounter: Secondary | ICD-10-CM | POA: Diagnosis not present

## 2014-01-01 DIAGNOSIS — D235 Other benign neoplasm of skin of trunk: Secondary | ICD-10-CM | POA: Diagnosis not present

## 2014-01-01 DIAGNOSIS — L570 Actinic keratosis: Secondary | ICD-10-CM | POA: Diagnosis not present

## 2014-01-01 DIAGNOSIS — S79929A Unspecified injury of unspecified thigh, initial encounter: Secondary | ICD-10-CM | POA: Diagnosis not present

## 2014-01-01 DIAGNOSIS — M25559 Pain in unspecified hip: Secondary | ICD-10-CM | POA: Diagnosis not present

## 2014-01-01 DIAGNOSIS — S79919A Unspecified injury of unspecified hip, initial encounter: Secondary | ICD-10-CM | POA: Diagnosis not present

## 2014-01-01 DIAGNOSIS — Z85828 Personal history of other malignant neoplasm of skin: Secondary | ICD-10-CM | POA: Diagnosis not present

## 2014-01-01 DIAGNOSIS — S7002XA Contusion of left hip, initial encounter: Secondary | ICD-10-CM

## 2014-01-01 NOTE — Progress Notes (Signed)
   Subjective:    Patient ID: [name], male    DOB: 1921/07/03, 80 y.o.   MRN: [id number]  HPIWalked in office today complaining of left leg and left hip pain from fall yesterday.   Denies any previous trouble with this.  Review of Systems Complains of mild pain pain with walking as well    Objective:   Physical Exam  Significant contusions of the left hip no obvious fracture tenderness pain discomfort in the hip and thigh no sign of DVT      Assessment & Plan:  Leg pain contusions x-rays were warranted await the results hopefully will be negative

## 2014-01-02 ENCOUNTER — Ambulatory Visit: Payer: Medicare Other | Admitting: Family Medicine

## 2014-01-10 ENCOUNTER — Other Ambulatory Visit: Payer: Self-pay | Admitting: Family Medicine

## 2014-01-10 ENCOUNTER — Ambulatory Visit (INDEPENDENT_AMBULATORY_CARE_PROVIDER_SITE_OTHER): Payer: Medicare Other | Admitting: *Deleted

## 2014-01-10 DIAGNOSIS — Z23 Encounter for immunization: Secondary | ICD-10-CM | POA: Diagnosis not present

## 2014-01-23 ENCOUNTER — Encounter: Payer: Self-pay | Admitting: Family Medicine

## 2014-01-23 ENCOUNTER — Ambulatory Visit (INDEPENDENT_AMBULATORY_CARE_PROVIDER_SITE_OTHER): Payer: Medicare Other | Admitting: Family Medicine

## 2014-01-23 VITALS — BP 112/60 | Ht 66.0 in | Wt 162.4 lb

## 2014-01-23 DIAGNOSIS — I10 Essential (primary) hypertension: Secondary | ICD-10-CM | POA: Diagnosis not present

## 2014-01-23 DIAGNOSIS — J438 Other emphysema: Secondary | ICD-10-CM | POA: Diagnosis not present

## 2014-01-23 MED ORDER — ATORVASTATIN CALCIUM 40 MG PO TABS: 40.0000 mg | ORAL_TABLET | Freq: Every day | ORAL | Status: DC

## 2014-01-23 MED ORDER — ALBUTEROL SULFATE HFA 108 (90 BASE) MCG/ACT IN AERS: 2.0000 | INHALATION_SPRAY | Freq: Four times a day (QID) | RESPIRATORY_TRACT | Status: DC | PRN

## 2014-01-23 NOTE — Patient Instructions (Signed)
Office visit in Jan 20166  Bring all meds to visit

## 2014-01-23 NOTE — Progress Notes (Signed)
   Subjective:    Patient ID: [name], male    DOB: [birth year], 80 y.o.   MRN: [id number]  Hypertension This is a chronic problem. The current episode started more than 1 year ago. The problem has been gradually improving since onset. The problem is controlled. Pertinent negatives include no chest pain. There are no associated agents to hypertension. There are no known risk factors for coronary artery disease. Treatments tried: metoprolol. The current treatment provides significant improvement. There are no compliance problems.    Patient states he has no concerns at this time.  COPD stable  Review of Systems  Constitutional: Negative for activity change, appetite change and fatigue.  HENT: Negative for congestion.   Respiratory: Negative for cough.   Cardiovascular: Negative for chest pain.  Gastrointestinal: Negative for abdominal pain.  Endocrine: Negative for polydipsia and polyphagia.  Neurological: Negative for weakness.  Psychiatric/Behavioral: Negative for confusion.       Objective:   Physical Exam  Vitals reviewed. Constitutional: He appears well-nourished. No distress.  Cardiovascular: Normal rate and normal heart sounds.   No murmur heard. Pulmonary/Chest: Effort normal and breath sounds normal. No respiratory distress.  Musculoskeletal: He exhibits no edema.  Lymphadenopathy:    He has no cervical adenopathy.  Neurological: He is alert.  Psychiatric: His behavior is normal.          Assessment & Plan:  The pressure good not orthostatic continue medications as is  When patient follows up in January will need comprehensive lab work

## 2014-01-30 DIAGNOSIS — M79672 Pain in left foot: Secondary | ICD-10-CM | POA: Diagnosis not present

## 2014-01-30 DIAGNOSIS — Q828 Other specified congenital malformations of skin: Secondary | ICD-10-CM | POA: Diagnosis not present

## 2014-02-10 ENCOUNTER — Other Ambulatory Visit: Payer: Self-pay | Admitting: Family Medicine

## 2014-02-13 ENCOUNTER — Telehealth: Payer: Self-pay | Admitting: Family Medicine

## 2014-02-13 DIAGNOSIS — X32XXXD Exposure to sunlight, subsequent encounter: Secondary | ICD-10-CM | POA: Diagnosis not present

## 2014-02-13 DIAGNOSIS — L820 Inflamed seborrheic keratosis: Secondary | ICD-10-CM | POA: Diagnosis not present

## 2014-02-13 DIAGNOSIS — L570 Actinic keratosis: Secondary | ICD-10-CM | POA: Diagnosis not present

## 2014-02-13 DIAGNOSIS — Z08 Encounter for follow-up examination after completed treatment for malignant neoplasm: Secondary | ICD-10-CM | POA: Diagnosis not present

## 2014-02-13 DIAGNOSIS — Z85828 Personal history of other malignant neoplasm of skin: Secondary | ICD-10-CM | POA: Diagnosis not present

## 2014-02-13 NOTE — Telephone Encounter (Signed)
Notified Ronalee Belts that refill request came from pharmacy. Ronalee Belts verbalized understanding.

## 2014-02-13 NOTE — Telephone Encounter (Signed)
torsemide (DEMADEX) 20 MG table  [facility] but he did not request it He has about 40 pills left at home still from his last refill. Wants to know if there  Was a change in the an did Dr Nicki Reaper order a new script?   Please advise pt's son

## 2014-02-19 ENCOUNTER — Ambulatory Visit (INDEPENDENT_AMBULATORY_CARE_PROVIDER_SITE_OTHER): Payer: Medicare Other | Admitting: Family Medicine

## 2014-02-19 ENCOUNTER — Other Ambulatory Visit: Payer: Self-pay | Admitting: *Deleted

## 2014-02-19 ENCOUNTER — Ambulatory Visit (HOSPITAL_COMMUNITY)
Admission: RE | Admit: 2014-02-19 | Discharge: 2014-02-19 | Disposition: A | Discharge status: Home / Self Care | Payer: Medicare Other | Source: Ambulatory Visit | Attending: Family Medicine | Admitting: Family Medicine

## 2014-02-19 ENCOUNTER — Encounter: Payer: Self-pay | Admitting: Family Medicine

## 2014-02-19 VITALS — BP 122/62 | Temp 98.9°F | Ht 66.0 in | Wt 162.5 lb

## 2014-02-19 DIAGNOSIS — R0602 Shortness of breath: Secondary | ICD-10-CM | POA: Insufficient documentation

## 2014-02-19 DIAGNOSIS — J440 Chronic obstructive pulmonary disease with acute lower respiratory infection: Secondary | ICD-10-CM | POA: Insufficient documentation

## 2014-02-19 DIAGNOSIS — Z85118 Personal history of other malignant neoplasm of bronchus and lung: Secondary | ICD-10-CM | POA: Insufficient documentation

## 2014-02-19 DIAGNOSIS — R05 Cough: Secondary | ICD-10-CM | POA: Diagnosis present

## 2014-02-19 DIAGNOSIS — J449 Chronic obstructive pulmonary disease, unspecified: Secondary | ICD-10-CM | POA: Diagnosis not present

## 2014-02-19 MED ORDER — CEFPROZIL 500 MG PO TABS: 500.0000 mg | ORAL_TABLET | Freq: Two times a day (BID) | ORAL | Status: DC

## 2014-02-19 MED ORDER — PREDNISONE 20 MG PO TABS: ORAL_TABLET | ORAL | Status: DC

## 2014-02-19 MED ORDER — CEFTRIAXONE SODIUM 1 G IJ SOLR
1.0000 g | Freq: Once | INTRAMUSCULAR | Status: AC
  Administered 2014-02-19: 1 g via INTRAMUSCULAR

## 2014-02-19 NOTE — Progress Notes (Signed)
   Subjective:    Patient ID: [name], male    DOB: [birth year], 80 y.o.   MRN: [id number]  Sinusitis This is a new problem. The current episode started in the past 7 days. The problem is unchanged. There has been no fever. The pain is mild. Associated symptoms include congestion, coughing and ear pain. Past treatments include nothing. The treatment provided no relief.   Patient states that he has no other concerns at this time.  PMH COPD/lung cancer/viral syndrome/sinus infections  Review of Systems  HENT: Positive for congestion and ear pain.   Respiratory: Positive for cough.    No vomiting or diarrhea no high fevers    Objective:   Physical Exam  Constitutional: He appears well-developed.  HENT:  Head: Normocephalic.  Mouth/Throat: Oropharynx is clear and moist. No oropharyngeal exudate.  Neck: Normal range of motion.  Cardiovascular: Normal rate, regular rhythm and normal heart sounds.   No murmur heard. Pulmonary/Chest: Effort normal and breath sounds normal. He has no wheezes.  Lymphadenopathy:    He has no cervical adenopathy.  Neurological: He exhibits normal muscle tone.  Skin: Skin is warm and dry.  Nursing note and vitals reviewed.         Assessment & Plan:  Viral syndrome Acute bronchitis with Rales in the right base History lung cancer Chest x-ray ordered Rocephin 1 g Cefzil 500 mg twice a day if progressive symptoms are worse follow-up immediately. Steroids not indicated currently

## 2014-02-19 NOTE — Progress Notes (Signed)
Patient's son notified and verbalized understanding of the test results. No further questions.

## 2014-03-20 DIAGNOSIS — M79672 Pain in left foot: Secondary | ICD-10-CM | POA: Diagnosis not present

## 2014-03-20 DIAGNOSIS — Q828 Other specified congenital malformations of skin: Secondary | ICD-10-CM | POA: Diagnosis not present

## 2014-04-10 DIAGNOSIS — M79672 Pain in left foot: Secondary | ICD-10-CM | POA: Diagnosis not present

## 2014-04-10 DIAGNOSIS — Q828 Other specified congenital malformations of skin: Secondary | ICD-10-CM | POA: Diagnosis not present

## 2014-04-11 ENCOUNTER — Other Ambulatory Visit: Payer: Self-pay | Admitting: Family Medicine

## 2014-04-11 ENCOUNTER — Telehealth: Payer: Self-pay | Admitting: Family Medicine

## 2014-04-11 NOTE — Telephone Encounter (Signed)
Patient called this morning needing a prescription for torsemide 20 mg called into [facility] [place].

## 2014-04-20 ENCOUNTER — Ambulatory Visit (INDEPENDENT_AMBULATORY_CARE_PROVIDER_SITE_OTHER): Payer: Medicare Other | Admitting: Family Medicine

## 2014-04-20 ENCOUNTER — Encounter: Payer: Self-pay | Admitting: Family Medicine

## 2014-04-20 VITALS — BP 120/68 | Ht 66.0 in | Wt 161.2 lb

## 2014-04-20 DIAGNOSIS — W19XXXA Unspecified fall, initial encounter: Secondary | ICD-10-CM

## 2014-04-20 DIAGNOSIS — R7309 Other abnormal glucose: Secondary | ICD-10-CM

## 2014-04-20 DIAGNOSIS — R197 Diarrhea, unspecified: Secondary | ICD-10-CM

## 2014-04-20 DIAGNOSIS — R7303 Prediabetes: Secondary | ICD-10-CM

## 2014-04-20 DIAGNOSIS — R109 Unspecified abdominal pain: Secondary | ICD-10-CM

## 2014-04-20 DIAGNOSIS — R634 Abnormal weight loss: Secondary | ICD-10-CM | POA: Diagnosis not present

## 2014-04-20 MED ORDER — METRONIDAZOLE 500 MG PO TABS: 500.0000 mg | ORAL_TABLET | Freq: Three times a day (TID) | ORAL | Status: DC

## 2014-04-20 NOTE — Progress Notes (Signed)
   Subjective:    Patient ID: [name], male    DOB: September 21, 1921, 81 y.o.   MRN: [id number]  Abdominal Pain This is a new problem. The current episode started 1 to 4 weeks ago. The onset quality is gradual. The problem occurs intermittently. The problem has been unchanged. The pain is located in the generalized abdominal region. The pain is mild. The quality of the pain is cramping. The abdominal pain does not radiate. Associated symptoms include diarrhea. Pertinent negatives include no constipation, nausea or vomiting. Nothing aggravates the pain. The pain is relieved by nothing. He has tried nothing for the symptoms. The treatment provided no relief.   Patient states that he fell on Tuesday. Bruising noted on his arms. No other injuries noted.    Review of Systems  Respiratory: Positive for shortness of breath (COPD).   Cardiovascular: Negative for chest pain.  Gastrointestinal: Positive for abdominal pain and diarrhea. Negative for nausea, vomiting, constipation, blood in stool, abdominal distention and anal bleeding.  cramping No fever No triggers  does not wake him up at night he denies any blood in the bowel movements. He states his energy level is somewhat lower. He also states that he does not want to eat as much he has lost some weight from 6 months ago but only a little weight over the past couple months he has taken 2 falls in the past  30 days    Objective:   Physical Exam  lungs are clear cough noted tremor noted abdomen soft no guarding or rebound extremities no edema   she was observed getting off the exam table walking down the hallway turnaround walk back no apparent weakness or ataxia noted     Assessment & Plan:   intermittent abdominal pain possibly related to underlying illness possibly C. Difficile. Will treat with metronidazole. Await the results of his tests.   elevated fall risk-patient is due as much walking as he can keep his muscles strong use a cane when  necessary.   His weight will be monitored closely he was encouraged to eat healthy and regular. Follow-up in a couple months

## 2014-04-23 DIAGNOSIS — R197 Diarrhea, unspecified: Secondary | ICD-10-CM | POA: Diagnosis not present

## 2014-04-23 DIAGNOSIS — R109 Unspecified abdominal pain: Secondary | ICD-10-CM | POA: Diagnosis not present

## 2014-04-23 DIAGNOSIS — R7309 Other abnormal glucose: Secondary | ICD-10-CM | POA: Diagnosis not present

## 2014-04-24 LAB — CBC WITH DIFFERENTIAL/PLATELET
BASOS ABS: 0 10*3/uL (ref 0.0–0.1)
Basophils Relative: 0 % (ref 0–1)
EOS PCT: 3 % (ref 0–5)
Eosinophils Absolute: 0.2 10*3/uL (ref 0.0–0.7)
HEMATOCRIT: 32.3 % — AB (ref 39.0–52.0)
Hemoglobin: 10.6 g/dL — ABNORMAL LOW (ref 13.0–17.0)
LYMPHS ABS: 2.4 10*3/uL (ref 0.7–4.0)
Lymphocytes Relative: 34 % (ref 12–46)
MCH: 30.9 pg (ref 26.0–34.0)
MCHC: 32.8 g/dL (ref 30.0–36.0)
MCV: 94.2 fL (ref 78.0–100.0)
MONO ABS: 0.9 10*3/uL (ref 0.1–1.0)
MONOS PCT: 13 % — AB (ref 3–12)
MPV: 9.9 fL (ref 8.6–12.4)
NEUTROS ABS: 3.5 10*3/uL (ref 1.7–7.7)
Neutrophils Relative %: 50 % (ref 43–77)
PLATELETS: 226 10*3/uL (ref 150–400)
RBC: 3.43 MIL/uL — ABNORMAL LOW (ref 4.22–5.81)
RDW: 14.5 % (ref 11.5–15.5)
WBC: 7 10*3/uL (ref 4.0–10.5)

## 2014-04-24 LAB — HEPATIC FUNCTION PANEL
ALT: 12 U/L (ref 0–53)
AST: 20 U/L (ref 0–37)
Albumin: 3.5 g/dL (ref 3.5–5.2)
Alkaline Phosphatase: 92 U/L (ref 39–117)
BILIRUBIN INDIRECT: 0.4 mg/dL (ref 0.2–1.2)
Bilirubin, Direct: 0.2 mg/dL (ref 0.0–0.3)
TOTAL PROTEIN: 6.2 g/dL (ref 6.0–8.3)
Total Bilirubin: 0.6 mg/dL (ref 0.2–1.2)

## 2014-04-24 LAB — BASIC METABOLIC PANEL
BUN: 36 mg/dL — AB (ref 6–23)
CHLORIDE: 103 meq/L (ref 96–112)
CO2: 29 mEq/L (ref 19–32)
CREATININE: 2.07 mg/dL — AB (ref 0.50–1.35)
Calcium: 9.1 mg/dL (ref 8.4–10.5)
Glucose, Bld: 97 mg/dL (ref 70–99)
Potassium: 4.6 mEq/L (ref 3.5–5.3)
Sodium: 139 mEq/L (ref 135–145)

## 2014-04-24 LAB — HEMOGLOBIN A1C
Hgb A1c MFr Bld: 5.8 % — ABNORMAL HIGH (ref <5.7)
Mean Plasma Glucose: 120 mg/dL — ABNORMAL HIGH (ref <117)

## 2014-04-24 LAB — FECAL LACTOFERRIN, QUANT: LACTOFERRIN: POSITIVE

## 2014-04-24 LAB — LIPASE: Lipase: 26 U/L (ref 0–75)

## 2014-04-24 LAB — CLOSTRIDIUM DIFFICILE BY PCR: Toxigenic C. Difficile by PCR: NOT DETECTED

## 2014-04-25 ENCOUNTER — Encounter: Payer: Self-pay | Admitting: Family Medicine

## 2014-04-25 ENCOUNTER — Ambulatory Visit (INDEPENDENT_AMBULATORY_CARE_PROVIDER_SITE_OTHER): Payer: Medicare Other | Admitting: Family Medicine

## 2014-04-25 VITALS — BP 118/72 | Temp 98.3°F | Ht 66.0 in | Wt 160.0 lb

## 2014-04-25 DIAGNOSIS — R1310 Dysphagia, unspecified: Secondary | ICD-10-CM

## 2014-04-25 DIAGNOSIS — R1030 Lower abdominal pain, unspecified: Secondary | ICD-10-CM | POA: Diagnosis not present

## 2014-04-25 DIAGNOSIS — R197 Diarrhea, unspecified: Secondary | ICD-10-CM | POA: Diagnosis not present

## 2014-04-25 NOTE — Progress Notes (Signed)
   Subjective:    Patient ID: [name], male    DOB: [birth year], 81 y.o.   MRN: [id number]  HPI Patient is still having diarrhea. He denies bloody stools denies sweats chills nausea or vomiting. Having frequent loose stools 2-3 per day has not improved any with the metronidazole  He was seen here on 01/08 for same s/s  Having some left sided groin pain. That started today. States the soreness in his left lower groin no fevers no bloody stools.  Patient also relates when he eats certain foods it seems to get hung up her stuck on him. Has not happen today but has happen intermittently  PMH cancer as well as COPD    Review of Systems  Constitutional: Negative for fever and fatigue.  HENT: Negative for congestion.   Respiratory: Positive for cough.   Cardiovascular: Negative for chest pain.  Gastrointestinal: Positive for abdominal pain and diarrhea. Negative for nausea and vomiting.       Objective:   Physical Exam  Constitutional: He appears well-nourished. No distress.  Cardiovascular: Normal rate, regular rhythm and normal heart sounds.   No murmur heard. Pulmonary/Chest: Effort normal and breath sounds normal. No respiratory distress.  Abdominal: There is tenderness. There is no rebound and no guarding.  Musculoskeletal: He exhibits no edema.  Lymphadenopathy:    He has no cervical adenopathy.  Neurological: He is alert.  Psychiatric: His behavior is normal.  Vitals reviewed.         Assessment & Plan:  Probable diarrhea versus colitis. Add Cipro. Continue metronidazole.  Dysphagia possible stricture will need EGD although not emergent    I did discuss his case with the gastroenterologist they will be calling him to set up testing including endoscopy as well as possible sigmoidoscopy

## 2014-04-26 ENCOUNTER — Other Ambulatory Visit: Payer: Self-pay | Admitting: Family Medicine

## 2014-04-26 ENCOUNTER — Ambulatory Visit: Payer: Medicare Other | Admitting: Family Medicine

## 2014-04-26 ENCOUNTER — Telehealth: Payer: Self-pay | Admitting: Family Medicine

## 2014-04-26 LAB — STOOL CULTURE

## 2014-04-26 MED ORDER — CIPROFLOXACIN HCL 250 MG PO TABS: 250.0000 mg | ORAL_TABLET | Freq: Two times a day (BID) | ORAL | Status: AC

## 2014-04-26 MED ORDER — DICYCLOMINE HCL 10 MG PO CAPS: 10.0000 mg | ORAL_CAPSULE | Freq: Three times a day (TID) | ORAL | Status: DC | PRN

## 2014-04-26 NOTE — Telephone Encounter (Signed)
The medication was sent in. I placed a phone call to speak with Dr.Rehman yesterday at 445. I am still not heard anything back. Please call his office if necessary please page him. It is okay to page him to my cell number [phone]

## 2014-04-26 NOTE — Telephone Encounter (Signed)
Discussed with patient. Patient verbalized understanding. 

## 2014-04-26 NOTE — Telephone Encounter (Signed)
error 

## 2014-04-26 NOTE — Telephone Encounter (Signed)
Pt is needing the cipro and [facility]yl [facility] that [facility]  Prescribed yesterday.

## 2014-04-26 NOTE — Telephone Encounter (Signed)
Please let the patient know that I did speak with Dr. Laural Golden, let the patient know that his office will be calling him to have him be seen next week and then after they see him at the office they will probably set him up for this scope test. Let him know that the GI specialist agreed with the medications that we started. Let us know if any problems.

## 2014-05-01 DIAGNOSIS — M79672 Pain in left foot: Secondary | ICD-10-CM | POA: Diagnosis not present

## 2014-05-01 DIAGNOSIS — L851 Acquired keratosis [keratoderma] palmaris et plantaris: Secondary | ICD-10-CM | POA: Diagnosis not present

## 2014-05-01 DIAGNOSIS — Q828 Other specified congenital malformations of skin: Secondary | ICD-10-CM | POA: Diagnosis not present

## 2014-05-08 ENCOUNTER — Ambulatory Visit (INDEPENDENT_AMBULATORY_CARE_PROVIDER_SITE_OTHER): Payer: Medicare Other | Admitting: Internal Medicine

## 2014-05-08 ENCOUNTER — Encounter (INDEPENDENT_AMBULATORY_CARE_PROVIDER_SITE_OTHER): Payer: Self-pay | Admitting: Internal Medicine

## 2014-05-08 VITALS — BP 120/68 | HR 74 | Temp 97.6°F | Resp 18 | Ht 66.0 in | Wt 160.3 lb

## 2014-05-08 DIAGNOSIS — R197 Diarrhea, unspecified: Secondary | ICD-10-CM | POA: Diagnosis not present

## 2014-05-08 DIAGNOSIS — R1310 Dysphagia, unspecified: Secondary | ICD-10-CM | POA: Diagnosis not present

## 2014-05-08 DIAGNOSIS — Z86010 Personal history of colonic polyps: Secondary | ICD-10-CM

## 2014-05-08 NOTE — Patient Instructions (Signed)
Can take Imodium OTC 2 mg once or twice daily as needed for diarrhea. Call if diarrhea or swallowing difficulty recurs.

## 2014-05-08 NOTE — Progress Notes (Signed)
Presenting complaint;  Evaluation for diarrhea and dysphagia.  History of present illness;  Patient is 81 year old Caucasian male who is referred through courtesy Dr. Roxy Manns for GI evaluation. Patient presented to him about two weeks ago with three-week history of nonbloody diarrhea and he was also complaining of dysphagia. Patient had stool studies. Fecal lactoferrin and was positive but cultures and C. difficile by PCR was negative. Patient was empirically treated with metronidazole and also given prescription for dicyclomine. He has finished metronidazole. He has few dicyclomine doses left. He is having normal stools. He denies abdominal pain melena or rectal bleeding. He states following difficulty has resolved completely. He denies frequent heartburn. His appetite is good and he has not lost any weight. There is no history of peptic ulcer disease. There is also no history of antibiotic use prior to onset of these symptoms. Patient states he is very active. He is still driving.  Current Medications: Outpatient Encounter Prescriptions as of 05/08/2014  Medication Sig  . albuterol (PROVENTIL HFA;VENTOLIN HFA) 108 (90 BASE) MCG/ACT inhaler Inhale 2 puffs into the lungs every 6 (six) hours as needed for shortness of breath.  Marland Kitchen atorvastatin (LIPITOR) 40 MG tablet Take 1 tablet (40 mg total) by mouth daily.  . budesonide-formoterol (SYMBICORT) 160-4.5 MCG/ACT inhaler Inhale 1 puff into the lungs 2 (two) times daily.  Marland Kitchen dicyclomine (BENTYL) 10 MG capsule Take 1 capsule (10 mg total) by mouth 3 (three) times daily as needed for spasms.  . fluticasone (FLONASE) 50 MCG/ACT nasal spray Place 1 spray into the nose daily.   Marland Kitchen KLOR-CON M10 10 MEQ tablet TAKE 1 TABLET TWICE A DAY  . metoprolol (LOPRESSOR) 50 MG tablet Take 1 tablet (50 mg total) by mouth daily.  . metroNIDAZOLE (FLAGYL) 500 MG tablet   . Multiple Vitamin (MULTIVITAMIN WITH MINERALS) TABS Take 1 tablet by mouth daily.  .  nitroGLYCERIN (NITROSTAT) 0.4 MG SL tablet Place 1 tablet (0.4 mg total) under the tongue every 5 (five) minutes as needed for chest pain.  . potassium chloride (K-DUR,KLOR-CON) 10 MEQ tablet Take 10 mEq by mouth 2 (two) times daily.  . SYMBICORT 160-4.5 MCG/ACT inhaler INHALE 1 PUFF TWICE A DAY  . terazosin (HYTRIN) 5 MG capsule Take 1 capsule (5 mg total) by mouth at bedtime.  Marland Kitchen tetrahydrozoline-zinc (VISINE-AC) 0.05-0.25 % ophthalmic solution Place 1 drop into both eyes daily.   Marland Kitchen torsemide (DEMADEX) 20 MG tablet TAKE 1 TABLET IN THE MORNING AND TAKE 1 TABLET AT NOON (Patient taking differently: TAKE 1 TABLET IN THE MORNING)   Past Medical History  Diagnosis Date  . ASCVD (arteriosclerotic cardiovascular disease)      CABG in 04/1993; and negative stress nuclear study in 08/2001  . Hyperlipidemia   . Syncope   . Hypertension   . Tobacco abuse, in remission     40 pack years; discontinued in 1980  . Cerebrovascular disease     Right carotid bruit-40-69% left internal carotid artery stenosis in 4/06; followed [facility]  . Chronic kidney disease, stage II (mild)     Creatinine-1.6 in 09/2008  . Degenerative joint disease of knee, left   . Normocytic anemia   . Pulmonary disease   . Cancer     skin  . Lung cancer 2013  . Prediabetes   . CAD (coronary artery disease)   . COPD (chronic obstructive pulmonary disease)   . Benign essential tremor   . Horseshoe kidney   . Renal insufficiency    Past Surgical  History  Procedure Laterality Date  . Coronary artery bypass graft  1995  . Colonoscopy w/ polypectomy  1985  . Pilonidal cyst excision  1948  .  bilateral cataract extraction     .      Allergies; Allergies  Allergen Reactions  . Beta Adrenergic Blockers Hives and Swelling  . Levaquin [Levofloxacin In D5w] Nausea And Vomiting  . Penicillins Hives and Swelling  . Sulfonamide Derivatives Hives and Swelling    Family history;  Father had chronic kidney disease and died at  13. Mother had colon carcinoma in her 41s and died at 70. Two brothers were diagnosed with colon carcinoma. One was 79 years old and the other one was 26.  Social history;  He is married and his wife is in good health. HE has 2 sons and a daughter. Son age 66 was recently diagnosed with thyroid cancer and received treatment at [facility]. Other 2 children are in good health. He is retired Clinical biochemist. He smoked a pack a day for 30 years but quit in 1975. He has not had any alcohol in 40 years.    Objective: Blood pressure 120/68, pulse 74, temperature 97.6 F (36.4 C), temperature source Oral, resp. rate 18, height 5\' 6"  (1.676 m), weight 160 lb 4.8 oz (72.712 kg). Patient is alert and in no acute distress. Conjunctiva is pink. Sclera is nonicteric Oropharyngeal mucosa is normal. No neck masses or thyromegaly noted. Cardiac exam with regular rhythm normal S1 and S2. No murmur or gallop noted. Lungs are clear to auscultation. Abdomen is symmetrical. Bowel sounds are normal. On palpation is soft and nontender without organomegaly or masses. No LE edema or clubbing noted.  Labs/studies Results: Lab studies from 04/23/2014. Fecal lactoferrin positive. Stool culture negative  Stool C. difficile by PCR negative WBC 7.0, H&H 10.6 and 32.3 and platelet count 226K EO: 3%.  Assessment:  #1. Recent onset of diarrhea with  negative stool cultures and C. difficile but positive lactoferrin. He was empirically treated with metronidazole and diarrhea has resolved. Therefore no further workup indicated unless diarrhea relapses. #2. Recent history of dysphagia while he was sick with diarrhea. This symptom has also completely resolved. He has no difficulty swallowing liquids or solids whatsoever #3. History of colonic adenomas and strong family history of colon carcinoma. Last colonoscopy was in November 2011. #4. Anemia of chronic disease. H&H is stable.   Recommendations;  Patient advised to  discontinue dicyclomine(he has few doses left). He can use Imodium OTC 2 mg by mouth twice a day when necessary. Patient will call if diarrhea or dysphagia recurs. Office visit in November 2016 discuss whether or not he should have colonoscopy.

## 2014-05-10 ENCOUNTER — Encounter: Payer: Self-pay | Admitting: Family Medicine

## 2014-05-10 ENCOUNTER — Ambulatory Visit (INDEPENDENT_AMBULATORY_CARE_PROVIDER_SITE_OTHER): Payer: Medicare Other | Admitting: Family Medicine

## 2014-05-10 VITALS — BP 122/64 | Ht 67.0 in | Wt 162.0 lb

## 2014-05-10 DIAGNOSIS — I10 Essential (primary) hypertension: Secondary | ICD-10-CM | POA: Diagnosis not present

## 2014-05-10 DIAGNOSIS — R197 Diarrhea, unspecified: Secondary | ICD-10-CM | POA: Diagnosis not present

## 2014-05-10 DIAGNOSIS — R1030 Lower abdominal pain, unspecified: Secondary | ICD-10-CM

## 2014-05-10 NOTE — Progress Notes (Signed)
   Subjective:    Patient ID: [name], male    DOB: 1921-10-27, 81 y.o.   MRN: [id number]  HPIFollow up on abd pain. Saw Dr. Laural Golden on tues. Pt states he is feeling much better.  Pt states no other concerns today.  Patient states he still much better than what he was. States breathing is doing good he is careful getting out with cold weather PMH benign has history of COPD has history of resting tremors hypertension.  Review of Systems Denies vomiting diarrhea fever chills sweats.    Objective:   Physical Exam   Abdomen soft no guarding or rebound extremities some edema edema at the ankles but this is normal for the patient lungs are clear heart regular skin warm dry Resting tremor in the arms noted    Assessment & Plan:  Abdominal discomfort with diarrhea has resolved. No need to take further antibiotics reassurance given  Baseline health issues blood pressure resting tremor COPD stable

## 2014-05-15 ENCOUNTER — Ambulatory Visit (INDEPENDENT_AMBULATORY_CARE_PROVIDER_SITE_OTHER): Payer: Medicare Other | Admitting: Family Medicine

## 2014-05-15 ENCOUNTER — Encounter: Payer: Self-pay | Admitting: Family Medicine

## 2014-05-15 VITALS — BP 122/64 | Temp 98.1°F | Ht 67.0 in | Wt 157.0 lb

## 2014-05-15 DIAGNOSIS — R1030 Lower abdominal pain, unspecified: Secondary | ICD-10-CM

## 2014-05-15 DIAGNOSIS — R197 Diarrhea, unspecified: Secondary | ICD-10-CM

## 2014-05-15 DIAGNOSIS — R634 Abnormal weight loss: Secondary | ICD-10-CM

## 2014-05-15 NOTE — Progress Notes (Signed)
   Subjective:    Patient ID: [name], male    DOB: 07/13/1921, 81 y.o.   MRN: [id number]  Diarrhea  This is a new problem. The current episode started today. Episode frequency: 4 times today. He has tried nothing for the symptoms.   intermittent abdominal pain for the past 8 weeks. More so in the left side and lower abdomen. Weight loss over the past several months. History of cancer in the past.   Weight loss noted. Review of Systems  Gastrointestinal: Positive for diarrhea.   denies bloody stools denies vomiting relates abdominal pain and discomfort.     Objective:   Physical Exam Lungs are clear hearts regular abdomen moderate abdominal tenderness left side of the abdomen lower abdomen  25 minutes spent with patient     Assessment & Plan:  Progressive weight loss-CT scan of the abdomen because of weight loss as well as abdominal pain and discomfort. Weight loss been over the past 6 months greater than 15 pounds. Patient has had intermittent abdominal pains over the past couple months. Has a history of lung cancer. Hopefully this is not a reoccurrence. He sees his specialists in the fall.

## 2014-05-16 ENCOUNTER — Other Ambulatory Visit: Payer: Self-pay | Admitting: Family Medicine

## 2014-05-16 MED ORDER — VANCOMYCIN HCL 125 MG PO CAPS: 125.0000 mg | ORAL_CAPSULE | Freq: Four times a day (QID) | ORAL | Status: DC

## 2014-05-16 NOTE — Progress Notes (Signed)
Pt's son was contacted and verbalized understanding.

## 2014-05-16 NOTE — Progress Notes (Signed)
Please let pt know I spoke with Pharmavist at [facility], med sent into his pharmacy this afternoon. Vancomycin one 4 times a day for 10 days, rec recheck ov in 2 weeks

## 2014-05-17 ENCOUNTER — Other Ambulatory Visit: Payer: Self-pay | Admitting: Family Medicine

## 2014-05-17 ENCOUNTER — Ambulatory Visit (HOSPITAL_COMMUNITY)
Admission: RE | Admit: 2014-05-17 | Discharge: 2014-05-17 | Disposition: A | Discharge status: Home / Self Care | Payer: Medicare Other | Source: Ambulatory Visit | Attending: Family Medicine | Admitting: Family Medicine

## 2014-05-17 DIAGNOSIS — K5730 Diverticulosis of large intestine without perforation or abscess without bleeding: Secondary | ICD-10-CM | POA: Diagnosis not present

## 2014-05-17 DIAGNOSIS — R634 Abnormal weight loss: Secondary | ICD-10-CM | POA: Diagnosis not present

## 2014-05-17 DIAGNOSIS — R1030 Lower abdominal pain, unspecified: Secondary | ICD-10-CM | POA: Diagnosis not present

## 2014-05-17 DIAGNOSIS — K8020 Calculus of gallbladder without cholecystitis without obstruction: Secondary | ICD-10-CM | POA: Diagnosis not present

## 2014-05-17 DIAGNOSIS — R197 Diarrhea, unspecified: Secondary | ICD-10-CM | POA: Insufficient documentation

## 2014-05-17 LAB — CBC WITH DIFFERENTIAL/PLATELET
Basophils Absolute: 0 10*3/uL (ref 0.0–0.1)
Basophils Relative: 0 % (ref 0–1)
EOS PCT: 2 % (ref 0–5)
Eosinophils Absolute: 0.2 10*3/uL (ref 0.0–0.7)
HCT: 29.9 % — ABNORMAL LOW (ref 39.0–52.0)
Hemoglobin: 10.1 g/dL — ABNORMAL LOW (ref 13.0–17.0)
Lymphocytes Relative: 23 % (ref 12–46)
Lymphs Abs: 2.1 10*3/uL (ref 0.7–4.0)
MCH: 31 pg (ref 26.0–34.0)
MCHC: 33.8 g/dL (ref 30.0–36.0)
MCV: 91.7 fL (ref 78.0–100.0)
MPV: 10.3 fL (ref 8.6–12.4)
Monocytes Absolute: 1.4 10*3/uL — ABNORMAL HIGH (ref 0.1–1.0)
Monocytes Relative: 15 % — ABNORMAL HIGH (ref 3–12)
Neutro Abs: 5.5 10*3/uL (ref 1.7–7.7)
Neutrophils Relative %: 60 % (ref 43–77)
Platelets: 198 10*3/uL (ref 150–400)
RBC: 3.26 MIL/uL — ABNORMAL LOW (ref 4.22–5.81)
RDW: 14.3 % (ref 11.5–15.5)
WBC: 9.1 10*3/uL (ref 4.0–10.5)

## 2014-05-17 LAB — HEPATIC FUNCTION PANEL
ALK PHOS: 91 U/L (ref 39–117)
ALT: 13 U/L (ref 0–53)
AST: 21 U/L (ref 0–37)
Albumin: 3.6 g/dL (ref 3.5–5.2)
Bilirubin, Direct: 0.2 mg/dL (ref 0.0–0.3)
Indirect Bilirubin: 0.4 mg/dL (ref 0.2–1.2)
Total Bilirubin: 0.6 mg/dL (ref 0.2–1.2)
Total Protein: 6.1 g/dL (ref 6.0–8.3)

## 2014-05-17 LAB — FERRITIN: FERRITIN: 91 ng/mL (ref 22–322)

## 2014-05-18 LAB — CLOSTRIDIUM DIFFICILE BY PCR: CDIFFPCR: NOT DETECTED

## 2014-05-18 LAB — SEDIMENTATION RATE: SED RATE: 19 mm/h — AB (ref 0–16)

## 2014-05-18 LAB — FECAL LACTOFERRIN, QUANT: Lactoferrin: NEGATIVE

## 2014-05-18 LAB — TISSUE TRANSGLUTAMINASE, IGA: TISSUE TRANSGLUTAMINASE AB, IGA: 1 U/mL (ref <4)

## 2014-05-21 LAB — STOOL CULTURE

## 2014-05-29 ENCOUNTER — Encounter: Payer: Self-pay | Admitting: Family Medicine

## 2014-05-29 ENCOUNTER — Ambulatory Visit (INDEPENDENT_AMBULATORY_CARE_PROVIDER_SITE_OTHER): Payer: Medicare Other | Admitting: Family Medicine

## 2014-05-29 VITALS — BP 116/72 | Ht 67.0 in | Wt 156.0 lb

## 2014-05-29 DIAGNOSIS — R109 Unspecified abdominal pain: Secondary | ICD-10-CM | POA: Diagnosis not present

## 2014-05-29 NOTE — Progress Notes (Signed)
   Subjective:    Patient ID: [name], male    DOB: Aug 28, 1921, 81 y.o.   MRN: [id number]  HPI  Patient arrives for a follow up on abd pain and weight loss. Patient finished antibiotics on Sunday. No fever no bloody stools no abdominal pain states he eats breakfast and lunch but often skips supper Review of Systems  Constitutional: Negative for activity change, appetite change and fatigue.  HENT: Negative for congestion.   Respiratory: Negative for cough.   Cardiovascular: Negative for chest pain.  Gastrointestinal: Negative for abdominal pain.  Endocrine: Negative for polydipsia and polyphagia.  Neurological: Negative for weakness.  Psychiatric/Behavioral: Negative for confusion.       Objective:   Physical Exam  Constitutional: He appears well-nourished. No distress.  Cardiovascular: Normal rate, regular rhythm and normal heart sounds.   No murmur heard. Pulmonary/Chest: Effort normal and breath sounds normal. No respiratory distress.  Abdominal: Soft. He exhibits no distension. There is no tenderness.  Musculoskeletal: He exhibits no edema.  Lymphadenopathy:    He has no cervical adenopathy.  Neurological: He is alert.  Psychiatric: His behavior is normal.  Vitals reviewed.         Assessment & Plan:  Abdominal pain-resolved Diarrhea-resolved Weight is holding steady 1 pound down compared where he was gentleman is not eating at suppertime I encouraged him to increase the amount of calories he is taking in follow-up within 6-8 weeks His lung cancer is in remission they are following him up in August at [facility]

## 2014-05-29 NOTE — Patient Instructions (Signed)
Eat at all three meals  Eat rich but avoid excessive sugars

## 2014-06-19 DIAGNOSIS — Q828 Other specified congenital malformations of skin: Secondary | ICD-10-CM | POA: Diagnosis not present

## 2014-07-14 ENCOUNTER — Other Ambulatory Visit: Payer: Self-pay | Admitting: Family Medicine

## 2014-07-20 ENCOUNTER — Encounter: Payer: Self-pay | Admitting: Family Medicine

## 2014-07-20 ENCOUNTER — Ambulatory Visit (INDEPENDENT_AMBULATORY_CARE_PROVIDER_SITE_OTHER): Payer: Medicare Other | Admitting: Family Medicine

## 2014-07-20 VITALS — BP 128/72 | Ht 67.0 in | Wt 158.0 lb

## 2014-07-20 DIAGNOSIS — J301 Allergic rhinitis due to pollen: Secondary | ICD-10-CM | POA: Diagnosis not present

## 2014-07-20 DIAGNOSIS — G250 Essential tremor: Secondary | ICD-10-CM

## 2014-07-20 DIAGNOSIS — N183 Chronic kidney disease, stage 3 unspecified: Secondary | ICD-10-CM

## 2014-07-20 DIAGNOSIS — I10 Essential (primary) hypertension: Secondary | ICD-10-CM

## 2014-07-20 DIAGNOSIS — Z79899 Other long term (current) drug therapy: Secondary | ICD-10-CM

## 2014-07-20 DIAGNOSIS — J438 Other emphysema: Secondary | ICD-10-CM | POA: Diagnosis not present

## 2014-07-20 DIAGNOSIS — R251 Tremor, unspecified: Secondary | ICD-10-CM | POA: Diagnosis not present

## 2014-07-20 DIAGNOSIS — E785 Hyperlipidemia, unspecified: Secondary | ICD-10-CM | POA: Diagnosis not present

## 2014-07-20 DIAGNOSIS — G252 Other specified forms of tremor: Secondary | ICD-10-CM

## 2014-07-20 MED ORDER — ATORVASTATIN CALCIUM 40 MG PO TABS: 40.0000 mg | ORAL_TABLET | Freq: Every day | ORAL | Status: DC

## 2014-07-20 MED ORDER — TORSEMIDE 20 MG PO TABS: ORAL_TABLET | ORAL | Status: DC

## 2014-07-20 MED ORDER — ALBUTEROL SULFATE HFA 108 (90 BASE) MCG/ACT IN AERS: 2.0000 | INHALATION_SPRAY | Freq: Four times a day (QID) | RESPIRATORY_TRACT | Status: DC | PRN

## 2014-07-20 NOTE — Progress Notes (Signed)
   Subjective:    Patient ID: [name], male    DOB: [birth year], 81 y.o.   MRN: [id number]  HPI Patient is here today for a 3 month check up.  He c/o allergies  the patient is having moderate allergies head congestion runny nose sneezing not taking anything for   he does have COPD he denies any increase in shortness of breath states it's at its baseline no hemoptysis    patient has history of chronic kidney disease denies excessive swelling in his legs tries watch salt in his diet area  Has a history of lung cancer sees a specialist later in the summer he does admit that his weight is gone down but he states it's because he is eating healthier.    patient does have history hyperlipidemia takes his medicine on a regular basis not having any problems with  He denies being depressed.   He does relate his tremors are stable. He is able to function with them. No other concerns.    Review of Systems  Constitutional: Negative for activity change, appetite change and fatigue.  HENT: Negative for congestion.   Respiratory: Negative for cough.   Cardiovascular: Negative for chest pain.  Gastrointestinal: Negative for abdominal pain.  Endocrine: Negative for polydipsia and polyphagia.  Neurological: Negative for weakness.  Psychiatric/Behavioral: Negative for confusion.       Objective:   Physical Exam  Constitutional: He appears well-nourished. No distress.  Cardiovascular: Normal rate, regular rhythm and normal heart sounds.   No murmur heard. Pulmonary/Chest: Effort normal and breath sounds normal. No respiratory distress.  Musculoskeletal: He exhibits no edema.  Lymphadenopathy:    He has no cervical adenopathy.  Neurological: He is alert.  Psychiatric: His behavior is normal.  Vitals reviewed.         Assessment & Plan:  History renal insufficiency avoid excessive proteins drink plenty of liquids. Keep medicines as is check metabolic 7  History hyperlipidemia  check lipid liver profile await the results watch diet closely  HTN good control today  Tremor this is at where it typically is continue current measures.  Significant allergies loratadine and Flonase as directed  COPD stable currently no sign of any type of infection  25 minutes spent with patient greater than half in discussion of all these issues [id number]

## 2014-07-21 DIAGNOSIS — E785 Hyperlipidemia, unspecified: Secondary | ICD-10-CM | POA: Diagnosis not present

## 2014-07-21 DIAGNOSIS — Z79899 Other long term (current) drug therapy: Secondary | ICD-10-CM | POA: Diagnosis not present

## 2014-07-21 DIAGNOSIS — I10 Essential (primary) hypertension: Secondary | ICD-10-CM | POA: Diagnosis not present

## 2014-07-22 LAB — BASIC METABOLIC PANEL
BUN/Creatinine Ratio: 17 (ref 10–22)
BUN: 35 mg/dL (ref 10–36)
CHLORIDE: 103 mmol/L (ref 97–108)
CO2: 23 mmol/L (ref 18–29)
Calcium: 9.1 mg/dL (ref 8.6–10.2)
Creatinine, Ser: 2.03 mg/dL — ABNORMAL HIGH (ref 0.76–1.27)
GFR, EST AFRICAN AMERICAN: 32 mL/min/{1.73_m2} — AB (ref >59)
GFR, EST NON AFRICAN AMERICAN: 28 mL/min/{1.73_m2} — AB (ref >59)
GLUCOSE: 108 mg/dL — AB (ref 65–99)
POTASSIUM: 4 mmol/L (ref 3.5–5.2)
Sodium: 143 mmol/L (ref 134–144)

## 2014-07-22 LAB — LIPID PANEL
Chol/HDL Ratio: 1.7 ratio units (ref 0.0–5.0)
Cholesterol, Total: 168 mg/dL (ref 100–199)
HDL: 101 mg/dL (ref >39)
LDL CALC: 54 mg/dL (ref 0–99)
Triglycerides: 65 mg/dL (ref 0–149)
VLDL CHOLESTEROL CAL: 13 mg/dL (ref 5–40)

## 2014-07-22 LAB — HEPATIC FUNCTION PANEL
ALK PHOS: 100 IU/L (ref 39–117)
ALT: 16 IU/L (ref 0–44)
AST: 24 IU/L (ref 0–40)
Albumin: 3.9 g/dL (ref 3.2–4.6)
BILIRUBIN, DIRECT: 0.18 mg/dL (ref 0.00–0.40)
Bilirubin Total: 0.5 mg/dL (ref 0.0–1.2)
TOTAL PROTEIN: 6.4 g/dL (ref 6.0–8.5)

## 2014-07-24 DIAGNOSIS — H04129 Dry eye syndrome of unspecified lacrimal gland: Secondary | ICD-10-CM | POA: Diagnosis not present

## 2014-07-24 DIAGNOSIS — H5201 Hypermetropia, right eye: Secondary | ICD-10-CM | POA: Diagnosis not present

## 2014-07-24 DIAGNOSIS — H52223 Regular astigmatism, bilateral: Secondary | ICD-10-CM | POA: Diagnosis not present

## 2014-07-24 DIAGNOSIS — H524 Presbyopia: Secondary | ICD-10-CM | POA: Diagnosis not present

## 2014-07-24 DIAGNOSIS — H02055 Trichiasis without entropian left lower eyelid: Secondary | ICD-10-CM | POA: Diagnosis not present

## 2014-07-31 DIAGNOSIS — Q828 Other specified congenital malformations of skin: Secondary | ICD-10-CM | POA: Diagnosis not present

## 2014-08-23 ENCOUNTER — Other Ambulatory Visit: Payer: Self-pay | Admitting: Family Medicine

## 2014-09-11 DIAGNOSIS — Q828 Other specified congenital malformations of skin: Secondary | ICD-10-CM | POA: Diagnosis not present

## 2014-09-11 DIAGNOSIS — M79672 Pain in left foot: Secondary | ICD-10-CM | POA: Diagnosis not present

## 2014-10-08 ENCOUNTER — Other Ambulatory Visit: Payer: Self-pay | Admitting: Family Medicine

## 2014-10-08 ENCOUNTER — Telehealth: Payer: Self-pay | Admitting: Family Medicine

## 2014-10-08 MED ORDER — ALBUTEROL SULFATE HFA 108 (90 BASE) MCG/ACT IN AERS: 2.0000 | INHALATION_SPRAY | Freq: Four times a day (QID) | RESPIRATORY_TRACT | Status: DC | PRN

## 2014-10-08 NOTE — Telephone Encounter (Signed)
Pt needs a three month supply of his ventolin inhaler sent to [facility] pharmacy in [place].

## 2014-10-08 NOTE — Telephone Encounter (Signed)
Rx sent electronically to pharmacy. Patient notified. 

## 2014-10-23 DIAGNOSIS — L97511 Non-pressure chronic ulcer of other part of right foot limited to breakdown of skin: Secondary | ICD-10-CM | POA: Diagnosis not present

## 2014-11-18 ENCOUNTER — Emergency Department (HOSPITAL_COMMUNITY)
Admission: EM | Admit: 2014-11-18 | Discharge: 2014-11-18 | Disposition: A | Discharge status: Home / Self Care | Payer: Medicare Other | Attending: Emergency Medicine | Admitting: Emergency Medicine

## 2014-11-18 ENCOUNTER — Encounter (HOSPITAL_COMMUNITY): Payer: Self-pay | Admitting: Emergency Medicine

## 2014-11-18 ENCOUNTER — Emergency Department (HOSPITAL_COMMUNITY): Payer: Medicare Other

## 2014-11-18 DIAGNOSIS — Z85118 Personal history of other malignant neoplasm of bronchus and lung: Secondary | ICD-10-CM | POA: Insufficient documentation

## 2014-11-18 DIAGNOSIS — Q631 Lobulated, fused and horseshoe kidney: Secondary | ICD-10-CM | POA: Insufficient documentation

## 2014-11-18 DIAGNOSIS — I129 Hypertensive chronic kidney disease with stage 1 through stage 4 chronic kidney disease, or unspecified chronic kidney disease: Secondary | ICD-10-CM | POA: Diagnosis not present

## 2014-11-18 DIAGNOSIS — E785 Hyperlipidemia, unspecified: Secondary | ICD-10-CM | POA: Diagnosis not present

## 2014-11-18 DIAGNOSIS — Z880 Allergy status to penicillin: Secondary | ICD-10-CM | POA: Insufficient documentation

## 2014-11-18 DIAGNOSIS — Z87891 Personal history of nicotine dependence: Secondary | ICD-10-CM | POA: Diagnosis not present

## 2014-11-18 DIAGNOSIS — I4510 Unspecified right bundle-branch block: Secondary | ICD-10-CM | POA: Diagnosis not present

## 2014-11-18 DIAGNOSIS — R197 Diarrhea, unspecified: Secondary | ICD-10-CM | POA: Insufficient documentation

## 2014-11-18 DIAGNOSIS — Z7951 Long term (current) use of inhaled steroids: Secondary | ICD-10-CM | POA: Diagnosis not present

## 2014-11-18 DIAGNOSIS — R110 Nausea: Secondary | ICD-10-CM | POA: Insufficient documentation

## 2014-11-18 DIAGNOSIS — J441 Chronic obstructive pulmonary disease with (acute) exacerbation: Secondary | ICD-10-CM | POA: Insufficient documentation

## 2014-11-18 DIAGNOSIS — Z79899 Other long term (current) drug therapy: Secondary | ICD-10-CM | POA: Diagnosis not present

## 2014-11-18 DIAGNOSIS — R000 Tachycardia, unspecified: Secondary | ICD-10-CM | POA: Diagnosis not present

## 2014-11-18 DIAGNOSIS — G250 Essential tremor: Secondary | ICD-10-CM | POA: Insufficient documentation

## 2014-11-18 DIAGNOSIS — M6281 Muscle weakness (generalized): Secondary | ICD-10-CM | POA: Insufficient documentation

## 2014-11-18 DIAGNOSIS — I2510 Atherosclerotic heart disease of native coronary artery without angina pectoris: Secondary | ICD-10-CM | POA: Insufficient documentation

## 2014-11-18 DIAGNOSIS — N182 Chronic kidney disease, stage 2 (mild): Secondary | ICD-10-CM | POA: Insufficient documentation

## 2014-11-18 LAB — COMPREHENSIVE METABOLIC PANEL
ALK PHOS: 82 U/L (ref 38–126)
ALT: 16 U/L — ABNORMAL LOW (ref 17–63)
ANION GAP: 7 (ref 5–15)
AST: 25 U/L (ref 15–41)
Albumin: 3.9 g/dL (ref 3.5–5.0)
BILIRUBIN TOTAL: 0.9 mg/dL (ref 0.3–1.2)
BUN: 33 mg/dL — AB (ref 6–20)
CO2: 28 mmol/L (ref 22–32)
Calcium: 8.8 mg/dL — ABNORMAL LOW (ref 8.9–10.3)
Chloride: 104 mmol/L (ref 101–111)
Creatinine, Ser: 2.02 mg/dL — ABNORMAL HIGH (ref 0.61–1.24)
GFR, EST AFRICAN AMERICAN: 32 mL/min — AB (ref >60)
GFR, EST NON AFRICAN AMERICAN: 27 mL/min — AB (ref >60)
Glucose, Bld: 125 mg/dL — ABNORMAL HIGH (ref 65–99)
Potassium: 4.2 mmol/L (ref 3.5–5.1)
SODIUM: 139 mmol/L (ref 135–145)
TOTAL PROTEIN: 6.6 g/dL (ref 6.5–8.1)

## 2014-11-18 LAB — CBC WITH DIFFERENTIAL/PLATELET
Basophils Absolute: 0 10*3/uL (ref 0.0–0.1)
Basophils Relative: 0 % (ref 0–1)
Eosinophils Absolute: 0.3 10*3/uL (ref 0.0–0.7)
Eosinophils Relative: 5 % (ref 0–5)
HCT: 33.2 % — ABNORMAL LOW (ref 39.0–52.0)
HEMOGLOBIN: 10.6 g/dL — AB (ref 13.0–17.0)
LYMPHS ABS: 2 10*3/uL (ref 0.7–4.0)
LYMPHS PCT: 28 % (ref 12–46)
MCH: 30.9 pg (ref 26.0–34.0)
MCHC: 31.9 g/dL (ref 30.0–36.0)
MCV: 96.8 fL (ref 78.0–100.0)
Monocytes Absolute: 0.8 10*3/uL (ref 0.1–1.0)
Monocytes Relative: 11 % (ref 3–12)
NEUTROS ABS: 3.9 10*3/uL (ref 1.7–7.7)
Neutrophils Relative %: 56 % (ref 43–77)
PLATELETS: 183 10*3/uL (ref 150–400)
RBC: 3.43 MIL/uL — ABNORMAL LOW (ref 4.22–5.81)
RDW: 14.9 % (ref 11.5–15.5)
WBC: 7.1 10*3/uL (ref 4.0–10.5)

## 2014-11-18 LAB — TROPONIN I: Troponin I: <0.03 ng/mL (ref <0.031)

## 2014-11-18 MED ORDER — IPRATROPIUM-ALBUTEROL 0.5-2.5 (3) MG/3ML IN SOLN
3.0000 mL | Freq: Once | RESPIRATORY_TRACT | Status: AC
  Administered 2014-11-18: 3 mL via RESPIRATORY_TRACT
  Filled 2014-11-18: qty 3

## 2014-11-18 MED ORDER — ONDANSETRON HCL 4 MG PO TABS: 4.0000 mg | ORAL_TABLET | Freq: Three times a day (TID) | ORAL | Status: DC | PRN

## 2014-11-18 MED ORDER — SODIUM CHLORIDE 0.9 % IV BOLUS (SEPSIS)
500.0000 mL | Freq: Once | INTRAVENOUS | Status: AC
  Administered 2014-11-18: 500 mL via INTRAVENOUS

## 2014-11-18 NOTE — ED Provider Notes (Signed)
CSN: [id number]     Arrival date & time 11/18/14  0802 History   First MD Initiated Contact with Patient 11/18/14 [phone]     Chief Complaint  Patient presents with  . Diarrhea     (Consider location/radiation/quality/duration/timing/severity/associated sxs/prior Treatment) The history is provided by the patient.     Pt with hx COPD, CAD (s/p CABG 1995), CKD, hx lung CA s/p radiation, HLD p/w increased fatigue, generalized weakness, nausea, diarrhea, increased SOB since yesterday.  Pt notes his symptoms began yesterday afternoon with nausea and one episode of diarrhea.  This morning he had 4 episodes of diarrhea "back to back" and generalized weakness.  Has SOB at baseline with his COPD but has been worse over the past two days.  No change in his sputum.  Denies fevers, chills, myalgias, chest pain, abdominal pain, vomiting, leg swelling, orthopnea, lightheadedness, dizziness.   No hx abdominal surgeries.  Denies sick contacts, abnormal foods, recent travel.  Does note significant increase in amount of fruit he has eaten this week.  Denies bloody or black stools or bloody sputum.  Denies any change in his medications recently, has used his inhalers less than usual recently.    PCP Dr Wolfgang Phoenix.   Past Medical History  Diagnosis Date  . ASCVD (arteriosclerotic cardiovascular disease)      CABG in 04/1993; and negative stress nuclear study in 08/2001  . Hyperlipidemia   . Syncope   . Hypertension   . Tobacco abuse, in remission     40 pack years; discontinued in 1980  . Cerebrovascular disease     Right carotid bruit-40-69% left internal carotid artery stenosis in 4/06; followed [facility]  . Chronic kidney disease, stage II (mild)     Creatinine-1.6 in 09/2008  . Degenerative joint disease of knee, left   . Normocytic anemia   . Pulmonary disease   . Cancer     skin  . Lung cancer 2013  . Prediabetes   . CAD (coronary artery disease)   . COPD (chronic obstructive pulmonary disease)   . Benign  essential tremor   . Horseshoe kidney   . Renal insufficiency    Past Surgical History  Procedure Laterality Date  . Coronary artery bypass graft  1995  . Colonoscopy w/ polypectomy  1985  . Pilonidal cyst excision  1948  . Lesion excision    . Cardiac surgery     Family History  Problem Relation Age of Onset  . Cancer Brother   . Heart attack Brother    History  Substance Use Topics  . Smoking status: Former Smoker -- 1.00 packs/day for 40 years    Types: Cigarettes    Quit date: 08/14/1973  . Smokeless tobacco: Former Systems developer    Types: [place] date: 12/05/1980  . Alcohol Use: No    Review of Systems  All other systems reviewed and are negative.     Allergies  Beta adrenergic blockers; Levaquin; Penicillins; and Sulfonamide derivatives  Home Medications   Prior to Admission medications   Medication Sig Start Date End Date Taking? Authorizing Provider  atorvastatin (LIPITOR) 40 MG tablet Take 1 tablet (40 mg total) by mouth daily. 07/20/14   Kathyrn Drown, MD  fluticasone (FLONASE) 50 MCG/ACT nasal spray Place 1 spray into the nose daily.     Historical Provider, MD  KLOR-CON M10 10 MEQ tablet TAKE 1 TABLET BY MOUTH TWICE A DAY 07/14/14   Kathyrn Drown, MD  metoprolol (LOPRESSOR)  50 MG tablet TAKE 1 TABLET (50 MG TOTAL) BY MOUTH DAILY. 05/16/14   Kathyrn Drown, MD  Multiple Vitamin (MULTIVITAMIN WITH MINERALS) TABS Take 1 tablet by mouth daily.    Historical Provider, MD  nitroGLYCERIN (NITROSTAT) 0.4 MG SL tablet Place 1 tablet (0.4 mg total) under the tongue every 5 (five) minutes as needed for chest pain. 09/19/13   Kathyrn Drown, MD  potassium chloride (K-DUR,KLOR-CON) 10 MEQ tablet Take 10 mEq by mouth 2 (two) times daily.    Historical Provider, MD  SYMBICORT 160-4.5 MCG/ACT inhaler INHALE 1 PUFF INTO THE LUNGS 2 (TWO) TIMES DAILY. 08/23/14   Kathyrn Drown, MD  terazosin (HYTRIN) 5 MG capsule TAKE 1 CAPSULE (5 MG TOTAL) BY MOUTH AT BEDTIME. 05/16/14   Kathyrn Drown, MD  tetrahydrozoline-zinc (VISINE-AC) 0.05-0.25 % ophthalmic solution Place 1 drop into both eyes daily.     Historical Provider, MD  torsemide (DEMADEX) 20 MG tablet TAKE 1 TABLET IN THE MORNING AND TAKE 1 TABLET AT NOON 07/20/14   Kathyrn Drown, MD  VENTOLIN HFA 108 (90 BASE) MCG/ACT inhaler INHALE 2 PUFFS INTO THE LUNGS EVERY 6 HOURS AS NEEDED FOR SHORTNESS OF BREATH 10/08/14   Kathyrn Drown, MD   BP 167/75 mmHg  Pulse 62  Temp(Src) 97.8 F (36.6 C) (Oral)  Resp 18  Ht '5\' 7"'$  (1.702 m)  Wt 150 lb (68.04 kg)  BMI 23.49 kg/m2  SpO2 99% Physical Exam  Constitutional: He appears well-developed and well-nourished. No distress.  HENT:  Head: Normocephalic and atraumatic.  Neck: Neck supple.  Cardiovascular: Normal rate, regular rhythm and intact distal pulses.   Pulmonary/Chest: Effort normal. No respiratory distress. He has wheezes. He has no rales.  Abdominal: Soft. Bowel sounds are normal. He exhibits no distension and no mass. There is no tenderness. There is no rebound and no guarding.  Musculoskeletal: He exhibits no edema.  Neurological: He is alert. He exhibits normal muscle tone.  Skin: No rash noted. He is not diaphoretic.  Psychiatric: He has a normal mood and affect. His behavior is normal.  Nursing note and vitals reviewed.   ED Course  Procedures (including critical care time) Labs Review Labs Reviewed  COMPREHENSIVE METABOLIC PANEL - Abnormal; Notable for the following:    Glucose, Bld 125 (*)    BUN 33 (*)    Creatinine, Ser 2.02 (*)    Calcium 8.8 (*)    ALT 16 (*)    GFR calc non Af Amer 27 (*)    GFR calc Af Amer 32 (*)    All other components within normal limits  CBC WITH DIFFERENTIAL/PLATELET - Abnormal; Notable for the following:    RBC 3.43 (*)    Hemoglobin 10.6 (*)    HCT 33.2 (*)    All other components within normal limits  C DIFFICILE QUICK SCAN W PCR REFLEX  TROPONIN I  TROPONIN I    Imaging Review Dg Chest 2 View  11/18/2014    CLINICAL DATA:  Short of breath. Tachycardia. History of lung carcinoma.  EXAM: CHEST  2 VIEW  COMPARISON:  02/19/2014  FINDINGS: Changes from CABG surgery are stable. Cardiac silhouette is normal in size. No mediastinal or hilar masses or convincing adenopathy.  Pleural parenchymal scarring is noted at the left apex with retraction of the left hila superiorly, stable.  Lungs are hyperexpanded. Mild scarring is noted in the lung bases no lung consolidation or edema. No pleural effusion or pneumothorax  Bony thorax is demineralized but grossly intact.  IMPRESSION: No acute cardiopulmonary disease. Stable appearance from the prior study.   Electronically Signed   By: Lajean Manes M.D.   On: 11/18/2014 09:12     EKG Interpretation   Date/Time:  Sunday November 18 2014 08:11:03 EDT Ventricular Rate:  59 PR Interval:  178 QRS Duration: 155 QT Interval:  479 QTC Calculation: 474 R Axis:   -129 Text Interpretation:  Ectopic atrial rhythm Right bundle branch block -  new compared to prior Confirmed by Hazle Coca [phone]) on 11/18/2014 8:19:55  AM       8:25 AM Discussed pt with Dr Ralene Bathe.    10:09 AM Pt also seen and examined by Dr Ralene Bathe who agrees with workup and plan.  Will PO challenge and ambulate. +delta troponin.  Anticipate discharge.    Repeat examination of lungs: CTAB.  Pt reports he feels much better after nebs and IVF. Tolerating PO.  Discussed all results and plan.    12:40 PM Pt continues to feel well, tolerating PO fluids, no further diarrhea.    MDM   Final diagnoses:  Right bundle branch block (RBBB)  Diarrhea    Afebrile nontoxic 81 year old male with hx CAD and COPD with increased SOB since yesterday, also with generalized weakness, nausea, 5 episodes of nonbloody diarrhea since yesterday.  Labs at baseline.  Troponins x 2 negative.  CXR unremarkable.  EKG with new RBBB.  Pt given neb treatment, IVF with great improvement.  NO further diarrhea.  Suspect increase in fruit this  week may have cause or contributed to bowel changes.  He has no abdominal pain or tenderness.  Lungs CTAB after neb treatment.  No significant SOB.  Pt denies CP.  Doubt ACS.  D/C home with PCP follow up, pt advised will need follow up with PCP for new RBBB.  Discussed return precautions.  Zofran prescription for use PRN.  Discussed result, findings, treatment, and follow up  with patient and family members.  Pt given return precautions.  Pt verbalizes understanding and agrees with plan.        Clayton Bibles, PA-C 11/18/14 [place], MD 11/18/14 [phone]

## 2014-11-18 NOTE — ED Notes (Signed)
Given Sprite for PO challenge.

## 2014-11-18 NOTE — ED Notes (Signed)
Patient with no complaints at this time. Respirations even and unlabored. Skin warm/dry. Discharge instructions reviewed with patient at this time. Patient given opportunity to voice concerns/ask questions. IV removed per policy and band-aid applied to site. Patient discharged at this time and left Emergency Department via wheelchair.  

## 2014-11-18 NOTE — Discharge Instructions (Signed)
Read the information below.  Use the prescribed medication as directed.  Please discuss all new medications with your pharmacist.  You may return to the Emergency Department at any time for worsening condition or any new symptoms that concern you.    If you develop high fevers, abdominal pain, uncontrolled vomiting, or are unable to tolerate fluids by mouth, return to the ER for a recheck.      Diarrhea Diarrhea is frequent loose and watery bowel movements. It can cause you to feel weak and dehydrated. Dehydration can cause you to become tired and thirsty, have a dry mouth, and have decreased urination that often is dark yellow. Diarrhea is a sign of another problem, most often an infection that will not last long. In most cases, diarrhea typically lasts 2-3 days. However, it can last longer if it is a sign of something more serious. It is important to treat your diarrhea as directed by your caregiver to lessen or prevent future episodes of diarrhea. CAUSES  Some common causes include:  Gastrointestinal infections caused by viruses, bacteria, or parasites.  Food poisoning or food allergies.  Certain medicines, such as antibiotics, chemotherapy, and laxatives.  Artificial sweeteners and fructose.  Digestive disorders. HOME CARE INSTRUCTIONS  Ensure adequate fluid intake (hydration): Have 1 cup (8 oz) of fluid for each diarrhea episode. Avoid fluids that contain simple sugars or sports drinks, fruit juices, whole milk products, and sodas. Your urine should be clear or pale yellow if you are drinking enough fluids. Hydrate with an oral rehydration solution that you can purchase at pharmacies, retail stores, and online. You can prepare an oral rehydration solution at home by mixing the following ingredients together:   - tsp table salt.   tsp baking soda.   tsp salt substitute containing potassium chloride.  1  tablespoons sugar.  1 L (34 oz) of water.  Certain foods and beverages may  increase the speed at which food moves through the gastrointestinal (GI) tract. These foods and beverages should be avoided and include:  Caffeinated and alcoholic beverages.  High-fiber foods, such as raw fruits and vegetables, nuts, seeds, and whole grain breads and cereals.  Foods and beverages sweetened with sugar alcohols, such as xylitol, sorbitol, and mannitol.  Some foods may be well tolerated and may help thicken stool including:  Starchy foods, such as rice, toast, pasta, low-sugar cereal, oatmeal, grits, baked potatoes, crackers, and bagels.  Bananas.  Applesauce.  Add probiotic-rich foods to help increase healthy bacteria in the GI tract, such as yogurt and fermented milk products.  Wash your hands well after each diarrhea episode.  Only take over-the-counter or prescription medicines as directed by your caregiver.  Take a warm bath to relieve any burning or pain from frequent diarrhea episodes. SEEK IMMEDIATE MEDICAL CARE IF:   You are unable to keep fluids down.  You have persistent vomiting.  You have blood in your stool, or your stools are black and tarry.  You do not urinate in 6-8 hours, or there is only a small amount of very dark urine.  You have abdominal pain that increases or localizes.  You have weakness, dizziness, confusion, or light-headedness.  You have a severe headache.  Your diarrhea gets worse or does not get better.  You have a fever or persistent symptoms for more than 2-3 days.  You have a fever and your symptoms suddenly get worse. MAKE SURE YOU:   Understand these instructions.  Will watch your condition.  Will get  help right away if you are not doing well or get worse. Document Released: 03/20/2002 Document Revised: 08/14/2013 Document Reviewed: 12/06/2011 [facility] Patient Information 2015 [place], [place]. This information is not intended to replace advice given to you by your health care provider. Make sure you discuss any  questions you have with your health care provider.

## 2014-11-18 NOTE — ED Notes (Signed)
Patient with no episodes of stool while in ED. Patient states he does not feel like he "has anything left".

## 2014-11-18 NOTE — ED Notes (Signed)
Patient c/o diarrhea. Per patient x4 loose stools this morning. Patient also reports feeling fatigue and with "slight" nausea. Family reports patient having some labored breathing, in which patient does state he feels "a little short of breath." denies any fevers.

## 2014-11-18 NOTE — ED Notes (Signed)
Pt tolerated ambulation well around nurses station with steady gait.

## 2014-11-19 ENCOUNTER — Ambulatory Visit (INDEPENDENT_AMBULATORY_CARE_PROVIDER_SITE_OTHER): Payer: Medicare Other | Admitting: Family Medicine

## 2014-11-19 ENCOUNTER — Encounter: Payer: Self-pay | Admitting: Family Medicine

## 2014-11-19 ENCOUNTER — Ambulatory Visit (HOSPITAL_COMMUNITY)
Admission: RE | Admit: 2014-11-19 | Discharge: 2014-11-19 | Disposition: A | Discharge status: Home / Self Care | Payer: Medicare Other | Source: Ambulatory Visit | Attending: Family Medicine | Admitting: Family Medicine

## 2014-11-19 VITALS — BP 110/62 | Ht 67.0 in | Wt 154.6 lb

## 2014-11-19 DIAGNOSIS — R197 Diarrhea, unspecified: Secondary | ICD-10-CM

## 2014-11-19 DIAGNOSIS — J441 Chronic obstructive pulmonary disease with (acute) exacerbation: Secondary | ICD-10-CM

## 2014-11-19 MED ORDER — PREDNISONE 20 MG PO TABS: ORAL_TABLET | ORAL | Status: DC

## 2014-11-19 NOTE — Patient Instructions (Signed)
Probiotic capsules- [facility]-take one a day for at least one month

## 2014-11-19 NOTE — Progress Notes (Signed)
   Subjective:    Patient ID: [name], male    DOB: 03-28-1922, 81 y.o.   MRN: [id number]  HPI Patient arrives for a follow up from the ER for SOB and diarrhea. Patient states he is doing better but stomach still grumbling. This patient has had increased loose stools over the past couple weeks denies high fever chills sweats denies nausea vomiting is having intermittent diarrhea watery no mucus no blood patient denies high fever chills does relate some cough and shortness of breath has history of COPD  Review of Systems  Respiratory: Positive for cough and shortness of breath. Negative for choking.   Cardiovascular: Negative for chest pain.  Gastrointestinal: Negative for abdominal pain and constipation.       Objective:   Physical Exam  Constitutional: He appears well-nourished. No distress.  Cardiovascular: Normal rate, regular rhythm and normal heart sounds.   No murmur heard. Pulmonary/Chest: Effort normal and breath sounds normal. No respiratory distress.  Abdominal: Soft. There is no tenderness. There is no rebound.  Musculoskeletal: He exhibits no edema.  Lymphadenopathy:    He has no cervical adenopathy.  Neurological: He is alert.  Psychiatric: His behavior is normal.  Vitals reviewed.         Assessment & Plan:  1. Diarrhea This patient I believe has possibly viral diarrhea I doubt C. difficile but we will check for we will also make sure because of increased amount of gas bloating that he does not have a partial obstruction - DG Abd 2 Views - Clostridium Difficile by PCR  2. COPD exacerbation Prednisone taper over the next 6 days no antibiotics indicated He will be seeing pulmonary specialist at [facility] as follow-up of his lung cancer they will be doing a CAT scan. I don't feel the patient needs any scans currently. Chest x-ray from the ER reviewed

## 2014-11-20 DIAGNOSIS — L97511 Non-pressure chronic ulcer of other part of right foot limited to breakdown of skin: Secondary | ICD-10-CM | POA: Diagnosis not present

## 2014-11-20 DIAGNOSIS — R197 Diarrhea, unspecified: Secondary | ICD-10-CM | POA: Diagnosis not present

## 2014-11-21 ENCOUNTER — Other Ambulatory Visit: Payer: Self-pay | Admitting: Family Medicine

## 2014-11-21 LAB — CLOSTRIDIUM DIFFICILE BY PCR: Toxigenic C. Difficile by PCR: NEGATIVE

## 2014-11-22 ENCOUNTER — Emergency Department (HOSPITAL_COMMUNITY)
Admission: EM | Admit: 2014-11-22 | Discharge: 2014-11-22 | Disposition: A | Discharge status: Home / Self Care | Payer: Medicare Other | Attending: Emergency Medicine | Admitting: Emergency Medicine

## 2014-11-22 ENCOUNTER — Emergency Department (HOSPITAL_COMMUNITY): Payer: Medicare Other

## 2014-11-22 ENCOUNTER — Encounter (HOSPITAL_COMMUNITY): Payer: Self-pay | Admitting: Emergency Medicine

## 2014-11-22 DIAGNOSIS — I129 Hypertensive chronic kidney disease with stage 1 through stage 4 chronic kidney disease, or unspecified chronic kidney disease: Secondary | ICD-10-CM | POA: Diagnosis not present

## 2014-11-22 DIAGNOSIS — Z85828 Personal history of other malignant neoplasm of skin: Secondary | ICD-10-CM | POA: Diagnosis not present

## 2014-11-22 DIAGNOSIS — Y9289 Other specified places as the place of occurrence of the external cause: Secondary | ICD-10-CM | POA: Insufficient documentation

## 2014-11-22 DIAGNOSIS — Z951 Presence of aortocoronary bypass graft: Secondary | ICD-10-CM | POA: Insufficient documentation

## 2014-11-22 DIAGNOSIS — Z8669 Personal history of other diseases of the nervous system and sense organs: Secondary | ICD-10-CM | POA: Insufficient documentation

## 2014-11-22 DIAGNOSIS — Z23 Encounter for immunization: Secondary | ICD-10-CM | POA: Insufficient documentation

## 2014-11-22 DIAGNOSIS — J449 Chronic obstructive pulmonary disease, unspecified: Secondary | ICD-10-CM | POA: Diagnosis not present

## 2014-11-22 DIAGNOSIS — E785 Hyperlipidemia, unspecified: Secondary | ICD-10-CM | POA: Insufficient documentation

## 2014-11-22 DIAGNOSIS — Y998 Other external cause status: Secondary | ICD-10-CM | POA: Insufficient documentation

## 2014-11-22 DIAGNOSIS — S8992XA Unspecified injury of left lower leg, initial encounter: Secondary | ICD-10-CM | POA: Insufficient documentation

## 2014-11-22 DIAGNOSIS — I2510 Atherosclerotic heart disease of native coronary artery without angina pectoris: Secondary | ICD-10-CM | POA: Insufficient documentation

## 2014-11-22 DIAGNOSIS — Z87891 Personal history of nicotine dependence: Secondary | ICD-10-CM | POA: Insufficient documentation

## 2014-11-22 DIAGNOSIS — Z79899 Other long term (current) drug therapy: Secondary | ICD-10-CM | POA: Insufficient documentation

## 2014-11-22 DIAGNOSIS — Z7982 Long term (current) use of aspirin: Secondary | ICD-10-CM | POA: Insufficient documentation

## 2014-11-22 DIAGNOSIS — Y9389 Activity, other specified: Secondary | ICD-10-CM | POA: Insufficient documentation

## 2014-11-22 DIAGNOSIS — W1839XA Other fall on same level, initial encounter: Secondary | ICD-10-CM | POA: Diagnosis not present

## 2014-11-22 DIAGNOSIS — S61412A Laceration without foreign body of left hand, initial encounter: Secondary | ICD-10-CM | POA: Diagnosis not present

## 2014-11-22 DIAGNOSIS — Z880 Allergy status to penicillin: Secondary | ICD-10-CM | POA: Diagnosis not present

## 2014-11-22 DIAGNOSIS — Q631 Lobulated, fused and horseshoe kidney: Secondary | ICD-10-CM | POA: Insufficient documentation

## 2014-11-22 DIAGNOSIS — M25562 Pain in left knee: Secondary | ICD-10-CM | POA: Diagnosis not present

## 2014-11-22 DIAGNOSIS — S6992XA Unspecified injury of left wrist, hand and finger(s), initial encounter: Secondary | ICD-10-CM | POA: Diagnosis not present

## 2014-11-22 DIAGNOSIS — N182 Chronic kidney disease, stage 2 (mild): Secondary | ICD-10-CM | POA: Insufficient documentation

## 2014-11-22 DIAGNOSIS — Z85118 Personal history of other malignant neoplasm of bronchus and lung: Secondary | ICD-10-CM | POA: Diagnosis not present

## 2014-11-22 DIAGNOSIS — Z7951 Long term (current) use of inhaled steroids: Secondary | ICD-10-CM | POA: Diagnosis not present

## 2014-11-22 DIAGNOSIS — M79642 Pain in left hand: Secondary | ICD-10-CM | POA: Diagnosis not present

## 2014-11-22 DIAGNOSIS — S50312A Abrasion of left elbow, initial encounter: Secondary | ICD-10-CM | POA: Diagnosis not present

## 2014-11-22 DIAGNOSIS — W19XXXA Unspecified fall, initial encounter: Secondary | ICD-10-CM

## 2014-11-22 DIAGNOSIS — Z862 Personal history of diseases of the blood and blood-forming organs and certain disorders involving the immune mechanism: Secondary | ICD-10-CM | POA: Diagnosis not present

## 2014-11-22 DIAGNOSIS — S50812A Abrasion of left forearm, initial encounter: Secondary | ICD-10-CM | POA: Diagnosis not present

## 2014-11-22 DIAGNOSIS — T148XXA Other injury of unspecified body region, initial encounter: Secondary | ICD-10-CM

## 2014-11-22 MED ORDER — TETANUS-DIPHTH-ACELL PERTUSSIS 5-2.5-18.5 LF-MCG/0.5 IM SUSP
0.5000 mL | Freq: Once | INTRAMUSCULAR | Status: AC
  Administered 2014-11-22: 0.5 mL via INTRAMUSCULAR
  Filled 2014-11-22: qty 0.5

## 2014-11-22 NOTE — ED Notes (Signed)
Julie-PA at bedside at this time for evaluation.

## 2014-11-22 NOTE — ED Notes (Signed)
Julie-PA at bedside for laceration repair.

## 2014-11-22 NOTE — Discharge Instructions (Signed)

## 2014-11-22 NOTE — ED Notes (Signed)
Pt was outside when he came across a snake - was attempting to get away from snake when he fell - Sustained laceration to his lt hand and lt elbow - Pt denies hurting anything else

## 2014-11-23 ENCOUNTER — Telehealth: Payer: Self-pay | Admitting: Family Medicine

## 2014-11-23 ENCOUNTER — Ambulatory Visit (INDEPENDENT_AMBULATORY_CARE_PROVIDER_SITE_OTHER): Payer: Medicare Other | Admitting: Family Medicine

## 2014-11-23 VITALS — Ht 67.0 in | Wt 154.0 lb

## 2014-11-23 DIAGNOSIS — S51012D Laceration without foreign body of left elbow, subsequent encounter: Secondary | ICD-10-CM | POA: Diagnosis not present

## 2014-11-23 NOTE — Progress Notes (Signed)
   Subjective:    Patient ID: [name], male    DOB: [birth year], 81 y.o.   MRN: [id number]  HPI Patient arrives for an ER follow for fall. Patient was running from snake and fell in yard. Patient has steri strips on left hans and petroleum bandage on elbow. ER records were reviewed in detail. X-rays reviewed Has large skin tear and avulsion on the left arm near the elbow also had a skin tear in the mid forearm area also complained of left shoulder pain right shoulder pain bilateral knee pain  Review of Systems See above    Objective:   Physical Exam  On examination there is multiple skin tears on the left arm. He is up-to-date on immunizations No excessive bleeding noted. No sign of infection. Large avulsion near the elbow smaller skin tear on the hand and on the mid forearm some soreness in the shoulders and the knees but no obvious deformity or problems noted on exam  Patient does have mild ataxia but does do a lot of walking I don't find any evidence of any type of stroke    Assessment & Plan:  Multiple contusion should gradually get better Tylenol range of motion excise recommended Compression dressings on the skin tears change this daily all what if ongoing troubles.

## 2014-11-23 NOTE — Telephone Encounter (Signed)
Patient coming in for an office visit today

## 2014-11-23 NOTE — Telephone Encounter (Signed)
pts Son Ronalee Belts called to say his dad had to go ER from a fall in the yard Trying to get away from a snake. He was treated for some scrapes an  Bruises. Could not stitch his wounds due to the thinning of his skin.  They advised him to follow up with you but he does not think he needs to.  Please advise

## 2014-11-23 NOTE — ED Provider Notes (Signed)
CSN: [id number]     Arrival date & time 11/22/14  1415 History   First MD Initiated Contact with Patient 11/22/14 1452     Chief Complaint  Patient presents with  . Extremity Laceration     (Consider location/radiation/quality/duration/timing/severity/associated sxs/prior Treatment) The history is provided by the patient.   [name] is a 81 y.o. male presenting with laceration to his left hand and elbow since tripping and falling on gravel just prior to arrival.  He was walking around his outbuilding when he stumbled across a large black snack.  He attempted to hit it when it coiled up and he fell backward landing with his left arm hitting gravel.  He denies head injury and remembers the entire event.  Also denies being bit by the snake.  His wounds have bled copiously, but are not controlled after pressure was applied.  He also reports left knee pain which he twisted when he fell.  He has been ambulatory since the fall.     Past Medical History  Diagnosis Date  . ASCVD (arteriosclerotic cardiovascular disease)      CABG in 04/1993; and negative stress nuclear study in 08/2001  . Hyperlipidemia   . Syncope   . Hypertension   . Tobacco abuse, in remission     40 pack years; discontinued in 1980  . Cerebrovascular disease     Right carotid bruit-40-69% left internal carotid artery stenosis in 4/06; followed [facility]  . Chronic kidney disease, stage II (mild)     Creatinine-1.6 in 09/2008  . Degenerative joint disease of knee, left   . Normocytic anemia   . Pulmonary disease   . Cancer     skin  . Lung cancer 2013  . Prediabetes   . CAD (coronary artery disease)   . COPD (chronic obstructive pulmonary disease)   . Benign essential tremor   . Horseshoe kidney   . Renal insufficiency    Past Surgical History  Procedure Laterality Date  . Coronary artery bypass graft  1995  . Colonoscopy w/ polypectomy  1985  . Pilonidal cyst excision  1948  . Lesion excision    . Cardiac  surgery     Family History  Problem Relation Age of Onset  . Cancer Brother   . Heart attack Brother    Social History  Substance Use Topics  . Smoking status: Former Smoker -- 1.00 packs/day for 40 years    Types: Cigarettes    Quit date: 08/14/1973  . Smokeless tobacco: Former Systems developer    Types: [place] date: 12/05/1980  . Alcohol Use: No    Review of Systems  Musculoskeletal: Positive for joint swelling and arthralgias. Negative for myalgias.  Skin: Positive for wound.  Neurological: Negative for weakness, light-headedness, numbness and headaches.      Allergies  Beta adrenergic blockers; Levaquin; Penicillins; and Sulfonamide derivatives  Home Medications   Prior to Admission medications   Medication Sig Start Date End Date Taking? Authorizing Provider  aspirin EC 81 MG tablet Take 81 mg by mouth daily.   Yes Historical Provider, MD  atorvastatin (LIPITOR) 40 MG tablet Take 1 tablet (40 mg total) by mouth daily. 07/20/14  Yes Kathyrn Drown, MD  fluticasone (FLONASE) 50 MCG/ACT nasal spray Place 1 spray into the nose daily.    Yes Historical Provider, MD  KLOR-CON M10 10 MEQ tablet TAKE 1 TABLET BY MOUTH TWICE A DAY 07/14/14  Yes Kathyrn Drown, MD  metoprolol (  LOPRESSOR) 50 MG tablet TAKE 1 TABLET (50 MG TOTAL) BY MOUTH DAILY. 11/21/14  Yes Kathyrn Drown, MD  Multiple Vitamin (MULTIVITAMIN WITH MINERALS) TABS Take 1 tablet by mouth daily.   Yes Historical Provider, MD  nitroGLYCERIN (NITROSTAT) 0.4 MG SL tablet Place 1 tablet (0.4 mg total) under the tongue every 5 (five) minutes as needed for chest pain. 09/19/13  Yes Kathyrn Drown, MD  predniSONE (DELTASONE) 20 MG tablet 3qd for 2d then 2qd for 2d then 1qd for 2d 11/19/14  Yes Kathyrn Drown, MD  SYMBICORT 160-4.5 MCG/ACT inhaler INHALE 1 PUFF INTO THE LUNGS 2 (TWO) TIMES DAILY. 08/23/14  Yes Kathyrn Drown, MD  terazosin (HYTRIN) 5 MG capsule TAKE 1 CAPSULE (5 MG TOTAL) BY MOUTH AT BEDTIME. 11/21/14  Yes Kathyrn Drown, MD   tetrahydrozoline-zinc (VISINE-AC) 0.05-0.25 % ophthalmic solution Place 1 drop into both eyes daily.    Yes Historical Provider, MD  torsemide (DEMADEX) 20 MG tablet TAKE 1 TABLET IN THE MORNING AND TAKE 1 TABLET AT NOON Patient taking differently: Take 20 mg by mouth daily.  07/20/14  Yes Scott A Luking, MD  VENTOLIN HFA 108 (90 BASE) MCG/ACT inhaler INHALE 2 PUFFS INTO THE LUNGS EVERY 6 HOURS AS NEEDED FOR SHORTNESS OF BREATH 10/08/14  Yes Kathyrn Drown, MD  ondansetron (ZOFRAN) 4 MG tablet Take 1 tablet (4 mg total) by mouth every 8 (eight) hours as needed for nausea or vomiting. Patient not taking: Reported on 11/22/2014 11/18/14   Clayton Bibles, PA-C   BP 160/65 mmHg  Pulse 78  Temp(Src) 98.5 F (36.9 C) (Oral)  Resp 18  Ht '5\' 7"'$  (1.702 m)  Wt 152 lb (68.947 kg)  BMI 23.80 kg/m2  SpO2 96% Physical Exam  Constitutional: He appears well-developed and well-nourished.  HENT:  Head: Normocephalic and atraumatic.  Right Ear: No hemotympanum.  Left Ear: No hemotympanum.  Neck: Normal range of motion. Neck supple. No spinous process tenderness and no muscular tenderness present. Normal range of motion present.  Cardiovascular: Normal rate and intact distal pulses.   Pulses equal bilaterally  Pulmonary/Chest: Breath sounds normal.  Abdominal: Soft. Bowel sounds are normal. There is no tenderness.  Musculoskeletal: He exhibits tenderness.       Left knee: He exhibits no swelling, no effusion, no erythema, normal alignment, no LCL laxity, normal meniscus and no MCL laxity. Tenderness found. Lateral joint line tenderness noted.       Left forearm: He exhibits tenderness and laceration. He exhibits no bony tenderness, no swelling, no edema and no deformity.       Left hand: He exhibits laceration. He exhibits normal range of motion, normal capillary refill, no deformity and no swelling. Normal sensation noted. Normal strength noted.  Pink "stippling" on anterior knees suggesting fall against gravel.   No breaks in the skin.  Neurological: He is alert. He has normal strength. He displays normal reflexes. No sensory deficit.  Skin: Skin is warm and dry. Laceration noted.  3 cm skin tear left dorsal hand which is fairly linear and superficial, hemostatic.  Large superficial abrasion/skin tear left volar proximal forearm  Psychiatric: He has a normal mood and affect.    ED Course  Procedures (including critical care time)  LACERATION REPAIR  Left hand Performed by: Evalee Jefferson Authorized by: Evalee Jefferson Consent: Verbal consent obtained. Risks and benefits: risks, benefits and alternatives were discussed Consent given by: patient Patient identity confirmed: provided demographic data Prepped and Draped in normal  sterile fashion Wound explored  Laceration Location: left dorsal hand  Laceration Length: 3cm  No Foreign Bodies seen or palpated  Anesthesia:none Local anesthetic: none  Anesthetic total: none Irrigation method: syringe Amount of cleaning: copious after using wound cleanser  Skin closure: sterile strips to approximate flaps  Number of sutures: #4 sterile strips  Technique: sterile strips  Patient tolerance: Patient tolerated the procedure well with no immediate complications.  Labs Review Labs Reviewed - No data to display  Imaging Review Dg Elbow Complete Left  11/22/2014   CLINICAL DATA:  Pt states he was outside today when he saw a snake. Pt was startled, lost his balance, and fell. Pt c/o Lt knee pain, Lt elbow pain with posterior abrasions, and LT hand pain with posterior abrasions at thumb.  EXAM: LEFT ELBOW - COMPLETE 3+ VIEW  COMPARISON:  None.  FINDINGS: No fracture or dislocation. No significant arthropathic change in no joint effusion. There are soft tissue lacerations posteriorly. No radiopaque foreign bodies.  IMPRESSION: No fracture or dislocation.  No radiopaque foreign body.   Electronically Signed   By: Lajean Manes M.D.   On: 11/22/2014 15:49    Dg Knee Complete 4 Views Left  11/22/2014   CLINICAL DATA:  Pt states he was outside today when he saw a snake. Pt was startled, lost his balance, and fell. Pt c/o Lt knee pain, Lt elbow pain with posterior abrasions, and LT hand pain with posterior abrasions at thumb.  EXAM: LEFT KNEE - COMPLETE 4+ VIEW  COMPARISON:  06/19/2010  FINDINGS: No fracture or dislocation.  There is tricompartmental osteoarthritis with joint space narrowing, most evident of the medial compartment, medial compartment subchondral sclerosis and marginal osteophytes from all 3 compartments.  Bones are demineralized.  There is no joint effusion.  There are dense vascular calcifications posteriorly.  IMPRESSION: 1. No fracture or acute finding. 2. Osteoarthritis.   Electronically Signed   By: Lajean Manes M.D.   On: 11/22/2014 15:48   Dg Hand Complete Left  11/22/2014   CLINICAL DATA:  Status post fall today with a left hand injury. Pain. Initial encounter.  EXAM: LEFT HAND - COMPLETE 3+ VIEW  COMPARISON:  None.  FINDINGS: No acute bony or joint abnormality is identified. Marked first CMC osteoarthritis is identified. Mild degenerative change is also seen about the IP joint of the thumb and the DIP joints of the fingers. Chondrocalcinosis of the triangular fibrocartilage is noted.  IMPRESSION: No acute abnormality.  Scattered osteoarthritis appearing worst at the first [facility] joint.  Chondrocalcinosis.   Electronically Signed   By: Inge Rise M.D.   On: 11/22/2014 15:50     EKG Interpretation None      MDM   Final diagnoses:  Fall, initial encounter  Multiple skin tears    Patients labs and/or radiological studies were reviewed and considered during the medical decision making and disposition process.   Imaging was reviewed, interpreted and I agree with radiologists reading.  Results were also discussed with patient.   Pt's left forearm abrasion/laceration also cleaned and flushed with saline, not amenable to  steri stripping with most of the flap gone.  Xeroform applied, bulky dressing.  Tetanus updated.    Plan f/u with pcp  As needed, change dressings daily.  Tetanus updated.  Pt was seen by Dr. Roderic Palau prior to dc home.    Evalee Jefferson, PA-C 11/23/14 3500  Milton Ferguson, MD 11/24/14 [phone]

## 2014-11-26 ENCOUNTER — Encounter: Payer: Self-pay | Admitting: Family Medicine

## 2014-11-26 ENCOUNTER — Ambulatory Visit (INDEPENDENT_AMBULATORY_CARE_PROVIDER_SITE_OTHER): Payer: Medicare Other | Admitting: Family Medicine

## 2014-11-26 VITALS — BP 118/64 | Temp 98.3°F | Ht 67.0 in | Wt 154.0 lb

## 2014-11-26 DIAGNOSIS — I951 Orthostatic hypotension: Secondary | ICD-10-CM | POA: Diagnosis not present

## 2014-11-26 MED ORDER — MUPIROCIN 2 % EX OINT: TOPICAL_OINTMENT | CUTANEOUS | Status: DC

## 2014-11-26 MED ORDER — TAMSULOSIN HCL 0.4 MG PO CAPS: ORAL_CAPSULE | ORAL | Status: DC

## 2014-11-26 NOTE — Progress Notes (Signed)
   Subjective:    Patient ID: [name], male    DOB: January 11, 1922, 81 y.o.   MRN: [id number]  Abdominal Pain This is a new problem. Episode onset: 2 days ago. Associated symptoms include diarrhea and nausea. Associated symptoms comments: Dizziness, wheezing, shortness of breath. Treatments tried: inhaler.    Recheck skin tears. Requesting something to put on tears because even non stick pads are sticking to skin.   Review of Systems  Gastrointestinal: Positive for nausea, abdominal pain and diarrhea.   patient denies high fever chills sweats Had diarrhea yest at lunch No fever Lungs- past few days SOB, some coughing Dizzy spells in the am lasted for 1 to 2 hours    Objective:   Physical Exam  Constitutional: He appears well-nourished. No distress.  Cardiovascular: Normal rate, regular rhythm and normal heart sounds.   No murmur heard. Pulmonary/Chest: Effort normal and breath sounds normal. No respiratory distress.  Musculoskeletal: He exhibits no edema.  Lymphadenopathy:    He has no cervical adenopathy.  Neurological: He is alert.  Psychiatric: His behavior is normal.  Vitals reviewed.   Blood pressure measured laying sitting standing significant drop probably related to medication      Assessment & Plan:  I believe the patient's dizziness related to blood pressure changes I would recommend stopping Hytrin instead use Flomax at nighttime if any problems with this notify us  Follow-up in mid September  Skin tears use Bactroban ointment. Call us sooner if any problems  Diarrhea seems to be a little bit better they will monitor

## 2014-11-29 DIAGNOSIS — C3490 Malignant neoplasm of unspecified part of unspecified bronchus or lung: Secondary | ICD-10-CM | POA: Diagnosis not present

## 2014-11-29 DIAGNOSIS — R05 Cough: Secondary | ICD-10-CM | POA: Diagnosis not present

## 2014-11-29 DIAGNOSIS — J3801 Paralysis of vocal cords and larynx, unilateral: Secondary | ICD-10-CM | POA: Diagnosis not present

## 2014-11-29 DIAGNOSIS — R911 Solitary pulmonary nodule: Secondary | ICD-10-CM | POA: Diagnosis not present

## 2014-11-29 DIAGNOSIS — Z923 Personal history of irradiation: Secondary | ICD-10-CM | POA: Diagnosis not present

## 2014-11-29 DIAGNOSIS — Z87891 Personal history of nicotine dependence: Secondary | ICD-10-CM | POA: Diagnosis not present

## 2014-11-29 DIAGNOSIS — C3412 Malignant neoplasm of upper lobe, left bronchus or lung: Secondary | ICD-10-CM | POA: Diagnosis not present

## 2014-12-22 ENCOUNTER — Other Ambulatory Visit: Payer: Self-pay | Admitting: Family Medicine

## 2015-01-01 ENCOUNTER — Other Ambulatory Visit: Payer: Self-pay | Admitting: Family Medicine

## 2015-01-01 DIAGNOSIS — L851 Acquired keratosis [keratoderma] palmaris et plantaris: Secondary | ICD-10-CM | POA: Diagnosis not present

## 2015-01-01 DIAGNOSIS — Q828 Other specified congenital malformations of skin: Secondary | ICD-10-CM | POA: Diagnosis not present

## 2015-01-01 DIAGNOSIS — M79672 Pain in left foot: Secondary | ICD-10-CM | POA: Diagnosis not present

## 2015-01-07 ENCOUNTER — Encounter: Payer: Self-pay | Admitting: Family Medicine

## 2015-01-07 ENCOUNTER — Ambulatory Visit (INDEPENDENT_AMBULATORY_CARE_PROVIDER_SITE_OTHER): Payer: Medicare Other | Admitting: Family Medicine

## 2015-01-07 VITALS — BP 124/70 | Ht 67.0 in | Wt 151.4 lb

## 2015-01-07 DIAGNOSIS — J438 Other emphysema: Secondary | ICD-10-CM | POA: Diagnosis not present

## 2015-01-07 DIAGNOSIS — R7309 Other abnormal glucose: Secondary | ICD-10-CM

## 2015-01-07 DIAGNOSIS — J019 Acute sinusitis, unspecified: Secondary | ICD-10-CM | POA: Diagnosis not present

## 2015-01-07 DIAGNOSIS — Z23 Encounter for immunization: Secondary | ICD-10-CM

## 2015-01-07 DIAGNOSIS — G252 Other specified forms of tremor: Secondary | ICD-10-CM

## 2015-01-07 DIAGNOSIS — R251 Tremor, unspecified: Secondary | ICD-10-CM

## 2015-01-07 DIAGNOSIS — G250 Essential tremor: Secondary | ICD-10-CM

## 2015-01-07 DIAGNOSIS — R7303 Prediabetes: Secondary | ICD-10-CM

## 2015-01-07 DIAGNOSIS — I10 Essential (primary) hypertension: Secondary | ICD-10-CM | POA: Diagnosis not present

## 2015-01-07 DIAGNOSIS — C3400 Malignant neoplasm of unspecified main bronchus: Secondary | ICD-10-CM | POA: Diagnosis not present

## 2015-01-07 LAB — POCT GLYCOSYLATED HEMOGLOBIN (HGB A1C): HEMOGLOBIN A1C: 5.3

## 2015-01-07 MED ORDER — DOXYCYCLINE HYCLATE 100 MG PO CAPS: 100.0000 mg | ORAL_CAPSULE | Freq: Two times a day (BID) | ORAL | Status: DC

## 2015-01-07 NOTE — Progress Notes (Addendum)
   Subjective:    Patient ID: [name], male    DOB: 1921-12-18, 81 y.o.   MRN: [id number]  Hypertension This is a chronic problem. The current episode started more than 1 year ago. Pertinent negatives include no chest pain. There are no compliance problems.    patient denies any excessive swelling denies PND denies orthopnea  States he is trying to eat on a regular basis denies any trouble with appetite. Denies reflux symptoms currently.  Patient has c/o of productive cough. Onset of symptoms 3 days ago. Patient denies any significant wheezing or shortness of breath compared where he was states the phlegm currently is more clearish phlegm. Denies high fever chills  He does relate his tremor does make it difficult to get dressed knee but he feels he is tolerating as well as hand. COPD he does uses medications on regular basis  Review of Systems  Constitutional: Negative for fever and activity change.  HENT: Positive for congestion and rhinorrhea. Negative for ear pain.   Eyes: Negative for discharge.  Respiratory: Positive for cough. Negative for wheezing.   Cardiovascular: Negative for chest pain.       Objective:   Physical Exam  Constitutional: He appears well-developed and well-nourished. No distress.  HENT:  Head: Normocephalic.  Mouth/Throat: Oropharynx is clear and moist. No oropharyngeal exudate.  Neck: Normal range of motion.  Cardiovascular: Normal rate, regular rhythm and normal heart sounds.   No murmur heard. Pulmonary/Chest: Effort normal and breath sounds normal. No respiratory distress. He has no wheezes.  Musculoskeletal: He exhibits no edema.  Lymphadenopathy:    He has no cervical adenopathy.  Neurological: He is alert. He exhibits normal muscle tone.  Skin: Skin is warm and dry.  Psychiatric: His behavior is normal.  Nursing note and vitals reviewed.         Assessment & Plan:  Lung cancer-no sign reoccurrence notes from his visit with specialist  reviewed. They will be doing CAT scan again in one years time  COPD-stable continue long-acting inhaler and albuterol when necessary  Acute bronchitis with sinusitis I believe currently this is a viral illness but if not improving over the next 4872 hours he will need to get his antibiotics filled  Tremor-tremor seems to be getting a little bit worse but patient compensating well with it.  History hyperglycemia A1c looks good today watch diet  Hyperlipidemia tolerating medication well not due for lab work currently  Blood pressure cardiac condition stable continue current medications metoprolol helping with the tremors  Currently I believe this is a viral illness but if not doing better will need antibiotics filled

## 2015-01-15 ENCOUNTER — Ambulatory Visit: Payer: Medicare Other | Admitting: Family Medicine

## 2015-02-05 ENCOUNTER — Telehealth: Payer: Self-pay | Admitting: Family Medicine

## 2015-02-05 NOTE — Telephone Encounter (Signed)
Handicapped Drivers Registration Plate   Call Ronalee Belts when ready for pick up

## 2015-02-06 NOTE — Telephone Encounter (Signed)
Pt aware, will come by to pick it up

## 2015-02-06 NOTE — Telephone Encounter (Signed)
Completed.

## 2015-02-12 DIAGNOSIS — Q828 Other specified congenital malformations of skin: Secondary | ICD-10-CM | POA: Diagnosis not present

## 2015-02-12 DIAGNOSIS — M79673 Pain in unspecified foot: Secondary | ICD-10-CM | POA: Diagnosis not present

## 2015-02-12 DIAGNOSIS — L851 Acquired keratosis [keratoderma] palmaris et plantaris: Secondary | ICD-10-CM | POA: Diagnosis not present

## 2015-02-19 ENCOUNTER — Other Ambulatory Visit: Payer: Self-pay | Admitting: Family Medicine

## 2015-02-25 ENCOUNTER — Ambulatory Visit (INDEPENDENT_AMBULATORY_CARE_PROVIDER_SITE_OTHER): Payer: Medicare Other | Admitting: Internal Medicine

## 2015-03-05 ENCOUNTER — Ambulatory Visit (INDEPENDENT_AMBULATORY_CARE_PROVIDER_SITE_OTHER): Payer: Medicare Other | Admitting: Internal Medicine

## 2015-03-05 ENCOUNTER — Encounter (INDEPENDENT_AMBULATORY_CARE_PROVIDER_SITE_OTHER): Payer: Self-pay | Admitting: Internal Medicine

## 2015-03-05 VITALS — BP 112/72 | HR 74 | Temp 97.6°F | Resp 18 | Ht 66.0 in | Wt 152.7 lb

## 2015-03-05 DIAGNOSIS — R634 Abnormal weight loss: Secondary | ICD-10-CM | POA: Diagnosis not present

## 2015-03-05 DIAGNOSIS — R198 Other specified symptoms and signs involving the digestive system and abdomen: Secondary | ICD-10-CM

## 2015-03-05 DIAGNOSIS — R1912 Hyperactive bowel sounds: Secondary | ICD-10-CM

## 2015-03-05 NOTE — Patient Instructions (Signed)
Eat yogurt 1-2 cups daily. Weight check in 2 months. Call if symptoms change

## 2015-03-05 NOTE — Progress Notes (Signed)
Presenting complaint;  Noisy abdomen.  Subjective:  Patient is 81 year old Caucasian male who is here for scheduled visit. He was last seen on 05/08/2014 for dysphagia following an acute illness and he gradually got better. He now presents with complains of abdominal rumbling. He states his abdomen makes noises all the time. However this symptom is not associated with abdominal cramping urgency diarrhea constipation nausea or vomiting. He has lost 8 pounds since his last visit. He states weight loss has leveled off. He believes he has very good appetite. He eats 3 meals a day and snacks in between. He recalls that he took an antibiotic for bronchitis or sinusitis several months ago when running did not get better. He generally has 1 formed stool daily. He denies melena or rectal bleeding. Lately he's noted tremors to both extremities but he is able to drive and do other chores without any difficulty. He states his hoarseness has gradually improved. Has versus lung carcinoma is concerned he remains in remission.   Current Medications: Outpatient Encounter Prescriptions as of 03/05/2015  Medication Sig  . aspirin EC 81 MG tablet Take 81 mg by mouth daily.  Marland Kitchen atorvastatin (LIPITOR) 40 MG tablet TAKE 1 TABLET (40 MG TOTAL) BY MOUTH DAILY.  . fluticasone (FLONASE) 50 MCG/ACT nasal spray Place 1 spray into the nose daily.   Marland Kitchen KLOR-CON M10 10 MEQ tablet TAKE 1 TABLET BY MOUTH TWICE A DAY  . metoprolol (LOPRESSOR) 50 MG tablet TAKE 1 TABLET (50 MG TOTAL) BY MOUTH DAILY.  . Multiple Vitamin (MULTIVITAMIN WITH MINERALS) TABS Take 1 tablet by mouth daily.  . nitroGLYCERIN (NITROSTAT) 0.4 MG SL tablet Place 1 tablet (0.4 mg total) under the tongue every 5 (five) minutes as needed for chest pain.  . SYMBICORT 160-4.5 MCG/ACT inhaler INHALE 1 PUFF INTO THE LUNGS 2 (TWO) TIMES DAILY.  . tamsulosin (FLOMAX) 0.4 MG CAPS capsule One at bedtime  . torsemide (DEMADEX) 20 MG tablet TAKE 1 TABLET IN THE MORNING AND  TAKE 1 TABLET AT NOON (Patient taking differently: Take 20 mg by mouth daily. )  . VENTOLIN HFA 108 (90 BASE) MCG/ACT inhaler INHALE 2 PUFFS INTO THE LUNGS EVERY 6 HOURS AS NEEDED FOR SHORTNESS OF BREATH  . [DISCONTINUED] doxycycline (VIBRAMYCIN) 100 MG capsule Take 1 capsule (100 mg total) by mouth 2 (two) times daily. (Patient not taking: Reported on 03/05/2015)  . [DISCONTINUED] mupirocin ointment (BACTROBAN) 2 % Apply to affected area 1 times daily (Patient not taking: Reported on 03/05/2015)  . [DISCONTINUED] tetrahydrozoline-zinc (VISINE-AC) 0.05-0.25 % ophthalmic solution Place 1 drop into both eyes daily.    No facility-administered encounter medications on file as of 03/05/2015.     Objective: Blood pressure 112/72, pulse 74, temperature 97.6 F (36.4 C), temperature source Oral, resp. rate 18, height '5\' 6"'$  (1.676 m), weight 152 lb 11.2 oz (69.264 kg).  Patient is alert and in no acute distress. Conjunctiva is pink. Sclera is nonicteric Oropharyngeal mucosa is normal. No neck masses or thyromegaly noted. Cardiac exam with regular rhythm normal S1 and S2. No murmur or gallop noted. Lungs are clear to auscultation. Abdomen is symmetrical. Bowel sounds are normal. He has small umbilical hernia and weak area in the middle above the level of umbilicus. His abdomen is very soft without tenderness and hepatomegaly or masses. No LE edema or clubbing noted. He has tremors to both hands.   Assessment:  #1. Borborygmi. Patient has no alarm symptoms or symptoms to suggest malabsorption or bacterial overgrowth. This symptom  does not appear to be affecting quality of his life. Therefore we will simply monitor his course. #2. Weight loss. Etiology is not clear. Weight loss does not appear to be due to diminished oral intake. If he continues to lose weight may consider evaluation for malabsorption or small intestinal bacterial overgrowth.   Plan:  Patient advised to eat 1-2 cups of yogurt  daily. Weight check in 2 months. If he develops abdominal pain or diarrhea he will return for reevaluation.

## 2015-03-16 ENCOUNTER — Other Ambulatory Visit: Payer: Self-pay | Admitting: Family Medicine

## 2015-03-18 ENCOUNTER — Other Ambulatory Visit: Payer: Self-pay

## 2015-03-18 ENCOUNTER — Telehealth: Payer: Self-pay | Admitting: Family Medicine

## 2015-03-18 NOTE — Telephone Encounter (Signed)
Done

## 2015-03-18 NOTE — Telephone Encounter (Signed)
terazosin (HYTRIN) 5 MG capsule  Pt called to let us know that [facility] ordered this med on auto refill but they  Do not wish to keep it on the med list if we could remove it, since Dr Nicki Reaper  Has taken this off the pts meds for now. Thanks

## 2015-04-02 ENCOUNTER — Emergency Department (HOSPITAL_COMMUNITY): Payer: Medicare Other

## 2015-04-02 ENCOUNTER — Encounter (HOSPITAL_COMMUNITY): Payer: Self-pay | Admitting: Emergency Medicine

## 2015-04-02 ENCOUNTER — Inpatient Hospital Stay (HOSPITAL_COMMUNITY)
Admission: EM | Admit: 2015-04-02 | Discharge: 2015-04-04 | DRG: 312 | Disposition: A | Discharge status: Home / Self Care | Payer: Medicare Other | Attending: Family Medicine | Admitting: Family Medicine

## 2015-04-02 DIAGNOSIS — R55 Syncope and collapse: Principal | ICD-10-CM | POA: Diagnosis present

## 2015-04-02 DIAGNOSIS — Z87891 Personal history of nicotine dependence: Secondary | ICD-10-CM

## 2015-04-02 DIAGNOSIS — G250 Essential tremor: Secondary | ICD-10-CM | POA: Diagnosis present

## 2015-04-02 DIAGNOSIS — Z951 Presence of aortocoronary bypass graft: Secondary | ICD-10-CM

## 2015-04-02 DIAGNOSIS — R42 Dizziness and giddiness: Secondary | ICD-10-CM | POA: Insufficient documentation

## 2015-04-02 DIAGNOSIS — Z7982 Long term (current) use of aspirin: Secondary | ICD-10-CM

## 2015-04-02 DIAGNOSIS — N289 Disorder of kidney and ureter, unspecified: Secondary | ICD-10-CM | POA: Diagnosis not present

## 2015-04-02 DIAGNOSIS — I4510 Unspecified right bundle-branch block: Secondary | ICD-10-CM | POA: Diagnosis present

## 2015-04-02 DIAGNOSIS — E785 Hyperlipidemia, unspecified: Secondary | ICD-10-CM | POA: Diagnosis present

## 2015-04-02 DIAGNOSIS — R0602 Shortness of breath: Secondary | ICD-10-CM | POA: Diagnosis not present

## 2015-04-02 DIAGNOSIS — Q631 Lobulated, fused and horseshoe kidney: Secondary | ICD-10-CM

## 2015-04-02 DIAGNOSIS — R197 Diarrhea, unspecified: Secondary | ICD-10-CM | POA: Diagnosis not present

## 2015-04-02 DIAGNOSIS — I2510 Atherosclerotic heart disease of native coronary artery without angina pectoris: Secondary | ICD-10-CM | POA: Diagnosis present

## 2015-04-02 DIAGNOSIS — J441 Chronic obstructive pulmonary disease with (acute) exacerbation: Secondary | ICD-10-CM | POA: Diagnosis present

## 2015-04-02 DIAGNOSIS — N183 Chronic kidney disease, stage 3 unspecified: Secondary | ICD-10-CM | POA: Diagnosis present

## 2015-04-02 DIAGNOSIS — N184 Chronic kidney disease, stage 4 (severe): Secondary | ICD-10-CM | POA: Diagnosis not present

## 2015-04-02 DIAGNOSIS — I25810 Atherosclerosis of coronary artery bypass graft(s) without angina pectoris: Secondary | ICD-10-CM | POA: Insufficient documentation

## 2015-04-02 DIAGNOSIS — I5032 Chronic diastolic (congestive) heart failure: Secondary | ICD-10-CM | POA: Diagnosis not present

## 2015-04-02 DIAGNOSIS — I10 Essential (primary) hypertension: Secondary | ICD-10-CM

## 2015-04-02 DIAGNOSIS — Z881 Allergy status to other antibiotic agents status: Secondary | ICD-10-CM

## 2015-04-02 DIAGNOSIS — R062 Wheezing: Secondary | ICD-10-CM | POA: Diagnosis not present

## 2015-04-02 DIAGNOSIS — D638 Anemia in other chronic diseases classified elsewhere: Secondary | ICD-10-CM | POA: Diagnosis present

## 2015-04-02 DIAGNOSIS — R110 Nausea: Secondary | ICD-10-CM

## 2015-04-02 DIAGNOSIS — R0682 Tachypnea, not elsewhere classified: Secondary | ICD-10-CM | POA: Diagnosis not present

## 2015-04-02 DIAGNOSIS — I130 Hypertensive heart and chronic kidney disease with heart failure and stage 1 through stage 4 chronic kidney disease, or unspecified chronic kidney disease: Secondary | ICD-10-CM | POA: Diagnosis not present

## 2015-04-02 DIAGNOSIS — D649 Anemia, unspecified: Secondary | ICD-10-CM

## 2015-04-02 DIAGNOSIS — J449 Chronic obstructive pulmonary disease, unspecified: Secondary | ICD-10-CM | POA: Diagnosis present

## 2015-04-02 DIAGNOSIS — Z888 Allergy status to other drugs, medicaments and biological substances status: Secondary | ICD-10-CM

## 2015-04-02 DIAGNOSIS — Z882 Allergy status to sulfonamides status: Secondary | ICD-10-CM

## 2015-04-02 DIAGNOSIS — Z85118 Personal history of other malignant neoplasm of bronchus and lung: Secondary | ICD-10-CM

## 2015-04-02 DIAGNOSIS — Z880 Allergy status to penicillin: Secondary | ICD-10-CM

## 2015-04-02 LAB — URINALYSIS, ROUTINE W REFLEX MICROSCOPIC
Bilirubin Urine: NEGATIVE
GLUCOSE, UA: NEGATIVE mg/dL
Hgb urine dipstick: NEGATIVE
Ketones, ur: NEGATIVE mg/dL
Leukocytes, UA: NEGATIVE
NITRITE: NEGATIVE
PH: 5.5 (ref 5.0–8.0)
Protein, ur: NEGATIVE mg/dL
SPECIFIC GRAVITY, URINE: 1.01 (ref 1.005–1.030)

## 2015-04-02 LAB — COMPREHENSIVE METABOLIC PANEL
ALT: 17 U/L (ref 17–63)
ANION GAP: 7 (ref 5–15)
AST: 25 U/L (ref 15–41)
Albumin: 3.8 g/dL (ref 3.5–5.0)
Alkaline Phosphatase: 95 U/L (ref 38–126)
BUN: 49 mg/dL — AB (ref 6–20)
CHLORIDE: 106 mmol/L (ref 101–111)
CO2: 25 mmol/L (ref 22–32)
Calcium: 8.9 mg/dL (ref 8.9–10.3)
Creatinine, Ser: 1.9 mg/dL — ABNORMAL HIGH (ref 0.61–1.24)
GFR calc Af Amer: 34 mL/min — ABNORMAL LOW (ref >60)
GFR, EST NON AFRICAN AMERICAN: 29 mL/min — AB (ref >60)
Glucose, Bld: 126 mg/dL — ABNORMAL HIGH (ref 65–99)
POTASSIUM: 4.5 mmol/L (ref 3.5–5.1)
Sodium: 138 mmol/L (ref 135–145)
Total Bilirubin: 0.7 mg/dL (ref 0.3–1.2)
Total Protein: 6.6 g/dL (ref 6.5–8.1)

## 2015-04-02 LAB — TROPONIN I

## 2015-04-02 LAB — CBC WITH DIFFERENTIAL/PLATELET
BASOS ABS: 0 10*3/uL (ref 0.0–0.1)
Basophils Relative: 0 %
Eosinophils Absolute: 0.3 10*3/uL (ref 0.0–0.7)
Eosinophils Relative: 3 %
HEMATOCRIT: 32.7 % — AB (ref 39.0–52.0)
HEMOGLOBIN: 10.7 g/dL — AB (ref 13.0–17.0)
LYMPHS PCT: 31 %
Lymphs Abs: 3 10*3/uL (ref 0.7–4.0)
MCH: 31.9 pg (ref 26.0–34.0)
MCHC: 32.7 g/dL (ref 30.0–36.0)
MCV: 97.6 fL (ref 78.0–100.0)
MONO ABS: 1.2 10*3/uL — AB (ref 0.1–1.0)
Monocytes Relative: 13 %
NEUTROS ABS: 5 10*3/uL (ref 1.7–7.7)
NEUTROS PCT: 53 %
Platelets: 186 10*3/uL (ref 150–400)
RBC: 3.35 MIL/uL — AB (ref 4.22–5.81)
RDW: 14.4 % (ref 11.5–15.5)
WBC: 9.5 10*3/uL (ref 4.0–10.5)

## 2015-04-02 LAB — BRAIN NATRIURETIC PEPTIDE: B Natriuretic Peptide: 270 pg/mL — ABNORMAL HIGH (ref 0.0–100.0)

## 2015-04-02 LAB — POC OCCULT BLOOD, ED: Fecal Occult Bld: NEGATIVE

## 2015-04-02 MED ORDER — POTASSIUM CHLORIDE CRYS ER 10 MEQ PO TBCR
10.0000 meq | EXTENDED_RELEASE_TABLET | Freq: Two times a day (BID) | ORAL | Status: DC
  Administered 2015-04-03 – 2015-04-04 (×4): 10 meq via ORAL
  Filled 2015-04-02 (×4): qty 1

## 2015-04-02 MED ORDER — ONDANSETRON HCL 4 MG/2ML IJ SOLN
4.0000 mg | Freq: Once | INTRAMUSCULAR | Status: AC
  Administered 2015-04-02: 4 mg via INTRAVENOUS
  Filled 2015-04-02: qty 2

## 2015-04-02 MED ORDER — ONDANSETRON HCL 4 MG PO TABS: 4.0000 mg | ORAL_TABLET | Freq: Four times a day (QID) | ORAL | Status: DC | PRN

## 2015-04-02 MED ORDER — TAMSULOSIN HCL 0.4 MG PO CAPS
0.4000 mg | ORAL_CAPSULE | Freq: Every day | ORAL | Status: DC
  Administered 2015-04-03 – 2015-04-04 (×2): 0.4 mg via ORAL
  Filled 2015-04-02 (×2): qty 1

## 2015-04-02 MED ORDER — SODIUM CHLORIDE 0.9 % IV SOLN: 250.0000 mL | INTRAVENOUS | Status: DC | PRN

## 2015-04-02 MED ORDER — SODIUM CHLORIDE 0.9 % IJ SOLN: 3.0000 mL | INTRAMUSCULAR | Status: DC | PRN

## 2015-04-02 MED ORDER — METOPROLOL TARTRATE 25 MG PO TABS
25.0000 mg | ORAL_TABLET | Freq: Two times a day (BID) | ORAL | Status: DC
  Administered 2015-04-03 – 2015-04-04 (×4): 25 mg via ORAL
  Filled 2015-04-02 (×4): qty 1

## 2015-04-02 MED ORDER — IPRATROPIUM BROMIDE 0.02 % IN SOLN: 0.5000 mg | Freq: Four times a day (QID) | RESPIRATORY_TRACT | Status: DC

## 2015-04-02 MED ORDER — ONDANSETRON HCL 4 MG/2ML IJ SOLN: 4.0000 mg | Freq: Four times a day (QID) | INTRAMUSCULAR | Status: DC | PRN

## 2015-04-02 MED ORDER — SODIUM CHLORIDE 0.9 % IV BOLUS (SEPSIS)
500.0000 mL | Freq: Once | INTRAVENOUS | Status: AC
  Administered 2015-04-02: 500 mL via INTRAVENOUS

## 2015-04-02 MED ORDER — TORSEMIDE 20 MG PO TABS
20.0000 mg | ORAL_TABLET | Freq: Every day | ORAL | Status: DC
  Administered 2015-04-03 – 2015-04-04 (×2): 20 mg via ORAL
  Filled 2015-04-02 (×2): qty 1

## 2015-04-02 MED ORDER — ATORVASTATIN CALCIUM 40 MG PO TABS
40.0000 mg | ORAL_TABLET | Freq: Every day | ORAL | Status: DC
  Administered 2015-04-03: 40 mg via ORAL
  Filled 2015-04-02: qty 1

## 2015-04-02 MED ORDER — SODIUM CHLORIDE 0.9 % IJ SOLN
3.0000 mL | Freq: Two times a day (BID) | INTRAMUSCULAR | Status: DC
  Administered 2015-04-03 (×2): 3 mL via INTRAVENOUS

## 2015-04-02 MED ORDER — ENOXAPARIN SODIUM 30 MG/0.3ML ~~LOC~~ SOLN
30.0000 mg | SUBCUTANEOUS | Status: DC
  Administered 2015-04-03 (×2): 30 mg via SUBCUTANEOUS
  Filled 2015-04-02 (×2): qty 0.3

## 2015-04-02 MED ORDER — ALBUTEROL SULFATE (2.5 MG/3ML) 0.083% IN NEBU: 2.5000 mg | INHALATION_SOLUTION | Freq: Four times a day (QID) | RESPIRATORY_TRACT | Status: DC

## 2015-04-02 MED ORDER — IPRATROPIUM-ALBUTEROL 0.5-2.5 (3) MG/3ML IN SOLN
3.0000 mL | Freq: Once | RESPIRATORY_TRACT | Status: DC
  Filled 2015-04-02: qty 3

## 2015-04-02 MED ORDER — ASPIRIN EC 81 MG PO TBEC
81.0000 mg | DELAYED_RELEASE_TABLET | Freq: Every day | ORAL | Status: DC
  Administered 2015-04-03 – 2015-04-04 (×2): 81 mg via ORAL
  Filled 2015-04-02 (×2): qty 1

## 2015-04-02 MED ORDER — ALBUTEROL SULFATE (2.5 MG/3ML) 0.083% IN NEBU: 2.5000 mg | INHALATION_SOLUTION | RESPIRATORY_TRACT | Status: DC | PRN

## 2015-04-02 MED ORDER — FUROSEMIDE 10 MG/ML IJ SOLN
40.0000 mg | Freq: Once | INTRAMUSCULAR | Status: AC
  Administered 2015-04-02: 40 mg via INTRAVENOUS
  Filled 2015-04-02: qty 4

## 2015-04-02 MED ORDER — METHYLPREDNISOLONE SODIUM SUCC 125 MG IJ SOLR
60.0000 mg | Freq: Four times a day (QID) | INTRAMUSCULAR | Status: DC
  Administered 2015-04-03 (×3): 60 mg via INTRAVENOUS
  Filled 2015-04-02 (×3): qty 2

## 2015-04-02 NOTE — ED Provider Notes (Signed)
CSN: [id number]     Arrival date & time 04/02/15  2001 History   First MD Initiated Contact with Patient 04/02/15 2019     Chief Complaint  Patient presents with  . Shortness of Breath  . Abdominal Pain     (Consider location/radiation/quality/duration/timing/severity/associated sxs/prior Treatment) HPI  Patient was at home with his son today, complaining of shortness of breath when he suddenly be in 2 appear pale and weak. Problem persisted so unable also was called to his home. They felt that he was having trouble breathing, so they gave him a nebulizer which seemed to help some. He has had intermittent diarrhea recently. He uses a bronchodilator inhaler frequently home. He is fairly active and has been eating well recently. No recent fever, vomiting or complaint of chest pain. He is taking his usual medications, without relief. There are no other no modifying factors.  Past Medical History  Diagnosis Date  . ASCVD (arteriosclerotic cardiovascular disease)      CABG in 04/1993; and negative stress nuclear study in 08/2001  . Hyperlipidemia   . Syncope   . Hypertension   . Tobacco abuse, in remission     40 pack years; discontinued in 1980  . Cerebrovascular disease     Right carotid bruit-40-69% left internal carotid artery stenosis in 4/06; followed [facility]  . Chronic kidney disease, stage II (mild)     Creatinine-1.6 in 09/2008  . Degenerative joint disease of knee, left   . Normocytic anemia   . Pulmonary disease   . Cancer ([place])     skin  . Lung cancer ([place]) 2013  . Prediabetes   . CAD (coronary artery disease)   . COPD (chronic obstructive pulmonary disease) ([place])   . Benign essential tremor   . Horseshoe kidney   . Renal insufficiency    Past Surgical History  Procedure Laterality Date  . Coronary artery bypass graft  1995  . Colonoscopy w/ polypectomy  1985  . Pilonidal cyst excision  1948  . Lesion excision    . Cardiac surgery     Family History  Problem  Relation Age of Onset  . Cancer Brother   . Heart attack Brother    Social History  Substance Use Topics  . Smoking status: Former Smoker -- 1.00 packs/day for 40 years    Types: Cigarettes    Quit date: 08/14/1973  . Smokeless tobacco: Former Systems developer    Types: [place] date: 12/05/1980  . Alcohol Use: No    Review of Systems  All other systems reviewed and are negative.     Allergies  Beta adrenergic blockers; Levaquin; Penicillins; and Sulfonamide derivatives  Home Medications   Prior to Admission medications   Medication Sig Start Date End Date Taking? Authorizing Provider  aspirin EC 81 MG tablet Take 81 mg by mouth daily.    Historical Provider, MD  atorvastatin (LIPITOR) 40 MG tablet TAKE 1 TABLET (40 MG TOTAL) BY MOUTH DAILY. 12/24/14   Kathyrn Drown, MD  fluticasone (FLONASE) 50 MCG/ACT nasal spray Place 1 spray into the nose daily.     Historical Provider, MD  KLOR-CON M10 10 MEQ tablet TAKE 1 TABLET BY MOUTH TWICE A DAY 01/01/15   Kathyrn Drown, MD  metoprolol (LOPRESSOR) 50 MG tablet TAKE 1 TABLET (50 MG TOTAL) BY MOUTH DAILY. 02/19/15   Kathyrn Drown, MD  Multiple Vitamin (MULTIVITAMIN WITH MINERALS) TABS Take 1 tablet by mouth daily.    Historical  Provider, MD  nitroGLYCERIN (NITROSTAT) 0.4 MG SL tablet Place 1 tablet (0.4 mg total) under the tongue every 5 (five) minutes as needed for chest pain. 09/19/13   Kathyrn Drown, MD  SYMBICORT 160-4.5 MCG/ACT inhaler INHALE 1 PUFF INTO THE LUNGS 2 (TWO) TIMES DAILY. 08/23/14   Kathyrn Drown, MD  tamsulosin (FLOMAX) 0.4 MG CAPS capsule One at bedtime 11/26/14   Kathyrn Drown, MD  torsemide (DEMADEX) 20 MG tablet TAKE 1 TABLET IN THE MORNING AND TAKE 1 TABLET AT NOON Patient taking differently: Take 20 mg by mouth daily.  07/20/14   Kathyrn Drown, MD  VENTOLIN HFA 108 (90 BASE) MCG/ACT inhaler INHALE 2 PUFFS INTO THE LUNGS EVERY 6 HOURS AS NEEDED FOR SHORTNESS OF BREATH 10/08/14   Kathyrn Drown, MD   BP 178/65 mmHg  Pulse  88  Temp(Src) 97.5 F (36.4 C) (Oral)  Resp 24  Ht '5\' 7"'$  (1.702 m)  Wt 152 lb (68.947 kg)  BMI 23.80 kg/m2  SpO2 98% Physical Exam  Constitutional: He is oriented to person, place, and time. He appears well-developed. He appears distressed (he appears uncomfortable).  Elderly, frail  HENT:  Head: Normocephalic and atraumatic.  Right Ear: External ear normal.  Left Ear: External ear normal.  Eyes: Conjunctivae and EOM are normal. Pupils are equal, round, and reactive to light.  Neck: Normal range of motion and phonation normal. Neck supple.  Cardiovascular: Normal rate, regular rhythm and normal heart sounds.   Pulmonary/Chest: Effort normal and breath sounds normal. No respiratory distress. He has no wheezes. He exhibits no bony tenderness.  Abdominal: Soft. Bowel sounds are normal. He exhibits no mass. There is no tenderness. There is no guarding.  The abdomen is soft. No palpable aortic mass or tenderness of the abdomen.  Musculoskeletal: He exhibits no edema or tenderness.  Mild left knee tenderness and varus deformity.  Neurological: He is alert and oriented to person, place, and time. No cranial nerve deficit or sensory deficit. He exhibits normal muscle tone. Coordination normal.  Skin: Skin is warm, dry and intact.  Psychiatric: He has a normal mood and affect. His behavior is normal. Judgment and thought content normal.  Nursing note and vitals reviewed.   ED Course  Procedures (including critical care time) Medications  ipratropium-albuterol (DUONEB) 0.5-2.5 (3) MG/3ML nebulizer solution 3 mL (not administered)  ondansetron (ZOFRAN) injection 4 mg (4 mg Intravenous Given 04/02/15 2054)  sodium chloride 0.9 % bolus 500 mL (500 mLs Intravenous New Bag/Given 04/02/15 2054)    Patient Vitals for the past 24 hrs:  BP Temp Temp src Pulse Resp SpO2 Height Weight  04/02/15 2023 178/65 mmHg 97.5 F (36.4 C) Oral 88 24 98 % '5\' 7"'$  (1.702 m) 152 lb (68.947 kg)    9:38 PM  Reevaluation with update and discussion. After initial assessment and treatment, an updated evaluation reveals he feels somewhat better, is thirsty. Lungs with somewhat decreased air movement bilaterally and scattered end-expiratory wheezes. No respiratory distress. Done remained soft and nontender. Repeat vital signs show improvement of blood pressure to normal. Patient is alert, conversant, and states, "I've never felt this bad before". Haris Baack L   9:51 PM-Consult complete with Hospitalist. Patient case explained and discussed. He agrees to admit patient for further evaluation and treatment. Call ended at 10:55  Labs Review Labs Reviewed  COMPREHENSIVE METABOLIC PANEL - Abnormal; Notable for the following:    Glucose, Bld 126 (*)    BUN 49 (*)  Creatinine, Ser 1.90 (*)    GFR calc non Af Amer 29 (*)    GFR calc Af Amer 34 (*)    All other components within normal limits  CBC WITH DIFFERENTIAL/PLATELET - Abnormal; Notable for the following:    RBC 3.35 (*)    Hemoglobin 10.7 (*)    HCT 32.7 (*)    Monocytes Absolute 1.2 (*)    All other components within normal limits  URINE CULTURE  TROPONIN I  URINALYSIS, ROUTINE W REFLEX MICROSCOPIC (NOT AT [facility])  POC OCCULT BLOOD, ED    Imaging Review Dg Chest Port 1 View  04/02/2015  CLINICAL DATA:  Shortness of breath beginning 1 hour ago, nausea, dry heaving, hypertension, lung cancer, COPD, CABG, former smoker EXAM: PORTABLE CHEST 1 VIEW COMPARISON:  Portable exam 2048 hours compared to 11/18/2014 FINDINGS: Normal heart size post median sternotomy. Stable mediastinal contours. Chronic LEFT apical opacity corresponding to posttherapy changes from lung cancer, unchanged since 06/03/2013. Underlying emphysematous changes. Skin fold projects over RIGHT upper lobe. No pulmonary infiltrate, pleural effusion or pneumothorax. Bones demineralized. IMPRESSION: Posttherapy changes and scarring at the LEFT apex, stable. COPD changes without acute  infiltrate. Electronically Signed   By: Lavonia Dana M.D.   On: 04/02/2015 21:00   I have personally reviewed and evaluated these images and lab results as part of my medical decision-making.   EKG Interpretation   Date/Time:  Tuesday April 02 2015 20:45:21 EST Ventricular Rate:  83 PR Interval:  173 QRS Duration: 148 QT Interval:  422 QTC Calculation: 496 R Axis:   -107 Text Interpretation:  Sinus rhythm RBBB and LAFB since last tracing no  significant change Confirmed by Eulis Foster  MD, Vira Agar ([id number]) on 04/02/2015  8:50:53 PM      MDM   Final diagnoses:  Near syncope  Nausea  Diarrhea, unspecified type  Anemia, unspecified anemia type  Renal insufficiency    Evaluation consistent with near-syncope, cause not clear. No recurrence during ED observation. Baseline renal insufficiency is stable. BUN is elevated. Chronic anemia is present. Screening for gastrointestinal bleeding, is negative.  Nursing Notes Reviewed/ Care Coordinated, and agree without changes. Applicable Imaging Reviewed.  Interpretation of Laboratory Data incorporated into ED treatment   Plan: Admit  Daleen Bo, MD 04/03/15 2342

## 2015-04-02 NOTE — H&P (Signed)
PCP:   Sallee Lange, MD   Chief Complaint:  Shortness of breath  HPI: 81 y/o male who   has a past medical history of ASCVD (arteriosclerotic cardiovascular disease); Hyperlipidemia; Syncope; Hypertension; Tobacco abuse, in remission; Cerebrovascular disease; Chronic kidney disease, stage II (mild); Degenerative joint disease of knee, left; Normocytic anemia; Pulmonary disease; Cancer ([place]); Lung cancer ([place]) (2013); Prediabetes; CAD (coronary artery disease); COPD (chronic obstructive pulmonary disease) ([place]); Benign essential tremor; Horseshoe kidney; and Renal insufficiency. Today was brought to the ED from home after patient had an episode of near syncope, in which patient was unable to get up from sitting position and unresponsive for a few seconds. He was also diaphoretic. Patient has been complaining of shortness of breath on exertion. He has a history of COPD. He denies chest pain, no abdominal pain. Had 2 episodes of loose bowel movements the ED. Patient has been having intermittent diarrhea for quite some time now. In the ED patient was found to have bilateral wheezing, chest x-ray showed no significant abnormality. He denies fever, no nausea vomiting.  Allergies:   Allergies  Allergen Reactions  . Beta Adrenergic Blockers Hives and Swelling  . Levaquin [Levofloxacin In D5w] Nausea And Vomiting  . Penicillins Hives and Swelling  . Sulfonamide Derivatives Hives and Swelling      Past Medical History  Diagnosis Date  . ASCVD (arteriosclerotic cardiovascular disease)      CABG in 04/1993; and negative stress nuclear study in 08/2001  . Hyperlipidemia   . Syncope   . Hypertension   . Tobacco abuse, in remission     40 pack years; discontinued in 1980  . Cerebrovascular disease     Right carotid bruit-40-69% left internal carotid artery stenosis in 4/06; followed [facility]  . Chronic kidney disease, stage II (mild)     Creatinine-1.6 in 09/2008  . Degenerative joint disease of  knee, left   . Normocytic anemia   . Pulmonary disease   . Cancer ([place])     skin  . Lung cancer ([place]) 2013  . Prediabetes   . CAD (coronary artery disease)   . COPD (chronic obstructive pulmonary disease) ([place])   . Benign essential tremor   . Horseshoe kidney   . Renal insufficiency     Past Surgical History  Procedure Laterality Date  . Coronary artery bypass graft  1995  . Colonoscopy w/ polypectomy  1985  . Pilonidal cyst excision  1948  . Lesion excision    . Cardiac surgery      Prior to Admission medications   Medication Sig Start Date End Date Taking? Authorizing Provider  aspirin EC 81 MG tablet Take 81 mg by mouth daily.    Historical Provider, MD  atorvastatin (LIPITOR) 40 MG tablet TAKE 1 TABLET (40 MG TOTAL) BY MOUTH DAILY. 12/24/14   Kathyrn Drown, MD  fluticasone (FLONASE) 50 MCG/ACT nasal spray Place 1 spray into the nose daily.     Historical Provider, MD  KLOR-CON M10 10 MEQ tablet TAKE 1 TABLET BY MOUTH TWICE A DAY 01/01/15   Kathyrn Drown, MD  metoprolol (LOPRESSOR) 50 MG tablet TAKE 1 TABLET (50 MG TOTAL) BY MOUTH DAILY. 02/19/15   Kathyrn Drown, MD  Multiple Vitamin (MULTIVITAMIN WITH MINERALS) TABS Take 1 tablet by mouth daily.    Historical Provider, MD  nitroGLYCERIN (NITROSTAT) 0.4 MG SL tablet Place 1 tablet (0.4 mg total) under the tongue every 5 (five) minutes as needed for chest pain. 09/19/13  Kathyrn Drown, MD  SYMBICORT 160-4.5 MCG/ACT inhaler INHALE 1 PUFF INTO THE LUNGS 2 (TWO) TIMES DAILY. 08/23/14   Kathyrn Drown, MD  tamsulosin (FLOMAX) 0.4 MG CAPS capsule One at bedtime 11/26/14   Kathyrn Drown, MD  torsemide (DEMADEX) 20 MG tablet TAKE 1 TABLET IN THE MORNING AND TAKE 1 TABLET AT NOON Patient taking differently: Take 20 mg by mouth daily.  07/20/14   Kathyrn Drown, MD  VENTOLIN HFA 108 (90 BASE) MCG/ACT inhaler INHALE 2 PUFFS INTO THE LUNGS EVERY 6 HOURS AS NEEDED FOR SHORTNESS OF BREATH 10/08/14   Kathyrn Drown, MD    Social History:   reports that he quit smoking about 41 years ago. His smoking use included Cigarettes. He has a 40 pack-year smoking history. He quit smokeless tobacco use about 34 years ago. His smokeless tobacco use included Chew. He reports that he does not drink alcohol or use illicit drugs.  Family History  Problem Relation Age of Onset  . Cancer Brother   . Heart attack Brother     [facility] Weights   04/02/15 2023  Weight: 68.947 kg (152 lb)    All the positives are listed in BOLD  Review of Systems:  HEENT: Headache, blurred vision, runny nose, sore throat Neck: Hypothyroidism, hyperthyroidism,,lymphadenopathy Chest : Shortness of breath, history of COPD, Asthma Heart : Chest pain, history of coronary arterey disease GI:  Nausea, vomiting, diarrhea, constipation, GERD GU: Dysuria, urgency, frequency of urination, hematuria Neuro: Stroke, seizures, syncope Psych: Depression, anxiety, hallucinations   Physical Exam: Blood pressure 178/65, pulse 88, temperature 97.5 F (36.4 C), temperature source Oral, resp. rate 24, height '5\' 7"'$  (1.702 m), weight 68.947 kg (152 lb), SpO2 98 %. Constitutional:   Patient is a well-developed and well-nourished male* in no acute distress and cooperative with exam. Head: Normocephalic and atraumatic Mouth: Mucus membranes moist Eyes: PERRL, EOMI, conjunctivae normal Neck: Supple, No Thyromegaly Cardiovascular: RRR, S1 normal, S2 normal Pulmonary/Chest: Bilateral wheezing Abdominal: Soft. Non-tender, non-distended, bowel sounds are normal, no masses, organomegaly, or guarding present.  Neurological: A&O x3, Strength is normal and symmetric bilaterally, cranial nerve II-XII are grossly intact, no focal motor deficit, sensory intact to light touch bilaterally.  Extremities : No Cyanosis, Clubbing , 1+ ankle edema bilaterally  Labs on Admission:  Basic Metabolic Panel:  Recent Labs Lab 04/02/15 2100  NA 138  K 4.5  CL 106  CO2 25  GLUCOSE 126*  BUN 49*    CREATININE 1.90*  CALCIUM 8.9   Liver Function Tests:  Recent Labs Lab 04/02/15 2100  AST 25  ALT 17  ALKPHOS 95  BILITOT 0.7  PROT 6.6  ALBUMIN 3.8   CBC:  Recent Labs Lab 04/02/15 2100  WBC 9.5  NEUTROABS 5.0  HGB 10.7*  HCT 32.7*  MCV 97.6  PLT 186   Cardiac Enzymes:  Recent Labs Lab 04/02/15 2100  TROPONINI <0.03     Radiological Exams on Admission: Dg Chest Port 1 View  04/02/2015  CLINICAL DATA:  Shortness of breath beginning 1 hour ago, nausea, dry heaving, hypertension, lung cancer, COPD, CABG, former smoker EXAM: PORTABLE CHEST 1 VIEW COMPARISON:  Portable exam 2048 hours compared to 11/18/2014 FINDINGS: Normal heart size post median sternotomy. Stable mediastinal contours. Chronic LEFT apical opacity corresponding to posttherapy changes from lung cancer, unchanged since 06/03/2013. Underlying emphysematous changes. Skin fold projects over RIGHT upper lobe. No pulmonary infiltrate, pleural effusion or pneumothorax. Bones demineralized. IMPRESSION: Posttherapy changes and scarring at  the LEFT apex, stable. COPD changes without acute infiltrate. Electronically Signed   By: Lavonia Dana M.D.   On: 04/02/2015 21:00    EKG: Independently reviewed. Sinus rhythm, RBBB   Assessment/Plan Active Problems:   Essential hypertension   CKD (chronic kidney disease), stage III   COPD exacerbation (HCC)   Near syncope   Diarrhea  Near-syncope Patient had episode of near-syncope, was found to be hypertensive in the ED with blood pressure 202/70. Will monitor the patient on telemetry. Consult cardiology in a.m. Obtain echocardiogram, cycle the Cardiac enzymes. First troponin is negative in the ED  COPD exacerbation Patient has a history of COPD and is wheezing today. Start Solu-Medrol 60 mg IV every 6 hours. DuoNeb nebulizers every 6 hours, albuterol every 2 hours when necessary. Can switch to to prednisone in a.m. if improved.  History of CAD status post  CABG Stable, continue aspirin, atorvastatin, metoprolol  ? History of diastolic CHF Review of records from 2012 showed elevated BNP 1336 on 12/06/2010 Patient is on Demadex 20 mg daily Will give 1 dose of Lasix 40 mg IV 1, check BNP Check echocardiogram in a.m.  Diarrhea Patient has ongoing intermittent diarrhea, will obtain stool for C. Difficile  DVT prophylaxis Lovenox  Code status: Full code  Family discussion: Admission, patients condition and plan of care including tests being ordered have been discussed with the patient and his son at bedside who indicate understanding and agree with the plan and Code Status.   Time Spent on Admission: 60 min  [facility] Pager: [phone] 04/02/2015, 10:27 PM  If 7PM-7AM, please contact night-coverage  [email or web address]  Password [email or web address]

## 2015-04-02 NOTE — ED Notes (Signed)
Patient given water as requested for fluid challenge.

## 2015-04-02 NOTE — ED Notes (Signed)
Patient from home complaining of shortness of breath that started approximately an hour ago. Bilateral wheezing, 5 mg of albuterol given via EMS. Patient complaining of abdominal pain on arrival to ED as well.

## 2015-04-03 ENCOUNTER — Observation Stay (HOSPITAL_COMMUNITY): Payer: Medicare Other

## 2015-04-03 DIAGNOSIS — D638 Anemia in other chronic diseases classified elsewhere: Secondary | ICD-10-CM | POA: Diagnosis present

## 2015-04-03 DIAGNOSIS — I2510 Atherosclerotic heart disease of native coronary artery without angina pectoris: Secondary | ICD-10-CM | POA: Diagnosis present

## 2015-04-03 DIAGNOSIS — Z951 Presence of aortocoronary bypass graft: Secondary | ICD-10-CM | POA: Diagnosis not present

## 2015-04-03 DIAGNOSIS — R42 Dizziness and giddiness: Secondary | ICD-10-CM

## 2015-04-03 DIAGNOSIS — Z85118 Personal history of other malignant neoplasm of bronchus and lung: Secondary | ICD-10-CM | POA: Diagnosis not present

## 2015-04-03 DIAGNOSIS — G250 Essential tremor: Secondary | ICD-10-CM | POA: Diagnosis present

## 2015-04-03 DIAGNOSIS — Q631 Lobulated, fused and horseshoe kidney: Secondary | ICD-10-CM | POA: Diagnosis not present

## 2015-04-03 DIAGNOSIS — N183 Chronic kidney disease, stage 3 (moderate): Secondary | ICD-10-CM | POA: Diagnosis not present

## 2015-04-03 DIAGNOSIS — Z87891 Personal history of nicotine dependence: Secondary | ICD-10-CM | POA: Diagnosis not present

## 2015-04-03 DIAGNOSIS — Z880 Allergy status to penicillin: Secondary | ICD-10-CM | POA: Diagnosis not present

## 2015-04-03 DIAGNOSIS — Z881 Allergy status to other antibiotic agents status: Secondary | ICD-10-CM | POA: Diagnosis not present

## 2015-04-03 DIAGNOSIS — J441 Chronic obstructive pulmonary disease with (acute) exacerbation: Secondary | ICD-10-CM

## 2015-04-03 DIAGNOSIS — Z888 Allergy status to other drugs, medicaments and biological substances status: Secondary | ICD-10-CM | POA: Diagnosis not present

## 2015-04-03 DIAGNOSIS — R55 Syncope and collapse: Secondary | ICD-10-CM

## 2015-04-03 DIAGNOSIS — R197 Diarrhea, unspecified: Secondary | ICD-10-CM | POA: Diagnosis present

## 2015-04-03 DIAGNOSIS — R110 Nausea: Secondary | ICD-10-CM | POA: Diagnosis not present

## 2015-04-03 DIAGNOSIS — I5032 Chronic diastolic (congestive) heart failure: Secondary | ICD-10-CM | POA: Diagnosis present

## 2015-04-03 DIAGNOSIS — N184 Chronic kidney disease, stage 4 (severe): Secondary | ICD-10-CM | POA: Diagnosis present

## 2015-04-03 DIAGNOSIS — Z882 Allergy status to sulfonamides status: Secondary | ICD-10-CM | POA: Diagnosis not present

## 2015-04-03 DIAGNOSIS — I4510 Unspecified right bundle-branch block: Secondary | ICD-10-CM | POA: Diagnosis present

## 2015-04-03 DIAGNOSIS — I25810 Atherosclerosis of coronary artery bypass graft(s) without angina pectoris: Secondary | ICD-10-CM | POA: Diagnosis not present

## 2015-04-03 DIAGNOSIS — Z7982 Long term (current) use of aspirin: Secondary | ICD-10-CM | POA: Diagnosis not present

## 2015-04-03 DIAGNOSIS — I130 Hypertensive heart and chronic kidney disease with heart failure and stage 1 through stage 4 chronic kidney disease, or unspecified chronic kidney disease: Secondary | ICD-10-CM | POA: Diagnosis present

## 2015-04-03 DIAGNOSIS — E785 Hyperlipidemia, unspecified: Secondary | ICD-10-CM | POA: Diagnosis present

## 2015-04-03 LAB — COMPREHENSIVE METABOLIC PANEL
ALK PHOS: 87 U/L (ref 38–126)
ALT: 16 U/L — ABNORMAL LOW (ref 17–63)
ANION GAP: 9 (ref 5–15)
AST: 23 U/L (ref 15–41)
Albumin: 3.6 g/dL (ref 3.5–5.0)
BILIRUBIN TOTAL: 0.8 mg/dL (ref 0.3–1.2)
BUN: 50 mg/dL — ABNORMAL HIGH (ref 6–20)
CALCIUM: 9 mg/dL (ref 8.9–10.3)
CO2: 26 mmol/L (ref 22–32)
Chloride: 105 mmol/L (ref 101–111)
Creatinine, Ser: 2.07 mg/dL — ABNORMAL HIGH (ref 0.61–1.24)
GFR calc non Af Amer: 26 mL/min — ABNORMAL LOW (ref >60)
GFR, EST AFRICAN AMERICAN: 31 mL/min — AB (ref >60)
Glucose, Bld: 136 mg/dL — ABNORMAL HIGH (ref 65–99)
Potassium: 4.5 mmol/L (ref 3.5–5.1)
Sodium: 140 mmol/L (ref 135–145)
TOTAL PROTEIN: 6.3 g/dL — AB (ref 6.5–8.1)

## 2015-04-03 LAB — CBC
HEMATOCRIT: 31.4 % — AB (ref 39.0–52.0)
HEMOGLOBIN: 10.3 g/dL — AB (ref 13.0–17.0)
MCH: 32.3 pg (ref 26.0–34.0)
MCHC: 32.8 g/dL (ref 30.0–36.0)
MCV: 98.4 fL (ref 78.0–100.0)
Platelets: 191 10*3/uL (ref 150–400)
RBC: 3.19 MIL/uL — ABNORMAL LOW (ref 4.22–5.81)
RDW: 14.3 % (ref 11.5–15.5)
WBC: 6.8 10*3/uL (ref 4.0–10.5)

## 2015-04-03 LAB — TROPONIN I
Troponin I: <0.03 ng/mL (ref <0.031)
Troponin I: 0.03 ng/mL (ref <0.031)
Troponin I: 0.03 ng/mL (ref <0.031)

## 2015-04-03 LAB — GLUCOSE, CAPILLARY
GLUCOSE-CAPILLARY: 198 mg/dL — AB (ref 65–99)
Glucose-Capillary: 143 mg/dL — ABNORMAL HIGH (ref 65–99)
Glucose-Capillary: 216 mg/dL — ABNORMAL HIGH (ref 65–99)

## 2015-04-03 MED ORDER — IPRATROPIUM-ALBUTEROL 0.5-2.5 (3) MG/3ML IN SOLN
3.0000 mL | Freq: Four times a day (QID) | RESPIRATORY_TRACT | Status: DC
  Administered 2015-04-03 (×2): 3 mL via RESPIRATORY_TRACT
  Filled 2015-04-03 (×2): qty 3

## 2015-04-03 MED ORDER — PREDNISONE 20 MG PO TABS
40.0000 mg | ORAL_TABLET | Freq: Every day | ORAL | Status: DC
Start: 2015-04-04 — End: 2015-04-04
  Administered 2015-04-04: 40 mg via ORAL
  Filled 2015-04-03: qty 2

## 2015-04-03 MED ORDER — IPRATROPIUM-ALBUTEROL 0.5-2.5 (3) MG/3ML IN SOLN
3.0000 mL | Freq: Two times a day (BID) | RESPIRATORY_TRACT | Status: DC
  Administered 2015-04-03 – 2015-04-04 (×2): 3 mL via RESPIRATORY_TRACT
  Filled 2015-04-03 (×2): qty 3

## 2015-04-03 NOTE — Care Management Note (Signed)
Case Management Note  Patient Details  Name: [name] MRN: [id number] Date of Birth: Jan 26, 1922  Subjective/Objective:                  Pt admitted from home with syncope and COPD. Pt lives with his wife and son and will return home at discharge. Pt is independent with ADL's.  Action/Plan: No CM needs noted.   Expected Discharge Date:                  Expected Discharge Plan:  Home/Self Care  In-House Referral:  NA  Discharge planning Services  CM Consult  Post Acute Care Choice:  NA Choice offered to:  NA  DME Arranged:    DME Agency:     HH Arranged:    HH Agency:     Status of Service:  Completed, signed off  Medicare Important Message Given:    Date Medicare IM Given:    Medicare IM give by:    Date Additional Medicare IM Given:    Additional Medicare Important Message give by:     If discussed at [place] of Stay Meetings, dates discussed:    Additional Comments:  Joylene Draft, RN 04/03/2015, 2:08 PM

## 2015-04-03 NOTE — Progress Notes (Signed)
PROGRESS NOTE  [name]:[id number] DOB: July 14, 1921 DOA: 04/02/2015 PCP: Sallee Lange, MD  Summary: 81 y/o male with a hx of CAD, HLD, HTN, CKD stage III, normocytic anemia, and COPD presented s/p an episode of near syncope. While in the ED, he was noted to have bilaterally wheezing. He was admitted for further management.   Assessment/Plan: 1. Near-syncope, no further episodes. Serial troponins negative, ECHO ok per cardiology. EKG SR, RBBB, NSCSLT 11/18/2014. 2. COPD exacerbation ,resolved with steroids and bronchodilators. CXR unremarkable. 3. PMH CAD s/p CABG, stable. Continue ASA, atorvastatin, and metoprolol.  4. HLD, continue statin.  5. ?hx of diastolic CHF. Labs from 11/2010 reveal elevated BNP of 1336. BNP yesterday of 270. ECHO pending, continue Demadex '20mg'$ . 6. Diarrhea,  C.diff pending. Long standing, followed by Dr. Laural Golden 7. CKD stage III-IV, horseshoe kidney, creatinine 2.07 today. Appears to be at baseline.  8. Anemia of chronic disease, stable. Appears to be at baseline.    Overall improved. Discussed with Dr. Harrington Challenger. Echo reassuring. Recommends discharge home 12/22.  Discussed with son at bedside  F/u with Dr. Laural Golden as an outpatient.  Code Status: Full DVT prophylaxis: Lovenox Family Communication:  Disposition Plan: home  Murray Hodgkins, MD  [facility]  Pager [phone] If 7PM-7AM, please contact night-coverage at [email or web address], password [facility] 04/03/2015, 7:02 AM    Consultants:    Procedures:    Antibiotics:    HPI/Subjective: Feels much better, no pain, breathing better. Ambulating better. No focal symptoms.  Objective: Filed Vitals:   04/02/15 2311 04/03/15 0241 04/03/15 0251 04/03/15 0516  BP: 139/53   117/68  Pulse: 97   87  Temp: 97.9 F (36.6 C)   98.4 F (36.9 C)  TempSrc: Oral   Oral  Resp: 20   17  Height:      Weight: 69.2 kg (152 lb 8.9 oz)     SpO2: 100% 93% 93% 95%    Intake/Output Summary (Last 24 hours) at  04/03/15 8338 Last data filed at 04/03/15 0529  Gross per 24 hour  Intake      0 ml  Output   1000 ml  Net  -1000 ml     Filed Weights   04/02/15 2023 04/02/15 2311  Weight: 68.947 kg (152 lb) 69.2 kg (152 lb 8.9 oz)    Exam:  VSS, afebrile General:  Appears calm and comfortable Eyes: PERRL, normal lids, irises  ENT: grossly normal hearing, lips & tongue Cardiovascular: RRR, no r/g. 2/6 systolic murmur. No LE edema. Telemetry: SR, no arrhythmias  Respiratory: CTA bilaterally, no w/r/r. Normal respiratory effort. Musculoskeletal: grossly normal tone BUE/BLE. Excellent bilateral LE strength. Psychiatric: grossly normal mood and affect, speech fluent and appropriate Neurologic: grossly non-focal. CN appear intact.   New data reviewed:  Creatinine 2.07, BUN 50  Hgb 10.3  Pertinent data since admission:  Creatinine 1.90, BUN 49  BNP 270  Troponin negative  Hgb 10.7  Scheduled Meds: . aspirin EC  81 mg Oral Daily  . atorvastatin  40 mg Oral q1800  . enoxaparin (LOVENOX) injection  30 mg Subcutaneous Q24H  . ipratropium-albuterol  3 mL Nebulization Q6H  . methylPREDNISolone (SOLU-MEDROL) injection  60 mg Intravenous Q6H  . metoprolol  25 mg Oral BID  . potassium chloride  10 mEq Oral BID  . sodium chloride  3 mL Intravenous Q12H  . tamsulosin  0.4 mg Oral Daily  . torsemide  20 mg Oral Daily   Continuous Infusions:   Active Problems:  Essential hypertension   CKD (chronic kidney disease), stage III   COPD exacerbation ([place])   Near syncope   Diarrhea       By signing my name below, I, Rosalie Doctor attest that this documentation has been prepared under the direction and in the presence of Murray Hodgkins, MD Electronically signed: Rosalie Doctor, Scribe.  04/03/2015  I personally performed the services described in this documentation. All medical record entries made by the scribe were at my direction. I have reviewed the chart and agree that the  record reflects my personal performance and is accurate and complete. Murray Hodgkins, MD

## 2015-04-03 NOTE — Consult Note (Signed)
Primary Physician: Primary Cardiologist:  Last seen by Estill Dooms in 2012   POE:UMPNTIR is a 81 ye with history of CP and CAD  Admitted for presyncopal spell  Last seen several years ago by R Rothbart  Has a history of CAD (remote CABG 90s ),  HTN, syncope, HL, CV dz, CKD,Lung CA, COPD,  Pt has had intermittent diarrhea for awhile   Yesterday was at home  Family said he became very weak  Did not pass out, nearly did.   Pt does not SOB with exertion  No CP Claims PO intake stable  In ER was wheezing  Rx with IV steroids for COPD flare       Past Medical History  Diagnosis Date  . ASCVD (arteriosclerotic cardiovascular disease)      CABG in 04/1993; and negative stress nuclear study in 08/2001  . Hyperlipidemia   . Syncope   . Hypertension   . Tobacco abuse, in remission     40 pack years; discontinued in 1980  . Cerebrovascular disease     Right carotid bruit-40-69% left internal carotid artery stenosis in 4/06; followed [facility]  . Chronic kidney disease, stage II (mild)     Creatinine-1.6 in 09/2008  . Degenerative joint disease of knee, left   . Normocytic anemia   . Pulmonary disease   . Cancer ([place])     skin  . Lung cancer ([place]) 2013  . Prediabetes   . CAD (coronary artery disease)   . COPD (chronic obstructive pulmonary disease) ([place])   . Benign essential tremor   . Horseshoe kidney   . Renal insufficiency     Medications Prior to Admission  Medication Sig Dispense Refill  . aspirin EC 81 MG tablet Take 81 mg by mouth daily.    Marland Kitchen atorvastatin (LIPITOR) 40 MG tablet TAKE 1 TABLET (40 MG TOTAL) BY MOUTH DAILY. 90 tablet 0  . fluticasone (FLONASE) 50 MCG/ACT nasal spray Place 1 spray into the nose daily.     Marland Kitchen KLOR-CON M10 10 MEQ tablet TAKE 1 TABLET BY MOUTH TWICE A DAY 180 tablet 0  . metoprolol (LOPRESSOR) 50 MG tablet TAKE 1 TABLET (50 MG TOTAL) BY MOUTH DAILY. 90 tablet 1  . Multiple Vitamin (MULTIVITAMIN WITH MINERALS) TABS Take 1 tablet by mouth daily.    .  nitroGLYCERIN (NITROSTAT) 0.4 MG SL tablet Place 1 tablet (0.4 mg total) under the tongue every 5 (five) minutes as needed for chest pain. 25 tablet 11  . SYMBICORT 160-4.5 MCG/ACT inhaler INHALE 1 PUFF INTO THE LUNGS 2 (TWO) TIMES DAILY. 20.4 Inhaler 5  . tamsulosin (FLOMAX) 0.4 MG CAPS capsule One at bedtime (Patient taking differently: Take 0.4 mg by mouth at bedtime. ) 30 capsule 5  . torsemide (DEMADEX) 20 MG tablet TAKE 1 TABLET IN THE MORNING AND TAKE 1 TABLET AT NOON (Patient taking differently: Take 20 mg by mouth every morning. ) 180 tablet 1  . VENTOLIN HFA 108 (90 BASE) MCG/ACT inhaler INHALE 2 PUFFS INTO THE LUNGS EVERY 6 HOURS AS NEEDED FOR SHORTNESS OF BREATH 54 Inhaler 1     . aspirin EC  81 mg Oral Daily  . atorvastatin  40 mg Oral q1800  . enoxaparin (LOVENOX) injection  30 mg Subcutaneous Q24H  . ipratropium-albuterol  3 mL Nebulization BID  . methylPREDNISolone (SOLU-MEDROL) injection  60 mg Intravenous Q6H  . metoprolol  25 mg Oral BID  . potassium chloride  10 mEq Oral BID  .  sodium chloride  3 mL Intravenous Q12H  . tamsulosin  0.4 mg Oral Daily  . torsemide  20 mg Oral Daily    Infusions:    Allergies  Allergen Reactions  . Beta Adrenergic Blockers Hives and Swelling  . Levaquin [Levofloxacin In D5w] Nausea And Vomiting  . Penicillins Hives and Swelling    Has patient had a PCN reaction causing immediate rash, facial/tongue/throat swelling, SOB or lightheadedness with hypotension: Yes Has patient had a PCN reaction causing severe rash involving mucus membranes or skin necrosis: No Has patient had a PCN reaction that required hospitalization No Has patient had a PCN reaction occurring within the last 10 years: No If all of the above answers are "NO", then may proceed with Cephalosporin use.   . Sulfonamide Derivatives Hives and Swelling    Social History   Social History  . Marital Status: Married    Spouse Name: N/A  . Number of Children: 3  . Years  of Education: N/A   Occupational History  . retired     Clinical biochemist with [employer]   Social History Main Topics  . Smoking status: Former Smoker -- 1.00 packs/day for 40 years    Types: Cigarettes    Quit date: 08/14/1973  . Smokeless tobacco: Former Systems developer    Types: [place] date: 12/05/1980  . Alcohol Use: No  . Drug Use: No  . Sexual Activity: Not Currently   Other Topics Concern  . Not on file   Social History Narrative    Family History  Problem Relation Age of Onset  . Cancer Brother   . Heart attack Brother     REVIEW OF SYSTEMS:  All systems reviewed  Negative to the above problem except as noted above.    PHYSICAL EXAM: Filed Vitals:   04/03/15 1008 04/03/15 1011  BP: 146/56 141/58  Pulse: 88 88  Temp:    Resp:       Intake/Output Summary (Last 24 hours) at 04/03/15 1536 Last data filed at 04/03/15 1051  Gross per 24 hour  Intake    120 ml  Output   1000 ml  Net   -880 ml    General:  Well appearing. No respiratory difficulty HEENT: normal Neck: supple. no JVD. Carotids 2+ bilat; no bruits. No lymphadenopathy or thryomegaly appreciated. Cor: PMI nondisplaced. Regular rate & rhythm. No rubs, gallops or murmurs. Lungs: clear Abdomen: soft, nontender, nondistended. No hepatosplenomegaly. No bruits or masses. Good bowel sounds. Extremities: no cyanosis, clubbing, rash, edema Neuro: alert & oriented x 3, cranial nerves grossly intact. moves all 4 extremities w/o difficulty. Affect pleasant.  ECG:  SR 83 bpm  RBBB  LAFB    Results for orders placed or performed during the hospital encounter of 04/02/15 (from the past 24 hour(s))  Urinalysis, Routine w reflex microscopic     Status: None   Collection Time: 04/02/15  8:51 PM  Result Value Ref Range   Color, Urine YELLOW YELLOW   APPearance CLEAR CLEAR   Specific Gravity, Urine 1.010 1.005 - 1.030   pH 5.5 5.0 - 8.0   Glucose, UA NEGATIVE NEGATIVE mg/dL   Hgb urine dipstick NEGATIVE NEGATIVE    Bilirubin Urine NEGATIVE NEGATIVE   Ketones, ur NEGATIVE NEGATIVE mg/dL   Protein, ur NEGATIVE NEGATIVE mg/dL   Nitrite NEGATIVE NEGATIVE   Leukocytes, UA NEGATIVE NEGATIVE  Comprehensive metabolic panel     Status: Abnormal   Collection Time: 04/02/15  9:00 PM  Result Value Ref Range   Sodium 138 135 - 145 mmol/L   Potassium 4.5 3.5 - 5.1 mmol/L   Chloride 106 101 - 111 mmol/L   CO2 25 22 - 32 mmol/L   Glucose, Bld 126 (H) 65 - 99 mg/dL   BUN 49 (H) 6 - 20 mg/dL   Creatinine, Ser 1.90 (H) 0.61 - 1.24 mg/dL   Calcium 8.9 8.9 - 10.3 mg/dL   Total Protein 6.6 6.5 - 8.1 g/dL   Albumin 3.8 3.5 - 5.0 g/dL   AST 25 15 - 41 U/L   ALT 17 17 - 63 U/L   Alkaline Phosphatase 95 38 - 126 U/L   Total Bilirubin 0.7 0.3 - 1.2 mg/dL   GFR calc non Af Amer 29 (L) >60 mL/min   GFR calc Af Amer 34 (L) >60 mL/min   Anion gap 7 5 - 15  Troponin I     Status: None   Collection Time: 04/02/15  9:00 PM  Result Value Ref Range   Troponin I <0.03 <0.031 ng/mL  CBC with Differential     Status: Abnormal   Collection Time: 04/02/15  9:00 PM  Result Value Ref Range   WBC 9.5 4.0 - 10.5 K/uL   RBC 3.35 (L) 4.22 - 5.81 MIL/uL   Hemoglobin 10.7 (L) 13.0 - 17.0 g/dL   HCT 32.7 (L) 39.0 - 52.0 %   MCV 97.6 78.0 - 100.0 fL   MCH 31.9 26.0 - 34.0 pg   MCHC 32.7 30.0 - 36.0 g/dL   RDW 14.4 11.5 - 15.5 %   Platelets 186 150 - 400 K/uL   Neutrophils Relative % 53 %   Neutro Abs 5.0 1.7 - 7.7 K/uL   Lymphocytes Relative 31 %   Lymphs Abs 3.0 0.7 - 4.0 K/uL   Monocytes Relative 13 %   Monocytes Absolute 1.2 (H) 0.1 - 1.0 K/uL   Eosinophils Relative 3 %   Eosinophils Absolute 0.3 0.0 - 0.7 K/uL   Basophils Relative 0 %   Basophils Absolute 0.0 0.0 - 0.1 K/uL  Brain natriuretic peptide     Status: Abnormal   Collection Time: 04/02/15  9:00 PM  Result Value Ref Range   B Natriuretic Peptide 270.0 (H) 0.0 - 100.0 pg/mL  POC occult blood, ED     Status: None   Collection Time: 04/02/15  9:47 PM  Result  Value Ref Range   Fecal Occult Bld NEGATIVE NEGATIVE  Troponin I     Status: None   Collection Time: 04/02/15 11:15 PM  Result Value Ref Range   Troponin I <0.03 <0.031 ng/mL  Troponin I     Status: None   Collection Time: 04/03/15  4:54 AM  Result Value Ref Range   Troponin I 0.03 <0.031 ng/mL  CBC     Status: Abnormal   Collection Time: 04/03/15  4:54 AM  Result Value Ref Range   WBC 6.8 4.0 - 10.5 K/uL   RBC 3.19 (L) 4.22 - 5.81 MIL/uL   Hemoglobin 10.3 (L) 13.0 - 17.0 g/dL   HCT 31.4 (L) 39.0 - 52.0 %   MCV 98.4 78.0 - 100.0 fL   MCH 32.3 26.0 - 34.0 pg   MCHC 32.8 30.0 - 36.0 g/dL   RDW 14.3 11.5 - 15.5 %   Platelets 191 150 - 400 K/uL  Comprehensive metabolic panel     Status: Abnormal   Collection Time: 04/03/15  4:54 AM  Result Value Ref Range  Sodium 140 135 - 145 mmol/L   Potassium 4.5 3.5 - 5.1 mmol/L   Chloride 105 101 - 111 mmol/L   CO2 26 22 - 32 mmol/L   Glucose, Bld 136 (H) 65 - 99 mg/dL   BUN 50 (H) 6 - 20 mg/dL   Creatinine, Ser 2.07 (H) 0.61 - 1.24 mg/dL   Calcium 9.0 8.9 - 10.3 mg/dL   Total Protein 6.3 (L) 6.5 - 8.1 g/dL   Albumin 3.6 3.5 - 5.0 g/dL   AST 23 15 - 41 U/L   ALT 16 (L) 17 - 63 U/L   Alkaline Phosphatase 87 38 - 126 U/L   Total Bilirubin 0.8 0.3 - 1.2 mg/dL   GFR calc non Af Amer 26 (L) >60 mL/min   GFR calc Af Amer 31 (L) >60 mL/min   Anion gap 9 5 - 15  Glucose, capillary     Status: Abnormal   Collection Time: 04/03/15  8:02 AM  Result Value Ref Range   Glucose-Capillary 143 (H) 65 - 99 mg/dL   Comment 1 Notify RN   Troponin I     Status: None   Collection Time: 04/03/15 11:26 AM  Result Value Ref Range   Troponin I 0.03 <0.031 ng/mL  Glucose, capillary     Status: Abnormal   Collection Time: 04/03/15 11:44 AM  Result Value Ref Range   Glucose-Capillary 198 (H) 65 - 99 mg/dL   Dg Chest Port 1 View  04/02/2015  CLINICAL DATA:  Shortness of breath beginning 1 hour ago, nausea, dry heaving, hypertension, lung cancer, COPD,  CABG, former smoker EXAM: PORTABLE CHEST 1 VIEW COMPARISON:  Portable exam 2048 hours compared to 11/18/2014 FINDINGS: Normal heart size post median sternotomy. Stable mediastinal contours. Chronic LEFT apical opacity corresponding to posttherapy changes from lung cancer, unchanged since 06/03/2013. Underlying emphysematous changes. Skin fold projects over RIGHT upper lobe. No pulmonary infiltrate, pleural effusion or pneumothorax. Bones demineralized. IMPRESSION: Posttherapy changes and scarring at the LEFT apex, stable. COPD changes without acute infiltrate. Electronically Signed   By: Lavonia Dana M.D.   On: 04/02/2015 21:00     ASSESSMENT: Pt is a 81 yo with presyncopal spell yesterday  Has had intermitt diarrhea Today he was not orthostatic   EKG with evidence of conduction dz Echo with normal LV and RV function  Etiology for spell still unclear  QUestion arrhythmia vs high grade conduction block  I would recomm an event monitor to evaluate for block Will arrange for this along with outpt follow up    2  CAD  I am not convinced of active angina  Follow

## 2015-04-04 ENCOUNTER — Other Ambulatory Visit: Payer: Self-pay

## 2015-04-04 ENCOUNTER — Inpatient Hospital Stay (INDEPENDENT_AMBULATORY_CARE_PROVIDER_SITE_OTHER): Payer: Medicare Other

## 2015-04-04 DIAGNOSIS — N183 Chronic kidney disease, stage 3 (moderate): Secondary | ICD-10-CM

## 2015-04-04 DIAGNOSIS — R55 Syncope and collapse: Secondary | ICD-10-CM

## 2015-04-04 MED ORDER — TORSEMIDE 20 MG PO TABS: 20.0000 mg | ORAL_TABLET | Freq: Every morning | ORAL | Status: DC

## 2015-04-04 MED ORDER — PREDNISONE 10 MG PO TABS: ORAL_TABLET | ORAL | Status: DC

## 2015-04-04 NOTE — Progress Notes (Signed)
PROGRESS NOTE  [name]:[id number] DOB: 07-Oct-1921 DOA: 04/02/2015 PCP: [name] Lange, MD  Summary: 81 y/o male with a hx of CAD, HLD, HTN, CKD stage III, normocytic anemia, and COPD presented s/p an episode of near syncope. While in the ED, he was noted to have bilaterally wheezing. He was admitted for further management.   Assessment/Plan: 1. Near-syncope, no further episodes. Serial troponins negative, echo reassuring. EKG SR, RBBB, NSCSLT 11/18/2014. Cardiology recommends outpatient event monitor.  2. COPD exacerbation, resolved with steroids and bronchodilators. CXR unremarkable. 3. PMH CAD s/p CABG, stable. Continue ASA, atorvastatin, and metoprolol.  4. Chronic diastolic CHF, stable.   5. Chronic diarrhea, stable. Followed up with Dr. Laural [name] as outpatient 6. CKD stage III-IV, horseshoe kidney, creatinine at baseline.  7. Anemia of chronic disease, stable. Appears to be at baseline.   Overall improved. Discharge home today with event monitor.    F/u with Dr. Laural [name] as an outpatient.  Code Status: Full DVT prophylaxis: Lovenox Family Communication: Discussed with patient, wife and son who understand and have no concerns at this time. Disposition Plan: Discharge home today   [name] Hodgkins, MD  [facility]  Pager [phone] If 7PM-7AM, please contact night-coverage at [email or web address], password [facility] 04/04/2015, 6:29 AM  LOS: 1 day   Consultants:  Cardiology  Procedures:  ECHO Study Conclusions  - Left ventricle: The cavity size was normal. Wall thickness was increased in a pattern of mild LVH. Systolic function was normal. The estimated ejection fraction was in the range of 60% to 65%. Doppler parameters are consistent with abnormal left ventricular relaxation (grade 1 diastolic dysfunction). - Mitral valve: Calcified annulus. Mildly thickened leaflets . - Left atrium: The atrium was mildly dilated. - Pulmonary arteries: PA peak pressure: 50 mm  Hg (S).  Antibiotics:   none  HPI/Subjective: Feels good. Denies any nausea, vomiting, CP, or SOB.  Objective: Filed Vitals:   04/03/15 2008 04/03/15 2018 04/03/15 2101 04/04/15 0504  BP:   124/46 144/50  Pulse:   81 63  Temp:   98.6 F (37 C) 98.4 F (36.9 C)  TempSrc:   Oral Oral  Resp:   17 16  Height:      Weight:      SpO2: 97% 98% 96% 96%    Intake/Output Summary (Last 24 hours) at 04/04/15 0629 Last data filed at 04/04/15 0528  Gross per 24 hour  Intake    360 ml  Output    300 ml  Net     60 ml     Filed Weights   04/02/15 2023 04/02/15 2311  Weight: 68.947 kg (152 lb) 69.2 kg (152 lb 8.9 oz)    Exam:  VSS, afebrile General:  Appears comfortable, calm. Cardiovascular: Regular rate and rhythm, no murmur, rub or gallop. No lower extremity edema. Telemetry: SR Respiratory: Clear to auscultation bilaterally, no wheezes, rales or rhonchi. Normal respiratory effort. Psychiatric: grossly normal mood and affect, speech fluent and appropriate  New data reviewed:  No new data  Pertinent data since admission:  Creatinine 1.90, BUN 49  BNP 270  Troponin negative  Hgb 10.7  Scheduled Meds: . aspirin EC  81 mg Oral Daily  . atorvastatin  40 mg Oral q1800  . enoxaparin (LOVENOX) injection  30 mg Subcutaneous Q24H  . ipratropium-albuterol  3 mL Nebulization BID  . metoprolol  25 mg Oral BID  . potassium chloride  10 mEq Oral BID  . predniSONE  40 mg Oral Q breakfast  .  sodium chloride  3 mL Intravenous Q12H  . tamsulosin  0.4 mg Oral Daily  . torsemide  20 mg Oral Daily   Continuous Infusions:   Active Problems:   Essential hypertension   CKD (chronic kidney disease), stage III   COPD (chronic obstructive pulmonary disease) ([facility])   COPD exacerbation ([facility])   Near syncope   Diarrhea   Dizziness   Coronary artery disease involving coronary bypass graft of native heart without angina pectoris    By signing my name below, I, [name]  attest that this documentation has been prepared under the direction and in the presence of [name] Hodgkins, MD Electronically signed: [name], Scribe.  04/04/2015  I personally performed the services described in this documentation. All medical record entries made by the scribe were at my direction. I have reviewed the chart and agree that the record reflects my personal performance and is accurate and complete. [name] Hodgkins, MD

## 2015-04-04 NOTE — Care Management Important Message (Signed)
Important Message  Patient Details  Name: [name] MRN: [id number] Date of Birth: March 29, 1922   Medicare Important Message Given:  N/A - LOS <3 / Initial given by admissions    Joylene Draft, RN 04/04/2015, 11:54 AM

## 2015-04-04 NOTE — Progress Notes (Signed)
Central telemetry notified of discharge, telemetry removed, event monitor placed by cardiology staff.

## 2015-04-04 NOTE — Discharge Summary (Signed)
Physician Discharge Summary  [name] KPT:[id number] DOB: 1921-11-12 DOA: 04/02/2015  PCP: Sallee Lange, MD  Admit date: 04/02/2015 Discharge date: 04/04/2015  Recommendations for Outpatient Follow-up:  1. Follow up with cardiology on results of event monitor.  Follow-up Information    Follow up with Jory Sims, NP On 04/23/2015.   Specialties:  Nurse Practitioner, Radiology, Cardiology   Why:  at 1:50 pm   Contact information:   [address] [place] [address] [phone]        Discharge Diagnoses:  1. Near-syncope. 2. COPD exacerbation. 3. PMH CAD s/p CABG, stable. 4. Chronic diastolic CHF 5. CKD stage III-IV, horseshoe kidney. 6. Anemia of chronic disease.  Discharge Condition: Improved Disposition: Home  Diet recommendation: Heart healthy  Filed Weights   04/02/15 2023 04/02/15 2311  Weight: 68.947 kg (152 lb) 69.2 kg (152 lb 8.9 oz)    History of present illness:  81 y/o male with a hx of CAD, HLD, HTN, CKD stage III, normocytic anemia, and COPD presented with near syncope. While in the ED, he was noted to have bilaterally wheezing. He was admitted for further management.   Hospital Course:  Patient presented s/p a pre-syncopal episode. His initial workup revealed a negative troponin, relatively unremarkable ECHO, and an unchanged EKG. Cardiology was consulted and recommended outpatient follow up for an event monitor to evaluate for a block. He was noted to have a COPD exacerbation which resolved with steroids and bronchodilators. His CXR was negative for any acute process. He also complained of chronic diarrhea.  Patient is followed by Dr. Laural Golden and will see him as an outpatient.   Individual issues as below:  1. Near-syncope, no further episodes. Serial troponins negative, echo reassuring. EKG SR, RBBB, NSCSLT 11/18/2014. Cardiology recommends outpatient event monitor.  2. COPD exacerbation, resolved with steroids and bronchodilators. CXR  unremarkable. 3. PMH CAD s/p CABG, stable. Continue ASA, atorvastatin, and metoprolol.  4. Chronic diastolic CHF, stable.  5. Chronic diarrhea, stable. Followed up with Dr. Laural Golden as outpatient 6. CKD stage III-IV, horseshoe kidney, creatinine at baseline.  7. Anemia of chronic disease, stable. Appears to be at baseline.  Consultants:  Cardiology   Procedures:  ECHO  Study Conclusions  - Left ventricle: The cavity size was normal. Wall thickness was increased in a pattern of mild LVH. Systolic function was normal. The estimated ejection fraction was in the range of 60% to 65%. Doppler parameters are consistent with abnormal left ventricular relaxation (grade 1 diastolic dysfunction). - Mitral valve: Calcified annulus. Mildly thickened leaflets . - Left atrium: The atrium was mildly dilated. - Pulmonary arteries: PA peak pressure: 50 mm Hg (S).  Antibiotics:   none    Discharge Instructions Discharge Instructions    Activity as tolerated - No restrictions    Complete by:  As directed      Diet - low sodium heart healthy    Complete by:  As directed      Discharge instructions    Complete by:  As directed   Call your physician or seek immediate medical attention for dizziness, passing out, chest pain or worsening of condition.            Discharge Medication List as of 04/04/2015 11:43 AM    START taking these medications   Details  predniSONE (DELTASONE) 10 MG tablet Take 20 mg by mouth daily for 3 days, then take 10 mg by mouth daily for 3 days, then stop., Normal  CONTINUE these medications which have CHANGED   Details  torsemide (DEMADEX) 20 MG tablet Take 1 tablet (20 mg total) by mouth every morning., Starting 04/04/2015, Until Discontinued, No Print      CONTINUE these medications which have NOT CHANGED   Details  aspirin EC 81 MG tablet Take 81 mg by mouth daily., Until Discontinued, Historical Med    atorvastatin (LIPITOR) 40 MG  tablet TAKE 1 TABLET (40 MG TOTAL) BY MOUTH DAILY., Normal    fluticasone (FLONASE) 50 MCG/ACT nasal spray Place 1 spray into the nose daily. , Until Discontinued, Historical Med    KLOR-CON M10 10 MEQ tablet TAKE 1 TABLET BY MOUTH TWICE A DAY, Normal    metoprolol (LOPRESSOR) 50 MG tablet TAKE 1 TABLET (50 MG TOTAL) BY MOUTH DAILY., Normal    Multiple Vitamin (MULTIVITAMIN WITH MINERALS) TABS Take 1 tablet by mouth daily., Until Discontinued, Historical Med    nitroGLYCERIN (NITROSTAT) 0.4 MG SL tablet Place 1 tablet (0.4 mg total) under the tongue every 5 (five) minutes as needed for chest pain., Starting 09/19/2013, Until Discontinued, Normal    SYMBICORT 160-4.5 MCG/ACT inhaler INHALE 1 PUFF INTO THE LUNGS 2 (TWO) TIMES DAILY., Normal    tamsulosin (FLOMAX) 0.4 MG CAPS capsule One at bedtime, Normal    VENTOLIN HFA 108 (90 BASE) MCG/ACT inhaler INHALE 2 PUFFS INTO THE LUNGS EVERY 6 HOURS AS NEEDED FOR SHORTNESS OF BREATH, Normal       Allergies  Allergen Reactions  . Beta Adrenergic Blockers Hives and Swelling  . Levaquin [Levofloxacin In D5w] Nausea And Vomiting  . Penicillins Hives and Swelling    Has patient had a PCN reaction causing immediate rash, facial/tongue/throat swelling, SOB or lightheadedness with hypotension: Yes Has patient had a PCN reaction causing severe rash involving mucus membranes or skin necrosis: No Has patient had a PCN reaction that required hospitalization No Has patient had a PCN reaction occurring within the last 10 years: No If all of the above answers are "NO", then may proceed with Cephalosporin use.   . Sulfonamide Derivatives Hives and Swelling    The results of significant diagnostics from this hospitalization (including imaging, microbiology, ancillary and laboratory) are listed below for reference.    Significant Diagnostic Studies: Dg Chest Port 1 View  04/02/2015  CLINICAL DATA:  Shortness of breath beginning 1 hour ago, nausea, dry  heaving, hypertension, lung cancer, COPD, CABG, former smoker EXAM: PORTABLE CHEST 1 VIEW COMPARISON:  Portable exam 2048 hours compared to 11/18/2014 FINDINGS: Normal heart size post median sternotomy. Stable mediastinal contours. Chronic LEFT apical opacity corresponding to posttherapy changes from lung cancer, unchanged since 06/03/2013. Underlying emphysematous changes. Skin fold projects over RIGHT upper lobe. No pulmonary infiltrate, pleural effusion or pneumothorax. Bones demineralized. IMPRESSION: Posttherapy changes and scarring at the LEFT apex, stable. COPD changes without acute infiltrate. Electronically Signed   By: Lavonia Dana M.D.   On: 04/02/2015 21:00      Labs: Basic Metabolic Panel:  Recent Labs Lab 04/02/15 2100 04/03/15 0454  NA 138 140  K 4.5 4.5  CL 106 105  CO2 25 26  GLUCOSE 126* 136*  BUN 49* 50*  CREATININE 1.90* 2.07*  CALCIUM 8.9 9.0   Liver Function Tests:  Recent Labs Lab 04/02/15 2100 04/03/15 0454  AST 25 23  ALT 17 16*  ALKPHOS 95 87  BILITOT 0.7 0.8  PROT 6.6 6.3*  ALBUMIN 3.8 3.6   CBC:  Recent Labs Lab 04/02/15 2100 04/03/15 0454  WBC 9.5 6.8  NEUTROABS 5.0  --   HGB 10.7* 10.3*  HCT 32.7* 31.4*  MCV 97.6 98.4  PLT 186 191   Cardiac Enzymes:  Recent Labs Lab 04/02/15 2100 04/02/15 2315 04/03/15 0454 04/03/15 1126  TROPONINI <0.03 <0.03 0.03 0.03      Recent Labs  04/02/15 2100  BNP 270.0*    CBG:  Recent Labs Lab 04/03/15 0802 04/03/15 1144 04/03/15 1700  GLUCAP 143* 198* 216*    Active Problems:   Essential hypertension   CKD (chronic kidney disease), stage III   COPD (chronic obstructive pulmonary disease) (HCC)   COPD exacerbation (HCC)   Near syncope   Diarrhea   Dizziness   Coronary artery disease involving coronary bypass graft of native heart without angina pectoris   Time coordinating discharge: 30 minutes  Signed:  Murray Hodgkins, MD [facility] 04/04/2015, 7:51 AM    By  signing my name below, I, Rosalie Doctor attest that this documentation has been prepared under the direction and in the presence of Murray Hodgkins, MD Electronically signed: Rosalie Doctor, Scribe.  04/04/2015  I personally performed the services described in this documentation. All medical record entries made by the scribe were at my direction. I have reviewed the chart and agree that the record reflects my personal performance and is accurate and complete. Murray Hodgkins, MD

## 2015-04-04 NOTE — Progress Notes (Signed)
Discharge instructions reviewed with patient and his family, questions answered, understanding verbalized.

## 2015-04-04 NOTE — Progress Notes (Signed)
Left ;via wheelchair accompanied by staff and family for discharge home with event monitor in place in care of family.  Stable at discharge.

## 2015-04-04 NOTE — Care Management Note (Signed)
Case Management Note  Patient Details  Name: [name] MRN: [id number] Date of Birth: 1922-01-17  Subjective/Objective:                    Action/Plan:   Expected Discharge Date:                  Expected Discharge Plan:  Home/Self Care  In-House Referral:  NA  Discharge planning Services  CM Consult  Post Acute Care Choice:  NA Choice offered to:  NA  DME Arranged:    DME Agency:     HH Arranged:    HH Agency:     Status of Service:  Completed, signed off  Medicare Important Message Given:  N/A - LOS <3 / Initial given by admissions Date Medicare IM Given:    Medicare IM give by:    Date Additional Medicare IM Given:    Additional Medicare Important Message give by:     If discussed at [place] of Stay Meetings, dates discussed:    Additional Comments: Pt discharged home today. Cardiology has placed event monitor. No CM needs noted. Christinia Gully Appling, RN 04/04/2015, 11:55 AM

## 2015-04-05 LAB — URINE CULTURE
Culture: NO GROWTH
Special Requests: NORMAL

## 2015-04-09 DIAGNOSIS — L851 Acquired keratosis [keratoderma] palmaris et plantaris: Secondary | ICD-10-CM | POA: Diagnosis not present

## 2015-04-09 DIAGNOSIS — Q828 Other specified congenital malformations of skin: Secondary | ICD-10-CM | POA: Diagnosis not present

## 2015-04-09 DIAGNOSIS — M79673 Pain in unspecified foot: Secondary | ICD-10-CM | POA: Diagnosis not present

## 2015-04-19 ENCOUNTER — Telehealth: Payer: Self-pay | Admitting: Family Medicine

## 2015-04-19 MED ORDER — POTASSIUM CHLORIDE CRYS ER 10 MEQ PO TBCR: 10.0000 meq | EXTENDED_RELEASE_TABLET | Freq: Two times a day (BID) | ORAL | Status: DC

## 2015-04-19 NOTE — Telephone Encounter (Signed)
Pt is needing a refill on his KLOR-CON M10 10 MEQ tablet     [facility]

## 2015-04-19 NOTE — Telephone Encounter (Signed)
Notified son Ronalee Belts) that med was sent to pharmacy.

## 2015-04-22 NOTE — Progress Notes (Signed)
Cardiology Office Note   Date:  04/23/2015   ID:  [name], DOB 07/18/1921, MRN [id number]  PCP:  Sallee Lange, MD  Cardiologist:   Jory Sims, NP   No chief complaint on file.     History of Present Illness: [name] is a 82 y.o. male who presents for ongoing assessment and management of CAD, CABG in 1995, negative stress in 2003, hypertension, with other history to include COPD and CKD.  She is here for post hospital follow up for chest pain and near syncope. She was ruled out for cardiac etiology.   Echo 03/2015 Left ventricle: The cavity size was normal. Wall thickness was increased in a pattern of mild LVH. Systolic function was normal. The estimated ejection fraction was in the range of 60% to 65%. Doppler parameters are consistent with abnormal left ventricular relaxation (grade 1 diastolic dysfunction). - Mitral valve: Calcified annulus. Mildly thickened leaflets . - Left atrium: The atrium was mildly dilated. - Pulmonary arteries: PA peak pressure: 50 mm Hg (S).  He is feeling better from a cardiology standpoint but is having abdominal tenderness and a lot of gas. He denies chest pain, but has chronic dyspnea from COPD  Past Medical History  Diagnosis Date  . ASCVD (arteriosclerotic cardiovascular disease)      CABG in 04/1993; and negative stress nuclear study in 08/2001  . Hyperlipidemia   . Syncope   . Hypertension   . Tobacco abuse, in remission     40 pack years; discontinued in 1980  . Cerebrovascular disease     Right carotid bruit-40-69% left internal carotid artery stenosis in 4/06; followed [facility]  . Chronic kidney disease, stage II (mild)     Creatinine-1.6 in 09/2008  . Degenerative joint disease of knee, left   . Normocytic anemia   . Pulmonary disease   . Cancer ([place])     skin  . Lung cancer ([place]) 2013  . Prediabetes   . CAD (coronary artery disease)   . COPD (chronic obstructive pulmonary disease) ([place])   . Benign essential  tremor   . Horseshoe kidney   . Renal insufficiency     Past Surgical History  Procedure Laterality Date  . Coronary artery bypass graft  1995  . Colonoscopy w/ polypectomy  1985  . Pilonidal cyst excision  1948  . Lesion excision    . Cardiac surgery       Current Outpatient Prescriptions  Medication Sig Dispense Refill  . aspirin EC 81 MG tablet Take 81 mg by mouth daily.    Marland Kitchen atorvastatin (LIPITOR) 40 MG tablet TAKE 1 TABLET (40 MG TOTAL) BY MOUTH DAILY. 90 tablet 0  . fluticasone (FLONASE) 50 MCG/ACT nasal spray Place 1 spray into the nose daily.     . metoprolol (LOPRESSOR) 50 MG tablet TAKE 1 TABLET (50 MG TOTAL) BY MOUTH DAILY. 90 tablet 1  . Multiple Vitamin (MULTIVITAMIN WITH MINERALS) TABS Take 1 tablet by mouth daily.    . nitroGLYCERIN (NITROSTAT) 0.4 MG SL tablet Place 1 tablet (0.4 mg total) under the tongue every 5 (five) minutes as needed for chest pain. 25 tablet 11  . potassium chloride (KLOR-CON M10) 10 MEQ tablet Take 1 tablet (10 mEq total) by mouth 2 (two) times daily. 180 tablet 0  . simethicone (MYLICON) 80 MG chewable tablet Chew 80 mg by mouth every 6 (six) hours as needed for flatulence.    . SYMBICORT 160-4.5 MCG/ACT inhaler INHALE 1 PUFF INTO THE  LUNGS 2 (TWO) TIMES DAILY. 20.4 Inhaler 5  . torsemide (DEMADEX) 20 MG tablet Take 1 tablet (20 mg total) by mouth every morning.    . VENTOLIN HFA 108 (90 BASE) MCG/ACT inhaler INHALE 2 PUFFS INTO THE LUNGS EVERY 6 HOURS AS NEEDED FOR SHORTNESS OF BREATH 54 Inhaler 1   No current facility-administered medications for this visit.    Allergies:   Beta adrenergic blockers; Levaquin; Penicillins; and Sulfonamide derivatives    Social History:  The patient  reports that he quit smoking about 41 years ago. His smoking use included Cigarettes. He has a 40 pack-year smoking history. He quit smokeless tobacco use about 34 years ago. His smokeless tobacco use included Chew. He reports that he does not drink alcohol or  use illicit drugs.   Family History:  The patient's family history includes Cancer in his brother; Heart attack in his brother.    ROS: All other systems are reviewed and negative. Unless otherwise mentioned in H&P    PHYSICAL EXAM: VS:  BP 126/64 mmHg  Pulse 64  Ht '5\' 7"'$  (1.702 m)  Wt 154 lb (69.854 kg)  BMI 24.11 kg/m2  SpO2 97% , BMI Body mass index is 24.11 kg/(m^2). GEN: Well nourished, well developed, in no acute distress HEENT: normal Neck: no JVD, carotid bruits, or masses Cardiac: RRR; no murmurs, rubs, or gallops,no edema  Respiratory:  clear to auscultation bilaterally, normal work of breathing GI: soft, nontender, nondistended, + BS MS: no deformity or atrophy Skin: warm and dry, no rash Neuro:  Strength and sensation are intact Psych: euthymic mood, full affect  Recent Labs: 04/02/2015: B Natriuretic Peptide 270.0* 04/03/2015: ALT 16*; BUN 50*; Creatinine, Ser 2.07*; Hemoglobin 10.3*; Platelets 191; Potassium 4.5; Sodium 140    Lipid Panel    Component Value Date/Time   CHOL 168 07/21/2014 0809   CHOL 149 09/21/2013 0702   TRIG 65 07/21/2014 0809   HDL 101 07/21/2014 0809   HDL 74 09/21/2013 0702   CHOLHDL 1.7 07/21/2014 0809   CHOLHDL 2.0 09/21/2013 0702   VLDL 13 09/21/2013 0702   LDLCALC 54 07/21/2014 0809   LDLCALC 62 09/21/2013 0702      Wt Readings from Last 3 Encounters:  04/23/15 154 lb (69.854 kg)  04/02/15 152 lb 8.9 oz (69.2 kg)  03/05/15 152 lb 11.2 oz (69.264 kg)      ASSESSMENT AND PLAN:  1.  CAD:Hx of CABG:  Doing much better, no cardiac complaints. Continue medical management with BB, statin, ASA.   2. Abdominal pain with flatus: States that he feels it on the right quadrant and is burping and having flatus. With COPD and some probable air swallowing, I have suggested simethicone prn. He is due to see GI next week. Will defer to them for more formal evaluation.   3. Dizziness; No further complaints at this time.    Current  medicines are reviewed at length with the patient today.    Labs/ tests ordered today include: CBC, BMET-copy to Dr. Wolfgang Phoenix.   Orders Placed This Encounter  Procedures  . CBC  . Basic Metabolic Panel (BMET)     Disposition:   FU 3 months.  Signed, Jory Sims, NP  04/23/2015 3:59 PM    [name] [address], [place], [place] [address] Phone: [phone]; Fax: [phone]

## 2015-04-23 ENCOUNTER — Encounter: Payer: Self-pay | Admitting: Adult Health

## 2015-04-23 ENCOUNTER — Ambulatory Visit (INDEPENDENT_AMBULATORY_CARE_PROVIDER_SITE_OTHER): Payer: Medicare Other | Admitting: Adult Health

## 2015-04-23 VITALS — BP 126/64 | HR 64 | Ht 67.0 in | Wt 154.0 lb

## 2015-04-23 DIAGNOSIS — Z79899 Other long term (current) drug therapy: Secondary | ICD-10-CM | POA: Diagnosis not present

## 2015-04-23 DIAGNOSIS — I2510 Atherosclerotic heart disease of native coronary artery without angina pectoris: Secondary | ICD-10-CM

## 2015-04-23 NOTE — Patient Instructions (Signed)
Your physician wants you to follow-up in: 3 months with [name] Long NP You will receive a reminder letter in the mail two months in advance. If you don't receive a letter, please call our office to schedule the follow-up appointment.    Your physician recommends that you continue on your current medications as directed. Please refer to the Current Medication list given to you today.     If you need a refill on your cardiac medications before your next appointment, please call your pharmacy.    Your physician recommends that you return for lab work in: cbc,bmet   Due now    Take Simethicone 80 mg as directed for gas.[employer]'s brand is called Equate   Same as GAS-X  Only cheaper       Thank you for choosing [facility] !

## 2015-04-23 NOTE — Progress Notes (Signed)
Name: [name]    DOB: [birth year]  Age: 82 y.o.  MR#: [id number]       PCP:  Sallee Lange, MD      Insurance: Payor: [employer] MEDICARE / Plan: [facility] MEDICARE / Product Type: *No Product type* /   CC:   No chief complaint on file.   VS Filed Vitals:   04/23/15 1341  BP: 126/64  Pulse: 64  Height: '5\' 7"'$  (1.702 m)  Weight: 154 lb (69.854 kg)  SpO2: 97%    Weights Current Weight  04/23/15 154 lb (69.854 kg)  04/02/15 152 lb 8.9 oz (69.2 kg)  03/05/15 152 lb 11.2 oz (69.264 kg)    Blood Pressure  BP Readings from Last 3 Encounters:  04/23/15 126/64  04/04/15 148/46  03/05/15 112/72     Admit date:  (Not on file) Last encounter with RMR:  Visit date not found   Allergy Beta adrenergic blockers; Levaquin; Penicillins; and Sulfonamide derivatives  Current Outpatient Prescriptions  Medication Sig Dispense Refill  . aspirin EC 81 MG tablet Take 81 mg by mouth daily.    Marland Kitchen atorvastatin (LIPITOR) 40 MG tablet TAKE 1 TABLET (40 MG TOTAL) BY MOUTH DAILY. 90 tablet 0  . fluticasone (FLONASE) 50 MCG/ACT nasal spray Place 1 spray into the nose daily.     . metoprolol (LOPRESSOR) 50 MG tablet TAKE 1 TABLET (50 MG TOTAL) BY MOUTH DAILY. 90 tablet 1  . Multiple Vitamin (MULTIVITAMIN WITH MINERALS) TABS Take 1 tablet by mouth daily.    . nitroGLYCERIN (NITROSTAT) 0.4 MG SL tablet Place 1 tablet (0.4 mg total) under the tongue every 5 (five) minutes as needed for chest pain. 25 tablet 11  . potassium chloride (KLOR-CON M10) 10 MEQ tablet Take 1 tablet (10 mEq total) by mouth 2 (two) times daily. 180 tablet 0  . SYMBICORT 160-4.5 MCG/ACT inhaler INHALE 1 PUFF INTO THE LUNGS 2 (TWO) TIMES DAILY. 20.4 Inhaler 5  . torsemide (DEMADEX) 20 MG tablet Take 1 tablet (20 mg total) by mouth every morning.    . VENTOLIN HFA 108 (90 BASE) MCG/ACT inhaler INHALE 2 PUFFS INTO THE LUNGS EVERY 6 HOURS AS NEEDED FOR SHORTNESS OF BREATH 54 Inhaler 1   No current facility-administered medications for  this visit.    Discontinued Meds:    Medications Discontinued During This Encounter  Medication Reason  . predniSONE (DELTASONE) 10 MG tablet Error  . tamsulosin (FLOMAX) 0.4 MG CAPS capsule Error    Patient Active Problem List   Diagnosis Date Noted  . Dizziness   . Coronary artery disease involving coronary bypass graft of native heart without angina pectoris   . Near syncope 04/02/2015  . Diarrhea 04/02/2015  . COPD exacerbation ([place]) 06/04/2013  . SOB (shortness of breath) 06/04/2013  . Acute respiratory failure with hypoxia ([place]) 06/04/2013  . Hemoptysis 08/10/2012  . Prediabetes 08/28/2011  . Lung cancer ([place]) 08/25/2011  . COPD (chronic obstructive pulmonary disease) ([place]) 08/25/2011  . CKD (chronic kidney disease), stage III 12/07/2010  . Chest pain 08/18/2010  . Syncope   . Tobacco abuse, in remission   . Degenerative joint disease of knee, left   . COLONIC POLYPS 05/20/2009  . Hyperlipidemia 05/20/2009  . ANEMIA 05/20/2009  . TREMOR, ESSENTIAL 05/20/2009  . ATHEROSCLEROTIC CARDIOVASCULAR DISEASE 05/20/2009  . CEREBROVASCULAR DISEASE 05/20/2009  . Essential hypertension 01/14/2009    LABS    Component Value Date/Time   NA 140 04/03/2015 0454   NA 138 04/02/2015 2100  NA 139 11/18/2014 0832   NA 143 07/21/2014 0809   K 4.5 04/03/2015 0454   K 4.5 04/02/2015 2100   K 4.2 11/18/2014 0832   CL 105 04/03/2015 0454   CL 106 04/02/2015 2100   CL 104 11/18/2014 0832   CO2 26 04/03/2015 0454   CO2 25 04/02/2015 2100   CO2 28 11/18/2014 0832   GLUCOSE 136* 04/03/2015 0454   GLUCOSE 126* 04/02/2015 2100   GLUCOSE 125* 11/18/2014 0832   GLUCOSE 108* 07/21/2014 0809   BUN 50* 04/03/2015 0454   BUN 49* 04/02/2015 2100   BUN 33* 11/18/2014 0832   BUN 35 07/21/2014 0809   CREATININE 2.07* 04/03/2015 0454   CREATININE 1.90* 04/02/2015 2100   CREATININE 2.02* 11/18/2014 0832   CREATININE 2.07* 04/23/2014 0929   CREATININE 2.12* 10/23/2013 1042   CREATININE  1.90* 09/21/2013 0702   CALCIUM 9.0 04/03/2015 0454   CALCIUM 8.9 04/02/2015 2100   CALCIUM 8.8* 11/18/2014 0832   GFRNONAA 26* 04/03/2015 0454   GFRNONAA 29* 04/02/2015 2100   GFRNONAA 27* 11/18/2014 0832   GFRAA 31* 04/03/2015 0454   GFRAA 34* 04/02/2015 2100   GFRAA 32* 11/18/2014 0832   CMP     Component Value Date/Time   NA 140 04/03/2015 0454   NA 143 07/21/2014 0809   K 4.5 04/03/2015 0454   CL 105 04/03/2015 0454   CO2 26 04/03/2015 0454   GLUCOSE 136* 04/03/2015 0454   GLUCOSE 108* 07/21/2014 0809   BUN 50* 04/03/2015 0454   BUN 35 07/21/2014 0809   CREATININE 2.07* 04/03/2015 0454   CREATININE 2.07* 04/23/2014 0929   CALCIUM 9.0 04/03/2015 0454   PROT 6.3* 04/03/2015 0454   PROT 6.4 07/21/2014 0809   ALBUMIN 3.6 04/03/2015 0454   ALBUMIN 3.9 07/21/2014 0809   AST 23 04/03/2015 0454   ALT 16* 04/03/2015 0454   ALKPHOS 87 04/03/2015 0454   BILITOT 0.8 04/03/2015 0454   BILITOT 0.5 07/21/2014 0809   GFRNONAA 26* 04/03/2015 0454   GFRAA 31* 04/03/2015 0454       Component Value Date/Time   WBC 6.8 04/03/2015 0454   WBC 9.5 04/02/2015 2100   WBC 7.1 11/18/2014 0832   HGB 10.3* 04/03/2015 0454   HGB 10.7* 04/02/2015 2100   HGB 10.6* 11/18/2014 0832   HGB 12.2* 08/10/2012 0948   HCT 31.4* 04/03/2015 0454   HCT 32.7* 04/02/2015 2100   HCT 33.2* 11/18/2014 0832   MCV 98.4 04/03/2015 0454   MCV 97.6 04/02/2015 2100   MCV 96.8 11/18/2014 0832    Lipid Panel     Component Value Date/Time   CHOL 168 07/21/2014 0809   CHOL 149 09/21/2013 0702   TRIG 65 07/21/2014 0809   HDL 101 07/21/2014 0809   HDL 74 09/21/2013 0702   CHOLHDL 1.7 07/21/2014 0809   CHOLHDL 2.0 09/21/2013 0702   VLDL 13 09/21/2013 0702   LDLCALC 54 07/21/2014 0809   LDLCALC 62 09/21/2013 0702    ABG    Component Value Date/Time   TCO2 27 09/19/2013 1512     Lab Results  Component Value Date   TSH 2.792 12/06/2010   BNP (last 3 results)  Recent Labs  04/02/15 2100  BNP  270.0*    ProBNP (last 3 results) No results for input(s): PROBNP in the last 8760 hours.  Cardiac Panel (last 3 results) No results for input(s): CKTOTAL, CKMB, TROPONINI, RELINDX in the last 72 hours.  Iron/TIBC/Ferritin/ %Sat    Component  Value Date/Time   IRON 85 08/16/2009 1921   TIBC 294 08/16/2009 1921   FERRITIN 91 05/17/2014 0950   IRONPCTSAT 29 08/16/2009 1921     EKG Orders placed or performed during the hospital encounter of 04/02/15  . EKG 12-Lead  . EKG 12-Lead  . EKG     Prior Assessment and Plan Problem List as of 04/23/2015      Cardiovascular and Mediastinum   Essential hypertension   ATHEROSCLEROTIC CARDIOVASCULAR DISEASE   Last Assessment & Plan 08/18/2010 Office Visit Written 08/18/2010  2:21 PM by Yehuda Savannah, MD    [name] currently has no symptoms to suggest myocardial ischemia; other than optimal control of risk factors,      CEREBROVASCULAR DISEASE   Syncope   Near syncope   Coronary artery disease involving coronary bypass graft of native heart without angina pectoris     Respiratory   COPD (chronic obstructive pulmonary disease) (HCC)   Lung cancer (HCC)   Hemoptysis   COPD exacerbation (HCC)   Acute respiratory failure with hypoxia (HCC)     Digestive   COLONIC POLYPS     Genitourinary   CKD (chronic kidney disease), stage III     Other   Hyperlipidemia   ANEMIA   TREMOR, ESSENTIAL   Tobacco abuse, in remission   Degenerative joint disease of knee, left   Chest pain   Last Assessment & Plan 08/18/2010 Office Visit Edited 08/18/2010  2:43 PM by Yehuda Savannah, MD    I doubt that the functional testing is worthwhile for a single episode of chest discomfort lasting a matter of seconds.  Fortunately, patient has had no recurrence and has fairly good exercise tolerance.  Risk factor control is excellent, current therapy will be continued.  Patient will be provided with a prescription for SL nitroglycerin to be used for recurrent  symptoms, if any.      Prediabetes   SOB (shortness of breath)   Diarrhea   Dizziness       Imaging: Dg Chest Port 1 View  04/02/2015  CLINICAL DATA:  Shortness of breath beginning 1 hour ago, nausea, dry heaving, hypertension, lung cancer, COPD, CABG, former smoker EXAM: PORTABLE CHEST 1 VIEW COMPARISON:  Portable exam 2048 hours compared to 11/18/2014 FINDINGS: Normal heart size post median sternotomy. Stable mediastinal contours. Chronic LEFT apical opacity corresponding to posttherapy changes from lung cancer, unchanged since 06/03/2013. Underlying emphysematous changes. Skin fold projects over RIGHT upper lobe. No pulmonary infiltrate, pleural effusion or pneumothorax. Bones demineralized. IMPRESSION: Posttherapy changes and scarring at the LEFT apex, stable. COPD changes without acute infiltrate. Electronically Signed   By: Lavonia Dana M.D.   On: 04/02/2015 21:00

## 2015-04-24 LAB — CBC
HEMATOCRIT: 32.2 % — AB (ref 39.0–52.0)
HEMOGLOBIN: 10.6 g/dL — AB (ref 13.0–17.0)
MCH: 30.9 pg (ref 26.0–34.0)
MCHC: 32.9 g/dL (ref 30.0–36.0)
MCV: 93.9 fL (ref 78.0–100.0)
MPV: 9.8 fL (ref 8.6–12.4)
Platelets: 251 10*3/uL (ref 150–400)
RBC: 3.43 MIL/uL — AB (ref 4.22–5.81)
RDW: 14.4 % (ref 11.5–15.5)
WBC: 6.8 10*3/uL (ref 4.0–10.5)

## 2015-04-24 LAB — BASIC METABOLIC PANEL
BUN: 36 mg/dL — ABNORMAL HIGH (ref 7–25)
CHLORIDE: 105 mmol/L (ref 98–110)
CO2: 25 mmol/L (ref 20–31)
Calcium: 8.9 mg/dL (ref 8.6–10.3)
Creat: 2.01 mg/dL — ABNORMAL HIGH (ref 0.70–1.11)
GLUCOSE: 87 mg/dL (ref 65–99)
POTASSIUM: 4.9 mmol/L (ref 3.5–5.3)
SODIUM: 142 mmol/L (ref 135–146)

## 2015-05-06 ENCOUNTER — Telehealth (INDEPENDENT_AMBULATORY_CARE_PROVIDER_SITE_OTHER): Payer: Self-pay | Admitting: *Deleted

## 2015-05-06 NOTE — Telephone Encounter (Signed)
On 03/05/15 at time of OV the patient weighed 152 lbs 11.2 oz. Today he presented to the OV for a weight check, 154.5 lbs. States that he is having no porblems.

## 2015-05-09 ENCOUNTER — Ambulatory Visit: Payer: Medicare Other | Admitting: Family Medicine

## 2015-05-10 ENCOUNTER — Encounter: Payer: Self-pay | Admitting: Family Medicine

## 2015-05-10 ENCOUNTER — Ambulatory Visit (INDEPENDENT_AMBULATORY_CARE_PROVIDER_SITE_OTHER): Payer: 59 | Admitting: Family Medicine

## 2015-05-10 VITALS — BP 110/72 | Ht 67.0 in | Wt 153.8 lb

## 2015-05-10 DIAGNOSIS — I5032 Chronic diastolic (congestive) heart failure: Secondary | ICD-10-CM | POA: Insufficient documentation

## 2015-05-10 DIAGNOSIS — N183 Chronic kidney disease, stage 3 unspecified: Secondary | ICD-10-CM

## 2015-05-10 DIAGNOSIS — I10 Essential (primary) hypertension: Secondary | ICD-10-CM | POA: Diagnosis not present

## 2015-05-10 DIAGNOSIS — J438 Other emphysema: Secondary | ICD-10-CM

## 2015-05-10 DIAGNOSIS — R269 Unspecified abnormalities of gait and mobility: Secondary | ICD-10-CM

## 2015-05-10 DIAGNOSIS — G250 Essential tremor: Secondary | ICD-10-CM

## 2015-05-10 DIAGNOSIS — E785 Hyperlipidemia, unspecified: Secondary | ICD-10-CM | POA: Diagnosis not present

## 2015-05-10 MED ORDER — METOPROLOL TARTRATE 50 MG PO TABS: ORAL_TABLET | ORAL | Status: DC

## 2015-05-10 MED ORDER — BUDESONIDE-FORMOTEROL FUMARATE 160-4.5 MCG/ACT IN AERO: INHALATION_SPRAY | RESPIRATORY_TRACT | Status: DC

## 2015-05-10 MED ORDER — POTASSIUM CHLORIDE CRYS ER 10 MEQ PO TBCR: 10.0000 meq | EXTENDED_RELEASE_TABLET | Freq: Two times a day (BID) | ORAL | Status: DC

## 2015-05-10 MED ORDER — ALBUTEROL SULFATE HFA 108 (90 BASE) MCG/ACT IN AERS: INHALATION_SPRAY | RESPIRATORY_TRACT | Status: DC

## 2015-05-10 MED ORDER — ATORVASTATIN CALCIUM 40 MG PO TABS: ORAL_TABLET | ORAL | Status: DC

## 2015-05-10 NOTE — Progress Notes (Signed)
   Subjective:    Patient ID: [name], male    DOB: 09-09-1921, 82 y.o.   MRN: [id number]  Hyperlipidemia This is a chronic problem. The current episode started more than 1 year ago. Associated symptoms include shortness of breath. Pertinent negatives include no chest pain. Treatments tried: generic lipitor. There are no compliance problems.  Risk factors for coronary artery disease include dyslipidemia, hypertension and a sedentary lifestyle.   Patient states he took a fall yesterday and also had a recent hospitalization in December We discussed his hospitalization the patient had a near passing out spell was significant diarrhea fatigue tiredness and felt washed out needed an ambulance to go to the hospital the results of his lab work and his echo were reviewed with the patient  He states his breathing is been doing okay overall. He has not had any hemoptysis wheezing or difficulty breathing lately uses his medications States his tremor is stable has not worsened metoprolol helps him with this he tolerates metoprolol well Has chronic kidney disease related to the age he also does have a horseshoe kidney healthy eating monitoring recommended  Review of Systems  Constitutional: Negative for activity change, appetite change and fatigue.  HENT: Negative for congestion.   Respiratory: Positive for shortness of breath. Negative for cough.   Cardiovascular: Negative for chest pain.  Gastrointestinal: Negative for abdominal pain.  Endocrine: Negative for polydipsia and polyphagia.  Neurological: Negative for weakness.  Psychiatric/Behavioral: Negative for confusion.       Objective:   Physical Exam  Constitutional: He appears well-nourished. No distress.  Cardiovascular: Normal rate, regular rhythm and normal heart sounds.   No murmur heard. Pulmonary/Chest: Effort normal and breath sounds normal. No respiratory distress.  Musculoskeletal: He exhibits no edema.  Lymphadenopathy:    He  has no cervical adenopathy.  Neurological: He is alert.  Psychiatric: His behavior is normal.  Vitals reviewed.    25 minutes was spent with the patient. Greater than half the time was spent in discussion and answering questions and counseling regarding the issues that the patient came in for today.      Assessment & Plan:  1. Abnormality of gait This patient has had a couple different falls over the past several months he does pretty good with his walker but because of his tremor in his right hand he is somewhat unsteady I believe he would benefit from gait training with physical therapy. - Ambulatory referral to Physical Therapy  2. Essential hypertension Blood pressure was checked laying sitting standing under good control I would not recommend adjusting medicine - Basic metabolic panel - Hepatic function panel  3. Other emphysema ([place]) He does uses lung medications on a regular basis and is stable currently  4. CKD (chronic kidney disease), stage III His creatinine is stable. He does have a history of a horseshoe kidney. I believe this is probably age-related. Healthy diet recommended - Basic metabolic panel - Hepatic function panel  5. Hyperlipidemia He will need to do cholesterol and liver profile in 3 months when he follows up along with kidney functions - Lipid panel - Hepatic function panel  6. Essential tremor The tremor does bother him but does not appear to be Parkinson's more so in the right arm than the left  7. Diastolic CHF, chronic ([place]) He had a recent echo completed which shows ejection fraction good but significant diastolic CHF currently stable under decent control

## 2015-05-23 ENCOUNTER — Ambulatory Visit (INDEPENDENT_AMBULATORY_CARE_PROVIDER_SITE_OTHER): Payer: 59 | Admitting: Family Medicine

## 2015-05-23 ENCOUNTER — Encounter: Payer: Self-pay | Admitting: Family Medicine

## 2015-05-23 VITALS — BP 128/74 | Ht 67.0 in | Wt 153.0 lb

## 2015-05-23 DIAGNOSIS — W2209XA Striking against other stationary object, initial encounter: Secondary | ICD-10-CM

## 2015-05-23 DIAGNOSIS — Z23 Encounter for immunization: Secondary | ICD-10-CM

## 2015-05-23 DIAGNOSIS — S61412A Laceration without foreign body of left hand, initial encounter: Secondary | ICD-10-CM | POA: Diagnosis not present

## 2015-05-23 DIAGNOSIS — T148XXA Other injury of unspecified body region, initial encounter: Secondary | ICD-10-CM

## 2015-05-23 NOTE — Progress Notes (Signed)
   Subjective:    Patient ID: [name], male    DOB: 06-05-1921, 82 y.o.   MRN: [id number]  HPIpt hit left hand on door yesterday. Cut was bleeding this am.   Patient slipped and injured his hand. Could not get the bleeding to stop overnight.  Review of Systems Patient with some intermittent bleeding   the bleeding has stopped at this point. Objective:   Physical Exam Hand laceration on the right hand. Bleeding controlled. And platelets.       Assessment & Plan:  Hand laceration/skin tear should gradually heal up and it's place If bleeding starts resuming patient is to let us now. Tetanus shot today.

## 2015-05-27 ENCOUNTER — Ambulatory Visit (HOSPITAL_COMMUNITY): Payer: Medicare Other | Attending: Family Medicine | Admitting: Physical Therapy

## 2015-05-27 DIAGNOSIS — R2689 Other abnormalities of gait and mobility: Secondary | ICD-10-CM | POA: Insufficient documentation

## 2015-05-27 DIAGNOSIS — R29898 Other symptoms and signs involving the musculoskeletal system: Secondary | ICD-10-CM | POA: Insufficient documentation

## 2015-05-27 DIAGNOSIS — R269 Unspecified abnormalities of gait and mobility: Secondary | ICD-10-CM

## 2015-05-27 DIAGNOSIS — R296 Repeated falls: Secondary | ICD-10-CM | POA: Diagnosis present

## 2015-05-27 NOTE — Therapy (Signed)
[place], [place], [address] Phone: [phone]   Fax:  [phone]  Physical Therapy Evaluation  Patient Details  Name: [name] MRN: [id number] Date of Birth: [birth year] Referring Provider: Sallee Lange   Encounter Date: 05/27/2015      PT End of Session - 05/27/15 1148    Visit Number 1   Number of Visits 8   Date for PT Re-Evaluation 06/26/15   Authorization Type Medicare   PT Start Time 0935   PT Stop Time 1016   PT Time Calculation (min) 41 min   Activity Tolerance Patient tolerated treatment well   Behavior During Therapy [facility] for tasks assessed/performed      Past Medical History  Diagnosis Date  . ASCVD (arteriosclerotic cardiovascular disease)      CABG in 04/1993; and negative stress nuclear study in 08/2001  . Hyperlipidemia   . Syncope   . Hypertension   . Tobacco abuse, in remission     40 pack years; discontinued in 1980  . Cerebrovascular disease     Right carotid bruit-40-69% left internal carotid artery stenosis in 4/06; followed [facility]  . Chronic kidney disease, stage II (mild)     Creatinine-1.6 in 09/2008  . Degenerative joint disease of knee, left   . Normocytic anemia   . Pulmonary disease   . Cancer ([facility])     skin  . Lung cancer ([place]) 2013  . Prediabetes   . CAD (coronary artery disease)   . COPD (chronic obstructive pulmonary disease) ([place])   . Benign essential tremor   . Horseshoe kidney   . Renal insufficiency     Past Surgical History  Procedure Laterality Date  . Coronary artery bypass graft  1995  . Colonoscopy w/ polypectomy  1985  . Pilonidal cyst excision  1948  . Lesion excision    . Cardiac surgery      There were no vitals filed for this visit.  Visit Diagnosis:  Recurrent falls  Bilateral leg weakness  Abnormality of gait  Unstable balance      Subjective Assessment - 05/27/15 0939    Subjective [name] states that he has fallen at least  five times in the past six months.  He has not fractured anything but has gotten several bruises.  He has a cane but he does not use it on a regular basis.  He has came to therapy hoping to decrease his falls and risk of injury    Pertinent History COPD, OA Bilateral knees, CABG 1995; lung cancer 2013   How long can you sit comfortably? no problem    How long can you stand comfortably? 30 minutes    How long can you walk comfortably? pt does not walk for long distances due to his knees    Patient Stated Goals to decrease the number of falls    Currently in Pain? No/denies            [facility] PT Assessment - 05/27/15 0001    Assessment   Medical Diagnosis abnormal gait   Referring Provider Nicki Reaper [place]    Onset Date/Surgical Date 02/13/15   Next MD Visit 08/25/2017   Prior Therapy none   Precautions   Precautions Fall   Restrictions   Weight Bearing Restrictions No   Balance Screen   Has the patient fallen in the past 6 months Yes   How many times? 5   Has the patient had a  decrease in activity level because of a fear of falling?  Yes   Is the patient reluctant to leave their home because of a fear of falling?  No   Home Environment   Living Environment Private residence   Living Arrangements Spouse/significant other   Type of [place] to enter   Entrance Stairs-Number of Steps 4   Entrance Stairs-Rails Right   Home Layout Two level   Alternate Level Stairs-Number of Steps 15   Prior Function   Level of [place] Independent   Vocation Retired   Leisure Family fitness,    Cognition   Overall Cognitive Status Within Functional Limits for tasks assessed   Observation/Other Assessments   Focus on Therapeutic Outcomes (FOTO)  53   Functional Tests   Functional tests Single leg stance;Sit to Stand   Sit to Stand   Comments 5 in 21.47 seconds    Posture/Postural Control   Posture/Postural Control Postural limitations   Postural Limitations Rounded  Shoulders;Forward head;Decreased lumbar lordosis;Increased thoracic kyphosis   ROM / Strength   AROM / PROM / Strength Strength   Strength   Strength Assessment Site Hip;Knee;Ankle   Right/Left Hip Right;Left   Right Hip Flexion 5/5   Right Hip Extension 4-/5   Left Hip Flexion 5/5   Left Hip Extension 4-/5   Right/Left Knee Right;Left   Right Knee Extension 5/5   Left Knee Extension 5/5   Right/Left Ankle Right;Left   Standardized Balance Assessment   Standardized Balance Assessment Berg Balance Test   Berg Balance Test   Sit to Stand Able to stand without using hands and stabilize independently   Standing Unsupported Able to stand safely 2 minutes   Sitting with Back Unsupported but Feet Supported on Floor or Stool Able to sit safely and securely 2 minutes   Stand to Sit Sits safely with minimal use of hands   Transfers Able to transfer safely, minor use of hands   Standing Unsupported with Eyes Closed Able to stand 10 seconds safely   Standing Ubsupported with Feet Together Able to place feet together independently and stand for 1 minute with supervision   From Standing, Reach Forward with Outstretched Arm Can reach forward >5 cm safely (2")   From Standing Position, Pick up Object from Floor Able to pick up shoe safely and easily   From Standing Position, Turn to Look Behind Over each Shoulder Looks behind one side only/other side shows less weight shift   Turn 360 Degrees Able to turn 360 degrees safely one side only in 4 seconds or less   Standing Unsupported, Alternately Place Feet on Step/Stool Able to stand independently and safely and complete 8 steps in 20 seconds   Standing Unsupported, One Foot in Front Able to plae foot ahead of the other independently and hold 30 seconds   Standing on One Leg Unable to try or needs assist to prevent fall   Total Score 46                   [facility] Adult PT Treatment/Exercise - 05/27/15 0001    Exercises   Exercises Lumbar    Lumbar Exercises: Seated   Sit to Stand 10 reps   Lumbar Exercises: Supine   Bridge 10 reps   Lumbar Exercises: Sidelying   Hip Abduction 10 reps                PT Education - 05/27/15 1147  Education provided Yes   Education Details home exercise program    Person(s) Educated Patient   Methods Explanation;Verbal cues;Handout   Comprehension Verbalized understanding;Returned demonstration          PT Short Term Goals - 05/27/15 1159    PT SHORT TERM GOAL #1   Title Pt will improve bilateral hip strength to 4+/5 to be able to come from sit to stand from a car without difficulty.    Time 2   Period Weeks   Status New   PT SHORT TERM GOAL #2   Title Pt to be performing a HEP to obtain his goals.   Time 2   Period Weeks   Status New   PT SHORT TERM GOAL #3   Title Pt to report that he is able to walk over grass and pick up twigs with his cane with confidence.    Time 2   Period Weeks   Status New   PT SHORT TERM GOAL #4   Title Pt to be able to verbalize the importance of posture in maintaining balance.    Time 2   Period Weeks           PT Long Term Goals - 05/27/15 1204    PT LONG TERM GOAL #1   Title Pt leg strength to be 5/5 to be able to go up and down steps in a reciprocal manner holding onto the handrail.    Time 4   Period Weeks   Status New   PT LONG TERM GOAL #2   Title Pt to be completing an advanced HEP in order to obtain his goals of not falling.    Time 4   Period Weeks   PT LONG TERM GOAL #3   Title Pt Berg score to have improved to at least a 50/54 to allow pt to ambulate without an assistive device.    Time 4   Period Weeks   PT LONG TERM GOAL #4   Title Pt to report that he has not had any falls in the past three weeks    Time 4   Period Weeks   Status New   PT LONG TERM GOAL #5   Title Pt to report that he has been able to ambulate over grass and pick up twigs and branches without an assistive device with confidence.    Time  4   Period Weeks               Plan - 05/27/15 1149    Clinical Impression Statement [name] is a 82 yo male who has been falling for several months but has noticed that the falls are becoming more frequent in nature, therefore he went to his MD who has referred him to skilled physical therapy.  He was diagnosed with lung cancer four years ago and went through radication therapy.  At this time his treatments are completed and he is being checked on a yearly basis.  He has also had CABG in 1995.  He was placed on a heart monitor for a month to rule out any cardiac issues for the falls.  Examination demonstrates, postural  dysfunction, decreased LE strength,  and decreased balance.  [name] will benefit from skilled physcial therapy to address these issues and decrease his falls to improve his safety.  [name] has a cane but does not use it.  A BERG test was completed demonstrating that [name] fall risk would be decreased  if he used his cane.  The therapist educated [name] that he should be using a cane at all times; [name] verbalized understanding .     Pt will benefit from skilled therapeutic intervention in order to improve on the following deficits Abnormal gait;Decreased activity tolerance;Decreased balance;Decreased range of motion;Decreased strength;Difficulty walking;Impaired flexibility;Postural dysfunction   Rehab Potential Good   PT Frequency 2x / week   PT Duration 4 weeks   PT Treatment/Interventions Gait training;Stair training;Functional mobility training;Therapeutic activities;Therapeutic exercise;Balance training;Neuromuscular re-education;Patient/family education;DME Instruction   PT Next Visit Plan Begin scapular retraction, hamstring stretching and 3-D hip excursions to address postural deficits.  Begin heel raises, rockerboard, functional squats, tandem stance, Single leg stance, and marching progress to cone rotation, balance beam, retro walking as needed.    PT Home  Exercise Plan given    Consulted and Agree with Plan of Care Patient          G-Codes - Jun 18, 2015 1210    Functional Assessment Tool Used foto   Functional Limitation Mobility: Walking and moving around   Mobility: Walking and Moving Around Current Status [phone]) At least 40 percent but less than 60 percent impaired, limited or restricted   Mobility: Walking and Moving Around Goal Status [phone]) At least 40 percent but less than 60 percent impaired, limited or restricted       Problem List Patient Active Problem List   Diagnosis Date Noted  . Diastolic CHF, chronic ([place]) 05/10/2015  . Dizziness   . Coronary artery disease involving coronary bypass graft of native heart without angina pectoris   . Near syncope 04/02/2015  . Diarrhea 04/02/2015  . COPD exacerbation ([place]) 06/04/2013  . SOB (shortness of breath) 06/04/2013  . Acute respiratory failure with hypoxia ([place]) 06/04/2013  . Hemoptysis 08/10/2012  . Prediabetes 08/28/2011  . Lung cancer ([place]) 08/25/2011  . COPD (chronic obstructive pulmonary disease) ([place]) 08/25/2011  . CKD (chronic kidney disease), stage III 12/07/2010  . Chest pain 08/18/2010  . Syncope   . Tobacco abuse, in remission   . Degenerative joint disease of knee, left   . COLONIC POLYPS 05/20/2009  . Hyperlipidemia 05/20/2009  . ANEMIA 05/20/2009  . Essential tremor 05/20/2009  . ATHEROSCLEROTIC CARDIOVASCULAR DISEASE 05/20/2009  . CEREBROVASCULAR DISEASE 05/20/2009  . Essential hypertension 01/14/2009    Rayetta Humphrey, PT CLT [phone] Jun 18, 2015, 12:12 PM  [facility] [address] [place], [place], [address] Phone: [phone]   Fax:  [phone]  Name: [name] MRN: [id number] Date of Birth: 06-20-1921

## 2015-05-27 NOTE — Patient Instructions (Signed)
Functional Quadriceps: Sit to Stand    Sit on edge of chair, feet flat on floor. Stand upright, extending knees fully. Repeat _10___ times per set. Do ___1 sets per session. Do __2__ sessions per day.  http://orth.exer.us/734   Copyright  VHI. All rights reserved.  Bridging    Slowly raise buttocks from floor, keeping stomach tight. Repeat 10____ times per set. Do _1___ sets per session. Do __2__ sessions per day.  http://orth.exer.us/1096   Copyright  VHI. All rights reserved.  Strengthening: Hip Abduction (Side-Lying)    Tighten muscles on front of left thigh, then lift leg _12__ inches from surface, keeping knee locked.  Repeat _10___ times per set. Do __1__ sets per session. Do _2___ sessions per day.  http://orth.exer.us/622   Copyright  VHI. All rights reserved.

## 2015-05-28 ENCOUNTER — Other Ambulatory Visit: Payer: Self-pay | Admitting: Family Medicine

## 2015-05-29 ENCOUNTER — Telehealth: Payer: Self-pay | Admitting: Family Medicine

## 2015-05-29 ENCOUNTER — Ambulatory Visit (HOSPITAL_COMMUNITY): Payer: Medicare Other | Admitting: Physical Therapy

## 2015-05-29 DIAGNOSIS — R296 Repeated falls: Secondary | ICD-10-CM

## 2015-05-29 DIAGNOSIS — R269 Unspecified abnormalities of gait and mobility: Secondary | ICD-10-CM

## 2015-05-29 DIAGNOSIS — R29898 Other symptoms and signs involving the musculoskeletal system: Secondary | ICD-10-CM

## 2015-05-29 DIAGNOSIS — R2689 Other abnormalities of gait and mobility: Secondary | ICD-10-CM

## 2015-05-29 MED ORDER — TAMSULOSIN HCL 0.4 MG PO CAPS: 0.4000 mg | ORAL_CAPSULE | Freq: Every day | ORAL | Status: DC

## 2015-05-29 NOTE — Telephone Encounter (Signed)
Rx sent electronically to pharmacy as a 90 day supply. Patient notified.

## 2015-05-29 NOTE — Therapy (Signed)
[place], [place], [address] Phone: [phone]   Fax:  [phone]  Physical Therapy Treatment  Patient Details  Name: [name] MRN: [id number] Date of Birth: 1921-09-12 Referring Provider: Sallee Lange   Encounter Date: 05/29/2015      PT End of Session - 05/29/15 1345    Visit Number 2   Number of Visits 8   Date for PT Re-Evaluation 06/26/15   Authorization Type Medicare   PT Start Time 1021   PT Stop Time 1059   PT Time Calculation (min) 38 min   Equipment Utilized During Treatment Gait belt   Activity Tolerance Patient tolerated treatment well   Behavior During Therapy [facility] for tasks assessed/performed      Past Medical History  Diagnosis Date  . ASCVD (arteriosclerotic cardiovascular disease)      CABG in 04/1993; and negative stress nuclear study in 08/2001  . Hyperlipidemia   . Syncope   . Hypertension   . Tobacco abuse, in remission     40 pack years; discontinued in 1980  . Cerebrovascular disease     Right carotid bruit-40-69% left internal carotid artery stenosis in 4/06; followed [facility]  . Chronic kidney disease, stage II (mild)     Creatinine-1.6 in 09/2008  . Degenerative joint disease of knee, left   . Normocytic anemia   . Pulmonary disease   . Cancer ([place])     skin  . Lung cancer ([place]) 2013  . Prediabetes   . CAD (coronary artery disease)   . COPD (chronic obstructive pulmonary disease) ([place])   . Benign essential tremor   . Horseshoe kidney   . Renal insufficiency     Past Surgical History  Procedure Laterality Date  . Coronary artery bypass graft  1995  . Colonoscopy w/ polypectomy  1985  . Pilonidal cyst excision  1948  . Lesion excision    . Cardiac surgery      There were no vitals filed for this visit.  Visit Diagnosis:  Recurrent falls  Bilateral leg weakness  Abnormality of gait  Unstable balance      Subjective Assessment - 05/29/15 1033    Subjective Pt states that he completed his exercises and has no questions.    Pertinent History COPD, OA Bilateral knees, CABG 1995;    Currently in Pain? No/denies                  [facility] Adult PT Treatment/Exercise - 05/29/15 0001    Exercises   Exercises Lumbar   Lumbar Exercises: Stretches   Active Hamstring Stretch 3 reps;30 seconds   Standing Extension 5 reps   Lumbar Exercises: Standing   Other Standing Lumbar Exercises scapular retraction x 10              Balance Exercises - 05/29/15 1045    Balance Exercises: Standing   Tandem Stance Eyes closed;2 reps;20 secs   Rockerboard Lateral;Other time (comment)  2 minutes.   Tandem Gait Forward;2 reps   Retro Gait 2 reps   Marching Limitations 10   Heel Raises Limitations 10   Toe Raise Limitations 10           PT Education - 05/29/15 1344    Education provided Yes   Education Details additional HEP    Person(s) Educated Patient   Comprehension Verbalized understanding          PT Short Term Goals - 05/27/15 1159  PT SHORT TERM GOAL #1   Title Pt will improve bilateral hip strength to 4+/5 to be able to come from sit to stand from a car without difficulty.    Time 2   Period Weeks   Status New   PT SHORT TERM GOAL #2   Title Pt to be performing a HEP to obtain his goals.   Time 2   Period Weeks   Status New   PT SHORT TERM GOAL #3   Title Pt to report that he is able to walk over grass and pick up twigs with his cane with confidence.    Time 2   Period Weeks   Status New   PT SHORT TERM GOAL #4   Title Pt to be able to verbalize the importance of posture in maintaining balance.    Time 2   Period Weeks           PT Long Term Goals - 05/27/15 1204    PT LONG TERM GOAL #1   Title Pt leg strength to be 5/5 to be able to go up and down steps in a reciprocal manner holding onto the handrail.    Time 4   Period Weeks   Status New   PT LONG TERM GOAL #2   Title Pt to be completing  an advanced HEP in order to obtain his goals of not falling.    Time 4   Period Weeks   PT LONG TERM GOAL #3   Title Pt Berg score to have improved to at least a 50/54 to allow pt to ambulate without an assistive device.    Time 4   Period Weeks   PT LONG TERM GOAL #4   Title Pt to report that he has not had any falls in the past three weeks    Time 4   Period Weeks   Status New   PT LONG TERM GOAL #5   Title Pt to report that he has been able to ambulate over grass and pick up twigs and branches without an assistive device with confidence.    Time 4   Period Weeks               Plan - 05/29/15 1345    Clinical Impression Statement Pt instructed in new balance activities which pt was able to perform with minimal cuing for proper technique but contact guard for safety.  Began pt on stretching exercises to improve posture for overall improved balance.  PT to department without using his cane.  Therapist once again reminded pt that the results of his BERG show that the patient would be safer walking with a cane.    PT Next Visit Plan Single leg stance, cone rotation on foam and begin tandem gt on foam.         Problem List Patient Active Problem List   Diagnosis Date Noted  . Diastolic CHF, chronic ([place]) 05/10/2015  . Dizziness   . Coronary artery disease involving coronary bypass graft of native heart without angina pectoris   . Near syncope 04/02/2015  . Diarrhea 04/02/2015  . COPD exacerbation ([place]) 06/04/2013  . SOB (shortness of breath) 06/04/2013  . Acute respiratory failure with hypoxia ([place]) 06/04/2013  . Hemoptysis 08/10/2012  . Prediabetes 08/28/2011  . Lung cancer ([place]) 08/25/2011  . COPD (chronic obstructive pulmonary disease) ([place]) 08/25/2011  . CKD (chronic kidney disease), stage III 12/07/2010  . Chest pain 08/18/2010  . Syncope   .  Tobacco abuse, in remission   . Degenerative joint disease of knee, left   . COLONIC POLYPS 05/20/2009  .  Hyperlipidemia 05/20/2009  . ANEMIA 05/20/2009  . Essential tremor 05/20/2009  . ATHEROSCLEROTIC CARDIOVASCULAR DISEASE 05/20/2009  . CEREBROVASCULAR DISEASE 05/20/2009  . Essential hypertension 01/14/2009  Rayetta Humphrey, PT CLT [phone] 05/29/2015, 1:49 PM  [facility] [address] [place], [place], [address] Phone: [phone]   Fax:  [phone]  Name: [name] MRN: [id number] Date of Birth: 01-26-1922

## 2015-05-29 NOTE — Patient Instructions (Signed)
Stretching: Hamstring (Supine)    Supporting right thigh behind knee, slowly straighten knee until stretch is felt in back of thigh. Hold __30__ seconds. Repeat ___3_ times per set. Do ___1_ sets per session. Do _2___ sessions per day.  http://orth.exer.us/656   Copyright  VHI. All rights reserved.  Scapular Retraction (Standing)    With arms at sides, pinch shoulder blades together. Repeat __10__ times per set. Do __1__ sets per session. Do ___2_ sessions per day.  http://orth.exer.us/944   Copyright  VHI. All rights reserved.  Backward Bend (Standing)    Arch backward to make hollow of back deeper. Hold 2____ seconds. Repeat 10____ times per set. Do _1___ sets per session. Do _2___ sessions per day.  http://orth.exer.us/178   Copyright  VHI. All rights reserved.  Heel Raise: Bilateral (Standing)    Rise on balls of feet. 2___ sessions per day.  http://orth.exer.us/38   Copyright  VHI. All rights reserved.

## 2015-05-29 NOTE — Telephone Encounter (Signed)
Pt was only sent a 30 day supply on his tamsulosin (FLOMAX) 0.4 MG CAPS capsule. Pt is charged the same amount whether he gets 30 or 90. Pt wants to know if you can resend the prescription to reflect the 90 day supply instead of the 30 that was originally ordered.     [facility]

## 2015-05-31 ENCOUNTER — Ambulatory Visit (HOSPITAL_COMMUNITY): Payer: Medicare Other

## 2015-06-03 ENCOUNTER — Ambulatory Visit (HOSPITAL_COMMUNITY): Payer: Medicare Other | Admitting: Physical Therapy

## 2015-06-05 ENCOUNTER — Ambulatory Visit (HOSPITAL_COMMUNITY): Payer: Medicare Other | Admitting: Physical Therapy

## 2015-06-05 DIAGNOSIS — R269 Unspecified abnormalities of gait and mobility: Secondary | ICD-10-CM

## 2015-06-05 DIAGNOSIS — R2689 Other abnormalities of gait and mobility: Secondary | ICD-10-CM

## 2015-06-05 DIAGNOSIS — R29898 Other symptoms and signs involving the musculoskeletal system: Secondary | ICD-10-CM

## 2015-06-05 DIAGNOSIS — R296 Repeated falls: Secondary | ICD-10-CM | POA: Diagnosis not present

## 2015-06-05 NOTE — Therapy (Signed)
[place], [place], [address] Phone: [phone]   Fax:  [phone]  Physical Therapy Treatment  Patient Details  Name: [name] MRN: [id number] Date of Birth: [birth year] Referring Provider: Sallee Lange   Encounter Date: 06/05/2015      PT End of Session - 06/05/15 1148    Visit Number 3   Number of Visits 8   Date for PT Re-Evaluation 06/26/15   Authorization Type Medicare   PT Start Time 1105   PT Stop Time 1147   PT Time Calculation (min) 42 min   Equipment Utilized During Treatment Gait belt   Activity Tolerance Patient tolerated treatment well   Behavior During Therapy [facility] for tasks assessed/performed      Past Medical History  Diagnosis Date  . ASCVD (arteriosclerotic cardiovascular disease)      CABG in 04/1993; and negative stress nuclear study in 08/2001  . Hyperlipidemia   . Syncope   . Hypertension   . Tobacco abuse, in remission     40 pack years; discontinued in 1980  . Cerebrovascular disease     Right carotid bruit-40-69% left internal carotid artery stenosis in 4/06; followed [facility]  . Chronic kidney disease, stage II (mild)     Creatinine-1.6 in 09/2008  . Degenerative joint disease of knee, left   . Normocytic anemia   . Pulmonary disease   . Cancer ([place])     skin  . Lung cancer ([place]) 2013  . Prediabetes   . CAD (coronary artery disease)   . COPD (chronic obstructive pulmonary disease) ([place])   . Benign essential tremor   . Horseshoe kidney   . Renal insufficiency     Past Surgical History  Procedure Laterality Date  . Coronary artery bypass graft  1995  . Colonoscopy w/ polypectomy  1985  . Pilonidal cyst excision  1948  . Lesion excision    . Cardiac surgery      There were no vitals filed for this visit.  Visit Diagnosis:  Recurrent falls  Bilateral leg weakness  Abnormality of gait  Unstable balance      Subjective Assessment - 06/05/15 1126    Subjective Pt states he is going to the fitness center so he has not been doing the exercieses given him.    Currently in Pain? No/denies               Balance Exercises - 06/05/15 1132    Balance Exercises: Standing   Standing Eyes Opened Narrow base of support (BOS);Solid surface;5 reps  bilateral Upper extremity flexion    Tandem Stance Eyes closed;2 reps;30 secs   SLS Eyes open;5 reps;10 secs   Balance Beam tandem x2 ; over 4' hurdles x 2 Repetitions.   Sidestepping Foam/compliant support;2 reps;(on balance beam)   Marching Limitations 10   Heel Raises Limitations 10   Toe Raise Limitations 10             PT Short Term Goals - 06/05/15 1623    PT SHORT TERM GOAL #1   Title Pt will improve bilateral hip strength to 4+/5 to be able to come from sit to stand from a car without difficulty.    Time 2   Period Weeks   Status On-going   PT SHORT TERM GOAL #2   Title Pt to be performing a HEP to obtain his goals.   Time 2   Period Weeks   Status On-going  PT SHORT TERM GOAL #3   Title Pt to report that he is able to walk over grass and pick up twigs with his cane with confidence.    Time 2   Period Weeks   Status On-going   PT SHORT TERM GOAL #4   Title Pt to be able to verbalize the importance of posture in maintaining balance.    Time 2   Period Weeks   Status On-going           PT Long Term Goals - 06/05/15 1624    PT LONG TERM GOAL #1   Title Pt leg strength to be 5/5 to be able to go up and down steps in a reciprocal manner holding onto the handrail.    Time 4   Period Weeks   Status On-going   PT LONG TERM GOAL #2   Title Pt to be completing an advanced HEP in order to obtain his goals of not falling.    Time 4   Period Weeks   Status On-going   PT LONG TERM GOAL #3   Title Pt Berg score to have improved to at least a 50/54 to allow pt to ambulate without an assistive device.    Time 4   Period Weeks   Status On-going   PT LONG TERM GOAL #4    Title Pt to report that he has not had any falls in the past three weeks    Time 4   Period Weeks   Status On-going   PT LONG TERM GOAL #5   Title Pt to report that he has been able to ambulate over grass and pick up twigs and branches without an assistive device with confidence.    Time 4   Period Weeks   Status On-going               Plan - 06/05/15 1621    Clinical Impression Statement Therapist increased difficulty of balance activity by adding foam and hurdles.  Pt needed minimal assist for safety during new exercises.  Pt treatment will continue to focus on balance as pt is going to the gym on a regular basis to work on his [place].     PT Next Visit Plan begin placing one foot on 6" step with head turns.         Problem List Patient Active Problem List   Diagnosis Date Noted  . Diastolic CHF, chronic ([place]) 05/10/2015  . Dizziness   . Coronary artery disease involving coronary bypass graft of native heart without angina pectoris   . Near syncope 04/02/2015  . Diarrhea 04/02/2015  . COPD exacerbation ([place]) 06/04/2013  . SOB (shortness of breath) 06/04/2013  . Acute respiratory failure with hypoxia ([place]) 06/04/2013  . Hemoptysis 08/10/2012  . Prediabetes 08/28/2011  . Lung cancer ([place]) 08/25/2011  . COPD (chronic obstructive pulmonary disease) ([place]) 08/25/2011  . CKD (chronic kidney disease), stage III 12/07/2010  . Chest pain 08/18/2010  . Syncope   . Tobacco abuse, in remission   . Degenerative joint disease of knee, left   . COLONIC POLYPS 05/20/2009  . Hyperlipidemia 05/20/2009  . ANEMIA 05/20/2009  . Essential tremor 05/20/2009  . ATHEROSCLEROTIC CARDIOVASCULAR DISEASE 05/20/2009  . CEREBROVASCULAR DISEASE 05/20/2009  . Essential hypertension 01/14/2009   Rayetta Humphrey, PT CLT [phone] 06/05/2015, 4:25 PM  [facility] [address] [place], [place], [address] Phone: [phone]   Fax:   [phone]  Name:  [name] MRN: [id number] Date of Birth: 02-06-1922

## 2015-06-06 ENCOUNTER — Ambulatory Visit (HOSPITAL_COMMUNITY): Payer: Medicare Other | Admitting: Physical Therapy

## 2015-06-06 DIAGNOSIS — R296 Repeated falls: Secondary | ICD-10-CM

## 2015-06-06 DIAGNOSIS — R29898 Other symptoms and signs involving the musculoskeletal system: Secondary | ICD-10-CM

## 2015-06-06 DIAGNOSIS — R269 Unspecified abnormalities of gait and mobility: Secondary | ICD-10-CM

## 2015-06-06 DIAGNOSIS — R2689 Other abnormalities of gait and mobility: Secondary | ICD-10-CM

## 2015-06-06 NOTE — Therapy (Signed)
[place], [place], [address] Phone: [phone]   Fax:  [phone]  Physical Therapy Treatment  Patient Details  Name: [name] MRN: [id number] Date of Birth: 04-02-1922 Referring Provider: Sallee Lange   Encounter Date: 06/06/2015      PT End of Session - 06/06/15 1430    Visit Number 5   Number of Visits 8   Date for PT Re-Evaluation 06/26/15   Authorization Type Medicare   Authorization - Visit Number 5   Authorization - Number of Visits 8   PT Start Time 1350   PT Stop Time 1428   PT Time Calculation (min) 38 min   Equipment Utilized During Treatment Gait belt   Activity Tolerance Patient tolerated treatment well   Behavior During Therapy [facility] for tasks assessed/performed      Past Medical History  Diagnosis Date  . ASCVD (arteriosclerotic cardiovascular disease)      CABG in 04/1993; and negative stress nuclear study in 08/2001  . Hyperlipidemia   . Syncope   . Hypertension   . Tobacco abuse, in remission     40 pack years; discontinued in 1980  . Cerebrovascular disease     Right carotid bruit-40-69% left internal carotid artery stenosis in 4/06; followed [facility]  . Chronic kidney disease, stage II (mild)     Creatinine-1.6 in 09/2008  . Degenerative joint disease of knee, left   . Normocytic anemia   . Pulmonary disease   . Cancer ([place])     skin  . Lung cancer ([place]) 2013  . Prediabetes   . CAD (coronary artery disease)   . COPD (chronic obstructive pulmonary disease) ([place])   . Benign essential tremor   . Horseshoe kidney   . Renal insufficiency     Past Surgical History  Procedure Laterality Date  . Coronary artery bypass graft  1995  . Colonoscopy w/ polypectomy  1985  . Pilonidal cyst excision  1948  . Lesion excision    . Cardiac surgery      There were no vitals filed for this visit.  Visit Diagnosis:  Recurrent falls  Bilateral leg weakness  Abnormality of  gait  Unstable balance      Subjective Assessment - 06/06/15 1402    Subjective Pt states that he was worn out with the workout yesterday    Currently in Pain? No/denies                Balance Exercises - 06/06/15 1402    Balance Exercises: Standing   Standing Eyes Opened Narrow base of support (BOS);Foam/compliant surface;5 reps  bilateral Upper extremity flexion    Tandem Stance Eyes closed;2 reps;30 secs   SLS with Vectors Solid surface;3 reps;Other reps (comment)  5"    Balance Beam cone figure 8 obstacle course with balance beams and hurdles x 2   Retro Gait 2 reps   Marching Limitations 10   Heel Raises Limitations 10   Toe Raise Limitations 10   Other Standing Exercises heel taps with altenate arm raise.     standing with one foot on 6" step with head turns x 10 both Rt and Lt foot          PT Short Term Goals - 06/05/15 1623    PT SHORT TERM GOAL #1   Title Pt will improve bilateral hip strength to 4+/5 to be able to come from sit to stand from a car without difficulty.  Time 2   Period Weeks   Status On-going   PT SHORT TERM GOAL #2   Title Pt to be performing a HEP to obtain his goals.   Time 2   Period Weeks   Status On-going   PT SHORT TERM GOAL #3   Title Pt to report that he is able to walk over grass and pick up twigs with his cane with confidence.    Time 2   Period Weeks   Status On-going   PT SHORT TERM GOAL #4   Title Pt to be able to verbalize the importance of posture in maintaining balance.    Time 2   Period Weeks   Status On-going           PT Long Term Goals - 06/05/15 1624    PT LONG TERM GOAL #1   Title Pt leg strength to be 5/5 to be able to go up and down steps in a reciprocal manner holding onto the handrail.    Time 4   Period Weeks   Status On-going   PT LONG TERM GOAL #2   Title Pt to be completing an advanced HEP in order to obtain his goals of not falling.    Time 4   Period Weeks   Status On-going    PT LONG TERM GOAL #3   Title Pt Berg score to have improved to at least a 50/54 to allow pt to ambulate without an assistive device.    Time 4   Period Weeks   Status On-going   PT LONG TERM GOAL #4   Title Pt to report that he has not had any falls in the past three weeks    Time 4   Period Weeks   Status On-going   PT LONG TERM GOAL #5   Title Pt to report that he has been able to ambulate over grass and pick up twigs and branches without an assistive device with confidence.    Time 4   Period Weeks   Status On-going               Plan - 06/06/15 1423    Clinical Impression Statement Added obstacle course to pt which was challenging needing [place] assist for safety.  Pt had difficulty initiating  heel taps with alternating arm raises needing constant verbal cuing.     PT Next Visit Plan begin wall bumps with shoulders and hips         Problem List Patient Active Problem List   Diagnosis Date Noted  . Diastolic CHF, chronic ([place]) 05/10/2015  . Dizziness   . Coronary artery disease involving coronary bypass graft of native heart without angina pectoris   . Near syncope 04/02/2015  . Diarrhea 04/02/2015  . COPD exacerbation ([place]) 06/04/2013  . SOB (shortness of breath) 06/04/2013  . Acute respiratory failure with hypoxia ([place]) 06/04/2013  . Hemoptysis 08/10/2012  . Prediabetes 08/28/2011  . Lung cancer ([place]) 08/25/2011  . COPD (chronic obstructive pulmonary disease) ([place]) 08/25/2011  . CKD (chronic kidney disease), stage III 12/07/2010  . Chest pain 08/18/2010  . Syncope   . Tobacco abuse, in remission   . Degenerative joint disease of knee, left   . COLONIC POLYPS 05/20/2009  . Hyperlipidemia 05/20/2009  . ANEMIA 05/20/2009  . Essential tremor 05/20/2009  . ATHEROSCLEROTIC CARDIOVASCULAR DISEASE 05/20/2009  . CEREBROVASCULAR DISEASE 05/20/2009  . Essential hypertension 01/14/2009   Rayetta Humphrey, PT CLT [phone] 06/06/2015, 2:31 PM  [place], [place], [address] Phone: [phone]   Fax:  [phone]  Name: ASHUTOSH DIEGUEZ MRN: [id number] Date of Birth: 1921/09/18

## 2015-06-10 ENCOUNTER — Ambulatory Visit (HOSPITAL_COMMUNITY): Payer: Medicare Other | Admitting: Physical Therapy

## 2015-06-10 DIAGNOSIS — R296 Repeated falls: Secondary | ICD-10-CM

## 2015-06-10 DIAGNOSIS — R269 Unspecified abnormalities of gait and mobility: Secondary | ICD-10-CM

## 2015-06-10 DIAGNOSIS — R2689 Other abnormalities of gait and mobility: Secondary | ICD-10-CM

## 2015-06-10 DIAGNOSIS — R29898 Other symptoms and signs involving the musculoskeletal system: Secondary | ICD-10-CM

## 2015-06-10 NOTE — Therapy (Signed)
[place], [place], [address] Phone: [phone]   Fax:  [phone]  Physical Therapy Treatment  Patient Details  Name: [name] MRN: [id number] Date of Birth: 1921-08-12 Referring Provider: Sallee Lange   Encounter Date: 06/10/2015      PT End of Session - 06/10/15 1026    Visit Number 6   Number of Visits 8   Date for PT Re-Evaluation 06/26/15   Authorization Type Medicare   Authorization - Visit Number 5   Authorization - Number of Visits 8   PT Start Time 0848   PT Stop Time 0928   PT Time Calculation (min) 40 min   Equipment Utilized During Treatment Gait belt   Activity Tolerance Patient tolerated treatment well   Behavior During Therapy [facility] for tasks assessed/performed      Past Medical History  Diagnosis Date  . ASCVD (arteriosclerotic cardiovascular disease)      CABG in 04/1993; and negative stress nuclear study in 08/2001  . Hyperlipidemia   . Syncope   . Hypertension   . Tobacco abuse, in remission     40 pack years; discontinued in 1980  . Cerebrovascular disease     Right carotid bruit-40-69% left internal carotid artery stenosis in 4/06; followed [facility]  . Chronic kidney disease, stage II (mild)     Creatinine-1.6 in 09/2008  . Degenerative joint disease of knee, left   . Normocytic anemia   . Pulmonary disease   . Cancer ([place])     skin  . Lung cancer ([place]) 2013  . Prediabetes   . CAD (coronary artery disease)   . COPD (chronic obstructive pulmonary disease) ([place])   . Benign essential tremor   . Horseshoe kidney   . Renal insufficiency     Past Surgical History  Procedure Laterality Date  . Coronary artery bypass graft  1995  . Colonoscopy w/ polypectomy  1985  . Pilonidal cyst excision  1948  . Lesion excision    . Cardiac surgery      There were no vitals filed for this visit.  Visit Diagnosis:  Recurrent falls  Bilateral leg weakness  Abnormality of  gait  Unstable balance      Subjective Assessment - 06/10/15 0851    Subjective Doing pretty good, haven't noticed any changes really. No falls since starting PT.    How long can you stand comfortably? 10-20 minutes   How long can you walk comfortably? 15-20 minutes                         [facility] Adult PT Treatment/Exercise - 06/10/15 0001    Dynamic Standing Balance   Forward lean/weight shifting comments: reaching for cones on plinth, even stance and staggered stance bilaterally, max excursion   Reaching for objects comments: rotation reaching for ball, staggered stance Rt then Lt forward   Eyes closed comments: static standing staggered stance   Turn head to look over shoulder comments: staggered stance and on 6 inch step   Dynamic Standing - Comments Heel-toe static and dynamic, min assist needed for balance.   Knee/Hip Exercises: Aerobic   Nustep L1 X5 min   Knee/Hip Exercises: Standing   Hip Flexion Stengthening;Both;1 set;10 reps;Other (comment)  single/no UE support   Hip Abduction Stengthening;Both;1 set;10 reps;Other (comment)  single UE support                PT Education -  06/10/15 1025    Education provided Yes   Education Details discussing interaction between balance, strength and fatigue and effects on ADLs and stability.   Person(s) Educated Patient;Child(ren)   Methods Explanation   Comprehension Verbalized understanding          PT Short Term Goals - 06/05/15 1623    PT SHORT TERM GOAL #1   Title Pt will improve bilateral hip strength to 4+/5 to be able to come from sit to stand from a car without difficulty.    Time 2   Period Weeks   Status On-going   PT SHORT TERM GOAL #2   Title Pt to be performing a HEP to obtain his goals.   Time 2   Period Weeks   Status On-going   PT SHORT TERM GOAL #3   Title Pt to report that he is able to walk over grass and pick up twigs with his cane with confidence.    Time 2   Period Weeks    Status On-going   PT SHORT TERM GOAL #4   Title Pt to be able to verbalize the importance of posture in maintaining balance.    Time 2   Period Weeks   Status On-going           PT Long Term Goals - 06/05/15 1624    PT LONG TERM GOAL #1   Title Pt leg strength to be 5/5 to be able to go up and down steps in a reciprocal manner holding onto the handrail.    Time 4   Period Weeks   Status On-going   PT LONG TERM GOAL #2   Title Pt to be completing an advanced HEP in order to obtain his goals of not falling.    Time 4   Period Weeks   Status On-going   PT LONG TERM GOAL #3   Title Pt Berg score to have improved to at least a 50/54 to allow pt to ambulate without an assistive device.    Time 4   Period Weeks   Status On-going   PT LONG TERM GOAL #4   Title Pt to report that he has not had any falls in the past three weeks    Time 4   Period Weeks   Status On-going   PT LONG TERM GOAL #5   Title Pt to report that he has been able to ambulate over grass and pick up twigs and branches without an assistive device with confidence.    Time 4   Period Weeks   Status On-going               Plan - 06/10/15 1026    Clinical Impression Statement Patient with decreasing stability with increasing fatigue. May benefit from combination of balance and strength/endurance activities. Noted decreased stability with narrowing base of support. Patient's son present for session.    PT Next Visit Plan begin wall bumps with shoulders and hips, review HEP   PT Home Exercise Plan review HEP   Consulted and Agree with Plan of Care Patient        Problem List Patient Active Problem List   Diagnosis Date Noted  . Diastolic CHF, chronic ([place]) 05/10/2015  . Dizziness   . Coronary artery disease involving coronary bypass graft of native heart without angina pectoris   . Near syncope 04/02/2015  . Diarrhea 04/02/2015  . COPD exacerbation ([place]) 06/04/2013  . SOB (shortness of breath)  06/04/2013  .  Acute respiratory failure with hypoxia ([place]) 06/04/2013  . Hemoptysis 08/10/2012  . Prediabetes 08/28/2011  . Lung cancer ([place]) 08/25/2011  . COPD (chronic obstructive pulmonary disease) ([place]) 08/25/2011  . CKD (chronic kidney disease), stage III 12/07/2010  . Chest pain 08/18/2010  . Syncope   . Tobacco abuse, in remission   . Degenerative joint disease of knee, left   . COLONIC POLYPS 05/20/2009  . Hyperlipidemia 05/20/2009  . ANEMIA 05/20/2009  . Essential tremor 05/20/2009  . ATHEROSCLEROTIC CARDIOVASCULAR DISEASE 05/20/2009  . CEREBROVASCULAR DISEASE 05/20/2009  . Essential hypertension 01/14/2009    Cassell Clement, PT, CSCS Pager [phone]  06/10/2015, 10:31 AM  [place], [place], [address] Phone: [phone]   Fax:  [phone]  Name: [name] MRN: [id number] Date of Birth: 1922/01/11

## 2015-06-12 ENCOUNTER — Ambulatory Visit (HOSPITAL_COMMUNITY): Payer: Medicare Other | Attending: Family Medicine

## 2015-06-12 DIAGNOSIS — R29898 Other symptoms and signs involving the musculoskeletal system: Secondary | ICD-10-CM | POA: Diagnosis present

## 2015-06-12 DIAGNOSIS — R2689 Other abnormalities of gait and mobility: Secondary | ICD-10-CM

## 2015-06-12 DIAGNOSIS — R269 Unspecified abnormalities of gait and mobility: Secondary | ICD-10-CM | POA: Insufficient documentation

## 2015-06-12 DIAGNOSIS — R296 Repeated falls: Secondary | ICD-10-CM | POA: Insufficient documentation

## 2015-06-12 NOTE — Therapy (Signed)
[place], [place], [address] Phone: [phone]   Fax:  [phone]  Physical Therapy Treatment  Patient Details  Name: [name] MRN: [id number] Date of Birth: 02/08/1922 Referring Provider: Sallee Lange   Encounter Date: 06/12/2015      PT End of Session - 06/12/15 0851    Visit Number 7   Number of Visits 8   Date for PT Re-Evaluation 06/26/15   Authorization Type Medicare   Authorization - Visit Number 7   Authorization - Number of Visits 8   PT Start Time 1324   PT Stop Time 0932   PT Time Calculation (min) 45 min   Equipment Utilized During Treatment Gait belt   Activity Tolerance Patient tolerated treatment well   Behavior During Therapy [facility] for tasks assessed/performed      Past Medical History  Diagnosis Date  . ASCVD (arteriosclerotic cardiovascular disease)      CABG in 04/1993; and negative stress nuclear study in 08/2001  . Hyperlipidemia   . Syncope   . Hypertension   . Tobacco abuse, in remission     40 pack years; discontinued in 1980  . Cerebrovascular disease     Right carotid bruit-40-69% left internal carotid artery stenosis in 4/06; followed [facility]  . Chronic kidney disease, stage II (mild)     Creatinine-1.6 in 09/2008  . Degenerative joint disease of knee, left   . Normocytic anemia   . Pulmonary disease   . Cancer ([place])     skin  . Lung cancer ([place]) 2013  . Prediabetes   . CAD (coronary artery disease)   . COPD (chronic obstructive pulmonary disease) ([place])   . Benign essential tremor   . Horseshoe kidney   . Renal insufficiency     Past Surgical History  Procedure Laterality Date  . Coronary artery bypass graft  1995  . Colonoscopy w/ polypectomy  1985  . Pilonidal cyst excision  1948  . Lesion excision    . Cardiac surgery      There were no vitals filed for this visit.  Visit Diagnosis:  Recurrent falls  Bilateral leg weakness  Abnormality of  gait  Unstable balance      Subjective Assessment - 06/12/15 0850    Subjective Feeling good today.  Reports no falls in last month or two.  Reports compliance with gym family fitness at least 3x a week   Pertinent History COPD, OA Bilateral knees, CABG 1995;    Patient Stated Goals to decrease the number of falls    Currently in Pain? No/denies                         [facility] Adult PT Treatment/Exercise - 06/12/15 0001    Lumbar Exercises: Seated   Sit to Stand 10 reps  no HHA   Knee/Hip Exercises: Aerobic   Nustep L2 X5 min             Balance Exercises - 06/12/15 0914    Balance Exercises: Standing   SLS with Vectors Solid surface;3 reps;Other reps (comment)   Wall Bumps Shoulder;Hip   Wall Bumps-Shoulders Eyes opened;20 reps   Wall Bumps-Hips 20 reps   Balance Beam cone figure 8 obstacle course with balance beams and hurdles x 2   Retro Gait 2 reps   Marching Limitations 2 sets x 10 reps with opposite arm raise   Heel Raises Limitations 15  Toe Raise Limitations 15   Other Standing Exercises Toe tapping 6in step with opposite arm raise 2 sets x 10 reps             PT Short Term Goals - 06/05/15 1623    PT SHORT TERM GOAL #1   Title Pt will improve bilateral hip strength to 4+/5 to be able to come from sit to stand from a car without difficulty.    Time 2   Period Weeks   Status On-going   PT SHORT TERM GOAL #2   Title Pt to be performing a HEP to obtain his goals.   Time 2   Period Weeks   Status On-going   PT SHORT TERM GOAL #3   Title Pt to report that he is able to walk over grass and pick up twigs with his cane with confidence.    Time 2   Period Weeks   Status On-going   PT SHORT TERM GOAL #4   Title Pt to be able to verbalize the importance of posture in maintaining balance.    Time 2   Period Weeks   Status On-going           PT Long Term Goals - 06/05/15 1624    PT LONG TERM GOAL #1   Title Pt leg strength to be  5/5 to be able to go up and down steps in a reciprocal manner holding onto the handrail.    Time 4   Period Weeks   Status On-going   PT LONG TERM GOAL #2   Title Pt to be completing an advanced HEP in order to obtain his goals of not falling.    Time 4   Period Weeks   Status On-going   PT LONG TERM GOAL #3   Title Pt Berg score to have improved to at least a 50/54 to allow pt to ambulate without an assistive device.    Time 4   Period Weeks   Status On-going   PT LONG TERM GOAL #4   Title Pt to report that he has not had any falls in the past three weeks    Time 4   Period Weeks   Status On-going   PT LONG TERM GOAL #5   Title Pt to report that he has been able to ambulate over grass and pick up twigs and branches without an assistive device with confidence.    Time 4   Period Weeks   Status On-going               Plan - 06/12/15 1221    Clinical Impression Statement Continued session focus on improving balance training.  Added wall bumps for core activation with balance activities to assist with posterior leans to decreased falls.  Pt continues to require assistance with single leg based activities to reduce risk of falls.  Pt limited by fatigue through session requiried 3-4 seated rest breaks.  No reports of increased pain through session.  Ended session on Nustep for activity tolerance and strengthening.     PT Next Visit Plan Reassess next session        Problem List Patient Active Problem List   Diagnosis Date Noted  . Diastolic CHF, chronic ([place]) 05/10/2015  . Dizziness   . Coronary artery disease involving coronary bypass graft of native heart without angina pectoris   . Near syncope 04/02/2015  . Diarrhea 04/02/2015  . COPD exacerbation ([place]) 06/04/2013  . SOB (shortness  of breath) 06/04/2013  . Acute respiratory failure with hypoxia ([place]) 06/04/2013  . Hemoptysis 08/10/2012  . Prediabetes 08/28/2011  . Lung cancer ([place]) 08/25/2011  . COPD (chronic  obstructive pulmonary disease) ([place]) 08/25/2011  . CKD (chronic kidney disease), stage III 12/07/2010  . Chest pain 08/18/2010  . Syncope   . Tobacco abuse, in remission   . Degenerative joint disease of knee, left   . COLONIC POLYPS 05/20/2009  . Hyperlipidemia 05/20/2009  . ANEMIA 05/20/2009  . Essential tremor 05/20/2009  . ATHEROSCLEROTIC CARDIOVASCULAR DISEASE 05/20/2009  . CEREBROVASCULAR DISEASE 05/20/2009  . Essential hypertension 01/14/2009   Ihor Austin, [place]; [place]  Aldona Lento 06/12/2015, 12:27 PM  [facility] [address] [place], [place], [address] Phone: [phone]   Fax:  [phone]  Name: [name] MRN: [id number] Date of Birth: [birth year]

## 2015-06-17 ENCOUNTER — Ambulatory Visit (HOSPITAL_COMMUNITY): Payer: Medicare Other | Admitting: Physical Therapy

## 2015-06-17 DIAGNOSIS — R296 Repeated falls: Secondary | ICD-10-CM | POA: Diagnosis not present

## 2015-06-17 DIAGNOSIS — R29898 Other symptoms and signs involving the musculoskeletal system: Secondary | ICD-10-CM

## 2015-06-17 DIAGNOSIS — R2689 Other abnormalities of gait and mobility: Secondary | ICD-10-CM

## 2015-06-17 DIAGNOSIS — R269 Unspecified abnormalities of gait and mobility: Secondary | ICD-10-CM

## 2015-06-17 NOTE — Patient Instructions (Signed)
On Elbows (Prone)    Rise up on elbows as high as possible, keeping hips on floor. Hold __300__ seconds. Repeat ____ times per set. Do _1_ sets per session. Do ___2_ sessions per day. 1 http://orth.exer.us/92   Copyright  VHI. All rights reserved.  Strengthening: Hip Extension (Prone)    Tighten muscles on front of left thigh, then lift leg __3__ inches from surface, keeping knee locked. Repeat __10__ times per set. Do __1__ sets per session. Do 2____ sessions per day. 1http://orth.exer.us/620   Copyright  VHI. All rights reserved.

## 2015-06-17 NOTE — Therapy (Signed)
[place], [place], [address] Phone: [phone]   Fax:  [phone]  Physical Therapy Treatment  Patient Details  Name: [name] MRN: [id number] Date of Birth: Oct 02, 1921 Referring Provider: Sallee Lange   Encounter Date: 06/17/2015      PT End of Session - 06/17/15 1212    Visit Number 8   Number of Visits 8   Date for PT Re-Evaluation 06/26/15   Authorization Type Medicare   Authorization - Visit Number 8   Authorization - Number of Visits 8   PT Start Time 0930   PT Stop Time 7591   PT Time Calculation (min) 44 min   Activity Tolerance Patient tolerated treatment well   Behavior During Therapy [facility] for tasks assessed/performed      Past Medical History  Diagnosis Date  . ASCVD (arteriosclerotic cardiovascular disease)      CABG in 04/1993; and negative stress nuclear study in 08/2001  . Hyperlipidemia   . Syncope   . Hypertension   . Tobacco abuse, in remission     40 pack years; discontinued in 1980  . Cerebrovascular disease     Right carotid bruit-40-69% left internal carotid artery stenosis in 4/06; followed [facility]  . Chronic kidney disease, stage II (mild)     Creatinine-1.6 in 09/2008  . Degenerative joint disease of knee, left   . Normocytic anemia   . Pulmonary disease   . Cancer ([place])     skin  . Lung cancer ([place]) 2013  . Prediabetes   . CAD (coronary artery disease)   . COPD (chronic obstructive pulmonary disease) ([place])   . Benign essential tremor   . Horseshoe kidney   . Renal insufficiency     Past Surgical History  Procedure Laterality Date  . Coronary artery bypass graft  1995  . Colonoscopy w/ polypectomy  1985  . Pilonidal cyst excision  1948  . Lesion excision    . Cardiac surgery      There were no vitals filed for this visit.  Visit Diagnosis:  Recurrent falls  Bilateral leg weakness  Abnormality of gait  Unstable balance      Subjective Assessment -  06/17/15 0934    Subjective Pt states that he can not tell a lot of difference since he started therapy.      Pertinent History COPD, OA Bilateral knees, CABG 1995;    How long can you sit comfortably? no problem    How long can you stand comfortably? able to stand for 20 minutes was 10-20 minutes    How long can you walk comfortably? able to walk for 20 minutes was 15-20 minutes    Patient Stated Goals to decrease the number of falls ( pt stubbed toe and fell on Friday but prior to that his last fall was a before therapy started.    Currently in Pain? No/denies            [facility] PT Assessment - 06/17/15 0001    Assessment   Medical Diagnosis abnormal gait   Referring Provider Nicki Reaper [place]    Onset Date/Surgical Date 02/13/15   Next MD Visit 08/25/2017   Prior Therapy none   Precautions   Precautions Fall   Restrictions   Weight Bearing Restrictions No   Balance Screen   Has the patient fallen in the past 6 months Yes   How many times? 5   Has the patient had a decrease in  activity level because of a fear of falling?  No   Is the patient reluctant to leave their home because of a fear of falling?  No   Home Environment   Living Environment Private residence   Living Arrangements Spouse/significant other   Type of Home House   Home Access Stairs to enter   Entrance Stairs-Number of Steps 4   Entrance Stairs-Rails Right   Home Layout Two level   Alternate Level Stairs-Number of Steps 15   Prior Function   Level of Independence Independent   Vocation Retired   Leisure Family fitness,    Cognition   Overall Cognitive Status Within Functional Limits for tasks assessed   Observation/Other Assessments   Focus on Therapeutic Outcomes (FOTO)  63  was 53    Functional Tests   Functional tests Single leg stance;Sit to Stand   Sit to Stand   Comments 5 in 11.13 was 21.47 seconds    Posture/Postural Control   Posture/Postural Control Postural limitations   Postural Limitations  Rounded Shoulders;Forward head;Decreased lumbar lordosis;Increased thoracic kyphosis   Strength   Right Hip Flexion 5/5   Right Hip Extension 4-/5   Left Hip Flexion 5/5   Left Hip Extension 4-/5   Right Knee Extension 5/5   Left Knee Extension 5/5   Standardized Balance Assessment   Standardized Balance Assessment Berg Balance Test   Berg Balance Test   Sit to Stand Able to stand without using hands and stabilize independently   Standing Unsupported Able to stand safely 2 minutes   Sitting with Back Unsupported but Feet Supported on Floor or Stool Able to sit safely and securely 2 minutes   Stand to Sit Sits safely with minimal use of hands   Transfers Able to transfer safely, minor use of hands   Standing Unsupported with Eyes Closed Able to stand 10 seconds safely   Standing Ubsupported with Feet Together Able to place feet together independently and stand 1 minute safely   From Standing, Reach Forward with Outstretched Arm Can reach forward >12 cm safely (5")   From Standing Position, Pick up Object from Floor Able to pick up shoe safely and easily   From Standing Position, Turn to Look Behind Over each Shoulder Looks behind one side only/other side shows less weight shift   Turn 360 Degrees Able to turn 360 degrees safely in 4 seconds or less   Standing Unsupported, Alternately Place Feet on Step/Stool Able to stand independently and safely and complete 8 steps in 20 seconds   Standing Unsupported, One Foot in Front Able to plae foot ahead of the other independently and hold 30 seconds   Standing on One Leg Able to lift leg independently and hold equal to or more than 3 seconds   Total Score 51 was 46                     [facility] Adult PT Treatment/Exercise - 06/17/15 0001    Lumbar Exercises: Prone   Straight Leg Raise 10 reps   Knee/Hip Exercises: Aerobic   Nustep L2 X10 min   Knee/Hip Exercises: Standing   Stairs x2 RT    SLS x3                  PT  Short Term Goals - 06/17/15 0959    PT SHORT TERM GOAL #1   Title Pt will improve bilateral hip strength to 4+/5 to be able to come from sit to   stand from a car without difficulty.    Baseline Hip strength is the same but pt states that he feels getting in and out of the car is easier.    Period Weeks   Status Partially Met   PT SHORT TERM GOAL #2   Title Pt to be performing a HEP to obtain his goals.   Time 2   Period Weeks   Status Partially Met   PT SHORT TERM GOAL #3   Title Pt to report that he is able to walk over grass and pick up twigs with his cane with confidence.    Time 2   Period Weeks   Status Achieved   PT SHORT TERM GOAL #4   Title Pt to be able to verbalize the importance of posture in maintaining balance.    Time 2   Period Weeks   Status Partially Met           PT Long Term Goals - 06/17/15 1002    PT LONG TERM GOAL #1   Title Pt leg strength to be 5/5 to be able to go up and down steps in a reciprocal manner holding onto the handrail.    Time 4   Status Partially Met   PT LONG TERM GOAL #2   Title Pt to be completing an advanced HEP in order to obtain his goals of not falling.    Time 4   Period Weeks   Status Partially Met   PT LONG TERM GOAL #3   Title Pt Berg score to have improved to at least a 50/54 to allow pt to ambulate without an assistive device.    Time 4   Period Weeks   Status Achieved   PT LONG TERM GOAL #4   Title Pt to report that he has not had any falls in the past three weeks    Baseline one fall when he stubbed his toe    Time 4   Period Weeks   Status Partially Met   PT LONG TERM GOAL #5   Title Pt to report that he has been able to ambulate over grass and pick up twigs and branches without an assistive device with confidence.    Time 4   Period Weeks   Status Achieved               Plan - 06/17/15 1208    Clinical Impression Statement Pt has not improved in strength but has shown significant improvement in  balance.  Pt verbalized that he is not completing HEP as he is going to the gym and feels that this is enough.  Therapist explatined that the home exercise program targets areas that were shown to have deficits during the evaluation and that he is probably not targeting these muscles when he goes to work out in the gym.  Pt encouraged to complete his HEP even after discharge .   PT Next Visit Plan discharge to HEP           G-Codes - 06/17/15 1018    Functional Assessment Tool Used foto   Functional Limitation Mobility: Walking and moving around   Mobility: Walking and Moving Around Goal Status (G8979) At least 40 percent but less than 60 percent impaired, limited or restricted   Mobility: Walking and Moving Around Discharge Status (G8980) At least 20 percent but less than 40 percent impaired, limited or restricted      Problem List Patient Active Problem List     Diagnosis Date Noted  . Diastolic CHF, chronic ([facility]) 05/10/2015  . Dizziness   . Coronary artery disease involving coronary bypass graft of native heart without angina pectoris   . Near syncope 04/02/2015  . Diarrhea 04/02/2015  . COPD exacerbation ([facility]) 06/04/2013  . SOB (shortness of breath) 06/04/2013  . Acute respiratory failure with hypoxia ([facility]) 06/04/2013  . Hemoptysis 08/10/2012  . Prediabetes 08/28/2011  . Lung cancer ([facility]) 08/25/2011  . COPD (chronic obstructive pulmonary disease) ([facility]) 08/25/2011  . CKD (chronic kidney disease), stage III 12/07/2010  . Chest pain 08/18/2010  . Syncope   . Tobacco abuse, in remission   . Degenerative joint disease of knee, left   . COLONIC POLYPS 05/20/2009  . Hyperlipidemia 05/20/2009  . ANEMIA 05/20/2009  . Essential tremor 05/20/2009  . ATHEROSCLEROTIC CARDIOVASCULAR DISEASE 05/20/2009  . CEREBROVASCULAR DISEASE 05/20/2009  . Essential hypertension 01/14/2009   Cynthia Russell, PT CLT [phone] 06/17/2015, 12:13 PM  [facility] [address] [place], [place], [address] Phone: [phone]   Fax:  [phone]  Name: [name] MRN: [id number] Date of Birth: 01/11/1922  PHYSICAL THERAPY DISCHARGE SUMMARY  Visits from Start of Care: 8  Current functional level related to goals / functional outcomes: See above   Remaining deficits: See above   Education / Equipment: HEP  Plan: Patient agrees to discharge.  Patient goals were partially met. Patient is being discharged due to being pleased with the current functional level.  ?????    Cynthia Russell, PT CLT [phone]     

## 2015-06-19 ENCOUNTER — Encounter (HOSPITAL_COMMUNITY): Payer: Medicare Other

## 2015-06-24 ENCOUNTER — Encounter (HOSPITAL_COMMUNITY): Payer: Medicare Other | Admitting: Physical Therapy

## 2015-06-26 ENCOUNTER — Encounter (HOSPITAL_COMMUNITY): Payer: Medicare Other

## 2015-07-01 ENCOUNTER — Ambulatory Visit (HOSPITAL_COMMUNITY): Payer: Medicare Other | Admitting: Physical Therapy

## 2015-08-14 ENCOUNTER — Ambulatory Visit (INDEPENDENT_AMBULATORY_CARE_PROVIDER_SITE_OTHER): Payer: Medicare Other | Admitting: Family Medicine

## 2015-08-14 ENCOUNTER — Telehealth: Payer: Self-pay | Admitting: Family Medicine

## 2015-08-14 ENCOUNTER — Encounter: Payer: Self-pay | Admitting: Family Medicine

## 2015-08-14 VITALS — BP 110/80 | Temp 98.1°F | Ht 67.0 in | Wt 146.0 lb

## 2015-08-14 DIAGNOSIS — R634 Abnormal weight loss: Secondary | ICD-10-CM | POA: Diagnosis not present

## 2015-08-14 DIAGNOSIS — R197 Diarrhea, unspecified: Secondary | ICD-10-CM

## 2015-08-14 NOTE — Progress Notes (Signed)
   Subjective:    Patient ID: [name], male    DOB: Jun 07, 1921, 82 y.o.   MRN: [id number]  Diarrhea  This is a new problem. The current episode started more than 1 month ago. The problem has been unchanged. Nothing aggravates the symptoms. There are no known risk factors. He has tried nothing for the symptoms. The treatment provided no relief.   Patient has no other concerns at this time.  Diarrhea is been present over the past couple weeks off and on for a few months has foul odor to it but no excessive mucus or blood noted  Review of Systems  Gastrointestinal: Positive for diarrhea.  No bloody stools no vomiting. No fever chills no abdominal pain just has a lot of gurgling in his abdomen has seen gastroenterology before for that.     Objective:   Physical Exam  Lungs clear hearts regular abdomen soft no guarding rebound tenderness minimal edema in the legs      Assessment & Plan:  Diarrhea and nonspecific lab work indicated some weight loss noted as well but the patient doesn't eat quite as much is use to this could be normal for age we will go ahead and do lab work and stool studies wait the results of this patient has follow-up later this month he may use Imodium when necessary

## 2015-08-14 NOTE — Telephone Encounter (Signed)
error 

## 2015-08-19 LAB — CBC WITH DIFFERENTIAL/PLATELET
BASOS ABS: 0 10*3/uL (ref 0.0–0.2)
Basos: 1 %
EOS (ABSOLUTE): 0.2 10*3/uL (ref 0.0–0.4)
Eos: 3 %
Hematocrit: 33.5 % — ABNORMAL LOW (ref 37.5–51.0)
Hemoglobin: 11 g/dL — ABNORMAL LOW (ref 12.6–17.7)
IMMATURE GRANS (ABS): 0 10*3/uL (ref 0.0–0.1)
Immature Granulocytes: 0 %
LYMPHS ABS: 1.7 10*3/uL (ref 0.7–3.1)
LYMPHS: 23 %
MCH: 30.3 pg (ref 26.6–33.0)
MCHC: 32.8 g/dL (ref 31.5–35.7)
MCV: 92 fL (ref 79–97)
MONOCYTES: 12 %
Monocytes Absolute: 0.9 10*3/uL (ref 0.1–0.9)
Neutrophils Absolute: 4.6 10*3/uL (ref 1.4–7.0)
Neutrophils: 61 %
Platelets: 233 10*3/uL (ref 150–379)
RBC: 3.63 x10E6/uL — AB (ref 4.14–5.80)
RDW: 13.8 % (ref 12.3–15.4)
WBC: 7.4 10*3/uL (ref 3.4–10.8)

## 2015-08-19 LAB — BASIC METABOLIC PANEL
BUN / CREAT RATIO: 15 (ref 10–24)
BUN: 32 mg/dL (ref 10–36)
CALCIUM: 9.1 mg/dL (ref 8.6–10.2)
CO2: 24 mmol/L (ref 18–29)
Chloride: 101 mmol/L (ref 96–106)
Creatinine, Ser: 2.1 mg/dL — ABNORMAL HIGH (ref 0.76–1.27)
GFR, EST AFRICAN AMERICAN: 31 mL/min/{1.73_m2} — AB (ref >59)
GFR, EST NON AFRICAN AMERICAN: 27 mL/min/{1.73_m2} — AB (ref >59)
Glucose: 100 mg/dL — ABNORMAL HIGH (ref 65–99)
POTASSIUM: 5 mmol/L (ref 3.5–5.2)
Sodium: 142 mmol/L (ref 134–144)

## 2015-08-19 LAB — TISSUE TRANSGLUTAMINASE, IGA: Transglutaminase IgA: <2 U/mL (ref 0–3)

## 2015-08-19 LAB — SEDIMENTATION RATE: Sed Rate: 12 mm/hr (ref 0–30)

## 2015-09-03 ENCOUNTER — Telehealth: Payer: Self-pay | Admitting: Family Medicine

## 2015-09-03 NOTE — Telephone Encounter (Signed)
Pt was recently seen at the podiatrist for gout and was prescribed methylprednisolone '4mg'$ . Son is wanting to know if it is ok for him to take with all of his other medications. Please advise.

## 2015-09-03 NOTE — Telephone Encounter (Signed)
Discussed with son-son advised Short-term use of prednisone over a 5-10 days and would be fine long-term use less likely to be fine. Son states it was a 6 day course and patient has follow up with Dr Nicki Reaper 09/06/15.

## 2015-09-03 NOTE — Telephone Encounter (Signed)
Short-term use of prednisone over a 5-10 days and would be fine long-term use less likely to be fine

## 2015-09-04 LAB — CLOSTRIDIUM DIFFICILE BY PCR: CDIFFPCR: NEGATIVE

## 2015-09-06 ENCOUNTER — Ambulatory Visit (INDEPENDENT_AMBULATORY_CARE_PROVIDER_SITE_OTHER): Payer: Medicare Other | Admitting: Family Medicine

## 2015-09-06 ENCOUNTER — Encounter: Payer: Self-pay | Admitting: Family Medicine

## 2015-09-06 VITALS — BP 122/54 | Ht 67.0 in | Wt 146.2 lb

## 2015-09-06 DIAGNOSIS — C3400 Malignant neoplasm of unspecified main bronchus: Secondary | ICD-10-CM | POA: Diagnosis not present

## 2015-09-06 DIAGNOSIS — J438 Other emphysema: Secondary | ICD-10-CM

## 2015-09-06 DIAGNOSIS — R634 Abnormal weight loss: Secondary | ICD-10-CM

## 2015-09-06 DIAGNOSIS — I10 Essential (primary) hypertension: Secondary | ICD-10-CM | POA: Diagnosis not present

## 2015-09-06 LAB — OVA AND PARASITE EXAMINATION

## 2015-09-06 NOTE — Progress Notes (Signed)
   Subjective:    Patient ID: [name], male    DOB: 04/02/1922, 82 y.o.   MRN: [id number]  Hypertension This is a chronic problem. The current episode started more than 1 month ago. There are no compliance problems.    Patient in today to discuss recent labs and to discuss recent diagnoses of gout. This patient states he had foot pain discomfort swelled some he went to see his podiatrist he was told he has gout. They recommend prednisone for the next several days. He does state he eats a relatively healthy diet. He denies any chest tightness pressure pain His COPD is stable does get some shortness of breath but uses his inhaler Patient has been losing weight despite trying not to lose weight but he does not eat supper he typically eats some breath is some lunch but no supper he will eat ice cream at night. He does have a history of lung cancer he relates some coughing but denies any hemoptysis. Denies rectal bleeding. Denies abdominal pain.  Review of Systems Denies headaches night sweats fever chills hemoptysis vomiting diarrhea he was being evaluated for the possibility of C. difficile versus bacterial overgrowth but his stool studies came back looking good    Objective:   Physical Exam Lungs are clear hearts regular Abdomen soft no guarding rebound extremities no edema Patient with essential tremor not worse     Assessment & Plan:  Loss await-this is concerning for reoccurrence of lung cancer I recommend seen the specialist at [facility] center we will try to talk with them. Our goal is to get his appointment moved up and CAT scan moved up patient will follow-up with [place] in 3 months improve dietary intake way patient weekly if having significant drop in weight follow-up sooner Dr Shirlee Limerick  Blood pressure stable continue current measures Essential tremor stable on beta blocker COPD stable on medications Diarrhea resolved

## 2015-09-08 ENCOUNTER — Encounter (HOSPITAL_COMMUNITY): Payer: Self-pay | Admitting: *Deleted

## 2015-09-08 ENCOUNTER — Emergency Department (HOSPITAL_COMMUNITY)
Admission: EM | Admit: 2015-09-08 | Discharge: 2015-09-08 | Disposition: A | Discharge status: Home / Self Care | Payer: Medicare Other | Attending: Emergency Medicine | Admitting: Emergency Medicine

## 2015-09-08 DIAGNOSIS — N289 Disorder of kidney and ureter, unspecified: Secondary | ICD-10-CM | POA: Insufficient documentation

## 2015-09-08 DIAGNOSIS — R112 Nausea with vomiting, unspecified: Secondary | ICD-10-CM | POA: Diagnosis not present

## 2015-09-08 DIAGNOSIS — J449 Chronic obstructive pulmonary disease, unspecified: Secondary | ICD-10-CM | POA: Insufficient documentation

## 2015-09-08 DIAGNOSIS — Z87891 Personal history of nicotine dependence: Secondary | ICD-10-CM | POA: Diagnosis not present

## 2015-09-08 DIAGNOSIS — I129 Hypertensive chronic kidney disease with stage 1 through stage 4 chronic kidney disease, or unspecified chronic kidney disease: Secondary | ICD-10-CM | POA: Diagnosis not present

## 2015-09-08 DIAGNOSIS — E785 Hyperlipidemia, unspecified: Secondary | ICD-10-CM | POA: Insufficient documentation

## 2015-09-08 DIAGNOSIS — N182 Chronic kidney disease, stage 2 (mild): Secondary | ICD-10-CM | POA: Insufficient documentation

## 2015-09-08 DIAGNOSIS — D649 Anemia, unspecified: Secondary | ICD-10-CM | POA: Diagnosis not present

## 2015-09-08 DIAGNOSIS — Z79899 Other long term (current) drug therapy: Secondary | ICD-10-CM | POA: Insufficient documentation

## 2015-09-08 DIAGNOSIS — Z7982 Long term (current) use of aspirin: Secondary | ICD-10-CM | POA: Insufficient documentation

## 2015-09-08 DIAGNOSIS — I2510 Atherosclerotic heart disease of native coronary artery without angina pectoris: Secondary | ICD-10-CM | POA: Insufficient documentation

## 2015-09-08 DIAGNOSIS — R030 Elevated blood-pressure reading, without diagnosis of hypertension: Secondary | ICD-10-CM | POA: Diagnosis not present

## 2015-09-08 LAB — DIFFERENTIAL
Basophils Absolute: 0 10*3/uL (ref 0.0–0.1)
Basophils Relative: 0 %
EOS ABS: 0 10*3/uL (ref 0.0–0.7)
EOS PCT: 0 %
LYMPHS ABS: 1.3 10*3/uL (ref 0.7–4.0)
Lymphocytes Relative: 15 %
MONO ABS: 0.9 10*3/uL (ref 0.1–1.0)
Monocytes Relative: 10 %
NEUTROS PCT: 75 %
Neutro Abs: 6.2 10*3/uL (ref 1.7–7.7)

## 2015-09-08 LAB — COMPREHENSIVE METABOLIC PANEL
ALBUMIN: 3.8 g/dL (ref 3.5–5.0)
ALK PHOS: 78 U/L (ref 38–126)
ALT: 17 U/L (ref 17–63)
AST: 21 U/L (ref 15–41)
Anion gap: 7 (ref 5–15)
BUN: 60 mg/dL — ABNORMAL HIGH (ref 6–20)
CALCIUM: 8.9 mg/dL (ref 8.9–10.3)
CHLORIDE: 107 mmol/L (ref 101–111)
CO2: 27 mmol/L (ref 22–32)
CREATININE: 2.15 mg/dL — AB (ref 0.61–1.24)
GFR calc Af Amer: 29 mL/min — ABNORMAL LOW (ref >60)
GFR calc non Af Amer: 25 mL/min — ABNORMAL LOW (ref >60)
GLUCOSE: 168 mg/dL — AB (ref 65–99)
Potassium: 3.7 mmol/L (ref 3.5–5.1)
SODIUM: 141 mmol/L (ref 135–145)
Total Bilirubin: 0.7 mg/dL (ref 0.3–1.2)
Total Protein: 6.5 g/dL (ref 6.5–8.1)

## 2015-09-08 LAB — CBC
HCT: 33.4 % — ABNORMAL LOW (ref 39.0–52.0)
HEMOGLOBIN: 10.7 g/dL — AB (ref 13.0–17.0)
MCH: 30.7 pg (ref 26.0–34.0)
MCHC: 32 g/dL (ref 30.0–36.0)
MCV: 96 fL (ref 78.0–100.0)
PLATELETS: 233 10*3/uL (ref 150–400)
RBC: 3.48 MIL/uL — ABNORMAL LOW (ref 4.22–5.81)
RDW: 14.7 % (ref 11.5–15.5)
WBC: 8.3 10*3/uL (ref 4.0–10.5)

## 2015-09-08 LAB — STOOL CULTURE: E COLI SHIGA TOXIN ASSAY: NEGATIVE

## 2015-09-08 LAB — LIPASE, BLOOD: Lipase: 36 U/L (ref 11–51)

## 2015-09-08 MED ORDER — DIPHENHYDRAMINE HCL 50 MG/ML IJ SOLN
25.0000 mg | Freq: Once | INTRAMUSCULAR | Status: AC
  Administered 2015-09-08: 25 mg via INTRAVENOUS
  Filled 2015-09-08: qty 1

## 2015-09-08 MED ORDER — METOCLOPRAMIDE HCL 5 MG/ML IJ SOLN
10.0000 mg | Freq: Once | INTRAMUSCULAR | Status: AC
  Administered 2015-09-08: 10 mg via INTRAVENOUS
  Filled 2015-09-08: qty 2

## 2015-09-08 MED ORDER — METOCLOPRAMIDE HCL 10 MG PO TABS: 10.0000 mg | ORAL_TABLET | Freq: Four times a day (QID) | ORAL | Status: DC | PRN

## 2015-09-08 NOTE — ED Provider Notes (Signed)
CSN: [id number]     Arrival date & time 09/08/15  0446 History   First MD Initiated Contact with Patient 09/08/15 [phone]     Chief Complaint  Patient presents with  . Emesis     (Consider location/radiation/quality/duration/timing/severity/associated sxs/prior Treatment) Patient is a 82 y.o. male presenting with vomiting. The history is provided by the patient and a relative.  Emesis He has history of hypertension, hyperlipidemia, atherosclerotic cardiovascular disease, chronic kidney disease, COPD. He complains of nausea which started this evening. He did vomit once at home and this was a fair amount of emesis. Family called for an ambulance when he vomited. He denies abdominal pain. He had diarrhea 3 days ago but none tonight. He denies fever, chills, sweats. There've been no known sick contacts. He had not eaten anything unusual. EMS gave him ondansetron without any relief. He vomited on arrival to the ED.  Past Medical History  Diagnosis Date  . ASCVD (arteriosclerotic cardiovascular disease)      CABG in 04/1993; and negative stress nuclear study in 08/2001  . Hyperlipidemia   . Syncope   . Hypertension   . Tobacco abuse, in remission     40 pack years; discontinued in 1980  . Cerebrovascular disease     Right carotid bruit-40-69% left internal carotid artery stenosis in 4/06; followed [facility]  . Chronic kidney disease, stage II (mild)     Creatinine-1.6 in 09/2008  . Degenerative joint disease of knee, left   . Normocytic anemia   . Pulmonary disease   . Cancer ([place])     skin  . Lung cancer ([place]) 2013  . Prediabetes   . CAD (coronary artery disease)   . COPD (chronic obstructive pulmonary disease) ([place])   . Benign essential tremor   . Horseshoe kidney   . Renal insufficiency    Past Surgical History  Procedure Laterality Date  . Coronary artery bypass graft  1995  . Colonoscopy w/ polypectomy  1985  . Pilonidal cyst excision  1948  . Lesion excision    . Cardiac surgery      Family History  Problem Relation Age of Onset  . Cancer Brother   . Heart attack Brother    Social History  Substance Use Topics  . Smoking status: Former Smoker -- 1.00 packs/day for 40 years    Types: Cigarettes    Quit date: 08/14/1973  . Smokeless tobacco: Former Systems developer    Types: [place] date: 12/05/1980  . Alcohol Use: No    Review of Systems  Gastrointestinal: Positive for vomiting.  All other systems reviewed and are negative.     Allergies  Levaquin; Penicillins; and Sulfonamide derivatives  Home Medications   Prior to Admission medications   Medication Sig Start Date End Date Taking? Authorizing Provider  albuterol (VENTOLIN HFA) 108 (90 Base) MCG/ACT inhaler INHALE 2 PUFFS INTO THE LUNGS EVERY 6 HOURS AS NEEDED FOR SHORTNESS OF BREATH 05/10/15  Yes Kathyrn Drown, MD  aspirin EC 81 MG tablet Take 81 mg by mouth daily.   Yes Historical Provider, MD  atorvastatin (LIPITOR) 40 MG tablet TAKE 1 TABLET (40 MG TOTAL) BY MOUTH DAILY. 05/10/15  Yes Kathyrn Drown, MD  budesonide-formoterol (SYMBICORT) 160-4.5 MCG/ACT inhaler INHALE 1 PUFF INTO THE LUNGS 2 (TWO) TIMES DAILY. 05/10/15  Yes Kathyrn Drown, MD  fluticasone (FLONASE) 50 MCG/ACT nasal spray Place 1 spray into the nose daily.    Yes Historical Provider, MD  methylPREDNISolone (MEDROL)  4 MG tablet Take 4 mg by mouth daily.   Yes Historical Provider, MD  metoprolol (LOPRESSOR) 50 MG tablet TAKE 1 TABLET (50 MG TOTAL) BY MOUTH DAILY. 05/10/15  Yes Kathyrn Drown, MD  Multiple Vitamin (MULTIVITAMIN WITH MINERALS) TABS Take 1 tablet by mouth daily.   Yes Historical Provider, MD  nitroGLYCERIN (NITROSTAT) 0.4 MG SL tablet Place 1 tablet (0.4 mg total) under the tongue every 5 (five) minutes as needed for chest pain. 09/19/13  Yes Kathyrn Drown, MD  potassium chloride (KLOR-CON M10) 10 MEQ tablet Take 1 tablet (10 mEq total) by mouth 2 (two) times daily. 05/10/15  Yes Kathyrn Drown, MD  simethicone (MYLICON) 80 MG  chewable tablet Chew 80 mg by mouth every 6 (six) hours as needed for flatulence.   Yes Historical Provider, MD  tamsulosin (FLOMAX) 0.4 MG CAPS capsule Take 1 capsule (0.4 mg total) by mouth at bedtime. 05/29/15  Yes Kathyrn Drown, MD  torsemide (DEMADEX) 20 MG tablet Take 1 tablet (20 mg total) by mouth every morning. 04/04/15  Yes Samuella Cota, MD   BP 138/54 mmHg  Pulse 74  Temp(Src) 97.4 F (36.3 C) (Oral)  Resp 20  Ht '5\' 7"'$  (1.702 m)  Wt 146 lb (66.225 kg)  BMI 22.86 kg/m2  SpO2 98% Physical Exam  Nursing note and vitals reviewed.  82 year old male, resting comfortably and in no acute distress. Vital signs are normal. Oxygen saturation is 98%, which is normal. Head is normocephalic and atraumatic. PERRLA, EOMI. Oropharynx is clear. Neck is nontender and supple without adenopathy or JVD. Back is nontender and there is no CVA tenderness. Lungs are clear without rales, wheezes, or rhonchi. Chest is nontender. Heart has regular rate and rhythm without murmur. Abdomen is soft, flat, nontender without masses or hepatosplenomegaly and peristalsis is hypoactive. Extremities have no cyanosis or edema, full range of motion is present. Skin is warm and dry without rash. Neurologic: Mental status is normal, cranial nerves are intact, there are no motor or sensory deficits.  ED Course  Procedures (including critical care time) Labs Review Results for orders placed or performed during the hospital encounter of 09/08/15  Lipase, blood  Result Value Ref Range   Lipase 36 11 - 51 U/L  Comprehensive metabolic panel  Result Value Ref Range   Sodium 141 135 - 145 mmol/L   Potassium 3.7 3.5 - 5.1 mmol/L   Chloride 107 101 - 111 mmol/L   CO2 27 22 - 32 mmol/L   Glucose, Bld 168 (H) 65 - 99 mg/dL   BUN 60 (H) 6 - 20 mg/dL   Creatinine, Ser 2.15 (H) 0.61 - 1.24 mg/dL   Calcium 8.9 8.9 - 10.3 mg/dL   Total Protein 6.5 6.5 - 8.1 g/dL   Albumin 3.8 3.5 - 5.0 g/dL   AST 21 15 - 41 U/L    ALT 17 17 - 63 U/L   Alkaline Phosphatase 78 38 - 126 U/L   Total Bilirubin 0.7 0.3 - 1.2 mg/dL   GFR calc non Af Amer 25 (L) >60 mL/min   GFR calc Af Amer 29 (L) >60 mL/min   Anion gap 7 5 - 15  CBC  Result Value Ref Range   WBC 8.3 4.0 - 10.5 K/uL   RBC 3.48 (L) 4.22 - 5.81 MIL/uL   Hemoglobin 10.7 (L) 13.0 - 17.0 g/dL   HCT 33.4 (L) 39.0 - 52.0 %   MCV 96.0 78.0 - 100.0 fL  MCH 30.7 26.0 - 34.0 pg   MCHC 32.0 30.0 - 36.0 g/dL   RDW 14.7 11.5 - 15.5 %   Platelets 233 150 - 400 K/uL  Differential  Result Value Ref Range   Neutrophils Relative % 75 %   Neutro Abs 6.2 1.7 - 7.7 K/uL   Lymphocytes Relative 15 %   Lymphs Abs 1.3 0.7 - 4.0 K/uL   Monocytes Relative 10 %   Monocytes Absolute 0.9 0.1 - 1.0 K/uL   Eosinophils Relative 0 %   Eosinophils Absolute 0.0 0.0 - 0.7 K/uL   Basophils Relative 0 %   Basophils Absolute 0.0 0.0 - 0.1 K/uL   I have personally reviewed and evaluated these lab results as part of my medical decision-making.   EKG Interpretation   Date/Time:  Sunday Sep 08 2015 04:53:34 EDT Ventricular Rate:  98 PR Interval:  188 QRS Duration: 152 QT Interval:  395 QTC Calculation: 504 R Axis:   -134 Text Interpretation:  Sinus rhythm Right bundle branch block When compared  with ECG of 04/02/2015, No significant change was found Confirmed by [facility]   MD, Tyreke Kaeser ([id number]) on 09/08/2015 4:57:13 AM      MDM   Final diagnoses:  Non-intractable vomiting with nausea, unspecified vomiting type  Renal insufficiency  Normochromic normocytic anemia    Nausea and vomiting causes unclear-possible viral illness, possible food poisoning. Lack of other family members with symptoms would argue against food poisoning. No red flags to suggest more serious disease. Exam is benign. He was given metoclopramide with significant improvement in nausea. He will be given an oral fluid challenge. Screening labs of been obtained. Old records are reviewed and he has been followed  in the past for problems with diarrhea, but I do not see any prior visits for vomiting.  Laboratory workup shows stable anemia and renal insufficiency. There has been modest increase in BUN compared with most recent value, but his IV hydration should help with that. Following fluids and metoclopramide, he was able to tolerate oral fluids without any vomiting. He is states he is feeling much better. He is discharged with prescription for metoclopramide. Follow-up with PCP as needed.  Delora Fuel, MD 39/53/20 [id number]

## 2015-09-08 NOTE — Discharge Instructions (Signed)
Nausea and Vomiting °Nausea is a sick feeling that often comes before throwing up (vomiting). Vomiting is a reflex where stomach contents come out of your mouth. Vomiting can cause severe loss of body fluids (dehydration). Children and elderly adults can become dehydrated quickly, especially if they also have diarrhea. Nausea and vomiting are symptoms of a condition or disease. It is important to find the cause of your symptoms. °CAUSES  °· Direct irritation of the stomach lining. This irritation can result from increased acid production (gastroesophageal reflux disease), infection, food poisoning, taking certain medicines (such as nonsteroidal anti-inflammatory drugs), alcohol use, or tobacco use. °· Signals from the brain. These signals could be caused by a headache, heat exposure, an inner ear disturbance, increased pressure in the brain from injury, infection, a tumor, or a concussion, pain, emotional stimulus, or metabolic problems. °· An obstruction in the gastrointestinal tract (bowel obstruction). °· Illnesses such as diabetes, hepatitis, gallbladder problems, appendicitis, kidney problems, cancer, sepsis, atypical symptoms of a heart attack, or eating disorders. °· Medical treatments such as chemotherapy and radiation. °· Receiving medicine that makes you sleep (general anesthetic) during surgery. °DIAGNOSIS °Your caregiver may ask for tests to be done if the problems do not improve after a few days. Tests may also be done if symptoms are severe or if the reason for the nausea and vomiting is not clear. Tests may include: °· Urine tests. °· Blood tests. °· Stool tests. °· Cultures (to look for evidence of infection). °· X-rays or other imaging studies. °Test results can help your caregiver make decisions about treatment or the need for additional tests. °TREATMENT °You need to stay well hydrated. Drink frequently but in small amounts. You may wish to drink water, sports drinks, clear broth, or eat frozen  ice pops or gelatin dessert to help stay hydrated. When you eat, eating slowly may help prevent nausea. There are also some antinausea medicines that may help prevent nausea. °HOME CARE INSTRUCTIONS  °· Take all medicine as directed by your caregiver. °· If you do not have an appetite, do not force yourself to eat. However, you must continue to drink fluids. °· If you have an appetite, eat a normal diet unless your caregiver tells you differently. °¨ Eat a variety of complex carbohydrates (rice, wheat, potatoes, bread), lean meats, yogurt, fruits, and vegetables. °¨ Avoid high-fat foods because they are more difficult to digest. °· Drink enough water and fluids to keep your urine clear or pale yellow. °· If you are dehydrated, ask your caregiver for specific rehydration instructions. Signs of dehydration may include: °¨ Severe thirst. °¨ Dry lips and mouth. °¨ Dizziness. °¨ Dark urine. °¨ Decreasing urine frequency and amount. °¨ Confusion. °¨ Rapid breathing or pulse. °SEEK IMMEDIATE MEDICAL CARE IF:  °· You have blood or brown flecks (like coffee grounds) in your vomit. °· You have black or bloody stools. °· You have a severe headache or stiff neck. °· You are confused. °· You have severe abdominal pain. °· You have chest pain or trouble breathing. °· You do not urinate at least once every 8 hours. °· You develop cold or clammy skin. °· You continue to vomit for longer than 24 to 48 hours. °· You have a fever. °MAKE SURE YOU:  °· Understand these instructions. °· Will watch your condition. °· Will get help right away if you are not doing well or get worse. °  °This information is not intended to replace advice given to you by your health care provider. Make sure   you discuss any questions you have with your health care provider.   Document Released: 03/30/2005 Document Revised: 06/22/2011 Document Reviewed: 08/27/2010 Elsevier Interactive Patient Education 2016 [employer].  Metoclopramide tablets What is  this medicine? METOCLOPRAMIDE (met oh kloe PRA mide) is used to treat the symptoms of gastroesophageal reflux disease (GERD) like heartburn. It is also used to treat people with slow emptying of the stomach and intestinal tract. This medicine may be used for other purposes; ask your health care provider or pharmacist if you have questions. What should I tell my health care provider before I take this medicine? They need to know if you have any of these conditions: -breast cancer -depression -diabetes -heart failure -high blood pressure -kidney disease -liver disease -Parkinson's disease or a movement disorder -pheochromocytoma -seizures -stomach obstruction, bleeding, or perforation -an unusual or allergic reaction to metoclopramide, procainamide, sulfites, other medicines, foods, dyes, or preservatives -pregnant or trying to get pregnant -breast-feeding How should I use this medicine? Take this medicine by mouth with a glass of water. Follow the directions on the prescription label. Take this medicine on an empty stomach, about 30 minutes before eating. Take your doses at regular intervals. Do not take your medicine more often than directed. Do not stop taking except on the advice of your doctor or health care professional. A special MedGuide will be given to you by the pharmacist with each prescription and refill. Be sure to read this information carefully each time. Talk to your pediatrician regarding the use of this medicine in children. Special care may be needed. Overdosage: If you think you have taken too much of this medicine contact a poison control center or emergency room at once. NOTE: This medicine is only for you. Do not share this medicine with others. What if I miss a dose? If you miss a dose, take it as soon as you can. If it is almost time for your next dose, take only that dose. Do not take double or extra doses. What may interact with this  medicine? -acetaminophen -cyclosporine -digoxin -medicines for blood pressure -medicines for diabetes, including insulin -medicines for hay fever and other allergies -medicines for depression, especially an Monoamine Oxidase Inhibitor (MAOI) -medicines for Parkinson's disease, like levodopa -medicines for sleep or for pain -tetracycline This list may not describe all possible interactions. Give your health care provider a list of all the medicines, herbs, non-prescription drugs, or dietary supplements you use. Also tell them if you smoke, drink alcohol, or use illegal drugs. Some items may interact with your medicine. What should I watch for while using this medicine? It may take a few weeks for your stomach condition to start to get better. However, do not take this medicine for longer than 12 weeks. The longer you take this medicine, and the more you take it, the greater your chances are of developing serious side effects. If you are an elderly patient, a male patient, or you have diabetes, you may be at an increased risk for side effects from this medicine. Contact your doctor immediately if you start having movements you cannot control such as lip smacking, rapid movements of the tongue, involuntary or uncontrollable movements of the eyes, head, arms and legs, or muscle twitches and spasms. Patients and their families should watch out for worsening depression or thoughts of suicide. Also watch out for any sudden or severe changes in feelings such as feeling anxious, agitated, panicky, irritable, hostile, aggressive, impulsive, severely restless, overly excited and hyperactive, or  not being able to sleep. If this happens, especially at the beginning of treatment or after a change in dose, call your doctor. Do not treat yourself for high fever. Ask your doctor or health care professional for advice. You may get drowsy or dizzy. Do not drive, use machinery, or do anything that needs mental  alertness until you know how this drug affects you. Do not stand or sit up quickly, especially if you are an older patient. This reduces the risk of dizzy or fainting spells. Alcohol can make you more drowsy and dizzy. Avoid alcoholic drinks. What side effects may I notice from receiving this medicine? Side effects that you should report to your doctor or health care professional as soon as possible: -allergic reactions like skin rash, itching or hives, swelling of the face, lips, or tongue -abnormal production of milk in females -breast enlargement in both males and females -change in the way you walk -difficulty moving, speaking or swallowing -drooling, lip smacking, or rapid movements of the tongue -excessive sweating -fever -involuntary or uncontrollable movements of the eyes, head, arms and legs -irregular heartbeat or palpitations -muscle twitches and spasms -unusually weak or tired Side effects that usually do not require medical attention (report to your doctor or health care professional if they continue or are bothersome): -change in sex drive or performance -depressed mood -diarrhea -difficulty sleeping -headache -menstrual changes -restless or nervous This list may not describe all possible side effects. Call your doctor for medical advice about side effects. You may report side effects to FDA at 1-800-FDA-1088. Where should I keep my medicine? Keep out of the reach of children. Store at room temperature between 20 and 25 degrees C (68 and 77 degrees F). Protect from light. Keep container tightly closed. Throw away any unused medicine after the expiration date. NOTE: This sheet is a summary. It may not cover all possible information. If you have questions about this medicine, talk to your doctor, pharmacist, or health care provider.    2016, Elsevier/Gold Standard. (2011-07-28 13:04:38)

## 2015-09-08 NOTE — ED Notes (Signed)
PO fluids given to patient at this time. 

## 2015-09-08 NOTE — ED Notes (Signed)
Pt arrived to er by [employer] with c/o n/v that started this am, pt alert, denies any pain, was given 4 mg zofran enroute by ems,

## 2015-09-10 ENCOUNTER — Telehealth: Payer: Self-pay | Admitting: Family Medicine

## 2015-09-10 NOTE — Telephone Encounter (Signed)
Thank you, I was aware

## 2015-09-10 NOTE — Telephone Encounter (Signed)
[name]) called to let you know patient was in the hospital this weekend. He just wanted you to know.

## 2015-09-11 ENCOUNTER — Telehealth: Payer: Self-pay | Admitting: Family Medicine

## 2015-09-11 NOTE — Progress Notes (Signed)
[name] spoke with specialist. appt is in June. Pt will follow up with [name] beginning of July.

## 2015-09-11 NOTE — Progress Notes (Signed)
Left message with Dr.Yoo to call Dr.Scott back. Dr.Yoo shall be calling on line [phone]

## 2015-09-11 NOTE — Telephone Encounter (Signed)
Dr.Scott Luking spoke with Dr.David Shawna [place] Oncologist at [place]. Dr.Yoo stated that patient is scheduled for an office visit and a scan on June 15th, 2017. Dr.Scott would like a follow up office visit no later than first week of July with patient.

## 2015-09-11 NOTE — Telephone Encounter (Signed)
Discussed with pt's son. Transferred to front to scheduled office visit.

## 2015-09-11 NOTE — Telephone Encounter (Signed)
[place] 09/11/15

## 2015-09-12 ENCOUNTER — Other Ambulatory Visit: Payer: Self-pay

## 2015-09-12 DIAGNOSIS — R634 Abnormal weight loss: Secondary | ICD-10-CM

## 2015-09-12 LAB — POC HEMOCCULT BLD/STL (HOME/3-CARD/SCREEN)
Card #3 Fecal Occult Blood, POC: NEGATIVE
FECAL OCCULT BLD: NEGATIVE
Fecal Occult Blood, POC: NEGATIVE

## 2015-09-24 ENCOUNTER — Other Ambulatory Visit: Payer: Self-pay | Admitting: Family Medicine

## 2015-09-27 ENCOUNTER — Encounter (HOSPITAL_COMMUNITY): Payer: Self-pay

## 2015-09-27 ENCOUNTER — Emergency Department (HOSPITAL_COMMUNITY)
Admission: EM | Admit: 2015-09-27 | Discharge: 2015-09-27 | Disposition: A | Discharge status: Home / Self Care | Payer: Medicare Other | Attending: Emergency Medicine | Admitting: Emergency Medicine

## 2015-09-27 DIAGNOSIS — J449 Chronic obstructive pulmonary disease, unspecified: Secondary | ICD-10-CM | POA: Diagnosis not present

## 2015-09-27 DIAGNOSIS — Z79899 Other long term (current) drug therapy: Secondary | ICD-10-CM | POA: Diagnosis not present

## 2015-09-27 DIAGNOSIS — R110 Nausea: Secondary | ICD-10-CM

## 2015-09-27 DIAGNOSIS — E785 Hyperlipidemia, unspecified: Secondary | ICD-10-CM | POA: Diagnosis not present

## 2015-09-27 DIAGNOSIS — I2510 Atherosclerotic heart disease of native coronary artery without angina pectoris: Secondary | ICD-10-CM | POA: Diagnosis not present

## 2015-09-27 DIAGNOSIS — N182 Chronic kidney disease, stage 2 (mild): Secondary | ICD-10-CM | POA: Diagnosis not present

## 2015-09-27 DIAGNOSIS — R42 Dizziness and giddiness: Secondary | ICD-10-CM | POA: Diagnosis present

## 2015-09-27 DIAGNOSIS — Z7982 Long term (current) use of aspirin: Secondary | ICD-10-CM | POA: Diagnosis not present

## 2015-09-27 DIAGNOSIS — Z87891 Personal history of nicotine dependence: Secondary | ICD-10-CM | POA: Insufficient documentation

## 2015-09-27 DIAGNOSIS — I129 Hypertensive chronic kidney disease with stage 1 through stage 4 chronic kidney disease, or unspecified chronic kidney disease: Secondary | ICD-10-CM | POA: Diagnosis not present

## 2015-09-27 LAB — TROPONIN I: Troponin I: <0.03 ng/mL (ref <0.031)

## 2015-09-27 LAB — URINALYSIS, ROUTINE W REFLEX MICROSCOPIC
Bilirubin Urine: NEGATIVE
Glucose, UA: NEGATIVE mg/dL
Hgb urine dipstick: NEGATIVE
Ketones, ur: NEGATIVE mg/dL
Leukocytes, UA: NEGATIVE
Nitrite: NEGATIVE
Protein, ur: NEGATIVE mg/dL
Specific Gravity, Urine: 1.01 (ref 1.005–1.030)
pH: 6 (ref 5.0–8.0)

## 2015-09-27 LAB — CBC WITH DIFFERENTIAL/PLATELET
BASOS ABS: 0 10*3/uL (ref 0.0–0.1)
Basophils Relative: 1 %
EOS PCT: 3 %
Eosinophils Absolute: 0.2 10*3/uL (ref 0.0–0.7)
HCT: 33.2 % — ABNORMAL LOW (ref 39.0–52.0)
Hemoglobin: 10.8 g/dL — ABNORMAL LOW (ref 13.0–17.0)
LYMPHS PCT: 22 %
Lymphs Abs: 1.7 10*3/uL (ref 0.7–4.0)
MCH: 31.4 pg (ref 26.0–34.0)
MCHC: 32.5 g/dL (ref 30.0–36.0)
MCV: 96.5 fL (ref 78.0–100.0)
MONO ABS: 1 10*3/uL (ref 0.1–1.0)
Monocytes Relative: 12 %
Neutro Abs: 5 10*3/uL (ref 1.7–7.7)
Neutrophils Relative %: 62 %
PLATELETS: 188 10*3/uL (ref 150–400)
RBC: 3.44 MIL/uL — ABNORMAL LOW (ref 4.22–5.81)
RDW: 15 % (ref 11.5–15.5)
WBC: 8 10*3/uL (ref 4.0–10.5)

## 2015-09-27 LAB — BASIC METABOLIC PANEL WITH GFR
Anion gap: 7 (ref 5–15)
BUN: 45 mg/dL — ABNORMAL HIGH (ref 6–20)
CO2: 26 mmol/L (ref 22–32)
Calcium: 8.7 mg/dL — ABNORMAL LOW (ref 8.9–10.3)
Chloride: 105 mmol/L (ref 101–111)
Creatinine, Ser: 2.06 mg/dL — ABNORMAL HIGH (ref 0.61–1.24)
GFR calc Af Amer: 31 mL/min — ABNORMAL LOW (ref >60)
GFR calc non Af Amer: 27 mL/min — ABNORMAL LOW (ref >60)
Glucose, Bld: 123 mg/dL — ABNORMAL HIGH (ref 65–99)
Potassium: 4.5 mmol/L (ref 3.5–5.1)
Sodium: 138 mmol/L (ref 135–145)

## 2015-09-27 MED ORDER — SODIUM CHLORIDE 0.9 % IV BOLUS (SEPSIS)
500.0000 mL | Freq: Once | INTRAVENOUS | Status: AC
  Administered 2015-09-27: 500 mL via INTRAVENOUS

## 2015-09-27 MED ORDER — ONDANSETRON 4 MG PO TBDP: 4.0000 mg | ORAL_TABLET | Freq: Three times a day (TID) | ORAL | Status: DC | PRN

## 2015-09-27 MED ORDER — ONDANSETRON HCL 4 MG/2ML IJ SOLN
4.0000 mg | Freq: Once | INTRAMUSCULAR | Status: AC
  Administered 2015-09-27: 4 mg via INTRAVENOUS
  Filled 2015-09-27: qty 2

## 2015-09-27 NOTE — ED Notes (Signed)
Pt woke up with dizziness at 0515 this am.  BP at home 200/110. Pt denies pain.

## 2015-09-27 NOTE — Discharge Instructions (Signed)
Nausea, Adult °Nausea is the feeling that you have an upset stomach or have to vomit. Nausea by itself is not likely a serious concern, but it may be an early sign of more serious medical problems. As nausea gets worse, it can lead to vomiting. If vomiting develops, there is the risk of dehydration.  °CAUSES  °· Viral infections. °· Food poisoning. °· Medicines. °· Pregnancy. °· Motion sickness. °· Migraine headaches. °· Emotional distress. °· Severe pain from any source. °· Alcohol intoxication. °HOME CARE INSTRUCTIONS °· Get plenty of rest. °· Ask your caregiver about specific rehydration instructions. °· Eat small amounts of food and sip liquids more often. °· Take all medicines as told by your caregiver. °SEEK MEDICAL CARE IF: °· You have not improved after 2 days, or you get worse. °· You have a headache. °SEEK IMMEDIATE MEDICAL CARE IF:  °· You have a fever. °· You faint. °· You keep vomiting or have blood in your vomit. °· You are extremely weak or dehydrated. °· You have dark or bloody stools. °· You have severe chest or abdominal pain. °MAKE SURE YOU: °· Understand these instructions. °· Will watch your condition. °· Will get help right away if you are not doing well or get worse. °  °This information is not intended to replace advice given to you by your health care provider. Make sure you discuss any questions you have with your health care provider. °  °Document Released: 05/07/2004 Document Revised: 04/20/2014 Document Reviewed: 12/10/2010 °Elsevier Interactive Patient Education ©2016 Elsevier Inc. ° °

## 2015-09-27 NOTE — ED Notes (Signed)
Pt not wanting food or drink.  Pt tolerating ice chips well.

## 2015-09-27 NOTE — ED Notes (Signed)
Pt reports was feeling a little dizzy around 0900 this morning but went to the fitness center.  Reports it got worse while he was exercising and he became nauseated.  Reports a fireman at the fitness center checked his bp and said it was 200/110.  Reports pt had a ct of his lungs yesterday at [facility] because he has history of lung cancer but they don't know the results.

## 2015-09-27 NOTE — ED Notes (Signed)
Meal given patient ate 50%.

## 2015-09-27 NOTE — ED Notes (Signed)
Meal given to patient. Patient refused.

## 2015-09-27 NOTE — ED Notes (Signed)
Family at bedside. 

## 2015-09-27 NOTE — ED Provider Notes (Signed)
CSN: [id number]     Arrival date & time 09/27/15  1035 History  By signing my name below, I, Nicole Kindred, attest that this documentation has been prepared under the direction and in the presence of No att. providers found.   Electronically Signed: Nicole Kindred, ED Scribe. 09/27/2015. 9:40 PM   Chief Complaint  Patient presents with  . Dizziness   The history is provided by the patient. No language interpreter was used.   HPI Comments: NAINOA WOLDT is a 82 y.o. male with extensive PMHx as noted below who presents to the Emergency Department complaining of gradual onset, dizziness, onset this morning at 9 AM while he was working out. He states he was just starting his workout when his symptoms began. Pt did eat breakfast before working out. Pt reports associated nausea and generalized fatigue. His family states he has had a cough, beginning this week. Per nursing note, a fireman at the fitness center measured his BP at 200/110 after symptoms began. No other associated symptoms noted. No worsening or alleviating factors noted. Pt denies emesis, diarrhea, fever, chills, headache, confusion, dysuria, sore throat, rash, urinary frequency, decreased urination, or any other pertinent symptoms.  Past Medical History  Diagnosis Date  . ASCVD (arteriosclerotic cardiovascular disease)      CABG in 04/1993; and negative stress nuclear study in 08/2001  . Hyperlipidemia   . Syncope   . Hypertension   . Tobacco abuse, in remission     40 pack years; discontinued in 1980  . Cerebrovascular disease     Right carotid bruit-40-69% left internal carotid artery stenosis in 4/06; followed [facility]  . Chronic kidney disease, stage II (mild)     Creatinine-1.6 in 09/2008  . Degenerative joint disease of knee, left   . Normocytic anemia   . Pulmonary disease   . Cancer ([place])     skin  . Lung cancer ([place]) 2013  . Prediabetes   . CAD (coronary artery disease)   . COPD (chronic obstructive pulmonary  disease) ([place])   . Benign essential tremor   . Horseshoe kidney   . Renal insufficiency    Past Surgical History  Procedure Laterality Date  . Coronary artery bypass graft  1995  . Colonoscopy w/ polypectomy  1985  . Pilonidal cyst excision  1948  . Lesion excision    . Cardiac surgery     Family History  Problem Relation Age of Onset  . Cancer Brother   . Heart attack Brother    Social History  Substance Use Topics  . Smoking status: Former Smoker -- 1.00 packs/day for 40 years    Types: Cigarettes    Quit date: 08/14/1973  . Smokeless tobacco: Former Systems developer    Types: [place] date: 12/05/1980  . Alcohol Use: No    Review of Systems  Constitutional: Positive for fatigue. Negative for fever and chills.  HENT: Negative for sore throat.   Respiratory: Positive for cough.   Gastrointestinal: Positive for nausea. Negative for diarrhea.  Genitourinary: Negative for frequency and difficulty urinating.  Skin: Negative for rash.  Neurological: Positive for dizziness. Negative for headaches.  Psychiatric/Behavioral: Negative for confusion.     Allergies  Levaquin; Penicillins; and Sulfonamide derivatives  Home Medications   Prior to Admission medications   Medication Sig Start Date End Date Taking? Authorizing Provider  albuterol (VENTOLIN HFA) 108 (90 Base) MCG/ACT inhaler INHALE 2 PUFFS INTO THE LUNGS EVERY 6 HOURS AS NEEDED FOR SHORTNESS OF  BREATH 05/10/15  Yes Kathyrn Drown, MD  aspirin EC 81 MG tablet Take 81 mg by mouth daily.   Yes Historical Provider, MD  atorvastatin (LIPITOR) 40 MG tablet TAKE 1 TABLET (40 MG TOTAL) BY MOUTH DAILY. 05/10/15  Yes Kathyrn Drown, MD  budesonide-formoterol (SYMBICORT) 160-4.5 MCG/ACT inhaler INHALE 1 PUFF INTO THE LUNGS 2 (TWO) TIMES DAILY. 05/10/15  Yes Kathyrn Drown, MD  fluticasone (FLONASE) 50 MCG/ACT nasal spray Place 1 spray into the nose daily.    Yes Historical Provider, MD  methylPREDNISolone (MEDROL) 4 MG tablet Take 4 mg  by mouth daily.   Yes Historical Provider, MD  metoCLOPramide (REGLAN) 10 MG tablet Take 1 tablet (10 mg total) by mouth every 6 (six) hours as needed for nausea. 06/03/23  Yes Delora Fuel, MD  metoprolol (LOPRESSOR) 50 MG tablet TAKE 1 TABLET (50 MG TOTAL) BY MOUTH DAILY. 05/10/15  Yes Kathyrn Drown, MD  Multiple Vitamin (MULTIVITAMIN WITH MINERALS) TABS Take 1 tablet by mouth daily.   Yes Historical Provider, MD  naphazoline-pheniramine (NAPHCON-A) 0.025-0.3 % ophthalmic solution Place 1 drop into both eyes daily as needed for irritation.   Yes Historical Provider, MD  potassium chloride (KLOR-CON M10) 10 MEQ tablet Take 1 tablet (10 mEq total) by mouth 2 (two) times daily. 05/10/15  Yes Kathyrn Drown, MD  tamsulosin (FLOMAX) 0.4 MG CAPS capsule Take 1 capsule (0.4 mg total) by mouth at bedtime. 05/29/15  Yes Kathyrn Drown, MD  torsemide (DEMADEX) 20 MG tablet Take 1 tablet (20 mg total) by mouth every morning. Patient taking differently: Take 20 mg by mouth 2 (two) times daily.  04/04/15  Yes Samuella Cota, MD  nitroGLYCERIN (NITROSTAT) 0.4 MG SL tablet Place 1 tablet (0.4 mg total) under the tongue every 5 (five) minutes as needed for chest pain. 09/19/13   Kathyrn Drown, MD  ondansetron (ZOFRAN-ODT) 4 MG disintegrating tablet Take 1 tablet (4 mg total) by mouth every 8 (eight) hours as needed for nausea or vomiting. 09/27/15   Davonna Belling, MD  simethicone (MYLICON) 80 MG chewable tablet Chew 80 mg by mouth every 6 (six) hours as needed for flatulence. Reported on 09/27/2015    Historical Provider, MD  torsemide (DEMADEX) 20 MG tablet TAKE 1 TABLET IN THE MORNING AND TAKE 1 TABLET AT NOON Patient not taking: Reported on 09/27/2015 09/24/15   Kathyrn Drown, MD   BP 180/78 mmHg  Pulse 66  Temp(Src) 97.6 F (36.4 C) (Oral)  Resp 18  Ht '5\' 7"'$  (1.702 m)  Wt 143 lb (64.864 kg)  BMI 22.39 kg/m2  SpO2 98% Physical Exam  Constitutional: He is oriented to person, place, and time. He appears  well-developed and well-nourished.  Face is symmetric.  HENT:  Head: Normocephalic.  Eyes: EOM are normal.  Neck: Normal range of motion.  Cardiovascular: Normal rate, regular rhythm and normal heart sounds.   Pulmonary/Chest: Effort normal and breath sounds normal.  Abdominal: Soft. There is no tenderness. There is no rebound and no guarding.  Musculoskeletal: Normal range of motion. He exhibits no edema.  No peripheral edema.  Neurological: He is alert and oriented to person, place, and time. He displays tremor.  Right hand tremor.  Psychiatric: He has a normal mood and affect.  Nursing note and vitals reviewed.   ED Course  Procedures (including critical care time)  COORDINATION OF CARE: 12:08 PM Discussed treatment plan which includes CBC with differential, BMP, and urinalysis with pt at  bedside and pt agreed to plan.  Labs Review Labs Reviewed  CBC WITH DIFFERENTIAL/PLATELET - Abnormal; Notable for the following:    RBC 3.44 (*)    Hemoglobin 10.8 (*)    HCT 33.2 (*)    All other components within normal limits  BASIC METABOLIC PANEL - Abnormal; Notable for the following:    Glucose, Bld 123 (*)    BUN 45 (*)    Creatinine, Ser 2.06 (*)    Calcium 8.7 (*)    GFR calc non Af Amer 27 (*)    GFR calc Af Amer 31 (*)    All other components within normal limits  URINALYSIS, ROUTINE W REFLEX MICROSCOPIC (NOT AT [facility])  TROPONIN I    Imaging Review No results found. I have personally reviewed and evaluated these images and lab results as part of my medical decision-making.   EKG Interpretation   Date/Time:  Friday September 27 2015 11:10:12 EDT Ventricular Rate:  65 PR Interval:  118 QRS Duration: 155 QT Interval:  462 QTC Calculation: 480 R Axis:   -112 Text Interpretation:  Sinus rhythm Atrial premature complex Borderline  short PR interval RBBB and LAFB Confirmed by Alvino Chapel  MD, Emmanuell Kantz [phone])  on 09/27/2015 11:59:45 AM      MDM   Final diagnoses:  Nausea    Patient with nausea and dizziness. Has had episodes of same in the past. Lab work overall reassuring. Feels better after treatment. Has had inpatient workup for this in the past. Will discharge home. I personally performed the services described in this documentation, which was scribed in my presence. The recorded information has been reviewed and is accurate.     Davonna Belling, MD 09/27/15 2141

## 2015-09-27 NOTE — ED Notes (Signed)
Patient is resting comfortably. 

## 2015-09-27 NOTE — ED Notes (Signed)
Pt up sitting on side of bed.  Taking in small bites of food and drinking fluids at this time. Family at bedside.

## 2015-09-27 NOTE — ED Notes (Signed)
Patient ambulatory to the bathroom.  Patient did have nausea upon returning to room.

## 2015-10-03 ENCOUNTER — Encounter: Payer: Self-pay | Admitting: Family Medicine

## 2015-10-03 ENCOUNTER — Ambulatory Visit (INDEPENDENT_AMBULATORY_CARE_PROVIDER_SITE_OTHER): Payer: Medicare Other | Admitting: Family Medicine

## 2015-10-03 VITALS — BP 138/68 | Ht 67.0 in | Wt 146.0 lb

## 2015-10-03 DIAGNOSIS — I10 Essential (primary) hypertension: Secondary | ICD-10-CM | POA: Diagnosis not present

## 2015-10-03 NOTE — Progress Notes (Signed)
   Subjective:    Patient ID: [name], male    DOB: 05-May-1921, 82 y.o.   MRN: [id number]  HPIFollow up visit from [place]. They did scan for lung cancer.   Pt states he had a couple of blood pressure readings where the systolic number was over 893.  Patient relates that 2 different instances his blood pressure was above 200 one time when he is working out another time when he went to ER. He denies any chest tightness pressure pain and headaches. He states his dizziness is doing okay. In addition to this the specialists at do felt things are going good no sign reoccurrence of cancer they encouraged him to follow-up in one years time for repeat CT scan patient has a history of essential tremor. This seems to be stable currently  Review of Systems  Constitutional: Negative for fever, activity change and fatigue.  Respiratory: Negative for cough and shortness of breath.   Cardiovascular: Negative for chest pain and leg swelling.  Neurological: Negative for headaches.       Objective:   Physical Exam  Constitutional: He appears well-nourished. No distress.  Cardiovascular: Normal rate, regular rhythm and normal heart sounds.   No murmur heard. Pulmonary/Chest: Effort normal and breath sounds normal. No respiratory distress.  Musculoskeletal: He exhibits no edema.  Lymphadenopathy:    He has no cervical adenopathy.  Neurological: He is alert.  Psychiatric: His behavior is normal.  Vitals reviewed.         Assessment & Plan:  Blood pressure I checked twice both sitting and standing. Patient has a moderate drop. I would not recommend being on increased dos amount medication. It would put him at risk for accidental falls  Essential tremor stable  Weight stable follow-up in approximately 3 monthse

## 2015-10-22 ENCOUNTER — Other Ambulatory Visit: Payer: Self-pay | Admitting: Family Medicine

## 2015-11-16 ENCOUNTER — Other Ambulatory Visit: Payer: Self-pay | Admitting: Family Medicine

## 2015-12-09 ENCOUNTER — Ambulatory Visit: Payer: Medicare Other | Admitting: Family Medicine

## 2015-12-20 ENCOUNTER — Other Ambulatory Visit: Payer: Self-pay | Admitting: Family Medicine

## 2016-01-09 ENCOUNTER — Ambulatory Visit (INDEPENDENT_AMBULATORY_CARE_PROVIDER_SITE_OTHER): Payer: Medicare Other | Admitting: Family Medicine

## 2016-01-09 ENCOUNTER — Encounter: Payer: Self-pay | Admitting: Family Medicine

## 2016-01-09 VITALS — BP 116/52 | Ht 67.0 in | Wt 145.1 lb

## 2016-01-09 DIAGNOSIS — J431 Panlobular emphysema: Secondary | ICD-10-CM

## 2016-01-09 DIAGNOSIS — I10 Essential (primary) hypertension: Secondary | ICD-10-CM

## 2016-01-09 DIAGNOSIS — R197 Diarrhea, unspecified: Secondary | ICD-10-CM | POA: Diagnosis not present

## 2016-01-09 DIAGNOSIS — E785 Hyperlipidemia, unspecified: Secondary | ICD-10-CM | POA: Diagnosis not present

## 2016-01-09 DIAGNOSIS — G250 Essential tremor: Secondary | ICD-10-CM | POA: Diagnosis not present

## 2016-01-09 DIAGNOSIS — N183 Chronic kidney disease, stage 3 unspecified: Secondary | ICD-10-CM

## 2016-01-09 DIAGNOSIS — Z23 Encounter for immunization: Secondary | ICD-10-CM | POA: Diagnosis not present

## 2016-01-09 MED ORDER — BUDESONIDE-FORMOTEROL FUMARATE 160-4.5 MCG/ACT IN AERO: INHALATION_SPRAY | RESPIRATORY_TRACT | 5 refills | Status: DC

## 2016-01-09 NOTE — Patient Instructions (Signed)
Please do your lab work.  You may use Imodium when necessary for diarrhea  Please follow-up within 3 months

## 2016-01-09 NOTE — Progress Notes (Signed)
   Subjective:    Patient ID: [name], male    DOB: [birth year], 82 y.o.   MRN: [id number]  Hypertension  This is a chronic problem. The current episode started more than 1 year ago. Associated symptoms include shortness of breath. Pertinent negatives include no chest pain or headaches.  Patient been doing well with taking medicine denies dizziness when he stands up He does relate some shortness of breath only using Symbicort 1 puff twice a day. Does not smoke not around smoke denies fevers chills denies bringing up blood does have a history of lung cancer that was being monitored by [facility] they do a CT scan every summer He has had some intermittent loose stools no blood and no mucus in it. Denies abdominal pain Patient has chronic kidney disease try to watch his diet.  Essential tremor stable. Continue current measures Hyperlipidemia needs check lab work watch his diet takes medicine Will need flu shot Patient states no concerns this visit.   Review of Systems  Constitutional: Negative for activity change, fatigue and fever.  HENT: Positive for postnasal drip and rhinorrhea.   Respiratory: Positive for cough, shortness of breath and wheezing.   Cardiovascular: Negative for chest pain and leg swelling.  Neurological: Negative for headaches.       Objective:   Physical Exam  Constitutional: He appears well-nourished. No distress.  Cardiovascular: Normal rate, regular rhythm and normal heart sounds.   No murmur heard. Pulmonary/Chest: Effort normal and breath sounds normal. No respiratory distress.  Musculoskeletal: He exhibits no edema.  Lymphadenopathy:    He has no cervical adenopathy.  Neurological: He is alert.  Psychiatric: His behavior is normal.  Vitals reviewed.         Assessment & Plan:  HTN continue current measures. Orthostatics are good Tremor under decent control continue current measures Diarrhea intermittent if it becomes worse to follow-up Imodium when  necessary Chronic kidney disease lab work ordered monitor closely watch diet Flu shot today Hyperlipidemia continue medication check lab work

## 2016-01-11 ENCOUNTER — Other Ambulatory Visit: Payer: Self-pay | Admitting: Family Medicine

## 2016-01-14 LAB — BASIC METABOLIC PANEL
BUN/Creatinine Ratio: 14 (ref 10–24)
BUN: 28 mg/dL (ref 10–36)
CALCIUM: 9.3 mg/dL (ref 8.6–10.2)
CO2: 24 mmol/L (ref 18–29)
Chloride: 103 mmol/L (ref 96–106)
Creatinine, Ser: 1.94 mg/dL — ABNORMAL HIGH (ref 0.76–1.27)
GFR, EST AFRICAN AMERICAN: 34 mL/min/{1.73_m2} — AB (ref >59)
GFR, EST NON AFRICAN AMERICAN: 29 mL/min/{1.73_m2} — AB (ref >59)
Glucose: 105 mg/dL — ABNORMAL HIGH (ref 65–99)
Potassium: 4.5 mmol/L (ref 3.5–5.2)
Sodium: 144 mmol/L (ref 134–144)

## 2016-01-14 LAB — LIPID PANEL
CHOL/HDL RATIO: 1.8 ratio (ref 0.0–5.0)
Cholesterol, Total: 158 mg/dL (ref 100–199)
HDL: 87 mg/dL (ref >39)
LDL Calculated: 57 mg/dL (ref 0–99)
Triglycerides: 72 mg/dL (ref 0–149)
VLDL CHOLESTEROL CAL: 14 mg/dL (ref 5–40)

## 2016-01-14 LAB — HEPATIC FUNCTION PANEL
ALT: 14 IU/L (ref 0–44)
AST: 18 IU/L (ref 0–40)
Albumin: 3.8 g/dL (ref 3.2–4.6)
Alkaline Phosphatase: 104 IU/L (ref 39–117)
Bilirubin Total: 0.7 mg/dL (ref 0.0–1.2)
Bilirubin, Direct: 0.22 mg/dL (ref 0.00–0.40)
TOTAL PROTEIN: 6.3 g/dL (ref 6.0–8.5)

## 2016-01-14 LAB — TISSUE TRANSGLUTAMINASE, IGA

## 2016-02-12 ENCOUNTER — Other Ambulatory Visit: Payer: Self-pay | Admitting: Family Medicine

## 2016-04-03 ENCOUNTER — Ambulatory Visit (INDEPENDENT_AMBULATORY_CARE_PROVIDER_SITE_OTHER): Payer: Medicare Other | Admitting: Nurse Practitioner

## 2016-04-03 VITALS — BP 122/58 | Temp 98.1°F | Wt 145.2 lb

## 2016-04-03 DIAGNOSIS — R197 Diarrhea, unspecified: Secondary | ICD-10-CM

## 2016-04-03 NOTE — Patient Instructions (Signed)
Align as directed

## 2016-04-04 ENCOUNTER — Encounter: Payer: Self-pay | Admitting: Nurse Practitioner

## 2016-04-04 NOTE — Progress Notes (Signed)
Subjective:  Presents with his son for c/o off/on diarrhea for the past 2 weeks. Occurs 1-2 times per day. No fever of blood in his stool. Mild very brief LLQ abd pain only at night; 4-5/10. No N/V. Loud gurgling in abd at times. Does not seem to be associated with any particular foods. Has been seen for diarrhea several times, most recently in May at our office. Has also had work up at GI specialist. Taking fluids fairly well. Voiding nl.   Objective:   BP (!) 122/58   Temp 98.1 F (36.7 C) (Oral)   Wt 145 lb 3.2 oz (65.9 kg)   BMI 22.74 kg/m  NAD. Alert, oriented. Weight stable x 6 months. TTG IGA neg in September. Lungs clear. Heart RRR. Abdomen soft, non distended, non tender. Hyperactive BS x 4. No obvious masses.   Assessment: Diarrhea, unspecified type  Plan: does not like yogurt. Start daily Align as directed. Warning signs reviewed including fever, bloody stools or abdominal pain. Recheck next week with [name] as planned, call back sooner if needed.

## 2016-04-10 ENCOUNTER — Encounter: Payer: Self-pay | Admitting: Family Medicine

## 2016-04-10 ENCOUNTER — Ambulatory Visit (INDEPENDENT_AMBULATORY_CARE_PROVIDER_SITE_OTHER): Payer: Medicare Other | Admitting: Family Medicine

## 2016-04-10 ENCOUNTER — Ambulatory Visit (HOSPITAL_COMMUNITY)
Admission: RE | Admit: 2016-04-10 | Discharge: 2016-04-10 | Disposition: A | Discharge status: Home / Self Care | Payer: Medicare Other | Source: Ambulatory Visit | Attending: Family Medicine | Admitting: Family Medicine

## 2016-04-10 VITALS — BP 98/70 | Ht 67.0 in | Wt 144.2 lb

## 2016-04-10 DIAGNOSIS — Z85118 Personal history of other malignant neoplasm of bronchus and lung: Secondary | ICD-10-CM | POA: Diagnosis not present

## 2016-04-10 DIAGNOSIS — Z9889 Other specified postprocedural states: Secondary | ICD-10-CM | POA: Insufficient documentation

## 2016-04-10 DIAGNOSIS — R042 Hemoptysis: Secondary | ICD-10-CM | POA: Diagnosis not present

## 2016-04-10 DIAGNOSIS — G250 Essential tremor: Secondary | ICD-10-CM | POA: Diagnosis not present

## 2016-04-10 NOTE — Progress Notes (Signed)
   Subjective:    Patient ID: [name], male    DOB: 29-Dec-1921, 82 y.o.   MRN: [id number]  Hypertension  This is a chronic problem. The current episode started more than 1 year ago. The problem has been gradually improving since onset. There are no associated agents to hypertension. There are no known risk factors for coronary artery disease. Treatments tried: metoprolol. The current treatment provides moderate improvement. There are no compliance problems.    Patient has been dealing with diarrhea for over 1 week now. There is not been any mucus in the stools no blood in stools he is had this off and on happens once every several days. May be related to what he eats or drinks. No antibiotics recently no fever chills or abdominal pain.  Patient is spitting up brown congestion. Onset several weeks ago. Patient relates a brown congestion denies high fever chills sweats denies wheezing difficulty breathing.  Patient also has severe tremors he has an essential tremor it seems to be getting worse this bothers him. Review of Systems Denies chest tightness pressure pain does relate coughing up brown tinged sputum denies sweats chills fevers denies wheezing difficulty breathing does have COPD. Has essential tremors as well    Objective:   Physical Exam Lungs clear hearts regular essential tremor noted with pre-severe tremors in the arms worsened right than the left  No crackles in the lungs     Assessment & Plan:  Blood pressure good control COPD stable Productive phlegm no sign of infection hold off on antibiotics Diarrhea possibly related to food family Ronalee Belts tried Lactaid with dairy products if mucus or bloody stools ear follow-up Referral to neurology at [facility] for essential tremor for evaluation of medications versus other options. Chest x-ray ordered-I do not feel the patient needs to have CAT scan at this time it is scheduled to happen in the middle of June sooner if any  problems. Warning signs discussed

## 2016-04-15 ENCOUNTER — Encounter: Payer: Self-pay | Admitting: Family Medicine

## 2016-05-13 ENCOUNTER — Other Ambulatory Visit: Payer: Self-pay | Admitting: Family Medicine

## 2016-05-14 ENCOUNTER — Emergency Department (HOSPITAL_COMMUNITY): Payer: Medicare Other

## 2016-05-14 ENCOUNTER — Emergency Department (HOSPITAL_COMMUNITY)
Admission: EM | Admit: 2016-05-14 | Discharge: 2016-05-14 | Disposition: A | Discharge status: Home / Self Care | Payer: Medicare Other | Attending: Emergency Medicine | Admitting: Emergency Medicine

## 2016-05-14 ENCOUNTER — Encounter (HOSPITAL_COMMUNITY): Payer: Self-pay | Admitting: *Deleted

## 2016-05-14 DIAGNOSIS — I2510 Atherosclerotic heart disease of native coronary artery without angina pectoris: Secondary | ICD-10-CM | POA: Insufficient documentation

## 2016-05-14 DIAGNOSIS — N183 Chronic kidney disease, stage 3 (moderate): Secondary | ICD-10-CM | POA: Diagnosis not present

## 2016-05-14 DIAGNOSIS — Z85828 Personal history of other malignant neoplasm of skin: Secondary | ICD-10-CM | POA: Diagnosis not present

## 2016-05-14 DIAGNOSIS — Z7982 Long term (current) use of aspirin: Secondary | ICD-10-CM | POA: Insufficient documentation

## 2016-05-14 DIAGNOSIS — Z85118 Personal history of other malignant neoplasm of bronchus and lung: Secondary | ICD-10-CM | POA: Diagnosis not present

## 2016-05-14 DIAGNOSIS — J449 Chronic obstructive pulmonary disease, unspecified: Secondary | ICD-10-CM | POA: Insufficient documentation

## 2016-05-14 DIAGNOSIS — I5032 Chronic diastolic (congestive) heart failure: Secondary | ICD-10-CM | POA: Diagnosis not present

## 2016-05-14 DIAGNOSIS — R1032 Left lower quadrant pain: Secondary | ICD-10-CM | POA: Diagnosis present

## 2016-05-14 DIAGNOSIS — Z87891 Personal history of nicotine dependence: Secondary | ICD-10-CM | POA: Diagnosis not present

## 2016-05-14 DIAGNOSIS — Z79899 Other long term (current) drug therapy: Secondary | ICD-10-CM | POA: Insufficient documentation

## 2016-05-14 DIAGNOSIS — Z951 Presence of aortocoronary bypass graft: Secondary | ICD-10-CM | POA: Diagnosis not present

## 2016-05-14 DIAGNOSIS — I130 Hypertensive heart and chronic kidney disease with heart failure and stage 1 through stage 4 chronic kidney disease, or unspecified chronic kidney disease: Secondary | ICD-10-CM | POA: Insufficient documentation

## 2016-05-14 DIAGNOSIS — R109 Unspecified abdominal pain: Secondary | ICD-10-CM

## 2016-05-14 LAB — COMPREHENSIVE METABOLIC PANEL
ALT: 16 U/L — ABNORMAL LOW (ref 17–63)
ANION GAP: 5 (ref 5–15)
AST: 24 U/L (ref 15–41)
Albumin: 3.6 g/dL (ref 3.5–5.0)
Alkaline Phosphatase: 82 U/L (ref 38–126)
BILIRUBIN TOTAL: 0.8 mg/dL (ref 0.3–1.2)
BUN: 31 mg/dL — ABNORMAL HIGH (ref 6–20)
CHLORIDE: 104 mmol/L (ref 101–111)
CO2: 29 mmol/L (ref 22–32)
Calcium: 8.9 mg/dL (ref 8.9–10.3)
Creatinine, Ser: 2.22 mg/dL — ABNORMAL HIGH (ref 0.61–1.24)
GFR, EST AFRICAN AMERICAN: 28 mL/min — AB (ref >60)
GFR, EST NON AFRICAN AMERICAN: 24 mL/min — AB (ref >60)
Glucose, Bld: 97 mg/dL (ref 65–99)
POTASSIUM: 4.8 mmol/L (ref 3.5–5.1)
Sodium: 138 mmol/L (ref 135–145)
TOTAL PROTEIN: 6.4 g/dL — AB (ref 6.5–8.1)

## 2016-05-14 LAB — CBC
HEMATOCRIT: 32.7 % — AB (ref 39.0–52.0)
HEMOGLOBIN: 10.7 g/dL — AB (ref 13.0–17.0)
MCH: 31.8 pg (ref 26.0–34.0)
MCHC: 32.7 g/dL (ref 30.0–36.0)
MCV: 97.3 fL (ref 78.0–100.0)
Platelets: 185 10*3/uL (ref 150–400)
RBC: 3.36 MIL/uL — AB (ref 4.22–5.81)
RDW: 14.6 % (ref 11.5–15.5)
WBC: 7.1 10*3/uL (ref 4.0–10.5)

## 2016-05-14 LAB — URINALYSIS, ROUTINE W REFLEX MICROSCOPIC
Bilirubin Urine: NEGATIVE
GLUCOSE, UA: NEGATIVE mg/dL
Hgb urine dipstick: NEGATIVE
KETONES UR: NEGATIVE mg/dL
LEUKOCYTES UA: NEGATIVE
NITRITE: NEGATIVE
PH: 6 (ref 5.0–8.0)
Protein, ur: NEGATIVE mg/dL
Specific Gravity, Urine: 1.005 (ref 1.005–1.030)

## 2016-05-14 LAB — LIPASE, BLOOD: Lipase: 23 U/L (ref 11–51)

## 2016-05-14 MED ORDER — DICYCLOMINE HCL 20 MG PO TABS: ORAL_TABLET | ORAL | 0 refills | Status: DC

## 2016-05-14 MED ORDER — ONDANSETRON 4 MG PO TBDP: ORAL_TABLET | ORAL | 0 refills | Status: DC

## 2016-05-14 MED ORDER — HYDROCODONE-ACETAMINOPHEN 5-325 MG PO TABS
1.0000 | ORAL_TABLET | Freq: Once | ORAL | Status: AC
  Administered 2016-05-14: 1 via ORAL
  Filled 2016-05-14: qty 1

## 2016-05-14 NOTE — ED Triage Notes (Signed)
Pt comes in with lower left sided abdominal pain starting 1 hours ago. Pt states the pain has been intermittent for 6 months. Denies n/v. Pt has had diarrhea but none today. NAD noted.

## 2016-05-14 NOTE — Discharge Instructions (Signed)
Drink plenty of fluids and follow-up with your doctor next week

## 2016-05-14 NOTE — ED Provider Notes (Signed)
[place] [facility] DEPT Provider Note   CSN: [id number] Arrival date & time: 05/14/16  1305     History   Chief Complaint Chief Complaint  Patient presents with  . Abdominal Pain    HPI [name] is a 83 y.o. male.  Patient complains of left lower abdominal pain. No vomiting no diarrhea   The history is provided by the patient. No language interpreter was used.  Abdominal Pain   This is a new problem. The current episode started more than 1 week ago. The problem occurs constantly. The problem has not changed since onset.The pain is associated with an unknown factor. The pain is located in the LLQ. The quality of the pain is aching. The pain is at a severity of 4/10. The pain is moderate. Pertinent negatives include anorexia, diarrhea, frequency, hematuria and headaches. Nothing aggravates the symptoms.    Past Medical History:  Diagnosis Date  . ASCVD (arteriosclerotic cardiovascular disease)     CABG in 04/1993; and negative stress nuclear study in 08/2001  . Benign essential tremor   . CAD (coronary artery disease)   . Cancer ([place])    skin  . Cerebrovascular disease    Right carotid bruit-40-69% left internal carotid artery stenosis in 4/06; followed [facility]  . Chronic kidney disease, stage II (mild)    Creatinine-1.6 in 09/2008  . COPD (chronic obstructive pulmonary disease) ([place])   . Degenerative joint disease of knee, left   . Horseshoe kidney   . Hyperlipidemia   . Hypertension   . Lung cancer ([place]) 2013  . Normocytic anemia   . Prediabetes   . Pulmonary disease   . Renal insufficiency   . Syncope   . Tobacco abuse, in remission    40 pack years; discontinued in 1980    Patient Active Problem List   Diagnosis Date Noted  . Diastolic CHF, chronic ([place]) 05/10/2015  . Dizziness   . Coronary artery disease involving coronary bypass graft of native heart without angina pectoris   . Near syncope 04/02/2015  . Diarrhea 04/02/2015  . COPD exacerbation ([place]) 06/04/2013   . SOB (shortness of breath) 06/04/2013  . Acute respiratory failure with hypoxia ([place]) 06/04/2013  . Hemoptysis 08/10/2012  . Prediabetes 08/28/2011  . Lung cancer ([place]) 08/25/2011  . COPD (chronic obstructive pulmonary disease) ([place]) 08/25/2011  . CKD (chronic kidney disease), stage III 12/07/2010  . Chest pain 08/18/2010  . Syncope   . Tobacco abuse, in remission   . Degenerative joint disease of knee, left   . COLONIC POLYPS 05/20/2009  . Hyperlipidemia 05/20/2009  . ANEMIA 05/20/2009  . Essential tremor 05/20/2009  . ATHEROSCLEROTIC CARDIOVASCULAR DISEASE 05/20/2009  . CEREBROVASCULAR DISEASE 05/20/2009  . Essential hypertension 01/14/2009    Past Surgical History:  Procedure Laterality Date  . CARDIAC SURGERY    . COLONOSCOPY W/ POLYPECTOMY  1985  . CORONARY ARTERY BYPASS GRAFT  1995  . LESION EXCISION    . PILONIDAL CYST EXCISION  1948       Home Medications    Prior to Admission medications   Medication Sig Start Date End Date Taking? Authorizing Provider  aspirin EC 81 MG tablet Take 81 mg by mouth daily.   Yes Historical Provider, MD  atorvastatin (LIPITOR) 40 MG tablet TAKE 1 TABLET (40 MG TOTAL) BY MOUTH DAILY. 12/20/15  Yes Kathyrn Drown, MD  budesonide-formoterol (SYMBICORT) 160-4.5 MCG/ACT inhaler INHALE 2 PUFF INTO THE LUNGS 2 (TWO) TIMES DAILY. 01/09/16  Yes [place],  MD  fluticasone (FLONASE) 50 MCG/ACT nasal spray Place 1 spray into the nose daily.    Yes Historical Provider, MD  KLOR-CON M10 10 MEQ tablet TAKE 1 TABLET (10 MEQ TOTAL) BY MOUTH 2 (TWO) TIMES DAILY. 01/13/16  Yes Kathyrn Drown, MD  Loperamide HCl (IMODIUM A-D PO) Take 1 tablet by mouth every 4 (four) hours.   Yes Historical Provider, MD  metoprolol (LOPRESSOR) 50 MG tablet TAKE 1 TABLET (50 MG TOTAL) BY MOUTH DAILY. 02/13/16  Yes Kathyrn Drown, MD  Multiple Vitamin (MULTIVITAMIN WITH MINERALS) TABS Take 1 tablet by mouth daily.   Yes Historical Provider, MD  naphazoline-pheniramine  (NAPHCON-A) 0.025-0.3 % ophthalmic solution Place 1 drop into both eyes daily as needed for irritation.   Yes Historical Provider, MD  nitroGLYCERIN (NITROSTAT) 0.4 MG SL tablet Place 1 tablet (0.4 mg total) under the tongue every 5 (five) minutes as needed for chest pain. 09/19/13  Yes Kathyrn Drown, MD  Probiotic Product (PRO-BIOTIC BLEND PO) Take 1 capsule by mouth daily.   Yes Historical Provider, MD  simethicone (MYLICON) 80 MG chewable tablet Chew 80 mg by mouth every 6 (six) hours as needed for flatulence. Reported on 09/27/2015   Yes Historical Provider, MD  tamsulosin (FLOMAX) 0.4 MG CAPS capsule TAKE 1 CAPSULE (0.4 MG TOTAL) BY MOUTH AT BEDTIME. 11/18/15  Yes Kathyrn Drown, MD  torsemide (DEMADEX) 20 MG tablet TAKE 1 TABLET IN THE MORNING AND TAKE 1 TABLET AT NOON 09/24/15  Yes Kathyrn Drown, MD  VENTOLIN HFA 108 (90 Base) MCG/ACT inhaler INHALE 2 PUFFS INTO THE LUNGS EVERY 6 HOURS AS NEEDED FOR SHORTNESS OF BREATH 05/13/16  Yes Kathyrn Drown, MD    Family History Family History  Problem Relation Age of Onset  . Cancer Brother   . Heart attack Brother     Social History Social History  Substance Use Topics  . Smoking status: Former Smoker    Packs/day: 1.00    Years: 40.00    Types: Cigarettes    Quit date: 08/14/1973  . Smokeless tobacco: Former Systems developer    Types: Chew    Quit date: 12/05/1980  . Alcohol use No     Allergies   Levaquin [levofloxacin in d5w]; Penicillins; and Sulfonamide derivatives   Review of Systems Review of Systems  Constitutional: Negative for appetite change and fatigue.  HENT: Negative for congestion, ear discharge and sinus pressure.   Eyes: Negative for discharge.  Respiratory: Negative for cough.   Cardiovascular: Negative for chest pain.  Gastrointestinal: Positive for abdominal pain. Negative for anorexia and diarrhea.  Genitourinary: Negative for frequency and hematuria.  Musculoskeletal: Negative for back pain.  Skin: Negative for rash.    Neurological: Negative for seizures and headaches.  Psychiatric/Behavioral: Negative for hallucinations.     Physical Exam Updated Vital Signs BP 160/60   Pulse 74   Temp 98.2 F (36.8 C) (Oral)   Resp 21   Ht '5\' 7"'$  (1.702 m)   Wt 142 lb (64.4 kg)   SpO2 100%   BMI 22.24 kg/m   Physical Exam  Constitutional: He is oriented to person, place, and time. He appears well-developed.  HENT:  Head: Normocephalic.  Eyes: Conjunctivae and EOM are normal. No scleral icterus.  Neck: Neck supple. No thyromegaly present.  Cardiovascular: Normal rate and regular rhythm.  Exam reveals no gallop and no friction rub.   No murmur heard. Pulmonary/Chest: No stridor. He has no wheezes. He has no rales. He  exhibits no tenderness.  Abdominal: He exhibits no distension. There is tenderness. There is no rebound.  Minor llq tender  Musculoskeletal: Normal range of motion. He exhibits no edema.  Lymphadenopathy:    He has no cervical adenopathy.  Neurological: He is oriented to person, place, and time. He exhibits normal muscle tone. Coordination normal.  Skin: No rash noted. No erythema.  Psychiatric: He has a normal mood and affect. His behavior is normal.     ED Treatments / Results  Labs (all labs ordered are listed, but only abnormal results are displayed) Labs Reviewed  COMPREHENSIVE METABOLIC PANEL - Abnormal; Notable for the following:       Result Value   BUN 31 (*)    Creatinine, Ser 2.22 (*)    Total Protein 6.4 (*)    ALT 16 (*)    GFR calc non Af Amer 24 (*)    GFR calc Af Amer 28 (*)    All other components within normal limits  CBC - Abnormal; Notable for the following:    RBC 3.36 (*)    Hemoglobin 10.7 (*)    HCT 32.7 (*)    All other components within normal limits  LIPASE, BLOOD  URINALYSIS, ROUTINE W REFLEX MICROSCOPIC    EKG  EKG Interpretation None       Radiology Ct Abdomen Pelvis Wo Contrast  Result Date: 05/14/2016 CLINICAL DATA:  Left-sided  abdominal pain beginning 1 hour ago. Similar intermittent type pain for 6 months. EXAM: CT ABDOMEN AND PELVIS WITHOUT CONTRAST TECHNIQUE: Multidetector CT imaging of the abdomen and pelvis was performed following the standard protocol without IV contrast. COMPARISON:  05/17/2014 FINDINGS: Lower chest: Chronic bronchial wall thickening with some associated scarring in the right lower lobe. 4 mm right lower lobe nodule no, image 2, series 3, stable from the CT dated 08/17/2010. No acute findings is lung bases. Small hiatal hernia. Hepatobiliary: Liver is unremarkable. There is small dependent gallstones similar to the prior CT. No acute cholecystitis. No bile duct dilation. Pancreas: Unremarkable. No pancreatic ductal dilatation or surrounding inflammatory changes. Spleen: Normal in size without focal abnormality. Adrenals/Urinary Tract: No adrenal masses. There is a horseshoe kidney with a thin bridge of parenchyma joining the right and left moieties. No renal masses. No collecting system stones. No hydronephrosis. Ureters normal course and caliber. Bladder is moderately distended. The bladder protrudes posteriorly above the uterus along the right posterior margin of the rectum. No bladder wall thickening or mass. No bladder stone. Stomach/Bowel: Stomach unremarkable other than the small hiatal hernia. Some small bowel lies to the right of the ascending colon raising the possibility of an internal hernia. Small bowel is otherwise unremarkable. There are numerous left colon diverticula. No diverticulitis. No other colon abnormality. Normal appendix is visualized. Vascular/Lymphatic: Dense atherosclerotic calcifications along the aorta its branch vessels. No adenopathy. Reproductive: Mildly enlarged prostate gland. Otherwise unremarkable. Other: Anterior abdominal wall musculature is thickened, but node definitive hernia seen. No ascites. Musculoskeletal: No fracture or acute finding. No osteoblastic or osteolytic  lesions. Arthropathic changes of both hips. Significant degenerative changes noted throughout the lumbar spine. IMPRESSION: 1. No acute findings within the abdomen or pelvis. 2. Possible internal hernia with small bowel lying to the left of the ascending colon. This causes no obstruction or inflammation. 3. Chronic findings include gallstones, dense aortoiliac atherosclerotic calcifications, horseshoe kidney and numerous left colon diverticula without evidence of diverticulitis. Electronically Signed   By: Lajean Manes M.D.   On: 05/14/2016 16:48  Procedures Procedures (including critical care time)  Medications Ordered in ED Medications  HYDROcodone-acetaminophen (NORCO/VICODIN) 5-325 MG per tablet 1 tablet (not administered)     Initial Impression / Assessment and Plan / ED Course  I have reviewed the triage vital signs and the nursing notes.  Pertinent labs & imaging results that were available during my care of the patient were reviewed by me and considered in my medical decision making (see chart for details).     Patient with chronic abdominal pain and nausea. CT scan negative. Patient will be sent home with Bentyl and Zofran will follow-up with PCP  Final Clinical Impressions(s) / ED Diagnoses   Final diagnoses:  Abdominal pain, unspecified abdominal location    New Prescriptions New Prescriptions   No medications on file     Milton Ferguson, MD 05/14/16 [phone]

## 2016-05-15 ENCOUNTER — Encounter: Payer: Self-pay | Admitting: *Deleted

## 2016-05-15 ENCOUNTER — Encounter: Payer: Self-pay | Admitting: Internal Medicine

## 2016-05-18 ENCOUNTER — Other Ambulatory Visit: Payer: Self-pay | Admitting: *Deleted

## 2016-05-18 MED ORDER — BUDESONIDE-FORMOTEROL FUMARATE 160-4.5 MCG/ACT IN AERO: INHALATION_SPRAY | RESPIRATORY_TRACT | 5 refills | Status: DC

## 2016-06-08 ENCOUNTER — Other Ambulatory Visit: Payer: Self-pay | Admitting: Family Medicine

## 2016-06-17 ENCOUNTER — Other Ambulatory Visit: Payer: Self-pay | Admitting: Family Medicine

## 2016-07-04 ENCOUNTER — Other Ambulatory Visit: Payer: Self-pay | Admitting: Family Medicine

## 2016-07-07 ENCOUNTER — Ambulatory Visit: Payer: Medicare Other | Admitting: Family Medicine

## 2016-07-13 ENCOUNTER — Ambulatory Visit (INDEPENDENT_AMBULATORY_CARE_PROVIDER_SITE_OTHER): Payer: Medicare Other | Admitting: Family Medicine

## 2016-07-13 ENCOUNTER — Encounter: Payer: Self-pay | Admitting: Family Medicine

## 2016-07-13 VITALS — BP 138/70 | Ht 67.0 in | Wt 143.0 lb

## 2016-07-13 DIAGNOSIS — I10 Essential (primary) hypertension: Secondary | ICD-10-CM | POA: Diagnosis not present

## 2016-07-13 DIAGNOSIS — N183 Chronic kidney disease, stage 3 unspecified: Secondary | ICD-10-CM

## 2016-07-13 DIAGNOSIS — G250 Essential tremor: Secondary | ICD-10-CM

## 2016-07-13 DIAGNOSIS — J431 Panlobular emphysema: Secondary | ICD-10-CM | POA: Diagnosis not present

## 2016-07-13 DIAGNOSIS — E7849 Other hyperlipidemia: Secondary | ICD-10-CM

## 2016-07-13 DIAGNOSIS — E784 Other hyperlipidemia: Secondary | ICD-10-CM | POA: Diagnosis not present

## 2016-07-13 MED ORDER — BUDESONIDE-FORMOTEROL FUMARATE 160-4.5 MCG/ACT IN AERO: INHALATION_SPRAY | RESPIRATORY_TRACT | 5 refills | Status: DC

## 2016-07-13 MED ORDER — METOPROLOL TARTRATE 50 MG PO TABS: ORAL_TABLET | ORAL | 1 refills | Status: DC

## 2016-07-13 MED ORDER — TORSEMIDE 20 MG PO TABS: ORAL_TABLET | ORAL | 1 refills | Status: DC

## 2016-07-13 MED ORDER — TAMSULOSIN HCL 0.4 MG PO CAPS
0.4000 mg | ORAL_CAPSULE | Freq: Every day | ORAL | 3 refills | Status: DC
Start: 2016-07-13 — End: 2017-08-29

## 2016-07-13 MED ORDER — POTASSIUM CHLORIDE CRYS ER 10 MEQ PO TBCR: 10.0000 meq | EXTENDED_RELEASE_TABLET | Freq: Two times a day (BID) | ORAL | 1 refills | Status: DC

## 2016-07-13 MED ORDER — ALBUTEROL SULFATE HFA 108 (90 BASE) MCG/ACT IN AERS: INHALATION_SPRAY | RESPIRATORY_TRACT | 5 refills | Status: DC

## 2016-07-13 NOTE — Progress Notes (Signed)
   Subjective:    Patient ID: [name], male    DOB: May 12, 1921, 83 y.o.   MRN: [id number]  Hyperlipidemia  This is a chronic problem. The current episode started more than 1 year ago. Associated symptoms include shortness of breath. Pertinent negatives include no chest pain. Treatments tried: atorvastatin. There are no compliance problems.    Pt states no concerns.  Patient has significant essential tremor takes beta blocker for this. His been giving him a lot of trouble he'll be seen a specialist at [facility] in June  Patients emphysema given him some trouble using chronic inhalers as well as albuterol inhalers it flares up during cold weather but he is tolerating his best he can  He has renal insufficiency chronic kidney disease he does try to watch how he eats and tries to stick with his medications  He does have lipid issues see above recent lab work reviewed with the patient new lab work ordered takes his medicine on a regular basis  Review of Systems  Constitutional: Negative for activity change, fatigue and fever.  HENT: Negative for congestion.   Respiratory: Positive for shortness of breath. Negative for cough, choking and chest tightness.   Cardiovascular: Negative for chest pain and leg swelling.  Gastrointestinal: Negative for abdominal pain.  Genitourinary: Negative for difficulty urinating.  Neurological: Negative for headaches.       Objective:   Physical Exam  Constitutional: He appears well-nourished. No distress.  Cardiovascular: Normal rate, regular rhythm and normal heart sounds.   No murmur heard. Pulmonary/Chest: Effort normal and breath sounds normal. No respiratory distress.  Abdominal: Soft. He exhibits no distension. There is no tenderness.  Musculoskeletal: He exhibits no edema.  Lymphadenopathy:    He has no cervical adenopathy.  Neurological: He is alert.  Psychiatric: His behavior is normal.  Vitals reviewed.         Assessment &  Plan:  Hyperlipidemia previous labs reviewed Labs ordered continue medication  Essential tremor fairly severe does not appear to be Parkinson's seen specialist at [facility] already on beta blocker they may try other measures  Blood pressure good control overall continue current measures watch diet  COPD continue chronic inhaler and albuterol when necessary follow-up if ongoing trouble  Chronic kidney disease recent lab work done in the ER shows stability yet creatinine is increased increasing we'll watch [place] every 6 months at the very least  He was counseled to minimize protein in the diet  Abdominal pain went to the [facility] out scan was done no tumor he is not had any abdominal pain sensitive he has more he will follow-up recheck the patient in a proximally 5 months

## 2016-07-15 LAB — LIPID PANEL
CHOL/HDL RATIO: 1.9 ratio (ref 0.0–5.0)
CHOLESTEROL TOTAL: 154 mg/dL (ref 100–199)
HDL: 83 mg/dL (ref >39)
LDL Calculated: 58 mg/dL (ref 0–99)
TRIGLYCERIDES: 66 mg/dL (ref 0–149)
VLDL Cholesterol Cal: 13 mg/dL (ref 5–40)

## 2016-07-17 ENCOUNTER — Ambulatory Visit (INDEPENDENT_AMBULATORY_CARE_PROVIDER_SITE_OTHER): Payer: Medicare Other | Admitting: Family Medicine

## 2016-07-17 ENCOUNTER — Ambulatory Visit (HOSPITAL_COMMUNITY)
Admission: RE | Admit: 2016-07-17 | Discharge: 2016-07-17 | Disposition: A | Discharge status: Home / Self Care | Payer: Medicare Other | Source: Ambulatory Visit | Attending: Family Medicine | Admitting: Family Medicine

## 2016-07-17 ENCOUNTER — Other Ambulatory Visit (HOSPITAL_COMMUNITY)
Admission: RE | Admit: 2016-07-17 | Discharge: 2016-07-17 | Disposition: A | Discharge status: Home / Self Care | Payer: Medicare Other | Source: Ambulatory Visit | Attending: Family Medicine | Admitting: Family Medicine

## 2016-07-17 VITALS — BP 132/78 | Ht 67.0 in | Wt 145.0 lb

## 2016-07-17 DIAGNOSIS — M25531 Pain in right wrist: Secondary | ICD-10-CM

## 2016-07-17 DIAGNOSIS — M11231 Other chondrocalcinosis, right wrist: Secondary | ICD-10-CM | POA: Insufficient documentation

## 2016-07-17 LAB — CBC WITH DIFFERENTIAL/PLATELET
BASOS ABS: 0 10*3/uL (ref 0.0–0.1)
Basophils Relative: 0 %
Eosinophils Absolute: 0.1 10*3/uL (ref 0.0–0.7)
Eosinophils Relative: 2 %
HEMATOCRIT: 32.3 % — AB (ref 39.0–52.0)
HEMOGLOBIN: 10.5 g/dL — AB (ref 13.0–17.0)
LYMPHS ABS: 2.5 10*3/uL (ref 0.7–4.0)
LYMPHS PCT: 26 %
MCH: 32 pg (ref 26.0–34.0)
MCHC: 32.5 g/dL (ref 30.0–36.0)
MCV: 98.5 fL (ref 78.0–100.0)
Monocytes Absolute: 1.2 10*3/uL — ABNORMAL HIGH (ref 0.1–1.0)
Monocytes Relative: 13 %
NEUTROS ABS: 5.7 10*3/uL (ref 1.7–7.7)
Neutrophils Relative %: 59 %
Platelets: 201 10*3/uL (ref 150–400)
RBC: 3.28 MIL/uL — AB (ref 4.22–5.81)
RDW: 14.8 % (ref 11.5–15.5)
WBC: 9.5 10*3/uL (ref 4.0–10.5)

## 2016-07-17 LAB — URIC ACID: Uric Acid, Serum: 8.4 mg/dL — ABNORMAL HIGH (ref 4.4–7.6)

## 2016-07-17 LAB — SEDIMENTATION RATE: Sed Rate: 28 mm/hr — ABNORMAL HIGH (ref 0–16)

## 2016-07-17 MED ORDER — PREDNISONE 20 MG PO TABS: ORAL_TABLET | ORAL | 0 refills | Status: DC

## 2016-07-17 MED ORDER — HYDROCODONE-ACETAMINOPHEN 5-325 MG PO TABS: 1.0000 | ORAL_TABLET | Freq: Four times a day (QID) | ORAL | 0 refills | Status: DC | PRN

## 2016-07-17 MED ORDER — DOXYCYCLINE HYCLATE 100 MG PO TABS: 100.0000 mg | ORAL_TABLET | Freq: Two times a day (BID) | ORAL | 0 refills | Status: DC

## 2016-07-17 NOTE — Progress Notes (Signed)
   Subjective:    Patient ID: [name], male    DOB: Jun 10, 1921, 83 y.o.   MRN: [id number]  Wrist Pain   Pain location: right wrist. This is a new problem. Episode onset: 2 -3  days.  He he had onset of wrist pain and discomfort in the right wrist this been present over the past couple days. Does not know of any injury. Denies any other underlying issue. Denies twisting it falling. He has related pain discomfort warmth in that joint. No skin abrasions. No fever chills or sweats. Does have a history of gout    Review of Systems    please see above no wheezing or difficulty breathing Objective:   Physical Exam patient with essential tremor Lungs are clear hearts regular Increased warmth in the right wrist with some redness and swelling       Assessment & Plan:  Mild wrist pain with redness swelling CBC sedimentation rate CRP await results doxycycline 10 days may need to do prednisone taper pain medicine half tablet every 4 when necessary severe pain caution drowsiness-x-ray ordered  His x-ray was negative for any fracture has significant arthritic changes. His white blood count was normal. Sedimentation rate mildly elevated. Uric acid level elevated. More than likely gout flareup prednisone taper still do doxycycline twice daily for a week if high fevers go to ER all of this was related to the patient and his son follow-up early next week if not improving

## 2016-07-28 ENCOUNTER — Emergency Department (HOSPITAL_COMMUNITY)
Admission: EM | Admit: 2016-07-28 | Discharge: 2016-07-28 | Disposition: A | Discharge status: Home / Self Care | Payer: Medicare Other | Attending: Emergency Medicine | Admitting: Emergency Medicine

## 2016-07-28 ENCOUNTER — Emergency Department (HOSPITAL_COMMUNITY): Payer: Medicare Other

## 2016-07-28 ENCOUNTER — Encounter (HOSPITAL_COMMUNITY): Payer: Self-pay | Admitting: *Deleted

## 2016-07-28 DIAGNOSIS — Y939 Activity, unspecified: Secondary | ICD-10-CM | POA: Insufficient documentation

## 2016-07-28 DIAGNOSIS — R55 Syncope and collapse: Secondary | ICD-10-CM | POA: Insufficient documentation

## 2016-07-28 DIAGNOSIS — S0081XA Abrasion of other part of head, initial encounter: Secondary | ICD-10-CM | POA: Diagnosis not present

## 2016-07-28 DIAGNOSIS — Y999 Unspecified external cause status: Secondary | ICD-10-CM | POA: Insufficient documentation

## 2016-07-28 DIAGNOSIS — Z87891 Personal history of nicotine dependence: Secondary | ICD-10-CM | POA: Diagnosis not present

## 2016-07-28 DIAGNOSIS — W19XXXA Unspecified fall, initial encounter: Secondary | ICD-10-CM | POA: Diagnosis not present

## 2016-07-28 DIAGNOSIS — Y929 Unspecified place or not applicable: Secondary | ICD-10-CM | POA: Insufficient documentation

## 2016-07-28 DIAGNOSIS — T148XXA Other injury of unspecified body region, initial encounter: Secondary | ICD-10-CM

## 2016-07-28 DIAGNOSIS — S0993XA Unspecified injury of face, initial encounter: Secondary | ICD-10-CM | POA: Diagnosis present

## 2016-07-28 HISTORY — DX: Gout, unspecified: M10.9

## 2016-07-28 LAB — COMPREHENSIVE METABOLIC PANEL
ALT: 16 U/L — AB (ref 17–63)
AST: 24 U/L (ref 15–41)
Albumin: 3.5 g/dL (ref 3.5–5.0)
Alkaline Phosphatase: 85 U/L (ref 38–126)
Anion gap: 9 (ref 5–15)
BUN: 42 mg/dL — AB (ref 6–20)
CO2: 29 mmol/L (ref 22–32)
CREATININE: 2.18 mg/dL — AB (ref 0.61–1.24)
Calcium: 8.6 mg/dL — ABNORMAL LOW (ref 8.9–10.3)
Chloride: 103 mmol/L (ref 101–111)
GFR calc Af Amer: 29 mL/min — ABNORMAL LOW (ref >60)
GFR, EST NON AFRICAN AMERICAN: 25 mL/min — AB (ref >60)
GLUCOSE: 113 mg/dL — AB (ref 65–99)
Potassium: 4.1 mmol/L (ref 3.5–5.1)
Sodium: 141 mmol/L (ref 135–145)
Total Bilirubin: 0.6 mg/dL (ref 0.3–1.2)
Total Protein: 6.4 g/dL — ABNORMAL LOW (ref 6.5–8.1)

## 2016-07-28 LAB — CBC WITH DIFFERENTIAL/PLATELET
Basophils Absolute: 0 10*3/uL (ref 0.0–0.1)
Basophils Relative: 0 %
EOS ABS: 0.2 10*3/uL (ref 0.0–0.7)
Eosinophils Relative: 2 %
HEMATOCRIT: 33 % — AB (ref 39.0–52.0)
HEMOGLOBIN: 10.9 g/dL — AB (ref 13.0–17.0)
LYMPHS ABS: 1.9 10*3/uL (ref 0.7–4.0)
LYMPHS PCT: 19 %
MCH: 32.2 pg (ref 26.0–34.0)
MCHC: 33 g/dL (ref 30.0–36.0)
MCV: 97.6 fL (ref 78.0–100.0)
MONOS PCT: 12 %
Monocytes Absolute: 1.2 10*3/uL — ABNORMAL HIGH (ref 0.1–1.0)
NEUTROS ABS: 6.7 10*3/uL (ref 1.7–7.7)
Neutrophils Relative %: 67 %
Platelets: 205 10*3/uL (ref 150–400)
RBC: 3.38 MIL/uL — AB (ref 4.22–5.81)
RDW: 15 % (ref 11.5–15.5)
WBC: 9.9 10*3/uL (ref 4.0–10.5)

## 2016-07-28 LAB — I-STAT TROPONIN, ED: TROPONIN I, POC: 0.02 ng/mL (ref 0.00–0.08)

## 2016-07-28 NOTE — ED Triage Notes (Signed)
Pt is now c/o left sided head pain.

## 2016-07-28 NOTE — ED Triage Notes (Signed)
Pt reports he was clipping his fingernails at the sink today and when he turned around he fell face first onto the floor. Denies LOC. Pt reports he wasn't dizzy prior to fall and is unsure of what made him fall. Pt denies use of any blood thinners. Pt's son reports pt seems to be a "little disoriented". Pt is alert and oriented x 4 at triage. Pt has small abrasions to face from fall. Denies pain.

## 2016-07-28 NOTE — Discharge Instructions (Signed)
Please follow up with your primary care provider in the next week or two.  Return to ER for new / worsening symptoms, any additional concerns.

## 2016-07-28 NOTE — ED Provider Notes (Signed)
[facility] DEPT Provider Note   CSN: [id number] Arrival date & time: 07/28/16  1317     History   Chief Complaint Chief Complaint  Patient presents with  . Fall    HPI [name] is a 83 y.o. male.  The history is provided by the patient and medical records. No language interpreter was used.  Fall  Associated symptoms include headaches.   [name] is a 83 y.o. male  with a PMH of CAD s/p CABG in 1995, CKD, essential tremor who presents to the Emergency Department complaining of fall just prior to arrival. Patient states he was clipping his nails when he turned to his right to place clippers down. As he turned, he felt like his vision tunneled and then he fell face forward onto the ground. Son, whom he lives with, states he heard the fall and came into the room. He seemed confused for about 4-5 minutes, but since then, has been at his baseline mental status. He sustained a small cut to his forehead but no other noticeable injuries. He endorses headache, but no other complaints. Tetanus updated two years ago. No medications taken prior to arrival. No chest pain, shortness of breath, back pain, neck pain, upper or lower extremity pain, numbness, weakness.    Past Medical History:  Diagnosis Date  . ASCVD (arteriosclerotic cardiovascular disease)     CABG in 04/1993; and negative stress nuclear study in 08/2001  . Benign essential tremor   . CAD (coronary artery disease)   . Cancer ([place])    skin  . Cerebrovascular disease    Right carotid bruit-40-69% left internal carotid artery stenosis in 4/06; followed [facility]  . Chronic kidney disease, stage II (mild)    Creatinine-1.6 in 09/2008  . COPD (chronic obstructive pulmonary disease) ([place])   . Degenerative joint disease of knee, left   . Gout   . Horseshoe kidney   . Hyperlipidemia   . Hypertension   . Lung cancer ([place]) 2013  . Normocytic anemia   . Prediabetes   . Pulmonary disease   . Renal insufficiency   . Syncope     . Tobacco abuse, in remission    40 pack years; discontinued in 1980    Patient Active Problem List   Diagnosis Date Noted  . Diastolic CHF, chronic ([place]) 05/10/2015  . Dizziness   . Coronary artery disease involving coronary bypass graft of native heart without angina pectoris   . Near syncope 04/02/2015  . Diarrhea 04/02/2015  . COPD exacerbation ([place]) 06/04/2013  . SOB (shortness of breath) 06/04/2013  . Acute respiratory failure with hypoxia ([place]) 06/04/2013  . Hemoptysis 08/10/2012  . Prediabetes 08/28/2011  . Lung cancer ([place]) 08/25/2011  . COPD (chronic obstructive pulmonary disease) ([place]) 08/25/2011  . CKD (chronic kidney disease), stage III 12/07/2010  . Chest pain 08/18/2010  . Syncope   . Tobacco abuse, in remission   . Degenerative joint disease of knee, left   . COLONIC POLYPS 05/20/2009  . Hyperlipidemia 05/20/2009  . ANEMIA 05/20/2009  . Essential tremor 05/20/2009  . ATHEROSCLEROTIC CARDIOVASCULAR DISEASE 05/20/2009  . CEREBROVASCULAR DISEASE 05/20/2009  . Essential hypertension 01/14/2009    Past Surgical History:  Procedure Laterality Date  . CARDIAC SURGERY    . COLONOSCOPY W/ POLYPECTOMY  1985  . CORONARY ARTERY BYPASS GRAFT  1995  . LESION EXCISION    . PILONIDAL CYST EXCISION  1948       Home Medications    Prior  to Admission medications   Medication Sig Start Date End Date Taking? Authorizing Provider  albuterol (VENTOLIN HFA) 108 (90 Base) MCG/ACT inhaler INHALE 2 PUFFS INTO THE LUNGS EVERY 6 HOURS AS NEEDED FOR SHORTNESS OF BREATH 07/13/16   Kathyrn Drown, MD  aspirin EC 81 MG tablet Take 81 mg by mouth daily.    Historical Provider, MD  atorvastatin (LIPITOR) 40 MG tablet TAKE 1 TABLET (40 MG TOTAL) BY MOUTH DAILY. 07/06/16   Kathyrn Drown, MD  budesonide-formoterol (SYMBICORT) 160-4.5 MCG/ACT inhaler INHALE 2 PUFF INTO THE LUNGS 2 (TWO) TIMES DAILY. 07/13/16   Kathyrn Drown, MD  dicyclomine (BENTYL) 20 MG tablet Take one every 6-87 hours  for abd cramps 05/14/16   Milton Ferguson, MD  doxycycline (VIBRA-TABS) 100 MG tablet Take 1 tablet (100 mg total) by mouth 2 (two) times daily. 07/17/16   Kathyrn Drown, MD  fluticasone (FLONASE) 50 MCG/ACT nasal spray Place 1 spray into the nose daily.     Historical Provider, MD  HYDROcodone-acetaminophen (NORCO/VICODIN) 5-325 MG tablet Take 1 tablet by mouth every 6 (six) hours as needed. 07/17/16   Kathyrn Drown, MD  Loperamide HCl (IMODIUM A-D PO) Take 1 tablet by mouth every 4 (four) hours.    Historical Provider, MD  metoprolol (LOPRESSOR) 50 MG tablet TAKE 1 TABLET (50 MG TOTAL) BY MOUTH DAILY. 07/13/16   Kathyrn Drown, MD  Multiple Vitamin (MULTIVITAMIN WITH MINERALS) TABS Take 1 tablet by mouth daily.    Historical Provider, MD  naphazoline-pheniramine (NAPHCON-A) 0.025-0.3 % ophthalmic solution Place 1 drop into both eyes daily as needed for irritation.    Historical Provider, MD  nitroGLYCERIN (NITROSTAT) 0.4 MG SL tablet Place 1 tablet (0.4 mg total) under the tongue every 5 (five) minutes as needed for chest pain. 09/19/13   Kathyrn Drown, MD  ondansetron (ZOFRAN ODT) 4 MG disintegrating tablet '4mg'$  ODT q4 hours prn nausea/vomit 05/14/16   Milton Ferguson, MD  potassium chloride (KLOR-CON M10) 10 MEQ tablet Take 1 tablet (10 mEq total) by mouth 2 (two) times daily. 07/13/16   Kathyrn Drown, MD  predniSONE (DELTASONE) 20 MG tablet 3qd for 2d then 2qd for 2d then 1qd for 2d 07/17/16   Kathyrn Drown, MD  Probiotic Product (PRO-BIOTIC BLEND PO) Take 1 capsule by mouth daily.    Historical Provider, MD  simethicone (MYLICON) 80 MG chewable tablet Chew 80 mg by mouth every 6 (six) hours as needed for flatulence. Reported on 09/27/2015    Historical Provider, MD  tamsulosin (FLOMAX) 0.4 MG CAPS capsule Take 1 capsule (0.4 mg total) by mouth at bedtime. 07/13/16   Kathyrn Drown, MD  torsemide (DEMADEX) 20 MG tablet TAKE 1 TABLET IN THE MORNING AND TAKE 1 TABLET AT NOON 07/13/16   Kathyrn Drown, MD    Family  History Family History  Problem Relation Age of Onset  . Cancer Brother   . Heart attack Brother     Social History Social History  Substance Use Topics  . Smoking status: Former Smoker    Packs/day: 1.00    Years: 40.00    Types: Cigarettes    Quit date: 08/14/1973  . Smokeless tobacco: Former Systems developer    Types: Chew    Quit date: 12/05/1980  . Alcohol use No     Allergies   Levaquin [levofloxacin in d5w]; Penicillins; and Sulfonamide derivatives   Review of Systems Review of Systems  Neurological: Positive for syncope and headaches. Negative  for dizziness.  All other systems reviewed and are negative.    Physical Exam Updated Vital Signs BP (!) 165/68   Pulse 67   Temp 97.6 F (36.4 C) (Oral)   Resp 16   Ht '5\' 7"'$  (1.702 m)   Wt 65.8 kg   SpO2 100%   BMI 22.71 kg/m   Physical Exam  Constitutional: He is oriented to person, place, and time. He appears well-developed and well-nourished. No distress.  HENT:  Head: Normocephalic.  0.5 cm cut to bridge of nose. 1 cm abrasion to forehead.   Cardiovascular: Normal rate, regular rhythm and normal heart sounds.   No murmur heard. Pulmonary/Chest: Effort normal and breath sounds normal. No respiratory distress.  Abdominal: Soft. He exhibits no distension. There is no tenderness.  Neurological: He is alert and oriented to person, place, and time.  Tremors bilaterally which family state is baseline.  Alert, oriented, thought content appropriate, able to give a coherent history. Speech is clear and goal oriented, able to follow commands.  Cranial Nerves:  II:  Peripheral visual fields grossly normal, pupils equal, round, reactive to light III, IV, VI: EOM intact bilaterally, ptosis not present V,VII: smile symmetric, eyes kept closed tightly against resistance, facial light touch sensation equal VIII: hearing grossly normal IX, X: symmetric soft palate movement, uvula elevates symmetrically  XI: bilateral shoulder shrug  symmetric and strong XII: midline tongue extension 5/5 muscle strength in upper and lower extremities bilaterally including strong and equal grip strength and dorsiflexion/plantar flexion Sensory to light touch normal in all four extremities. Gait not assessed.   Skin: Skin is warm and dry.  Nursing note and vitals reviewed.    ED Treatments / Results  Labs (all labs ordered are listed, but only abnormal results are displayed) Labs Reviewed  CBC WITH DIFFERENTIAL/PLATELET - Abnormal; Notable for the following:       Result Value   RBC 3.38 (*)    Hemoglobin 10.9 (*)    HCT 33.0 (*)    Monocytes Absolute 1.2 (*)    All other components within normal limits  COMPREHENSIVE METABOLIC PANEL - Abnormal; Notable for the following:    Glucose, Bld 113 (*)    BUN 42 (*)    Creatinine, Ser 2.18 (*)    Calcium 8.6 (*)    Total Protein 6.4 (*)    ALT 16 (*)    GFR calc non Af Amer 25 (*)    GFR calc Af Amer 29 (*)    All other components within normal limits  I-STAT TROPOININ, ED    EKG  EKG Interpretation  Date/Time:  Tuesday July 28 2016 14:29:10 EDT Ventricular Rate:  70 PR Interval:    QRS Duration: 142 QT Interval:  420 QTC Calculation: 453 R Axis:   -125 Text Interpretation:  Sinus rhythm Right bundle branch block Abnormal ECG Similar to previous Confirmed by ZAVITZ MD, JOSHUA [phone]) on 07/28/2016 3:39:31 PM       Radiology Ct Head Wo Contrast  Result Date: 07/28/2016 CLINICAL DATA:  83 year old male fell. Denies loss of consciousness. Initial encounter. EXAM: CT HEAD WITHOUT CONTRAST TECHNIQUE: Contiguous axial images were obtained from the base of the skull through the vertex without intravenous contrast. COMPARISON:  10/21/2013. FINDINGS: Brain: No intracranial hemorrhage or CT evidence of large acute infarct. Chronic microvascular changes. Mild atrophy without hydrocephalus. No intracranial mass lesion noted on this unenhanced exam. Vascular: Vascular calcifications  Skull: No skull fracture Sinuses/Orbits: No acute orbital abnormality. Prior lens  replacement. Visualized sinuses are clear. Other: Mastoid air cells and middle ear cavities are clear. IMPRESSION: No skull fracture or intracranial hemorrhage. No CT evidence of large acute infarct. Chronic microvascular changes. Electronically Signed   By: Genia Del M.D.   On: 07/28/2016 15:03    Procedures Procedures (including critical care time)  Medications Ordered in ED Medications - No data to display   Initial Impression / Assessment and Plan / ED Course  I have reviewed the triage vital signs and the nursing notes.  Pertinent labs & imaging results that were available during my care of the patient were reviewed by me and considered in my medical decision making (see chart for details).    [name] is a 83 y.o. male who presents to ED for fall just prior to arrival. Does sound more c/w syncopal episode as patient states his vision tunneled and he fell forward. Small abrasions to the face none requiring repair. Tetanus up-to-date. Neuro exam and mental status baseline. No heart murmur. EKG reassuring. Troponin negative. Mild anemia which is baseline. CT head negative. Offered obs admission for syncope, however patient would very much like to go home. He feels back to baseline and lives with son who agrees to watch him closely for the next 48 hours. Son and patient understand reasons to return to ED. Also understand risks of home care vs. Admit.  All questions answered.   Patient seen by and discussed with Dr. Reather Converse who agrees with treatment plan.    Final Clinical Impressions(s) / ED Diagnoses   Final diagnoses:  Fall, initial encounter  Superficial abrasion    New Prescriptions Discharge Medication List as of 07/28/2016  4:07 PM       [facility], PA-C 07/29/16 0160    Elnora Morrison, MD 07/31/16 1504

## 2016-07-30 ENCOUNTER — Ambulatory Visit (INDEPENDENT_AMBULATORY_CARE_PROVIDER_SITE_OTHER): Payer: Medicare Other | Admitting: Family Medicine

## 2016-07-30 ENCOUNTER — Encounter: Payer: Self-pay | Admitting: Family Medicine

## 2016-07-30 VITALS — BP 122/72 | Ht 67.0 in | Wt 141.6 lb

## 2016-07-30 DIAGNOSIS — G9001 Carotid sinus syncope: Secondary | ICD-10-CM

## 2016-07-30 NOTE — Progress Notes (Signed)
   Subjective:    Patient ID: [name], male    DOB: 07-11-1921, 83 y.o.   MRN: [id number]  HPI Patient arrives for a ER follow up from a recent fall. This patient had a spell where he was walking approximately 30 paces he put clip is down on a table he turned took 2 steps then he passed out he states he did not feel it coming on he was out for just a few seconds and then he was back to his usual self he was checked out ER and released. Patient does have underlying heart disease issues.  Patient denies dizziness with standing. Review of Systems Denies chest tightness pressure pain shortness of breath    Objective:   Physical Exam Lungs clear hearts regular pulse normal BP 130/60 sitting 130/56 standing extremities no edema  ER records reviewed     Assessment & Plan:  Syncope-could be cardiogenic. I would recommend consultation with cardiology may end up needing another echo may need telemetry  Follow-up for standard regular visit in 3 months

## 2016-08-12 ENCOUNTER — Telehealth: Payer: Self-pay | Admitting: Family Medicine

## 2016-08-12 ENCOUNTER — Ambulatory Visit (INDEPENDENT_AMBULATORY_CARE_PROVIDER_SITE_OTHER): Payer: Medicare Other | Admitting: Cardiology

## 2016-08-12 ENCOUNTER — Encounter: Payer: Self-pay | Admitting: Cardiology

## 2016-08-12 VITALS — BP 134/62 | HR 68 | Ht 67.0 in | Wt 140.0 lb

## 2016-08-12 DIAGNOSIS — R55 Syncope and collapse: Secondary | ICD-10-CM | POA: Diagnosis not present

## 2016-08-12 DIAGNOSIS — I951 Orthostatic hypotension: Secondary | ICD-10-CM | POA: Diagnosis not present

## 2016-08-12 MED ORDER — TORSEMIDE 20 MG PO TABS: ORAL_TABLET | ORAL | 1 refills | Status: DC

## 2016-08-12 NOTE — Telephone Encounter (Signed)
FYI- patient seen his cardiology specialist yesterday and was told to stop medication swelling of his legs for now. And he needed to drink more liquids.Son wanted you to know this.

## 2016-08-12 NOTE — Progress Notes (Signed)
Clinical Summary [name] is a 83 y.o.male last seen by NP Purcell Nails, this is our first visit together. This is a focused visit on recent episodes of syncope. For more detailed history please refer to prior clinic notes.   1. Syncope - evaluated in 03/2015 with presyncopal spell - 3 total episodes over the last several years. Stood, walked across room. Bent over to place clippers down. Turned and fell to ground. - found by son 20 seconds later, dazed but responsive.   - similar episode 1 year ago. Stumbled and fell to ground.  - 1 bottle of water daily, occasional pepsi, 2 cups of coffee.   2. Tremor - appt with neuro at [facility]. Family believes possible form of Parkinsons Past Medical History:  Diagnosis Date  . ASCVD (arteriosclerotic cardiovascular disease)     CABG in 04/1993; and negative stress nuclear study in 08/2001  . Benign essential tremor   . CAD (coronary artery disease)   . Cancer ([place])    skin  . Cerebrovascular disease    Right carotid bruit-40-69% left internal carotid artery stenosis in 4/06; followed [facility]  . Chronic kidney disease, stage II (mild)    Creatinine-1.6 in 09/2008  . COPD (chronic obstructive pulmonary disease) ([place])   . Degenerative joint disease of knee, left   . Gout   . Horseshoe kidney   . Hyperlipidemia   . Hypertension   . Lung cancer ([place]) 2013  . Normocytic anemia   . Prediabetes   . Pulmonary disease   . Renal insufficiency   . Syncope   . Tobacco abuse, in remission    40 pack years; discontinued in 1980     Allergies  Allergen Reactions  . Levaquin [Levofloxacin In D5w] Nausea And Vomiting  . Penicillins Hives and Swelling    Has patient had a PCN reaction causing immediate rash, facial/tongue/throat swelling, SOB or lightheadedness with hypotension: Yes Has patient had a PCN reaction causing severe rash involving mucus membranes or skin necrosis: No Has patient had a PCN reaction that required hospitalization No Has patient  had a PCN reaction occurring within the last 10 years: No If all of the above answers are "NO", then may proceed with Cephalosporin use.   . Sulfonamide Derivatives Hives and Swelling     Current Outpatient Prescriptions  Medication Sig Dispense Refill  . albuterol (VENTOLIN HFA) 108 (90 Base) MCG/ACT inhaler INHALE 2 PUFFS INTO THE LUNGS EVERY 6 HOURS AS NEEDED FOR SHORTNESS OF BREATH 54 Inhaler 5  . aspirin EC 81 MG tablet Take 81 mg by mouth daily.    Marland Kitchen atorvastatin (LIPITOR) 40 MG tablet TAKE 1 TABLET (40 MG TOTAL) BY MOUTH DAILY. 90 tablet 1  . budesonide-formoterol (SYMBICORT) 160-4.5 MCG/ACT inhaler INHALE 2 PUFF INTO THE LUNGS 2 (TWO) TIMES DAILY. 30.6 Inhaler 5  . dicyclomine (BENTYL) 20 MG tablet Take one every 6-87 hours for abd cramps 20 tablet 0  . doxycycline (VIBRA-TABS) 100 MG tablet Take 1 tablet (100 mg total) by mouth 2 (two) times daily. 20 tablet 0  . fluticasone (FLONASE) 50 MCG/ACT nasal spray Place 1 spray into the nose daily.     Marland Kitchen HYDROcodone-acetaminophen (NORCO/VICODIN) 5-325 MG tablet Take 1 tablet by mouth every 6 (six) hours as needed. 20 tablet 0  . Loperamide HCl (IMODIUM A-D PO) Take 1 tablet by mouth every 4 (four) hours.    . metoprolol (LOPRESSOR) 50 MG tablet TAKE 1 TABLET (50 MG TOTAL) BY MOUTH DAILY.  90 tablet 1  . Multiple Vitamin (MULTIVITAMIN WITH MINERALS) TABS Take 1 tablet by mouth daily.    . naphazoline-pheniramine (NAPHCON-A) 0.025-0.3 % ophthalmic solution Place 1 drop into both eyes daily as needed for irritation.    . nitroGLYCERIN (NITROSTAT) 0.4 MG SL tablet Place 1 tablet (0.4 mg total) under the tongue every 5 (five) minutes as needed for chest pain. 25 tablet 11  . ondansetron (ZOFRAN ODT) 4 MG disintegrating tablet '4mg'$  ODT q4 hours prn nausea/vomit 12 tablet 0  . potassium chloride (KLOR-CON M10) 10 MEQ tablet Take 1 tablet (10 mEq total) by mouth 2 (two) times daily. 180 tablet 1  . predniSONE (DELTASONE) 20 MG tablet 3qd for 2d then  2qd for 2d then 1qd for 2d 12 tablet 0  . Probiotic Product (PRO-BIOTIC BLEND PO) Take 1 capsule by mouth daily.    . simethicone (MYLICON) 80 MG chewable tablet Chew 80 mg by mouth every 6 (six) hours as needed for flatulence. Reported on 09/27/2015    . tamsulosin (FLOMAX) 0.4 MG CAPS capsule Take 1 capsule (0.4 mg total) by mouth at bedtime. 90 capsule 3  . torsemide (DEMADEX) 20 MG tablet TAKE 1 TABLET IN THE MORNING AND TAKE 1 TABLET AT NOON 180 tablet 1   No current facility-administered medications for this visit.      Past Surgical History:  Procedure Laterality Date  . CARDIAC SURGERY    . COLONOSCOPY W/ POLYPECTOMY  1985  . CORONARY ARTERY BYPASS GRAFT  1995  . LESION EXCISION    . PILONIDAL CYST EXCISION  1948     Allergies  Allergen Reactions  . Levaquin [Levofloxacin In D5w] Nausea And Vomiting  . Penicillins Hives and Swelling    Has patient had a PCN reaction causing immediate rash, facial/tongue/throat swelling, SOB or lightheadedness with hypotension: Yes Has patient had a PCN reaction causing severe rash involving mucus membranes or skin necrosis: No Has patient had a PCN reaction that required hospitalization No Has patient had a PCN reaction occurring within the last 10 years: No If all of the above answers are "NO", then may proceed with Cephalosporin use.   . Sulfonamide Derivatives Hives and Swelling      Family History  Problem Relation Age of Onset  . Cancer Brother   . Heart attack Brother      Social History [name] reports that he quit smoking about 43 years ago. His smoking use included Cigarettes. He has a 40.00 pack-year smoking history. He quit smokeless tobacco use about 35 years ago. His smokeless tobacco use included Chew. [name] reports that he does not drink alcohol.   Review of Systems CONSTITUTIONAL: No weight loss, fever, chills, weakness or fatigue.  HEENT: Eyes: No visual loss, blurred vision, double vision or yellow  sclerae.No hearing loss, sneezing, congestion, runny nose or sore throat.  SKIN: No rash or itching.  CARDIOVASCULAR: no chest pain, no palpitations.  RESPIRATORY: No shortness of breath, cough or sputum.  GASTROINTESTINAL: No anorexia, nausea, vomiting or diarrhea. No abdominal pain or blood.  GENITOURINARY: No burning on urination, no polyuria NEUROLOGICAL: per hpi MUSCULOSKELETAL: No muscle, back pain, joint pain or stiffness.  LYMPHATICS: No enlarged nodes. No history of splenectomy.  PSYCHIATRIC: No history of depression or anxiety.  ENDOCRINOLOGIC: No reports of sweating, cold or heat intolerance. No polyuria or polydipsia.  Marland Kitchen   Physical Examination Vitals:   08/12/16 1057  BP: 134/62  Pulse: 68   Vitals:   08/12/16 1057  Weight: 140 lb (63.5 kg)  Height: '5\' 7"'$  (1.702 m)    Gen: resting comfortably, no acute distress HEENT: no scleral icterus, pupils equal round and reactive, no palptable cervical adenopathy,  CV: RRR, no m/r/g, no jvd Resp: Clear to auscultation bilaterally GI: abdomen is soft, non-tender, non-distended, normal bowel sounds, no hepatosplenomegaly MSK: extremities are warm, no edema.  Skin: warm, no rash Neuro:  no focal deficits Psych: appropriate affect   Diagnostic Studies 03/2015 echo Study Conclusions  - Left ventricle: The cavity size was normal. Wall thickness was   increased in a pattern of mild LVH. Systolic function was normal.   The estimated ejection fraction was in the range of 60% to 65%.   Doppler parameters are consistent with abnormal left ventricular   relaxation (grade 1 diastolic dysfunction). - Mitral valve: Calcified annulus. Mildly thickened leaflets . - Left atrium: The atrium was mildly dilated. - Pulmonary arteries: PA peak pressure: 50 mm Hg (S).   03/2015 heart monitor 30 day - no arrhythmias    Assessment and Plan  1. Syncope - orthostatic with standing, SBP drops 16 points. Possible etiology of episode, in  general he has pretty poor oral hydration - encouraged to increase oral hydration and follow symptoms. Previous extensive cardiac workup after 03/2015 episode that was normal, will not repeat at this time. He has tremor, unclear if some component of parkinsonism, if so could also play some role in his autonomic function. He has upcoming appt with neuro - change torsemide to prn only  F/u 6 weeks      [name], M.D.

## 2016-08-12 NOTE — Patient Instructions (Signed)
Medication Instructions:  TAKE TORSEMIDE 20 MG ( ONCE DAILY- ONLY AS NEEDED FOR SEVERE LEG SELLING)  Labwork: NONE  Testing/Procedures: NONE  Follow-Up: Your physician recommends that you schedule a follow-up appointment in: 6 WEEKS    Any Other Special Instructions Will Be Listed Below (If Applicable).     If you need a refill on your cardiac medications before your next appointment, please call your pharmacy.

## 2016-09-23 ENCOUNTER — Ambulatory Visit: Payer: Medicare Other | Admitting: Cardiology

## 2016-10-05 ENCOUNTER — Telehealth: Payer: Self-pay | Admitting: Family Medicine

## 2016-10-05 ENCOUNTER — Encounter: Payer: Self-pay | Admitting: Family Medicine

## 2016-10-05 DIAGNOSIS — G20 Parkinson's disease: Secondary | ICD-10-CM | POA: Insufficient documentation

## 2016-10-05 DIAGNOSIS — G20A1 Parkinson's disease without dyskinesia, without mention of fluctuations: Secondary | ICD-10-CM | POA: Insufficient documentation

## 2016-10-05 NOTE — Telephone Encounter (Signed)
Spoke with patient's son Ronalee Belts and informed him per Dr.Scott Luking- We have added medication to our medication list. Keep regular follow ups and if problems follow up sooner. Patient verbalized understanding.

## 2016-10-05 NOTE — Telephone Encounter (Signed)
Nurses please update his medication list to include this medicine it may be registered is 1 pill 3 times a day, in addition to this informed family that I have been a made aware of this, please keep all regular follow-ups. If any problems follow-up sooner.

## 2016-10-05 NOTE — Telephone Encounter (Signed)
Patient seen his Neurologist at [facility] last week and was prescribed carpidopa-levodopa 25-100 tab.  Ronalee Belts wanted to update this with [name].  He is taking a half pill 3 times a day for the first week then goes to a full pill.  His BP yesterday was 118/48 and BP this morning 122/53.

## 2016-10-07 ENCOUNTER — Ambulatory Visit (INDEPENDENT_AMBULATORY_CARE_PROVIDER_SITE_OTHER): Payer: Medicare Other | Admitting: Cardiology

## 2016-10-07 ENCOUNTER — Encounter: Payer: Self-pay | Admitting: Cardiology

## 2016-10-07 VITALS — BP 165/65 | HR 68 | Ht 67.0 in | Wt 140.2 lb

## 2016-10-07 DIAGNOSIS — R55 Syncope and collapse: Secondary | ICD-10-CM

## 2016-10-07 NOTE — Patient Instructions (Signed)
Your physician wants you to follow-up in: 6 Months with Dr. Harl Bowie. You will receive a reminder letter in the mail two months in advance. If you don't receive a letter, please call our office to schedule the follow-up appointment.  Your physician recommends that you continue on your current medications as directed. Please refer to the Current Medication list given to you today.  If you need a refill on your cardiac medications before your next appointment, please call your pharmacy.  Thank you for choosing [facility]!

## 2016-10-07 NOTE — Progress Notes (Signed)
Clinical Summary [name] is a 83 y.o.male seen today for follow up of the following medical problems.   1. Syncope - evaluated in 03/2015 with presyncopal spell - 3 total episodes over the last several years. Stood, walked across room. Bent over to place clippers down. Turned and fell to ground. - found by son 20 seconds later, dazed but responsive.   - no recurrent epsidoes since last visit.   2. Parkinsons disease - recent diagnosis, just started on therapy. Followed at [facility]   Past Medical History:  Diagnosis Date  . ASCVD (arteriosclerotic cardiovascular disease)     CABG in 04/1993; and negative stress nuclear study in 08/2001  . Benign essential tremor   . CAD (coronary artery disease)   . Cancer ([place])    skin  . Cerebrovascular disease    Right carotid bruit-40-69% left internal carotid artery stenosis in 4/06; followed [facility]  . Chronic kidney disease, stage II (mild)    Creatinine-1.6 in 09/2008  . COPD (chronic obstructive pulmonary disease) ([place])   . Degenerative joint disease of knee, left   . Gout   . Horseshoe kidney   . Hyperlipidemia   . Hypertension   . Lung cancer ([place]) 2013  . Normocytic anemia   . Prediabetes   . Pulmonary disease   . Renal insufficiency   . Syncope   . Tobacco abuse, in remission    40 pack years; discontinued in 1980     Allergies  Allergen Reactions  . Levaquin [Levofloxacin In D5w] Nausea And Vomiting  . Penicillins Hives and Swelling    Has patient had a PCN reaction causing immediate rash, facial/tongue/throat swelling, SOB or lightheadedness with hypotension: Yes Has patient had a PCN reaction causing severe rash involving mucus membranes or skin necrosis: No Has patient had a PCN reaction that required hospitalization No Has patient had a PCN reaction occurring within the last 10 years: No If all of the above answers are "NO", then may proceed with Cephalosporin use.   . Sulfonamide Derivatives Hives and Swelling      Current Outpatient Prescriptions  Medication Sig Dispense Refill  . albuterol (VENTOLIN HFA) 108 (90 Base) MCG/ACT inhaler INHALE 2 PUFFS INTO THE LUNGS EVERY 6 HOURS AS NEEDED FOR SHORTNESS OF BREATH 54 Inhaler 5  . aspirin EC 81 MG tablet Take 81 mg by mouth daily.    Marland Kitchen atorvastatin (LIPITOR) 40 MG tablet TAKE 1 TABLET (40 MG TOTAL) BY MOUTH DAILY. 90 tablet 1  . budesonide-formoterol (SYMBICORT) 160-4.5 MCG/ACT inhaler INHALE 2 PUFF INTO THE LUNGS 2 (TWO) TIMES DAILY. 30.6 Inhaler 5  . carbidopa-levodopa (SINEMET IR) 25-100 MG tablet 1/2 tab TID for 1 week, then 1 tab TID    . dicyclomine (BENTYL) 20 MG tablet Take one every 6-87 hours for abd cramps 20 tablet 0  . fluticasone (FLONASE) 50 MCG/ACT nasal spray Place 1 spray into the nose daily.     Marland Kitchen HYDROcodone-acetaminophen (NORCO/VICODIN) 5-325 MG tablet Take 1 tablet by mouth every 6 (six) hours as needed. 20 tablet 0  . Loperamide HCl (IMODIUM A-D PO) Take 1 tablet by mouth every 4 (four) hours.    . metoprolol (LOPRESSOR) 50 MG tablet TAKE 1 TABLET (50 MG TOTAL) BY MOUTH DAILY. 90 tablet 1  . Multiple Vitamin (MULTIVITAMIN WITH MINERALS) TABS Take 1 tablet by mouth daily.    . naphazoline-pheniramine (NAPHCON-A) 0.025-0.3 % ophthalmic solution Place 1 drop into both eyes daily as needed for irritation.    Marland Kitchen  nitroGLYCERIN (NITROSTAT) 0.4 MG SL tablet Place 1 tablet (0.4 mg total) under the tongue every 5 (five) minutes as needed for chest pain. 25 tablet 11  . ondansetron (ZOFRAN ODT) 4 MG disintegrating tablet 4mg  ODT q4 hours prn nausea/vomit 12 tablet 0  . potassium chloride (KLOR-CON M10) 10 MEQ tablet Take 1 tablet (10 mEq total) by mouth 2 (two) times daily. 180 tablet 1  . Probiotic Product (PRO-BIOTIC BLEND PO) Take 1 capsule by mouth daily.    . simethicone (MYLICON) 80 MG chewable tablet Chew 80 mg by mouth every 6 (six) hours as needed for flatulence. Reported on 09/27/2015    . tamsulosin (FLOMAX) 0.4 MG CAPS capsule Take  1 capsule (0.4 mg total) by mouth at bedtime. 90 capsule 3  . torsemide (DEMADEX) 20 MG tablet Take 1 tablet daily AS NEEDED FOR SEVERE LEG SWELLING. 90 tablet 1   No current facility-administered medications for this visit.      Past Surgical History:  Procedure Laterality Date  . CARDIAC SURGERY    . COLONOSCOPY W/ POLYPECTOMY  1985  . CORONARY ARTERY BYPASS GRAFT  1995  . LESION EXCISION    . PILONIDAL CYST EXCISION  1948     Allergies  Allergen Reactions  . Levaquin [Levofloxacin In D5w] Nausea And Vomiting  . Penicillins Hives and Swelling    Has patient had a PCN reaction causing immediate rash, facial/tongue/throat swelling, SOB or lightheadedness with hypotension: Yes Has patient had a PCN reaction causing severe rash involving mucus membranes or skin necrosis: No Has patient had a PCN reaction that required hospitalization No Has patient had a PCN reaction occurring within the last 10 years: No If all of the above answers are "NO", then may proceed with Cephalosporin use.   . Sulfonamide Derivatives Hives and Swelling      Family History  Problem Relation Age of Onset  . Cancer Brother   . Heart attack Brother      Social History [name] reports that he quit smoking about 43 years ago. His smoking use included Cigarettes. He has a 40.00 pack-year smoking history. He quit smokeless tobacco use about 35 years ago. His smokeless tobacco use included Chew. [name] reports that he does not drink alcohol.   Review of Systems CONSTITUTIONAL: No weight loss, fever, chills, weakness or fatigue.  HEENT: Eyes: No visual loss, blurred vision, double vision or yellow sclerae.No hearing loss, sneezing, congestion, runny nose or sore throat.  SKIN: No rash or itching.  CARDIOVASCULAR: per hpi RESPIRATORY: No shortness of breath, cough or sputum.  GASTROINTESTINAL: No anorexia, nausea, vomiting or diarrhea. No abdominal pain or blood.  GENITOURINARY: No burning on  urination, no polyuria NEUROLOGICAL: No headache, dizziness, syncope, paralysis, ataxia, numbness or tingling in the extremities. No change in bowel or bladder control.  MUSCULOSKELETAL: No muscle, back pain, joint pain or stiffness.  LYMPHATICS: No enlarged nodes. No history of splenectomy.  PSYCHIATRIC: No history of depression or anxiety.  ENDOCRINOLOGIC: No reports of sweating, cold or heat intolerance. No polyuria or polydipsia.  Marland Kitchen   Physical Examination Vitals:   10/07/16 1335  BP: (!) 165/65  Pulse: 68   Vitals:   10/07/16 1335  Weight: 140 lb 3.2 oz (63.6 kg)  Height: 5\' 7"  (1.702 m)    Gen: resting comfortably, no acute distress HEENT: no scleral icterus, pupils equal round and reactive, no palptable cervical adenopathy,  CV Resp: Clear to auscultation bilaterally GI: abdomen is soft, non-tender, non-distended, normal  bowel sounds, no hepatosplenomegaly MSK: extremities are warm, no edema.  Skin: warm, no rash Neuro:  no focal deficits Psych: appropriate affect   Diagnostic Studies  03/2015 echo Study Conclusions  - Left ventricle: The cavity size was normal. Wall thickness was increased in a pattern of mild LVH. Systolic function was normal. The estimated ejection fraction was in the range of 60% to 65%. Doppler parameters are consistent with abnormal left ventricular relaxation (grade 1 diastolic dysfunction). - Mitral valve: Calcified annulus. Mildly thickened leaflets . - Left atrium: The atrium was mildly dilated. - Pulmonary arteries: PA peak pressure: 50 mm Hg (S).   03/2015 heart monitor 30 day - no arrhythmias   Assessment and Plan  1. Syncope - probable orthostatic syncope, likely some component of autnomic dysfunction related to his parkinsons disease - no recurrent episodes - continue aggressive hydration. Limit home diuretics, encouraged to wear compressoin stockings for both LE edema and his orthostatis       Arnoldo Lenis, M.D.

## 2016-11-04 ENCOUNTER — Ambulatory Visit (INDEPENDENT_AMBULATORY_CARE_PROVIDER_SITE_OTHER): Payer: Medicare Other | Admitting: Family Medicine

## 2016-11-04 ENCOUNTER — Encounter: Payer: Self-pay | Admitting: Family Medicine

## 2016-11-04 VITALS — BP 148/64 | Ht 67.0 in | Wt 138.0 lb

## 2016-11-04 DIAGNOSIS — J431 Panlobular emphysema: Secondary | ICD-10-CM

## 2016-11-04 DIAGNOSIS — N183 Chronic kidney disease, stage 3 unspecified: Secondary | ICD-10-CM

## 2016-11-04 DIAGNOSIS — E875 Hyperkalemia: Secondary | ICD-10-CM | POA: Diagnosis not present

## 2016-11-04 DIAGNOSIS — I10 Essential (primary) hypertension: Secondary | ICD-10-CM | POA: Diagnosis not present

## 2016-11-04 DIAGNOSIS — G20 Parkinson's disease: Secondary | ICD-10-CM | POA: Diagnosis not present

## 2016-11-04 NOTE — Progress Notes (Signed)
   Subjective:    Patient ID: [name], male    DOB: 10/15/1921, 83 y.o.   MRN: [id number]  Hypertension  This is a recurrent problem. The current episode started more than 1 year ago. The problem has been gradually improving since onset.   Diet is good and works out three times per week at the fitness center.  Also Dx with Parkinsons since last seen.   Review of Systems Has some tremors no breathing difficulties feeling good overall no cough or vomiting    Objective:   Physical Exam  Lungs clear heart regular pulse normal tremor noted walking much better      Assessment & Plan:  Parkinson's doing well on medication follows with specialist on a regular basis COPD stable continue current measures follow-up in the fall flu shot then HTN stable, check metabolic 7 to look at creatinine Recheck 3 months

## 2016-11-05 LAB — BASIC METABOLIC PANEL
BUN / CREAT RATIO: 17 (ref 10–24)
BUN: 37 mg/dL — AB (ref 10–36)
CHLORIDE: 104 mmol/L (ref 96–106)
CO2: 25 mmol/L (ref 20–29)
Calcium: 9.2 mg/dL (ref 8.6–10.2)
Creatinine, Ser: 2.23 mg/dL — ABNORMAL HIGH (ref 0.76–1.27)
GFR calc non Af Amer: 25 mL/min/{1.73_m2} — ABNORMAL LOW (ref >59)
GFR, EST AFRICAN AMERICAN: 29 mL/min/{1.73_m2} — AB (ref >59)
GLUCOSE: 94 mg/dL (ref 65–99)
Potassium: 5.7 mmol/L — ABNORMAL HIGH (ref 3.5–5.2)
SODIUM: 144 mmol/L (ref 134–144)

## 2016-11-06 NOTE — Addendum Note (Signed)
Addended by: Launa Grill on: 11/06/2016 08:45 AM   Modules accepted: Orders

## 2016-11-20 ENCOUNTER — Other Ambulatory Visit: Payer: Self-pay | Admitting: *Deleted

## 2016-11-20 DIAGNOSIS — E875 Hyperkalemia: Secondary | ICD-10-CM

## 2016-11-20 DIAGNOSIS — Z79899 Other long term (current) drug therapy: Secondary | ICD-10-CM

## 2016-11-21 LAB — BASIC METABOLIC PANEL
BUN / CREAT RATIO: 18 (ref 10–24)
BUN: 39 mg/dL — ABNORMAL HIGH (ref 10–36)
CALCIUM: 8.7 mg/dL (ref 8.6–10.2)
CHLORIDE: 103 mmol/L (ref 96–106)
CO2: 27 mmol/L (ref 20–29)
Creatinine, Ser: 2.2 mg/dL — ABNORMAL HIGH (ref 0.76–1.27)
GFR, EST AFRICAN AMERICAN: 29 mL/min/{1.73_m2} — AB (ref >59)
GFR, EST NON AFRICAN AMERICAN: 25 mL/min/{1.73_m2} — AB (ref >59)
Glucose: 82 mg/dL (ref 65–99)
POTASSIUM: 4.8 mmol/L (ref 3.5–5.2)
Sodium: 141 mmol/L (ref 134–144)

## 2016-11-21 LAB — MAGNESIUM: Magnesium: 2.2 mg/dL (ref 1.6–2.3)

## 2016-11-30 ENCOUNTER — Other Ambulatory Visit: Payer: Self-pay | Admitting: Family Medicine

## 2016-12-23 ENCOUNTER — Ambulatory Visit: Payer: Medicare Other | Admitting: Family Medicine

## 2017-01-15 ENCOUNTER — Other Ambulatory Visit: Payer: Self-pay | Admitting: Family Medicine

## 2017-02-05 ENCOUNTER — Other Ambulatory Visit: Payer: Self-pay | Admitting: Family Medicine

## 2017-02-09 ENCOUNTER — Encounter: Payer: Self-pay | Admitting: Family Medicine

## 2017-02-09 ENCOUNTER — Ambulatory Visit (INDEPENDENT_AMBULATORY_CARE_PROVIDER_SITE_OTHER): Payer: Medicare Other | Admitting: Family Medicine

## 2017-02-09 VITALS — BP 130/78 | Ht 67.0 in | Wt 137.8 lb

## 2017-02-09 DIAGNOSIS — N183 Chronic kidney disease, stage 3 unspecified: Secondary | ICD-10-CM

## 2017-02-09 DIAGNOSIS — Z23 Encounter for immunization: Secondary | ICD-10-CM

## 2017-02-09 DIAGNOSIS — I10 Essential (primary) hypertension: Secondary | ICD-10-CM | POA: Diagnosis not present

## 2017-02-09 DIAGNOSIS — G20 Parkinson's disease: Secondary | ICD-10-CM | POA: Diagnosis not present

## 2017-02-09 NOTE — Progress Notes (Signed)
   Subjective:    Patient ID: [name], male    DOB: [birth year], 83 y.o.   MRN: [id number]  Hypertension  This is a chronic problem. The current episode started more than 1 year ago. Pertinent negatives include no chest pain or headaches. Risk factors for coronary artery disease include male gender. Treatments tried: lopressor. There are no compliance problems.    Significant Parkinson's disease no true dizziness but does get a little unstable when he stands up He does have history of chronic kidney disease tries to watch his diet he is on a diuretic not on any potassium History of hypokalemia as well History of high blood pressure takes medication as tolerated    Review of Systems  Constitutional: Positive for fatigue. Negative for activity change, appetite change and fever.  HENT: Negative for congestion and rhinorrhea.   Respiratory: Negative for cough.   Cardiovascular: Negative for chest pain.  Gastrointestinal: Negative for abdominal pain, constipation and nausea.  Endocrine: Negative for polydipsia and polyphagia.  Musculoskeletal: Positive for arthralgias. Negative for myalgias.  Neurological: Positive for light-headedness. Negative for weakness, numbness and headaches.  Psychiatric/Behavioral: Negative for confusion.       Objective:   Physical Exam  Constitutional: He appears well-nourished. No distress.  HENT:  Head: Normocephalic.  Eyes: Right eye exhibits no discharge. Left eye exhibits no discharge.  Cardiovascular: Normal rate, regular rhythm and normal heart sounds.  Exam reveals no friction rub.   No murmur heard. Pulmonary/Chest: Effort normal and breath sounds normal. No respiratory distress. He has no wheezes. He has no rales.  Musculoskeletal: He exhibits no edema.  Lymphadenopathy:    He has no cervical adenopathy.  Neurological: He is alert.  He does have tremor in both arms and legs  Skin: No rash noted. No erythema.  Psychiatric: His behavior is  normal.  Vitals reviewed.     25 minutes was spent with the patient. Greater than half the time was spent in discussion and answering questions and counseling regarding the issues that the patient came in for today.     Assessment & Plan:  Parkinson's-increase a dose of medication to 1-1/2 tablet 3 times a day gradually if side effects stop  Follow-up with [facility]y at [facility] later this year  Blood pressures are good no orthostasis  Metabolic 7 repeat because patient currently not on potassium and using diuretic intermittently  Renal dysfunction recheck creatinine

## 2017-02-10 LAB — BASIC METABOLIC PANEL
BUN / CREAT RATIO: 17 (ref 10–24)
BUN: 38 mg/dL — ABNORMAL HIGH (ref 10–36)
CHLORIDE: 102 mmol/L (ref 96–106)
CO2: 24 mmol/L (ref 20–29)
Calcium: 9.2 mg/dL (ref 8.6–10.2)
Creatinine, Ser: 2.23 mg/dL — ABNORMAL HIGH (ref 0.76–1.27)
GFR calc Af Amer: 28 mL/min/{1.73_m2} — ABNORMAL LOW (ref >59)
GFR calc non Af Amer: 24 mL/min/{1.73_m2} — ABNORMAL LOW (ref >59)
GLUCOSE: 92 mg/dL (ref 65–99)
Potassium: 5 mmol/L (ref 3.5–5.2)
Sodium: 143 mmol/L (ref 134–144)

## 2017-02-10 LAB — MAGNESIUM: MAGNESIUM: 2.4 mg/dL — AB (ref 1.6–2.3)

## 2017-03-12 ENCOUNTER — Other Ambulatory Visit (HOSPITAL_COMMUNITY): Payer: Self-pay | Admitting: Podiatry

## 2017-03-12 DIAGNOSIS — I739 Peripheral vascular disease, unspecified: Secondary | ICD-10-CM

## 2017-03-15 ENCOUNTER — Ambulatory Visit: Payer: Medicare Other | Admitting: Family Medicine

## 2017-03-15 ENCOUNTER — Encounter: Payer: Self-pay | Admitting: Family Medicine

## 2017-03-15 VITALS — BP 132/70 | Ht 67.0 in | Wt 138.0 lb

## 2017-03-15 DIAGNOSIS — M109 Gout, unspecified: Secondary | ICD-10-CM

## 2017-03-15 DIAGNOSIS — M79673 Pain in unspecified foot: Secondary | ICD-10-CM | POA: Diagnosis not present

## 2017-03-15 DIAGNOSIS — L03119 Cellulitis of unspecified part of limb: Secondary | ICD-10-CM | POA: Diagnosis not present

## 2017-03-15 MED ORDER — PREDNISONE 20 MG PO TABS: ORAL_TABLET | ORAL | 0 refills | Status: DC

## 2017-03-15 MED ORDER — DOXYCYCLINE HYCLATE 100 MG PO TABS: 100.0000 mg | ORAL_TABLET | Freq: Two times a day (BID) | ORAL | 0 refills | Status: DC

## 2017-03-15 NOTE — Progress Notes (Signed)
   Subjective:    Patient ID: [name], male    DOB: 04-22-1921, 83 y.o.   MRN: [id number]  HPIleft foot pain and swelling. Started on week ago. Has seen podiatry. Had a compounded cream called in last week. Ketamine 5%, baclofen 2%, gabapentin 5%, lidocaine 5%, and menthol 5%.  Patient relates bilateral foot swelling worse on the left side than the right side relates some redness and pain and discomfort denies high fever chills sweats denies shortness of breath nausea vomiting or chest pressure   Review of Systems Please see above    Objective:   Physical Exam  Lungs are clear heart rate is controlled pulses normal he does have cellulitis of the left foot with some swelling and tenderness in the MTP joint and in the MTP joint next to the first 1 no abscess seen      Assessment & Plan:  Bilateral pedal edema I do not find evidence of CHF  I believe the main problem is chronic kidney disease that is contributing to the swelling in his legs also feel the cellulitis in his foot is also contributing He does have significant cellulitis and swelling and gout in the left foot prednisone antibiotics and lab work ordered await the results

## 2017-03-16 ENCOUNTER — Ambulatory Visit (HOSPITAL_COMMUNITY)
Admission: RE | Admit: 2017-03-16 | Discharge: 2017-03-16 | Disposition: A | Discharge status: Home / Self Care | Payer: Medicare Other | Source: Ambulatory Visit | Attending: Podiatry | Admitting: Podiatry

## 2017-03-16 DIAGNOSIS — I739 Peripheral vascular disease, unspecified: Secondary | ICD-10-CM | POA: Insufficient documentation

## 2017-03-16 DIAGNOSIS — M79605 Pain in left leg: Secondary | ICD-10-CM | POA: Diagnosis not present

## 2017-03-16 DIAGNOSIS — M79604 Pain in right leg: Secondary | ICD-10-CM | POA: Diagnosis present

## 2017-03-16 LAB — CBC WITH DIFFERENTIAL/PLATELET
BASOS ABS: 0 10*3/uL (ref 0.0–0.2)
Basos: 0 %
EOS (ABSOLUTE): 0.2 10*3/uL (ref 0.0–0.4)
Eos: 3 %
HEMATOCRIT: 33.6 % — AB (ref 37.5–51.0)
HEMOGLOBIN: 10.8 g/dL — AB (ref 13.0–17.7)
Immature Grans (Abs): 0 10*3/uL (ref 0.0–0.1)
Immature Granulocytes: 0 %
LYMPHS ABS: 1.7 10*3/uL (ref 0.7–3.1)
Lymphs: 25 %
MCH: 30.9 pg (ref 26.6–33.0)
MCHC: 32.1 g/dL (ref 31.5–35.7)
MCV: 96 fL (ref 79–97)
MONOCYTES: 11 %
MONOS ABS: 0.7 10*3/uL (ref 0.1–0.9)
NEUTROS ABS: 4.2 10*3/uL (ref 1.4–7.0)
Neutrophils: 61 %
PLATELETS: 265 10*3/uL (ref 150–379)
RBC: 3.5 x10E6/uL — AB (ref 4.14–5.80)
RDW: 14.6 % (ref 12.3–15.4)
WBC: 6.9 10*3/uL (ref 3.4–10.8)

## 2017-03-16 LAB — SEDIMENTATION RATE: SED RATE: 13 mm/h (ref 0–30)

## 2017-03-16 LAB — URIC ACID: URIC ACID: 9 mg/dL — AB (ref 3.7–8.6)

## 2017-03-29 ENCOUNTER — Telehealth: Payer: Self-pay | Admitting: Family Medicine

## 2017-03-29 NOTE — Telephone Encounter (Signed)
1.  I would recommend healthy diet but do not be too strict on cutting out sugars and fats.  Calories in keeping his weight are more important.  I think it would be reasonable for the patient to do a follow-up visit with [place] over the next 7-10 days we can see him later this week or next week.  I also think it is reasonable to have him follow-up with his specialist as well but often that can take several weeks to get it-the son can help set that up with the specialist

## 2017-03-29 NOTE — Telephone Encounter (Signed)
This is his uric acid lab faxed from his podiatrist.

## 2017-03-29 NOTE — Telephone Encounter (Signed)
Son(POA) advised .  Dr Nicki Reaper would recommend healthy diet but do not be too strict on cutting out sugars and fats.  Calories in keeping his weight are more important.  Dr Nicki Reaper  thinks it would be reasonable for the patient to do a follow-up visit with [place] over the next 7-10 days we can see him later this week or next week.  Dr Nicki Reaper also thinks it is reasonable to have him follow-up with his specialist as well but often that can take several weeks to get it-the son can help set that up with the specialist Son verbalized understanding and scheduled follow up office visit.

## 2017-03-29 NOTE — Telephone Encounter (Signed)
Pt has lost 6 pounds in a week and the son states that he has cut back sugar in the past couple weeks. Son wants to know if he needs to be seen here or if he needs to be seen by the cancer dr at [facility]. Please advise.

## 2017-03-29 NOTE — Telephone Encounter (Signed)
Review lab results in results folder.

## 2017-03-30 ENCOUNTER — Encounter: Payer: Self-pay | Admitting: Podiatry

## 2017-03-30 ENCOUNTER — Ambulatory Visit: Payer: Medicare Other | Admitting: Podiatry

## 2017-03-30 ENCOUNTER — Ambulatory Visit (INDEPENDENT_AMBULATORY_CARE_PROVIDER_SITE_OTHER): Payer: Medicare Other

## 2017-03-30 DIAGNOSIS — M109 Gout, unspecified: Secondary | ICD-10-CM

## 2017-03-30 NOTE — Patient Instructions (Signed)

## 2017-03-31 ENCOUNTER — Telehealth: Payer: Self-pay | Admitting: *Deleted

## 2017-03-31 LAB — URIC ACID: URIC ACID: 11 mg/dL — AB (ref 3.7–8.6)

## 2017-03-31 NOTE — Telephone Encounter (Signed)
-----   Message from Trula Slade, DPM sent at 03/31/2017  3:24 PM EST ----- Uric acid is still elevated at 11. We should recheck in 4 weeks. Please let him know. Also follow-up with PCP

## 2017-03-31 NOTE — Progress Notes (Signed)
Subjective:   Patient ID: [name], male   DOB: 83 y.o.   MRN: [id number]   HPI Mr. Rohrbach presents the office today for concerns of gout to his left foot.  He presents today with his wife and son.  This is been ongoing for the last several weeks he has been on 2 rounds of prednisone and he finishes this round of prednisone today.  He states that since the second round of prednisone his symptoms are much improved and he has not had any swelling or pain.  He is able to wear regular shoe without any problems.  He denies any recent injury or trauma to his feet.  He denies any claudication symptoms.  He has no other concerns today.   Review of Systems  All other systems reviewed and are negative.  Past Medical History:  Diagnosis Date  . ASCVD (arteriosclerotic cardiovascular disease)     CABG in 04/1993; and negative stress nuclear study in 08/2001  . Benign essential tremor   . CAD (coronary artery disease)   . Cancer ([place])    skin  . Cerebrovascular disease    Right carotid bruit-40-69% left internal carotid artery stenosis in 4/06; followed [facility]  . Chronic kidney disease, stage II (mild)    Creatinine-1.6 in 09/2008  . COPD (chronic obstructive pulmonary disease) ([place])   . Degenerative joint disease of knee, left   . Gout   . Horseshoe kidney   . Hyperlipidemia   . Hypertension   . Lung cancer ([place]) 2013  . Normocytic anemia   . Prediabetes   . Pulmonary disease   . Renal insufficiency   . Syncope   . Tobacco abuse, in remission    40 pack years; discontinued in 1980    Past Surgical History:  Procedure Laterality Date  . CARDIAC SURGERY    . COLONOSCOPY W/ POLYPECTOMY  1985  . CORONARY ARTERY BYPASS GRAFT  1995  . LESION EXCISION    . PILONIDAL CYST EXCISION  1948     Current Outpatient Medications:  .  primidone (MYSOLINE) 50 MG tablet, Take by mouth., Disp: , Rfl:  .  albuterol (VENTOLIN HFA) 108 (90 Base) MCG/ACT inhaler, INHALE 2 PUFFS INTO THE LUNGS EVERY 6 HOURS  AS NEEDED FOR SHORTNESS OF BREATH, Disp: 54 Inhaler, Rfl: 5 .  aspirin EC 81 MG tablet, Take 81 mg by mouth daily., Disp: , Rfl:  .  atorvastatin (LIPITOR) 40 MG tablet, TAKE 1 TABLET (40 MG TOTAL) BY MOUTH DAILY., Disp: 90 tablet, Rfl: 1 .  budesonide-formoterol (SYMBICORT) 160-4.5 MCG/ACT inhaler, INHALE 2 PUFF INTO THE LUNGS 2 (TWO) TIMES DAILY., Disp: 30.6 Inhaler, Rfl: 5 .  carbidopa-levodopa (SINEMET IR) 25-100 MG tablet, 1/2 tab TID for 1 week, then 1 tab TID, Disp: , Rfl:  .  doxycycline (VIBRA-TABS) 100 MG tablet, Take 1 tablet (100 mg total) by mouth 2 (two) times daily., Disp: 14 tablet, Rfl: 0 .  KLOR-CON M10 10 MEQ tablet, TAKE 1 TABLET (10 MEQ TOTAL) BY MOUTH 2 (TWO) TIMES DAILY. (Patient not taking: Reported on 03/15/2017), Disp: 180 tablet, Rfl: 1 .  Loperamide HCl (IMODIUM A-D PO), Take 1 tablet by mouth every 4 (four) hours., Disp: , Rfl:  .  metoprolol tartrate (LOPRESSOR) 50 MG tablet, TAKE 1 TABLET BY MOUTH EVERY DAY, Disp: 90 tablet, Rfl: 1 .  Multiple Vitamin (MULTIVITAMIN WITH MINERALS) TABS, Take 1 tablet by mouth daily., Disp: , Rfl:  .  naphazoline-pheniramine (NAPHCON-A) 0.025-0.3 % ophthalmic  solution, Place 1 drop into both eyes daily as needed for irritation., Disp: , Rfl:  .  nitroGLYCERIN (NITROSTAT) 0.4 MG SL tablet, Place 1 tablet (0.4 mg total) under the tongue every 5 (five) minutes as needed for chest pain., Disp: 25 tablet, Rfl: 11 .  predniSONE (DELTASONE) 20 MG tablet, Take 3 qd for 2 days, then 2 qd for 2 days, then one qd for 2 days, Disp: 12 tablet, Rfl: 0 .  tamsulosin (FLOMAX) 0.4 MG CAPS capsule, Take 1 capsule (0.4 mg total) by mouth at bedtime., Disp: 90 capsule, Rfl: 3 .  torsemide (DEMADEX) 20 MG tablet, Take 1 tablet daily AS NEEDED FOR SEVERE LEG SWELLING. (Patient taking differently: Take 20 mg by mouth daily. ), Disp: 90 tablet, Rfl: 1  Allergies  Allergen Reactions  . Levaquin [Levofloxacin In D5w] Nausea And Vomiting  . Penicillins Hives and  Swelling    Has patient had a PCN reaction causing immediate rash, facial/tongue/throat swelling, SOB or lightheadedness with hypotension: Yes Has patient had a PCN reaction causing severe rash involving mucus membranes or skin necrosis: No Has patient had a PCN reaction that required hospitalization No Has patient had a PCN reaction occurring within the last 10 years: No If all of the above answers are "NO", then may proceed with Cephalosporin use.   . Sulfonamide Derivatives Hives and Swelling    Social History   Socioeconomic History  . Marital status: Married    Spouse name: Not on file  . Number of children: 3  . Years of education: Not on file  . Highest education level: Not on file  Social Needs  . Financial resource strain: Not on file  . Food insecurity - worry: Not on file  . Food insecurity - inability: Not on file  . Transportation needs - medical: Not on file  . Transportation needs - non-medical: Not on file  Occupational History  . Occupation: retired    Comment: Clinical biochemist with [employer]  Tobacco Use  . Smoking status: Former Smoker    Packs/day: 1.00    Years: 40.00    Pack years: 40.00    Types: Cigarettes    Last attempt to quit: 08/14/1973    Years since quitting: 43.6  . Smokeless tobacco: Former Systems developer    Types: [place] date: 12/05/1980  Substance and Sexual Activity  . Alcohol use: No    Alcohol/week: 0.0 oz  . Drug use: No  . Sexual activity: Not Currently  Other Topics Concern  . Not on file  Social History Narrative  . Not on file         Objective:  Physical Exam  General: AAO x3, NAD  Dermatological: Skin is warm, dry and supple bilateral. Nails x 10 are well manicured; remaining integument appears unremarkable at this time. There are no open sores, no preulcerative lesions, no rash or signs of infection present.  Vascular: Dorsalis Pedis artery and Posterior Tibial artery pedal pulses are 2/4 bilateral with immedate capillary  fill time. No varicosities and no lower extremity edema present bilateral. There is no pain with calf compression, swelling, warmth, erythema.   Neruologic: Grossly intact via light touch bilateral. Vibratory intact via tuning fork bilateral. Protective threshold with Semmes Wienstein monofilament intact to all pedal sites bilateral.   Musculoskeletal: There is no tenderness the left first MPJ.  There is some very minimal edema there is no significant erythema or increase in warmth.  There is no areas of  fluctuance or crepitus.  There is no malodor.  There is no other areas of pinpoint tenderness or pain to vibratory sensation.  Muscular strength 5/5 in all groups tested bilateral.  Gait: Unassisted, Nonantalgic.       Assessment:   Gout left foot which appears to be improving after second round of prednisone; PAD    Plan:  -Reviewed the patient's lab work in chart.  Treatment options discussed including all alternatives, risks, and complications -Etiology of symptoms were discussed -At this time his symptoms with again are much improved.  We discussed diet modifications as well as cherry juice.  I would like to recheck his uric acid level however.  As he is not having any pain today we will hold off on a steroid injection or other treatments. -I reviewed the arterial studies.  There does appear to be calcification in the arteries.  He has no claudication symptoms, no open sores and given his kidney function I do not think that angiogram is needed at this point.  However we did discuss that there is some arterial insufficiency need to monitor this closely.  Trula Slade DPM

## 2017-03-31 NOTE — Telephone Encounter (Signed)
I informed pt's son, Legrand Como and he states pt has an appt with Dr. Wolfgang Phoenix on 04/08/2017.

## 2017-04-01 NOTE — Telephone Encounter (Signed)
Please let the son know that I reviewed over his dad uric acid level it was 10.5 ideally we would like to see it below 7.  His dad is not a good candidate for allopurinol because of his kidney function therefore I recommend that the patient try to maintain a low purine diet-if the son would benefit from having [place] mail him this information we will be happy to do so just let me know

## 2017-04-02 NOTE — Telephone Encounter (Signed)
Dtr Janean Sark is aware.

## 2017-04-06 ENCOUNTER — Observation Stay (HOSPITAL_COMMUNITY)
Admission: EM | Admit: 2017-04-06 | Discharge: 2017-04-08 | Disposition: A | Discharge status: Home / Self Care | Payer: Medicare Other | Attending: Internal Medicine | Admitting: Internal Medicine

## 2017-04-06 ENCOUNTER — Emergency Department (HOSPITAL_COMMUNITY): Payer: Medicare Other

## 2017-04-06 ENCOUNTER — Other Ambulatory Visit: Payer: Self-pay

## 2017-04-06 DIAGNOSIS — I080 Rheumatic disorders of both mitral and aortic valves: Secondary | ICD-10-CM | POA: Diagnosis not present

## 2017-04-06 DIAGNOSIS — I5032 Chronic diastolic (congestive) heart failure: Secondary | ICD-10-CM | POA: Insufficient documentation

## 2017-04-06 DIAGNOSIS — N183 Chronic kidney disease, stage 3 (moderate): Secondary | ICD-10-CM | POA: Diagnosis not present

## 2017-04-06 DIAGNOSIS — G20 Parkinson's disease: Secondary | ICD-10-CM | POA: Insufficient documentation

## 2017-04-06 DIAGNOSIS — J441 Chronic obstructive pulmonary disease with (acute) exacerbation: Principal | ICD-10-CM | POA: Insufficient documentation

## 2017-04-06 DIAGNOSIS — Z79899 Other long term (current) drug therapy: Secondary | ICD-10-CM | POA: Insufficient documentation

## 2017-04-06 DIAGNOSIS — Z85828 Personal history of other malignant neoplasm of skin: Secondary | ICD-10-CM | POA: Diagnosis not present

## 2017-04-06 DIAGNOSIS — G250 Essential tremor: Secondary | ICD-10-CM | POA: Diagnosis not present

## 2017-04-06 DIAGNOSIS — R634 Abnormal weight loss: Secondary | ICD-10-CM | POA: Insufficient documentation

## 2017-04-06 DIAGNOSIS — E785 Hyperlipidemia, unspecified: Secondary | ICD-10-CM | POA: Diagnosis not present

## 2017-04-06 DIAGNOSIS — I4510 Unspecified right bundle-branch block: Secondary | ICD-10-CM | POA: Insufficient documentation

## 2017-04-06 DIAGNOSIS — I5030 Unspecified diastolic (congestive) heart failure: Secondary | ICD-10-CM | POA: Diagnosis not present

## 2017-04-06 DIAGNOSIS — Z882 Allergy status to sulfonamides status: Secondary | ICD-10-CM | POA: Insufficient documentation

## 2017-04-06 DIAGNOSIS — Z880 Allergy status to penicillin: Secondary | ICD-10-CM | POA: Insufficient documentation

## 2017-04-06 DIAGNOSIS — Q631 Lobulated, fused and horseshoe kidney: Secondary | ICD-10-CM | POA: Insufficient documentation

## 2017-04-06 DIAGNOSIS — Z881 Allergy status to other antibiotic agents status: Secondary | ICD-10-CM | POA: Insufficient documentation

## 2017-04-06 DIAGNOSIS — D649 Anemia, unspecified: Secondary | ICD-10-CM | POA: Insufficient documentation

## 2017-04-06 DIAGNOSIS — M109 Gout, unspecified: Secondary | ICD-10-CM | POA: Diagnosis not present

## 2017-04-06 DIAGNOSIS — Z7982 Long term (current) use of aspirin: Secondary | ICD-10-CM | POA: Insufficient documentation

## 2017-04-06 DIAGNOSIS — M7989 Other specified soft tissue disorders: Secondary | ICD-10-CM | POA: Diagnosis not present

## 2017-04-06 DIAGNOSIS — R079 Chest pain, unspecified: Secondary | ICD-10-CM

## 2017-04-06 DIAGNOSIS — R7303 Prediabetes: Secondary | ICD-10-CM | POA: Diagnosis not present

## 2017-04-06 DIAGNOSIS — I2510 Atherosclerotic heart disease of native coronary artery without angina pectoris: Secondary | ICD-10-CM | POA: Insufficient documentation

## 2017-04-06 DIAGNOSIS — I130 Hypertensive heart and chronic kidney disease with heart failure and stage 1 through stage 4 chronic kidney disease, or unspecified chronic kidney disease: Secondary | ICD-10-CM | POA: Insufficient documentation

## 2017-04-06 DIAGNOSIS — Z87891 Personal history of nicotine dependence: Secondary | ICD-10-CM | POA: Insufficient documentation

## 2017-04-06 DIAGNOSIS — Z9889 Other specified postprocedural states: Secondary | ICD-10-CM | POA: Insufficient documentation

## 2017-04-06 DIAGNOSIS — Z951 Presence of aortocoronary bypass graft: Secondary | ICD-10-CM | POA: Diagnosis not present

## 2017-04-06 DIAGNOSIS — Z85118 Personal history of other malignant neoplasm of bronchus and lung: Secondary | ICD-10-CM | POA: Diagnosis not present

## 2017-04-06 DIAGNOSIS — H04123 Dry eye syndrome of bilateral lacrimal glands: Secondary | ICD-10-CM | POA: Diagnosis not present

## 2017-04-06 DIAGNOSIS — Z888 Allergy status to other drugs, medicaments and biological substances status: Secondary | ICD-10-CM | POA: Insufficient documentation

## 2017-04-06 LAB — CBC WITH DIFFERENTIAL/PLATELET
BASOS PCT: 0 %
Basophils Absolute: 0 10*3/uL (ref 0.0–0.1)
EOS ABS: 0.5 10*3/uL (ref 0.0–0.7)
Eosinophils Relative: 6 %
HCT: 31.6 % — ABNORMAL LOW (ref 39.0–52.0)
Hemoglobin: 10.1 g/dL — ABNORMAL LOW (ref 13.0–17.0)
Lymphocytes Relative: 25 %
Lymphs Abs: 2 10*3/uL (ref 0.7–4.0)
MCH: 30.6 pg (ref 26.0–34.0)
MCHC: 32 g/dL (ref 30.0–36.0)
MCV: 95.8 fL (ref 78.0–100.0)
Monocytes Absolute: 1.1 10*3/uL — ABNORMAL HIGH (ref 0.1–1.0)
Monocytes Relative: 14 %
NEUTROS PCT: 55 %
Neutro Abs: 4.5 10*3/uL (ref 1.7–7.7)
Platelets: 165 10*3/uL (ref 150–400)
RBC: 3.3 MIL/uL — AB (ref 4.22–5.81)
RDW: 15.1 % (ref 11.5–15.5)
WBC: 8.1 10*3/uL (ref 4.0–10.5)

## 2017-04-06 LAB — I-STAT TROPONIN, ED: Troponin i, poc: 0.02 ng/mL (ref 0.00–0.08)

## 2017-04-06 LAB — COMPREHENSIVE METABOLIC PANEL
ALBUMIN: 3.1 g/dL — AB (ref 3.5–5.0)
ANION GAP: 8 (ref 5–15)
AST: 22 U/L (ref 15–41)
Alkaline Phosphatase: 69 U/L (ref 38–126)
BUN: 47 mg/dL — ABNORMAL HIGH (ref 6–20)
CALCIUM: 8.5 mg/dL — AB (ref 8.9–10.3)
CHLORIDE: 105 mmol/L (ref 101–111)
CO2: 24 mmol/L (ref 22–32)
CREATININE: 2.14 mg/dL — AB (ref 0.61–1.24)
GFR calc Af Amer: 29 mL/min — ABNORMAL LOW (ref >60)
GFR calc non Af Amer: 25 mL/min — ABNORMAL LOW (ref >60)
GLUCOSE: 116 mg/dL — AB (ref 65–99)
Potassium: 3.9 mmol/L (ref 3.5–5.1)
SODIUM: 137 mmol/L (ref 135–145)
Total Bilirubin: 0.9 mg/dL (ref 0.3–1.2)
Total Protein: 5.9 g/dL — ABNORMAL LOW (ref 6.5–8.1)

## 2017-04-06 LAB — BRAIN NATRIURETIC PEPTIDE: B NATRIURETIC PEPTIDE 5: 383.9 pg/mL — AB (ref 0.0–100.0)

## 2017-04-06 MED ORDER — ALBUTEROL SULFATE (2.5 MG/3ML) 0.083% IN NEBU
5.0000 mg | INHALATION_SOLUTION | Freq: Once | RESPIRATORY_TRACT | Status: AC
  Administered 2017-04-06: 5 mg via RESPIRATORY_TRACT
  Filled 2017-04-06: qty 6

## 2017-04-06 MED ORDER — METHYLPREDNISOLONE SODIUM SUCC 125 MG IJ SOLR
125.0000 mg | Freq: Once | INTRAMUSCULAR | Status: AC
  Administered 2017-04-06: 125 mg via INTRAVENOUS
  Filled 2017-04-06: qty 2

## 2017-04-06 MED ORDER — FUROSEMIDE 10 MG/ML IJ SOLN
40.0000 mg | Freq: Once | INTRAMUSCULAR | Status: AC
  Administered 2017-04-07: 40 mg via INTRAVENOUS
  Filled 2017-04-06: qty 4

## 2017-04-06 NOTE — ED Provider Notes (Signed)
[facility] EMERGENCY DEPARTMENT Provider Note   CSN: [id number] Arrival date & time: 04/06/17  2123     History   Chief Complaint Chief Complaint  Patient presents with  . Shortness of Breath    HPI [name] is a 83 y.o. male history of CAD, CKD, COPD, lung cancer who presented with shortness of breath.  Patient had acute onset of shortness of breath several hours prior to arrival.  Patient has ongoing leg swelling as well.  He states that he tried to get up but was too short of breath to get up.  EMS noted that he was hypertensive and appears in moderate respiratory distress.   The history is provided by the patient.    Past Medical History:  Diagnosis Date  . ASCVD (arteriosclerotic cardiovascular disease)     CABG in 04/1993; and negative stress nuclear study in 08/2001  . Benign essential tremor   . CAD (coronary artery disease)   . Cancer ([place])    skin  . Cerebrovascular disease    Right carotid bruit-40-69% left internal carotid artery stenosis in 4/06; followed [facility]  . Chronic kidney disease, stage II (mild)    Creatinine-1.6 in 09/2008  . COPD (chronic obstructive pulmonary disease) ([place])   . Degenerative joint disease of knee, left   . Gout   . Horseshoe kidney   . Hyperlipidemia   . Hypertension   . Lung cancer ([place]) 2013  . Normocytic anemia   . Prediabetes   . Pulmonary disease   . Renal insufficiency   . Syncope   . Tobacco abuse, in remission    40 pack years; discontinued in 1980    Patient Active Problem List   Diagnosis Date Noted  . Parkinson's disease ([place]) 10/05/2016  . Diastolic CHF, chronic ([place]) 05/10/2015  . Dizziness   . Coronary artery disease involving coronary bypass graft of native heart without angina pectoris   . Near syncope 04/02/2015  . Diarrhea 04/02/2015  . COPD exacerbation ([place]) 06/04/2013  . SOB (shortness of breath) 06/04/2013  . Acute respiratory failure with hypoxia ([place]) 06/04/2013  .  Hemoptysis 08/10/2012  . Prediabetes 08/28/2011  . Lung cancer ([place]) 08/25/2011  . COPD (chronic obstructive pulmonary disease) ([place]) 08/25/2011  . CKD (chronic kidney disease), stage III ([place]) 12/07/2010  . Chest pain 08/18/2010  . Syncope   . Tobacco abuse, in remission   . Degenerative joint disease of knee, left   . COLONIC POLYPS 05/20/2009  . Hyperlipidemia 05/20/2009  . ANEMIA 05/20/2009  . Essential tremor 05/20/2009  . ATHEROSCLEROTIC CARDIOVASCULAR DISEASE 05/20/2009  . CEREBROVASCULAR DISEASE 05/20/2009  . Essential hypertension 01/14/2009    Past Surgical History:  Procedure Laterality Date  . CARDIAC SURGERY    . COLONOSCOPY W/ POLYPECTOMY  1985  . CORONARY ARTERY BYPASS GRAFT  1995  . LESION EXCISION    . PILONIDAL CYST EXCISION  1948       Home Medications    Prior to Admission medications   Medication Sig Start Date End Date Taking? Authorizing Provider  albuterol (VENTOLIN HFA) 108 (90 Base) MCG/ACT inhaler INHALE 2 PUFFS INTO THE LUNGS EVERY 6 HOURS AS NEEDED FOR SHORTNESS OF BREATH 07/13/16  Yes Kathyrn Drown, MD  Artificial Tear Solution (SOOTHE XP) SOLN Place 1-2 drops into both eyes 2 (two) times daily as needed (for dry eyes).    Yes [provider]  aspirin EC 81 MG tablet Take 81 mg by mouth daily.  Yes [provider]  atorvastatin (LIPITOR) 40 MG tablet TAKE 1 TABLET (40 MG TOTAL) BY MOUTH DAILY. 11/30/16  Yes Luking, Elayne Snare, MD  budesonide-formoterol (SYMBICORT) 160-4.5 MCG/ACT inhaler INHALE 2 PUFF INTO THE LUNGS 2 (TWO) TIMES DAILY. 07/13/16  Yes Kathyrn Drown, MD  carbidopa-levodopa (SINEMET IR) 25-100 MG tablet Take 1.5 tablets by mouth three times a day 10/01/16  Yes [provider]  docusate sodium (COLACE) 100 MG capsule Take 100 mg by mouth daily as needed for mild constipation.   Yes [provider]  Loperamide HCl (IMODIUM A-D PO) Take 1 tablet by mouth every 4 (four) hours as needed (for).    Yes  [provider]  metoprolol tartrate (LOPRESSOR) 50 MG tablet TAKE 1 TABLET BY MOUTH EVERY DAY 02/05/17  Yes Luking, Elayne Snare, MD  Multiple Vitamins-Minerals (CENTRUM SILVER 50+MEN) TABS Take 1 tablet by mouth daily.   Yes [provider]  simethicone (MYLICON) 80 MG chewable tablet Chew 80 mg by mouth every 6 (six) hours as needed for flatulence.   Yes [provider]  tamsulosin (FLOMAX) 0.4 MG CAPS capsule Take 1 capsule (0.4 mg total) by mouth at bedtime. 07/13/16  Yes Luking, Elayne Snare, MD  torsemide (DEMADEX) 20 MG tablet Take 1 tablet daily AS NEEDED FOR SEVERE LEG SWELLING. Patient taking differently: Take 20 mg by mouth daily.  08/12/16  Yes Branch, Alphonse Guild, MD  doxycycline (VIBRA-TABS) 100 MG tablet Take 1 tablet (100 mg total) by mouth 2 (two) times daily. 03/15/17   Kathyrn Drown, MD  fluticasone (FLONASE) 50 MCG/ACT nasal spray Place 1-2 sprays into the nose daily as needed.    [provider]  KLOR-CON M10 10 MEQ tablet TAKE 1 TABLET (10 MEQ TOTAL) BY MOUTH 2 (TWO) TIMES DAILY. 01/15/17   Kathyrn Drown, MD  Multiple Vitamin (MULTIVITAMIN WITH MINERALS) TABS Take 1 tablet by mouth daily.    [provider]  naphazoline-pheniramine (NAPHCON-A) 0.025-0.3 % ophthalmic solution Place 1 drop into both eyes daily as needed for irritation.    [provider]  nitroGLYCERIN (NITROSTAT) 0.4 MG SL tablet Place 1 tablet (0.4 mg total) under the tongue every 5 (five) minutes as needed for chest pain. 09/19/13   Kathyrn Drown, MD  predniSONE (DELTASONE) 20 MG tablet Take 3 qd for 2 days, then 2 qd for 2 days, then one qd for 2 days 03/15/17   Kathyrn Drown, MD  primidone (MYSOLINE) 50 MG tablet Take by mouth. 03/08/17 03/08/18  [provider]    Family History Family History  Problem Relation Age of Onset  . Cancer Brother   . Heart attack Brother     Social History Social History   Tobacco Use  . Smoking status: Former Smoker     Packs/day: 1.00    Years: 40.00    Pack years: 40.00    Types: Cigarettes    Last attempt to quit: 08/14/1973    Years since quitting: 43.6  . Smokeless tobacco: Former Systems developer    Types: [place] date: 12/05/1980  Substance Use Topics  . Alcohol use: No    Alcohol/week: 0.0 oz  . Drug use: No     Allergies   Propranolol; Levaquin [levofloxacin in d5w]; Penicillins; Sulfonamide derivatives; Sympathomimetics; and Tape   Review of Systems Review of Systems  Respiratory: Positive for shortness of breath.   All other systems reviewed and are negative.    Physical Exam Updated  Vital Signs BP (!) 177/63   Pulse 75   Temp (!) 97.4 F (36.3 C) (Oral)   Resp 19   SpO2 100%   Physical Exam  Constitutional:  Slightly tachypneic   HENT:  Head: Normocephalic.  Mouth/Throat: Oropharynx is clear and moist.  Eyes: EOM are normal. Pupils are equal, round, and reactive to light.  Neck: Normal range of motion.  Cardiovascular: Normal rate.  Pulmonary/Chest:  Tachypneic, + diffuse wheezing   Abdominal: Soft.  Musculoskeletal: Normal range of motion.  1+ edema  Neurological: He is alert.  Skin: Skin is warm. Capillary refill takes less than 2 seconds.  Nursing note and vitals reviewed.    ED Treatments / Results  Labs (all labs ordered are listed, but only abnormal results are displayed) Labs Reviewed  CBC WITH DIFFERENTIAL/PLATELET - Abnormal; Notable for the following components:      Result Value   RBC 3.30 (*)    Hemoglobin 10.1 (*)    HCT 31.6 (*)    Monocytes Absolute 1.1 (*)    All other components within normal limits  COMPREHENSIVE METABOLIC PANEL  BRAIN NATRIURETIC PEPTIDE  I-STAT TROPONIN, ED    EKG  EKG Interpretation  Date/Time:  Tuesday April 06 2017 21:27:52 EST Ventricular Rate:  76 PR Interval:    QRS Duration: 151 QT Interval:  446 QTC Calculation: 502 R Axis:   -126 Text Interpretation:  Sinus rhythm Supraventricular bigeminy  Probable left atrial enlargement Right bundle branch block No significant change since last tracing Confirmed by Wandra Arthurs ([id number]) on 04/06/2017 9:34:47 PM       Radiology Dg Chest Port 1 View  Result Date: 04/06/2017 CLINICAL DATA:  Shortness of breath and started last night EXAM: PORTABLE CHEST 1 VIEW COMPARISON:  04/10/2016 FINDINGS: COPD with hyperinflation. History of lung cancer with chronic post treatment appearance of the left apex. Normal heart size. Status post CABG. Stable mediastinal contours. No acute osseous finding. IMPRESSION: COPD and postoperative left apex.  No acute finding. Electronically Signed   By: Monte Fantasia M.D.   On: 04/06/2017 21:51    Procedures Procedures (including critical care time)  Medications Ordered in ED Medications  methylPREDNISolone sodium succinate (SOLU-MEDROL) 125 mg/2 mL injection 125 mg (not administered)  albuterol (PROVENTIL) (2.5 MG/3ML) 0.083% nebulizer solution 5 mg (5 mg Nebulization Given 04/06/17 2140)     Initial Impression / Assessment and Plan / ED Course  I have reviewed the triage vital signs and the nursing notes.  Pertinent labs & imaging results that were available during my care of the patient were reviewed by me and considered in my medical decision making (see chart for details).    GUILLAUME WENINGER is a 83 y.o. male here with SOB. Likely COPD vs CHF exacerbation. He has wheezing currently. Will get labs, CXR. Will give nebs, steroids.   10:58 PM CXR showed COPD. BNP slightly elevated. Given nebs, steroids, felt better. Will give lasix as well. Will admit.    Final Clinical Impressions(s) / ED Diagnoses   Final diagnoses:  None    ED Discharge Orders    None       Drenda Freeze, MD 04/06/17 2259

## 2017-04-06 NOTE — ED Triage Notes (Signed)
Pt arrived [facility] for c/o shortness of breath that started tonight. Per EMS he was found to be pale and diaphoretic. Symptoms resolved with 2L [facility] vitals BP 187/70 P 69 RR18 O2 100% 18GLAC

## 2017-04-06 NOTE — H&P (Signed)
[facility]                                                                                    Patient Demographics  [name], is a 83 y.o. male  CSN: [id number]  MRN: [id number]  DOB - 05-19-1921  Admit Date - 04/06/2017  Outpatient Primary MD for the patient is Kathyrn Drown, MD   With History of -  Past Medical History:  Diagnosis Date  . ASCVD (arteriosclerotic cardiovascular disease)     CABG in 04/1993; and negative stress nuclear study in 08/2001  . Benign essential tremor   . CAD (coronary artery disease)   . Cancer ([place])    skin  . Cerebrovascular disease    Right carotid bruit-40-69% left internal carotid artery stenosis in 4/06; followed [facility]  . Chronic kidney disease, stage II (mild)    Creatinine-1.6 in 09/2008  . COPD (chronic obstructive pulmonary disease) ([place])   . Degenerative joint disease of knee, left   . Gout   . Horseshoe kidney   . Hyperlipidemia   . Hypertension   . Lung cancer ([place]) 2013  . Normocytic anemia   . Prediabetes   . Pulmonary disease   . Renal insufficiency   . Syncope   . Tobacco abuse, in remission    40 pack years; discontinued in 1980      Past Surgical History:  Procedure Laterality Date  . CARDIAC SURGERY    . COLONOSCOPY W/ POLYPECTOMY  1985  . CORONARY ARTERY BYPASS GRAFT  1995  . LESION EXCISION    . PILONIDAL CYST EXCISION  1948    in for   Chief Complaint  Patient presents with  . Shortness of Breath     HPI  [name]  is a 83 y.o. male, with past medical history significant for coronary artery disease status post CABG in the past, COPD, and Parkinson's, presenting today with acute onset of shortness of breath, chest discomfort, cold sweats. No nausea or vomiting. No palpitations. Patient reports not feeling well since yesterday. Reports chronic dry cough that he attributes to COPD    Review of Systems    In addition to the HPI above,  No Fever-chills, No Headache, No changes with  Vision or hearing, No problems swallowing food or Liquids, No Abdominal pain, No Nausea or Vommitting, Bowel movements are regular, No Blood in stool or Urine, No dysuria, No new skin rashes or bruises, No new joints pains-aches,  No new weakness, tingling, numbness in any extremity, No recent weight gain or loss, No polyuria, polydypsia or polyphagia, No significant Mental Stressors.  A full 10 point Review of Systems was done, except as stated above, all other Review of Systems were negative.   Social History Social History   Tobacco Use  . Smoking status: Former Smoker    Packs/day: 1.00    Years: 40.00    Pack years: 40.00    Types: Cigarettes    Last attempt to quit: 08/14/1973    Years since quitting: 43.6  . Smokeless tobacco: Former Systems developer    Types: [place] date: 12/05/1980  Substance Use Topics  .  Alcohol use: No    Alcohol/week: 0.0 oz     Family History Family History  Problem Relation Age of Onset  . Cancer Brother   . Heart attack Brother      Prior to Admission medications   Medication Sig Start Date End Date Taking? Authorizing Provider  albuterol (VENTOLIN HFA) 108 (90 Base) MCG/ACT inhaler INHALE 2 PUFFS INTO THE LUNGS EVERY 6 HOURS AS NEEDED FOR SHORTNESS OF BREATH 07/13/16  Yes Kathyrn Drown, MD  Artificial Tear Solution (SOOTHE XP) SOLN Place 1-2 drops into both eyes 2 (two) times daily as needed (for dry eyes).    Yes [provider]  aspirin EC 81 MG tablet Take 81 mg by mouth daily.   Yes [provider]  atorvastatin (LIPITOR) 40 MG tablet TAKE 1 TABLET (40 MG TOTAL) BY MOUTH DAILY. Patient taking differently: Take 40 mg by mouth once a day 11/30/16  Yes Luking, Elayne Snare, MD  budesonide-formoterol (SYMBICORT) 160-4.5 MCG/ACT inhaler INHALE 2 PUFF INTO THE LUNGS 2 (TWO) TIMES DAILY. Patient taking differently: Inhale 2 puffs into the lungs 2 (two) times daily.  07/13/16  Yes Kathyrn Drown, MD  carbidopa-levodopa (SINEMET IR)  25-100 MG tablet Take 1.5 tablets by mouth three times a day at 0600/1200/1730 10/01/16  Yes [provider]  docusate sodium (COLACE) 100 MG capsule Take 100 mg by mouth daily as needed for mild constipation.   Yes [provider]  fluticasone (FLONASE) 50 MCG/ACT nasal spray Place 1-2 sprays into both nostrils daily as needed for allergies or rhinitis.    Yes [provider]  Loperamide HCl (IMODIUM A-D PO) Take 1 tablet by mouth every 4 (four) hours as needed (for).    Yes [provider]  metoprolol tartrate (LOPRESSOR) 50 MG tablet TAKE 1 TABLET BY MOUTH EVERY DAY Patient taking differently: Take 50 mg by mouth once a day 02/05/17  Yes Luking, Elayne Snare, MD  Multiple Vitamins-Minerals (CENTRUM SILVER 50+MEN) TABS Take 1 tablet by mouth daily.   Yes [provider]  simethicone (MYLICON) 80 MG chewable tablet Chew 80 mg by mouth every 6 (six) hours as needed for flatulence.   Yes [provider]  tamsulosin (FLOMAX) 0.4 MG CAPS capsule Take 1 capsule (0.4 mg total) by mouth at bedtime. 07/13/16  Yes Luking, Elayne Snare, MD  torsemide (DEMADEX) 20 MG tablet Take 1 tablet daily AS NEEDED FOR SEVERE LEG SWELLING. Patient taking differently: Take 20 mg by mouth daily.  08/12/16  Yes Branch, Alphonse Guild, MD  doxycycline (VIBRA-TABS) 100 MG tablet Take 1 tablet (100 mg total) by mouth 2 (two) times daily. Patient not taking: Reported on 04/06/2017 03/15/17   Kathyrn Drown, MD  KLOR-CON M10 10 MEQ tablet TAKE 1 TABLET (10 MEQ TOTAL) BY MOUTH 2 (TWO) TIMES DAILY. Patient not taking: Reported on 04/06/2017 01/15/17   Kathyrn Drown, MD  nitroGLYCERIN (NITROSTAT) 0.4 MG SL tablet Place 1 tablet (0.4 mg total) under the tongue every 5 (five) minutes as needed for chest pain. Patient not taking: Reported on 04/06/2017 09/19/13   Kathyrn Drown, MD  predniSONE (DELTASONE) 20 MG tablet Take 3 qd for 2 days, then 2 qd for 2 days, then one qd for 2 days Patient not  taking: Reported on 04/06/2017 03/15/17   Kathyrn Drown, MD  primidone (MYSOLINE) 50 MG tablet Take 200 mg by mouth at bedtime.  03/08/17 03/08/18  [provider]    Allergies  Allergen  Reactions  . Propranolol Nausea Only  . Levaquin [Levofloxacin In D5w] Hives and Nausea And Vomiting  . Penicillins Hives and Swelling    Has patient had a PCN reaction causing immediate rash, facial/tongue/throat swelling, SOB or lightheadedness with hypotension: Yes Has patient had a PCN reaction causing severe rash involving mucus membranes or skin necrosis: No Has patient had a PCN reaction that required hospitalization No Has patient had a PCN reaction occurring within the last 10 years: No If all of the above answers are "NO", then may proceed with Cephalosporin use.   . Sulfonamide Derivatives Hives and Swelling  . Sympathomimetics Other (See Comments)  . Tape Other (See Comments)    SKIN IS VERY THIN AND TEARS EASILY; PLEASE USE AN ALTERNATIVE TO TAPE!!    Physical Exam  Vitals  Blood pressure (!) 141/92, pulse 98, temperature (!) 97.4 F (36.3 C), temperature source Oral, resp. rate 17, SpO2 100 %.   1. General very pleasant, elderly male, well developed, well nourished  2. Normal affect and insight, Not Suicidal or Homicidal, Awake Alert, Oriented X 3.  3. No F.N deficits, grossly, patient moving all extremities.  4. Ears and Eyes appear Normal, Conjunctivae clear, PERRLA. Moist Oral Mucosa.  5. Supple Neck, No JVD, No cervical lymphadenopathy appriciated, No Carotid Bruits.  6. Symmetrical Chest wall movement, scattered rhonchi and wheezing.  7. Irregular, No Gallops, Rubs or Murmurs, No Parasternal Heave.  8. Positive Bowel Sounds, Abdomen Soft, Non tender, No organomegaly appriciated,No rebound -guarding or rigidity.  9.  No Cyanosis, Normal Skin Turgor, No Skin Rash or Bruise.  10. Good muscle tone,  joints appear normal , no effusions, Normal ROM.   Data  Review  CBC Recent Labs  Lab 04/06/17 2130  WBC 8.1  HGB 10.1*  HCT 31.6*  PLT 165  MCV 95.8  MCH 30.6  MCHC 32.0  RDW 15.1  LYMPHSABS 2.0  MONOABS 1.1*  EOSABS 0.5  BASOSABS 0.0   ------------------------------------------------------------------------------------------------------------------  Chemistries  Recent Labs  Lab 04/06/17 2130  NA 137  K 3.9  CL 105  CO2 24  GLUCOSE 116*  BUN 47*  CREATININE 2.14*  CALCIUM 8.5*  AST 22  ALT <5*  ALKPHOS 69  BILITOT 0.9   ------------------------------------------------------------------------------------------------------------------ CrCl cannot be calculated (Unknown ideal weight.). ------------------------------------------------------------------------------------------------------------------ No results for input(s): TSH, T4TOTAL, T3FREE, THYROIDAB in the last 72 hours.  Invalid input(s): FREET3   Coagulation profile No results for input(s): INR, PROTIME in the last 168 hours. ------------------------------------------------------------------------------------------------------------------- No results for input(s): DDIMER in the last 72 hours. -------------------------------------------------------------------------------------------------------------------  Cardiac Enzymes No results for input(s): CKMB, TROPONINI, MYOGLOBIN in the last 168 hours.  Invalid input(s): CK ------------------------------------------------------------------------------------------------------------------ Invalid input(s): POCBNP   ---------------------------------------------------------------------------------------------------------------  Urinalysis    Component Value Date/Time   COLORURINE YELLOW 05/14/2016 1318   APPEARANCEUR CLEAR 05/14/2016 1318   LABSPEC 1.005 05/14/2016 1318   PHURINE 6.0 05/14/2016 1318   GLUCOSEU NEGATIVE 05/14/2016 1318   HGBUR NEGATIVE 05/14/2016 1318   BILIRUBINUR NEGATIVE 05/14/2016 1318    KETONESUR NEGATIVE 05/14/2016 1318   PROTEINUR NEGATIVE 05/14/2016 1318   UROBILINOGEN 0.2 10/21/2013 1655   NITRITE NEGATIVE 05/14/2016 1318   LEUKOCYTESUR NEGATIVE 05/14/2016 1318    ----------------------------------------------------------------------------------------------------------------   Imaging results:   US Arterial Seg Multiple Le (abi, Segmental Pressures, Pvr's)  Result Date: 03/16/2017 CLINICAL DATA:  83 year old male with a history of vascular disease. Cardiovascular risk factors include smoking, known vascular disease with vascular surgery EXAM: NONINVASIVE PHYSIOLOGIC VASCULAR STUDY OF BILATERAL LOWER  EXTREMITIES TECHNIQUE: Evaluation of both lower extremities was performed at rest, including calculation of ankle-brachial indices, multiple segmental pressure evaluation, segmental Doppler and segmental pulse volume recording. COMPARISON:  None. FINDINGS: Right: Resting ankle brachial index:  Noncompressible Segmental blood pressure: Upper extremity pressures are symmetric. Appropriate increased to the thigh. Doppler: Segmental Doppler of the right lower extremity demonstrates abnormal common femoral waveform with superimposed artifact, and biphasic waveform of the SFA, popliteal, and tibial vessels. Pulse volume recording: Segmental PVR of the right lower extremity demonstrates decreased amplitude of the thigh waveform with augmentation maintained. No significant deterioration of the metatarsal segment. Left: Resting ankle brachial index: 0.96 Segmental blood pressure: Upper extremity pressures are symmetric. Appropriate increased to the thigh Doppler: Segmental Doppler of the right lower extremity demonstrates abnormal common femoral artery waveform with superimposed artifact. Biphasic waveform of the SFA. Monophasic waveform of the popliteal artery and tibial vessels. Pulse volume recording: Segmental PVR of the left lower extremity demonstrates amplitude maintained in the thigh  with loss of augmentation below knee. Further deterioration at the ankle metatarsal segment. IMPRESSION: Right: Resting ABI of the right lower extremity is noncompressible. Segmental exam on the right confirms suggests flow within the tibial vessels, although compromised secondary to developing iliac, femoral-popliteal, and tibial disease. Left: Resting ABI of the left lower extremity within normal limits, however, would likely decrease after exercise exam. Segmental exam demonstrates evidence of developing iliac disease and likely femoral popliteal high-grade stenosis or occlusion. Signed, Dulcy Fanny. Earleen Newport, DO [facility] Radiology Electronically Signed   By: Corrie Mckusick D.O.   On: 03/16/2017 12:48   Dg Chest Port 1 View  Result Date: 04/06/2017 CLINICAL DATA:  Shortness of breath and started last night EXAM: PORTABLE CHEST 1 VIEW COMPARISON:  04/10/2016 FINDINGS: COPD with hyperinflation. History of lung cancer with chronic post treatment appearance of the left apex. Normal heart size. Status post CABG. Stable mediastinal contours. No acute osseous finding. IMPRESSION: COPD and postoperative left apex.  No acute finding. Electronically Signed   By: Monte Fantasia M.D.   On: 04/06/2017 21:51   Dg Foot Complete Left  Result Date: 03/30/2017 Please see detailed radiograph report in office note.   My personal review of EKG: Normal sinus rhythm with supraventricular bigeminy at 76 bpm, right bundle branch block ? LAE Assessment & Plan  1. COPD exacerbation versus flash pulmonary edema not showing on chest x-ray 2. Chest pain with a convincing story 3. History of coronary artery disease status post CABG 4. Parkinson's disease  Plan Place in observation IV Lasix IV sodium Medrol Neb treatments Oxygen Check serial troponins    DVT Prophylaxis Lovenox  AM Labs Ordered, also please review Full Orders  Family Communication: Admission,  patients condition and plan of care including tests being ordered have been discussed with the patient and family who indicate understanding and agree with the plan and Code Status.  Code Status full  Disposition Plan: Home  Time spent in minutes : 36 minutes  Condition GUARDED   @SIGNATURE @

## 2017-04-07 ENCOUNTER — Other Ambulatory Visit: Payer: Self-pay

## 2017-04-07 ENCOUNTER — Ambulatory Visit (HOSPITAL_BASED_OUTPATIENT_CLINIC_OR_DEPARTMENT_OTHER): Payer: Medicare Other

## 2017-04-07 ENCOUNTER — Encounter (HOSPITAL_COMMUNITY): Payer: Self-pay

## 2017-04-07 DIAGNOSIS — I5030 Unspecified diastolic (congestive) heart failure: Secondary | ICD-10-CM

## 2017-04-07 DIAGNOSIS — I130 Hypertensive heart and chronic kidney disease with heart failure and stage 1 through stage 4 chronic kidney disease, or unspecified chronic kidney disease: Secondary | ICD-10-CM | POA: Diagnosis not present

## 2017-04-07 DIAGNOSIS — N183 Chronic kidney disease, stage 3 (moderate): Secondary | ICD-10-CM | POA: Diagnosis not present

## 2017-04-07 DIAGNOSIS — I5033 Acute on chronic diastolic (congestive) heart failure: Secondary | ICD-10-CM

## 2017-04-07 DIAGNOSIS — I5032 Chronic diastolic (congestive) heart failure: Secondary | ICD-10-CM | POA: Diagnosis not present

## 2017-04-07 DIAGNOSIS — J441 Chronic obstructive pulmonary disease with (acute) exacerbation: Secondary | ICD-10-CM | POA: Diagnosis not present

## 2017-04-07 LAB — BASIC METABOLIC PANEL
ANION GAP: 10 (ref 5–15)
BUN: 46 mg/dL — AB (ref 6–20)
CALCIUM: 8.7 mg/dL — AB (ref 8.9–10.3)
CO2: 23 mmol/L (ref 22–32)
CREATININE: 2.17 mg/dL — AB (ref 0.61–1.24)
Chloride: 104 mmol/L (ref 101–111)
GFR calc Af Amer: 28 mL/min — ABNORMAL LOW (ref >60)
GFR, EST NON AFRICAN AMERICAN: 25 mL/min — AB (ref >60)
GLUCOSE: 178 mg/dL — AB (ref 65–99)
Potassium: 3.6 mmol/L (ref 3.5–5.1)
Sodium: 137 mmol/L (ref 135–145)

## 2017-04-07 LAB — URIC ACID: Uric Acid, Serum: 9.2 mg/dL — ABNORMAL HIGH (ref 4.4–7.6)

## 2017-04-07 LAB — TROPONIN I
TROPONIN I: 0.05 ng/mL — AB (ref <0.03)
Troponin I: <0.03 ng/mL (ref <0.03)
Troponin I: 0.04 ng/mL (ref <0.03)

## 2017-04-07 LAB — ECHOCARDIOGRAM COMPLETE
HEIGHTINCHES: 67 in
Weight: 2105.83 oz

## 2017-04-07 MED ORDER — ASPIRIN EC 81 MG PO TBEC
81.0000 mg | DELAYED_RELEASE_TABLET | Freq: Every day | ORAL | Status: DC
  Administered 2017-04-07 – 2017-04-08 (×2): 81 mg via ORAL
  Filled 2017-04-07 (×2): qty 1

## 2017-04-07 MED ORDER — PRIMIDONE 50 MG PO TABS: 200.0000 mg | ORAL_TABLET | Freq: Every day | ORAL | Status: DC

## 2017-04-07 MED ORDER — ACETAMINOPHEN 650 MG RE SUPP: 650.0000 mg | Freq: Four times a day (QID) | RECTAL | Status: DC | PRN

## 2017-04-07 MED ORDER — IPRATROPIUM BROMIDE 0.02 % IN SOLN
0.5000 mg | Freq: Four times a day (QID) | RESPIRATORY_TRACT | Status: DC
  Administered 2017-04-07: 0.5 mg via RESPIRATORY_TRACT
  Filled 2017-04-07: qty 2.5

## 2017-04-07 MED ORDER — ALBUTEROL SULFATE (2.5 MG/3ML) 0.083% IN NEBU
2.5000 mg | INHALATION_SOLUTION | Freq: Four times a day (QID) | RESPIRATORY_TRACT | Status: DC
  Administered 2017-04-07: 2.5 mg via RESPIRATORY_TRACT
  Filled 2017-04-07: qty 3

## 2017-04-07 MED ORDER — CARBIDOPA-LEVODOPA 25-100 MG PO TABS
1.5000 | ORAL_TABLET | Freq: Three times a day (TID) | ORAL | Status: DC
  Administered 2017-04-07 – 2017-04-08 (×5): 1.5 via ORAL
  Filled 2017-04-07 (×5): qty 2

## 2017-04-07 MED ORDER — IPRATROPIUM-ALBUTEROL 0.5-2.5 (3) MG/3ML IN SOLN
3.0000 mL | Freq: Two times a day (BID) | RESPIRATORY_TRACT | Status: DC
  Administered 2017-04-07 – 2017-04-08 (×2): 3 mL via RESPIRATORY_TRACT
  Filled 2017-04-07 (×2): qty 3

## 2017-04-07 MED ORDER — ACETAMINOPHEN 325 MG PO TABS: 650.0000 mg | ORAL_TABLET | Freq: Four times a day (QID) | ORAL | Status: DC | PRN

## 2017-04-07 MED ORDER — TAMSULOSIN HCL 0.4 MG PO CAPS
0.4000 mg | ORAL_CAPSULE | Freq: Every day | ORAL | Status: DC
  Administered 2017-04-07: 0.4 mg via ORAL
  Filled 2017-04-07: qty 1

## 2017-04-07 MED ORDER — POLYVINYL ALCOHOL 1.4 % OP SOLN
1.0000 [drp] | Freq: Two times a day (BID) | OPHTHALMIC | Status: DC | PRN
  Filled 2017-04-07: qty 15

## 2017-04-07 MED ORDER — FLUTICASONE PROPIONATE 50 MCG/ACT NA SUSP
1.0000 | Freq: Every day | NASAL | Status: DC | PRN
  Filled 2017-04-07: qty 16

## 2017-04-07 MED ORDER — TORSEMIDE 20 MG PO TABS
20.0000 mg | ORAL_TABLET | Freq: Every day | ORAL | Status: DC
  Administered 2017-04-08: 20 mg via ORAL
  Filled 2017-04-07: qty 1

## 2017-04-07 MED ORDER — FUROSEMIDE 10 MG/ML IJ SOLN
40.0000 mg | Freq: Two times a day (BID) | INTRAMUSCULAR | Status: DC
  Administered 2017-04-07: 40 mg via INTRAVENOUS
  Filled 2017-04-07: qty 4

## 2017-04-07 MED ORDER — ALBUTEROL SULFATE (2.5 MG/3ML) 0.083% IN NEBU
2.5000 mg | INHALATION_SOLUTION | RESPIRATORY_TRACT | Status: DC | PRN
Start: 2017-04-07 — End: 2017-04-08

## 2017-04-07 MED ORDER — HYDROCODONE-ACETAMINOPHEN 5-325 MG PO TABS: 1.0000 | ORAL_TABLET | ORAL | Status: DC | PRN

## 2017-04-07 MED ORDER — DOCUSATE SODIUM 100 MG PO CAPS: 100.0000 mg | ORAL_CAPSULE | Freq: Every day | ORAL | Status: DC | PRN

## 2017-04-07 MED ORDER — MOMETASONE FURO-FORMOTEROL FUM 200-5 MCG/ACT IN AERO
2.0000 | INHALATION_SPRAY | Freq: Two times a day (BID) | RESPIRATORY_TRACT | Status: DC
  Administered 2017-04-07 – 2017-04-08 (×3): 2 via RESPIRATORY_TRACT
  Filled 2017-04-07: qty 8.8

## 2017-04-07 MED ORDER — ALBUTEROL SULFATE (2.5 MG/3ML) 0.083% IN NEBU: 2.5000 mg | INHALATION_SOLUTION | Freq: Two times a day (BID) | RESPIRATORY_TRACT | Status: DC

## 2017-04-07 MED ORDER — IPRATROPIUM BROMIDE 0.02 % IN SOLN: 0.5000 mg | Freq: Two times a day (BID) | RESPIRATORY_TRACT | Status: DC

## 2017-04-07 MED ORDER — SODIUM CHLORIDE 0.9% FLUSH
3.0000 mL | Freq: Two times a day (BID) | INTRAVENOUS | Status: DC
  Administered 2017-04-07 – 2017-04-08 (×4): 3 mL via INTRAVENOUS

## 2017-04-07 MED ORDER — ONDANSETRON HCL 4 MG/2ML IJ SOLN: 4.0000 mg | Freq: Four times a day (QID) | INTRAMUSCULAR | Status: DC | PRN

## 2017-04-07 MED ORDER — ATORVASTATIN CALCIUM 40 MG PO TABS
40.0000 mg | ORAL_TABLET | Freq: Every day | ORAL | Status: DC
  Administered 2017-04-07: 40 mg via ORAL
  Filled 2017-04-07: qty 1

## 2017-04-07 MED ORDER — PREDNISONE 20 MG PO TABS
30.0000 mg | ORAL_TABLET | Freq: Every day | ORAL | Status: DC
  Administered 2017-04-08: 08:00:00 30 mg via ORAL
  Filled 2017-04-07: qty 1

## 2017-04-07 MED ORDER — SODIUM CHLORIDE 0.9 % IV SOLN: 250.0000 mL | INTRAVENOUS | Status: DC | PRN

## 2017-04-07 MED ORDER — SIMETHICONE 80 MG PO CHEW: 80.0000 mg | CHEWABLE_TABLET | Freq: Four times a day (QID) | ORAL | Status: DC | PRN

## 2017-04-07 MED ORDER — ADULT MULTIVITAMIN W/MINERALS CH
1.0000 | ORAL_TABLET | Freq: Every day | ORAL | Status: DC
  Administered 2017-04-07 – 2017-04-08 (×2): 1 via ORAL
  Filled 2017-04-07 (×2): qty 1

## 2017-04-07 MED ORDER — ZOLPIDEM TARTRATE 5 MG PO TABS
5.0000 mg | ORAL_TABLET | Freq: Every evening | ORAL | Status: DC | PRN
  Administered 2017-04-07: 5 mg via ORAL
  Filled 2017-04-07: qty 1

## 2017-04-07 MED ORDER — METOPROLOL TARTRATE 25 MG PO TABS
25.0000 mg | ORAL_TABLET | Freq: Two times a day (BID) | ORAL | Status: DC
  Administered 2017-04-07 – 2017-04-08 (×3): 25 mg via ORAL
  Filled 2017-04-07 (×3): qty 1

## 2017-04-07 MED ORDER — METHYLPREDNISOLONE SODIUM SUCC 125 MG IJ SOLR
60.0000 mg | Freq: Four times a day (QID) | INTRAMUSCULAR | Status: DC
  Administered 2017-04-07 (×2): 60 mg via INTRAVENOUS
  Filled 2017-04-07 (×2): qty 2

## 2017-04-07 MED ORDER — SOOTHE XP OP SOLN: 1.0000 [drp] | Freq: Two times a day (BID) | OPHTHALMIC | Status: DC | PRN

## 2017-04-07 MED ORDER — ENOXAPARIN SODIUM 30 MG/0.3ML ~~LOC~~ SOLN
30.0000 mg | SUBCUTANEOUS | Status: DC
  Administered 2017-04-07 – 2017-04-08 (×2): 30 mg via SUBCUTANEOUS
  Filled 2017-04-07 (×2): qty 0.3

## 2017-04-07 MED ORDER — ONDANSETRON HCL 4 MG PO TABS: 4.0000 mg | ORAL_TABLET | Freq: Four times a day (QID) | ORAL | Status: DC | PRN

## 2017-04-07 MED ORDER — SODIUM CHLORIDE 0.9% FLUSH: 3.0000 mL | INTRAVENOUS | Status: DC | PRN

## 2017-04-07 NOTE — Progress Notes (Signed)
Pt's son requests dietitian to meet with his father to educate on a healthy diet. RN placed order for dietitian consult.

## 2017-04-07 NOTE — ED Notes (Addendum)
Attempted report x 1 to 3E told by Zenia Resides to call back in 20 minutes.

## 2017-04-07 NOTE — Progress Notes (Signed)
  Echocardiogram 2D Echocardiogram has been performed.  [name] 04/07/2017, 2:55 PM

## 2017-04-07 NOTE — Progress Notes (Signed)
Patient arrived at approximately 0045 alert and oriented x 4 and without c/o pain or discomfort and with many family members.

## 2017-04-07 NOTE — Progress Notes (Signed)
CRITICAL VALUE ALERT  Critical Value:  Troponin 0.04  Date & Time Notied:  04/07/17  0739  Provider Notified: yes  Orders Received/Actions taken: no new orders

## 2017-04-07 NOTE — Progress Notes (Signed)
Patient noted to have tremors of the hands upon admission and his sacral area was red but blanchable.

## 2017-04-07 NOTE — Progress Notes (Signed)
PROGRESS NOTE   [name]  JIR:[id number]    DOB: 1921-05-20    DOA: 04/06/2017  PCP: Kathyrn Drown, MD   I have briefly reviewed patients previous medical records in [facility].  Brief Narrative:  83 year old married male, lives with his spouse and adult son, ambulates occasionally with help of a cane, PMH of lung cancer, COPD not on home oxygen, stage III chronic kidney disease, CAD status post CABG, former tobacco abuse, gout, HTN, HLD, normocytic anemia, Parkinson's disease, presented to the ED 04/06/17 with subacute onset of dyspnea, chronic right leg swelling (had surgery in that leg) and generalized weakness. EMS noted him to be hypertensive with moderate respiratory distress. Admitted for possible COPD versus CHF exacerbation. He was wheezing in ED. Received IV Lasix and COPD treatment.   Assessment & Plan:   Active Problems:   COPD exacerbation ([place])   1. Suspected acute COPD exacerbation versus acute on chronic diastolic CHF: Treated with IV Lasix 40 mg every 12 hours, IV Solu-Medrol 60 mg every 6 hours, bronchodilator nebulizations. Clinically improved. Breathing almost back to baseline. No hypoxia today. DC IV Lasix and changed to by mouth in a.m. Reduce steroids. Monitor overnight for recurrent worsening. Repeat 2-D echo, last one was in 2016 showed normal EF. Not suspicious for infectious exacerbation of COPD. No antibiotics started. 2. Minimal troponin elevation: Likely demand ischemia related to problem #1. No chest pain reported. Check 2-D echo. Continue aspirin, statins and beta blockers. 3. CAD status post CABG: No chest pain. Low index of suspicion for ischemia. Continue aspirin, statins, beta blockers and follow 2-D echo. 4. Stage III chronic kidney disease: Stable. 5. Normocytic anemia: Likely chronic disease. Stable. Outpatient follow-up. 6. History of gout/hyperuricemia: Son requests repeat uric acid level which patient was supposed to have with his PCP  tomorrow. Added to a.m. labs. No acute flare. 7. Weight loss: Patient apparently lost 8 pounds over the last week or so. Suspected due to poor oral hydration. He apparently gained some weight. Outpatient follow-up. 8. Parkinson's disease: Follows with [facility]. 9. Essential hypertension: Controlled. 10. History of lung cancer:   DVT prophylaxis: Lovenox Code Status: Full Family Communication: Discussed in detail with patient's son. Updated care and answered questions. Disposition: DC home, possibly 4/27.   Consultants:  None   Procedures:  None  Antimicrobials:  None    Subjective: Feels much better. Dyspnea improved and breathing almost back to baseline. No chest pain. Denies cough, fever or chills.  ROS: As above  Objective:  Vitals:   04/07/17 0042 04/07/17 0425 04/07/17 0815 04/07/17 0818  BP: (!) 161/60 (!) 136/50  (!) 128/54  Pulse: 99 97  93  Resp: 18 17    Temp: 98.2 F (36.8 C) 98 F (36.7 C)    TempSrc: Oral Oral    SpO2: 100% 97% 99% 99%  Weight: 59.7 kg (131 lb 9.8 oz) 59.7 kg (131 lb 9.8 oz)    Height: 5\' 7"  (1.702 m)       Examination:  General exam: Pleasant elderly male lying comfortably supine in bed. Respiratory system: Clear to auscultation. Respiratory effort normal. Cardiovascular system: S1 & S2 heard, RRR. No JVD, murmurs, rubs, gallops or clicks. Chronic right leg trace edema. Has long surgical scar from bypass harvesting.  Gastrointestinal system: Abdomen is nondistended, soft and nontender. No organomegaly or masses felt. Normal bowel sounds heard. Central nervous system: Alert and oriented. No focal neurological deficits. Extremities: Symmetric 5 x 5  power. Skin: No rashes, lesions or ulcers Psychiatry: Judgement and insight appear normal. Mood & affect appropriate.     Data Reviewed: I have personally reviewed following labs and imaging studies  CBC: Recent Labs  Lab 04/06/17 2130  WBC 8.1  NEUTROABS  4.5  HGB 10.1*  HCT 31.6*  MCV 95.8  PLT 144   Basic Metabolic Panel: Recent Labs  Lab 04/06/17 2130 04/07/17 0159  NA 137 137  K 3.9 3.6  CL 105 104  CO2 24 23  GLUCOSE 116* 178*  BUN 47* 46*  CREATININE 2.14* 2.17*  CALCIUM 8.5* 8.7*   Liver Function Tests: Recent Labs  Lab 04/06/17 2130  AST 22  ALT <5*  ALKPHOS 69  BILITOT 0.9  PROT 5.9*  ALBUMIN 3.1*    Cardiac Enzymes: Recent Labs  Lab 04/07/17 0159 04/07/17 0611  TROPONINI <0.03 0.04*       Radiology Studies: Dg Chest Port 1 View  Result Date: 04/06/2017 CLINICAL DATA:  Shortness of breath and started last night EXAM: PORTABLE CHEST 1 VIEW COMPARISON:  04/10/2016 FINDINGS: COPD with hyperinflation. History of lung cancer with chronic post treatment appearance of the left apex. Normal heart size. Status post CABG. Stable mediastinal contours. No acute osseous finding. IMPRESSION: COPD and postoperative left apex.  No acute finding. Electronically Signed   By: Monte Fantasia M.D.   On: 04/06/2017 21:51        Scheduled Meds: . albuterol  2.5 mg Nebulization BID  . aspirin EC  81 mg Oral Daily  . atorvastatin  40 mg Oral q1800  . carbidopa-levodopa  1.5 tablet Oral TID  . enoxaparin (LOVENOX) injection  30 mg Subcutaneous Q24H  . furosemide  40 mg Intravenous Q12H  . ipratropium  0.5 mg Nebulization BID  . methylPREDNISolone (SOLU-MEDROL) injection  60 mg Intravenous Q6H  . metoprolol tartrate  25 mg Oral BID  . mometasone-formoterol  2 puff Inhalation BID  . multivitamin with minerals  1 tablet Oral Daily  . sodium chloride flush  3 mL Intravenous Q12H  . tamsulosin  0.4 mg Oral QHS   Continuous Infusions: . sodium chloride       LOS: 0 days     Vernell Leep, MD, FACP, [facility]. [facility] Pager [phone]  If 7PM-7AM, please contact night-coverage [email or web address] Password [email or web address] 04/07/2017, 2:01 PM

## 2017-04-08 ENCOUNTER — Telehealth: Payer: Self-pay | Admitting: Family Medicine

## 2017-04-08 ENCOUNTER — Ambulatory Visit: Payer: Medicare Other | Admitting: Family Medicine

## 2017-04-08 DIAGNOSIS — J441 Chronic obstructive pulmonary disease with (acute) exacerbation: Secondary | ICD-10-CM | POA: Diagnosis not present

## 2017-04-08 DIAGNOSIS — N183 Chronic kidney disease, stage 3 (moderate): Secondary | ICD-10-CM | POA: Diagnosis not present

## 2017-04-08 DIAGNOSIS — I5033 Acute on chronic diastolic (congestive) heart failure: Secondary | ICD-10-CM | POA: Diagnosis not present

## 2017-04-08 LAB — BASIC METABOLIC PANEL
ANION GAP: 9 (ref 5–15)
BUN: 51 mg/dL — AB (ref 6–20)
CALCIUM: 8.7 mg/dL — AB (ref 8.9–10.3)
CO2: 27 mmol/L (ref 22–32)
Chloride: 103 mmol/L (ref 101–111)
Creatinine, Ser: 2.23 mg/dL — ABNORMAL HIGH (ref 0.61–1.24)
GFR calc Af Amer: 28 mL/min — ABNORMAL LOW (ref >60)
GFR, EST NON AFRICAN AMERICAN: 24 mL/min — AB (ref >60)
GLUCOSE: 120 mg/dL — AB (ref 65–99)
Potassium: 4.2 mmol/L (ref 3.5–5.1)
SODIUM: 139 mmol/L (ref 135–145)

## 2017-04-08 MED ORDER — NITROGLYCERIN 0.4 MG SL SUBL: 0.4000 mg | SUBLINGUAL_TABLET | SUBLINGUAL | 0 refills | Status: AC | PRN

## 2017-04-08 MED ORDER — METOPROLOL TARTRATE 50 MG PO TABS: 25.0000 mg | ORAL_TABLET | Freq: Two times a day (BID) | ORAL | 0 refills | Status: DC

## 2017-04-08 MED ORDER — PREDNISONE 10 MG PO TABS: ORAL_TABLET | ORAL | 0 refills | Status: DC

## 2017-04-08 MED ORDER — TORSEMIDE 20 MG PO TABS: 20.0000 mg | ORAL_TABLET | Freq: Every day | ORAL | 0 refills | Status: DC

## 2017-04-08 NOTE — Discharge Summary (Signed)
Physician Discharge Summary  [name] BTD:[id number] DOB: [birth year]  PCP: Kathyrn Drown, MD  Admit date: 04/06/2017 Discharge date: 04/08/2017  Recommendations for Outpatient Follow-up:  1. Dr. Sallee Lange, PCP in 4 days with repeat labs (CBC & BMP).  Home Health: Outpatient PT Equipment/Devices: None    Discharge Condition: Improved and stable  CODE STATUS: Full  Diet recommendation: Heart healthy diet.  Discharge Diagnoses:  Active Problems:   COPD exacerbation [facility])   Brief Summary: 84 year old married male, lives with his spouse and adult son, ambulates occasionally with help of a cane, PMH of lung cancer, COPD not on home oxygen, stage III chronic kidney disease, CAD status post CABG, former tobacco abuse, gout, HTN, HLD, normocytic anemia, Parkinson's disease, presented to the ED 04/06/17 with subacute onset of dyspnea, chronic right leg swelling (had surgery in that leg) and generalized weakness. EMS noted him to be hypertensive with moderate respiratory distress. Admitted for possible COPD versus CHF exacerbation.   Assessment & Plan:    1. Suspected acute COPD exacerbation versus acute on chronic diastolic CHF: Treated with IV Lasix 40 mg every 12 hours, IV Solu-Medrol 60 mg every 6 hours, bronchodilator nebulizations. Clinically improved. No hypoxia. Discontinued IV Lasix and changed to home dose of oral Demadex 40 MG daily. Not suspicious for infectious exacerbation of COPD. No antibiotics started. Prednisone taper at discharge. TTE results as below shows normal EF. 2. Minimal troponin elevation: Likely demand ischemia related to problem #1 & chronic kidney disease. No chest pain reported. TTE shows normal EF. Continue aspirin, statins and beta blockers. 3. CAD status post CABG: No chest pain. Low index of suspicion for ischemia. Continue aspirin, statins, beta blockers. 4. Stage III chronic kidney disease: Stable. Follow BMP next week as outpatient. 5. Normocytic  anemia: Likely chronic disease. Stable. Outpatient follow-up. 6. History of gout/hyperuricemia: Son requests repeat uric acid level which patient was supposed to have with his PCP >9.2. No acute gout flare. 7. Weight loss: Patient apparently lost 8 pounds over the last week or so. Suspected due to poor oral hydration. He apparently gained some weight. Outpatient follow-up. 8. Parkinson's disease: Follows with [facility]. As per son, supposed to start primidone soon. 9. Essential hypertension: Controlled. 10. History of lung cancer:   Consultants:  None   Procedures:  TTE 04/07/17: Study Conclusions  - Left ventricle: The cavity size was normal. Wall thickness was   increased in a pattern of mild LVH. Systolic function was normal.   The estimated ejection fraction was in the range of 55% to 60%.   Wall motion was normal; there were no regional wall motion   abnormalities. Doppler parameters are consistent with abnormal   left ventricular relaxation (grade 1 diastolic dysfunction).   Doppler parameters are consistent with high ventricular filling   pressure. - Aortic valve: Valve mobility was restricted. There was trivial   regurgitation. - Aortic root: The aortic root was mildly dilated. - Mitral valve: Severely calcified annulus. Mildly thickened   leaflets . There was mild regurgitation. - Left atrium: The atrium was mildly dilated. - Right ventricle: The cavity size was mildly dilated. - Right atrium: The atrium was mildly dilated. - Pulmonary arteries: Systolic pressure was mildly increased. PA   peak pressure: 43 mm Hg (S).  Impressions:  - Normal LV systolic function; mild LVH; mild diastolic   dysfunction; calcified aortic valve with trace AI and no AS;   mildly dilated aortic root; mild MR; mild LAE/RAE/RVE;  mild TR   with mildly elevated pulmonary pressure.    Discharge Instructions  Discharge Instructions    (HEART FAILURE PATIENTS)  Call MD:  Anytime you have any of the following symptoms: 1) 3 pound weight gain in 24 hours or 5 pounds in 1 week 2) shortness of breath, with or without a dry hacking cough 3) swelling in the hands, feet or stomach 4) if you have to sleep on extra pillows at night in order to breathe.   Complete by:  As directed    Call MD for:  difficulty breathing, headache or visual disturbances   Complete by:  As directed    Call MD for:  extreme fatigue   Complete by:  As directed    Call MD for:  persistant dizziness or light-headedness   Complete by:  As directed    Call MD for:  severe uncontrolled pain   Complete by:  As directed    Diet - low sodium heart healthy   Complete by:  As directed    Increase activity slowly   Complete by:  As directed        Medication List    STOP taking these medications   doxycycline 100 MG tablet Commonly known as:  VIBRA-TABS   KLOR-CON M10 10 MEQ tablet Generic drug:  potassium chloride     TAKE these medications   albuterol 108 (90 Base) MCG/ACT inhaler Commonly known as:  VENTOLIN HFA INHALE 2 PUFFS INTO THE LUNGS EVERY 6 HOURS AS NEEDED FOR SHORTNESS OF BREATH   aspirin EC 81 MG tablet Take 81 mg by mouth daily.   atorvastatin 40 MG tablet Commonly known as:  LIPITOR TAKE 1 TABLET (40 MG TOTAL) BY MOUTH DAILY. What changed:  See the new instructions.   budesonide-formoterol 160-4.5 MCG/ACT inhaler Commonly known as:  SYMBICORT INHALE 2 PUFF INTO THE LUNGS 2 (TWO) TIMES DAILY. What changed:    how much to take  how to take this  when to take this  additional instructions   carbidopa-levodopa 25-100 MG tablet Commonly known as:  SINEMET IR Take 1.5 tablets by mouth three times a day at 0600/1200/1730   CENTRUM SILVER 50+MEN Tabs Take 1 tablet by mouth daily.   docusate sodium 100 MG capsule Commonly known as:  COLACE Take 100 mg by mouth daily as needed for mild constipation.   fluticasone 50 MCG/ACT nasal spray Commonly  known as:  FLONASE Place 1-2 sprays into both nostrils daily as needed for allergies or rhinitis.   IMODIUM A-D PO Take 1 tablet by mouth every 4 (four) hours as needed (for).   metoprolol tartrate 50 MG tablet Commonly known as:  LOPRESSOR Take 0.5 tablets (25 mg total) by mouth 2 (two) times daily. What changed:    how much to take  when to take this   nitroGLYCERIN 0.4 MG SL tablet Commonly known as:  NITROSTAT Place 1 tablet (0.4 mg total) under the tongue every 5 (five) minutes as needed for chest pain. May take up to 3 doses per episode. What changed:  additional instructions   predniSONE 10 MG tablet Commonly known as:  DELTASONE Take 3 tabs daily for 3 days, then 2 tabs daily for 3 days, then 1 tab daily for 3 days, then stop. Start taking on:  04/09/2017 What changed:    medication strength  additional instructions   primidone 50 MG tablet Commonly known as:  MYSOLINE Take 200 mg by mouth at bedtime.   simethicone  80 MG chewable tablet Commonly known as:  MYLICON Chew 80 mg by mouth every 6 (six) hours as needed for flatulence.   SOOTHE XP Soln Place 1-2 drops into both eyes 2 (two) times daily as needed (for dry eyes).   tamsulosin 0.4 MG Caps capsule Commonly known as:  FLOMAX Take 1 capsule (0.4 mg total) by mouth at bedtime.   torsemide 20 MG tablet Commonly known as:  DEMADEX Take 1 tablet (20 mg total) by mouth daily.      [facility] Follow up.   Why:  Shower chair to be delivered to room prior to discharge. Contact information: [place] [address] [phone]        Kathyrn Drown, MD. Schedule an appointment as soon as possible for a visit in 4 day(s).   Specialty:  Family Medicine Why:  To be seen with repeat labs (CBC & BMP). Contact information: [place] [address] [phone]          Allergies  Allergen Reactions  .  Propranolol Nausea Only  . Levaquin [Levofloxacin In D5w] Hives and Nausea And Vomiting  . Penicillins Hives and Swelling    Has patient had a PCN reaction causing immediate rash, facial/tongue/throat swelling, SOB or lightheadedness with hypotension: Yes Has patient had a PCN reaction causing severe rash involving mucus membranes or skin necrosis: No Has patient had a PCN reaction that required hospitalization No Has patient had a PCN reaction occurring within the last 10 years: No If all of the above answers are "NO", then may proceed with Cephalosporin use.   . Sulfonamide Derivatives Hives and Swelling  . Sympathomimetics Other (See Comments)  . Tape Other (See Comments)    SKIN IS VERY THIN AND TEARS EASILY; PLEASE USE AN ALTERNATIVE TO TAPE!!      Procedures/Studies:  Dg Chest Port 1 View  Result Date: 04/06/2017 CLINICAL DATA:  Shortness of breath and started last night EXAM: PORTABLE CHEST 1 VIEW COMPARISON:  04/10/2016 FINDINGS: COPD with hyperinflation. History of lung cancer with chronic post treatment appearance of the left apex. Normal heart size. Status post CABG. Stable mediastinal contours. No acute osseous finding. IMPRESSION: COPD and postoperative left apex.  No acute finding. Electronically Signed   By: Monte Fantasia M.D.   On: 04/06/2017 21:51     Subjective: Denies complaints. States that he feels much better. Dyspnea resolved. No cough, chest pain, dizziness or lightheadedness. He reports that he has had chronic right leg swelling since bypass surgery but that too has improved since admission.  Discharge Exam:  Vitals:   04/08/17 0506 04/08/17 0736 04/08/17 0826 04/08/17 1219  BP: (!) 130/47 (!) 137/57  (!) 116/51  Pulse: 74 70  80  Resp: 18 18  18   Temp: 98.1 F (36.7 C) 97.7 F (36.5 C)  98.7 F (37.1 C)  TempSrc: Oral Oral  Oral  SpO2: 97% 98% 98% 98%  Weight: 58.2 kg (128 lb 4.8 oz)     Height:        General exam: Pleasant elderly male lying  comfortably supine in bed. Respiratory system: Clear to auscultation. Respiratory effort normal. Cardiovascular system: S1 & S2 heard, RRR. No JVD, murmurs, rubs, gallops or clicks. Chronic right leg trace edema-almost resolved. Has long surgical scar from bypass harvesting. Telemetry personally reviewed: SR with BBB morphology Gastrointestinal system: Abdomen is nondistended, soft and nontender. No organomegaly or  masses felt. Normal bowel sounds heard. Central nervous system: Alert and oriented. No focal neurological deficits. Extremities: Symmetric 5 x 5 power. Resting tremors of right hand noted. Skin: No rashes, lesions or ulcers Psychiatry: Judgement and insight appear normal. Mood & affect appropriate.       The results of significant diagnostics from this hospitalization (including imaging, microbiology, ancillary and laboratory) are listed below for reference.     Labs: CBC: Recent Labs  Lab 04/06/17 2130  WBC 8.1  NEUTROABS 4.5  HGB 10.1*  HCT 31.6*  MCV 95.8  PLT 277   Basic Metabolic Panel: Recent Labs  Lab 04/06/17 2130 04/07/17 0159 04/08/17 0707  NA 137 137 139  K 3.9 3.6 4.2  CL 105 104 103  CO2 24 23 27   GLUCOSE 116* 178* 120*  BUN 47* 46* 51*  CREATININE 2.14* 2.17* 2.23*  CALCIUM 8.5* 8.7* 8.7*   Liver Function Tests: Recent Labs  Lab 04/06/17 2130  AST 22  ALT <5*  ALKPHOS 69  BILITOT 0.9  PROT 5.9*  ALBUMIN 3.1*   BNP (last 3 results) Recent Labs    04/06/17 2131  BNP 383.9*   Cardiac Enzymes: Recent Labs  Lab 04/07/17 0159 04/07/17 0611 04/07/17 1406  TROPONINI <0.03 0.04* 0.05*   Discussed in detail with patient's son. Updated care and answered questions.    Time coordinating discharge: Over 30 minutes  SIGNED:  Vernell Leep, MD, FACP, [facility]. [facility] Pager [phone]  If 7PM-7AM, please contact night-coverage [email or web address] Password [email or web address] 04/08/2017, 3:21 PM

## 2017-04-08 NOTE — Discharge Instructions (Signed)

## 2017-04-08 NOTE — Telephone Encounter (Signed)
Nurse's-patient recently discharged from the hospital. Please call patient, let them know that we are aware that they were discharged from the hospital. Please schedule them to follow-up with [place] within the next 7 days. Advised the patient to bring all of their medications with him to the visit. Please inquire if they are having any acute issues currently and documented accordingly.

## 2017-04-08 NOTE — Progress Notes (Addendum)
Patient discharged home with family, IV removed and paperwork/belongings packed. Wheeled to vehicle.  Patient's son refused shower chair for patient, saying he won't use it. Case manager notified.

## 2017-04-08 NOTE — Care Management Note (Addendum)
Case Management Note  Patient Details  Name: SEITH AIKEY MRN: [id number] Date of Birth: [birth year]  Subjective/Objective:    Admitted for COPD exacerbation               Action/Plan: In to speak with Pt wife and son at bedside.Prior to admission Pt lived at home with wife and son.  Pt independent of all ADL's; requires minimal assist related to Parkinson's.  Son take PT to appointments and does the shopping, most of cooking.  PT denies inability to afford medications and food. PCP is Dr. Sallee Lange in [place]. Home DME: cane. Discussed order for DME: shower chair. Referral calledl to [facility] with advance, to be delivered prior to discharge . Denies past [place] agency use.  In to speak with PT and discussed recommendations made for PT; offered choice for [place], Pt declined [facility] agency; requested [facility].  NCM will continue to follow for discharge needs.    Expected Discharge Date:  04/09/17               Expected Discharge Plan:  Home/Self Care  Discharge planning Services  CM Consult  Status of Service:  In process, will continue to follow  Montel Culver, BSN, RN  Case Manager- Orientation [phone] 04/08/2017, 10:33 AM

## 2017-04-08 NOTE — Evaluation (Signed)
Physical Therapy Evaluation Patient Details Name: [name] MRN: [id number] DOB: 10/03/1921 Today's Date: 04/08/2017   History of Present Illness  83 yo male with onset of COPD exacerbation was admitted OBS for elevated troponin.  PMHx:  Parkinson's, lung CA, CKD, CAD, CABG, gout, HTN,   Clinical Impression  Pt is up to walk with PT and noted his functional level for son and wife to discuss plan for follow up.  He is progressing well with mobility but requires assistance for all steps.  Talked with son and pt about outpatient therapy and a RW but he declines RW.  Agreement was to let outpatient therapy help decide and son will give hands on assistance at home.  Pt will remain in PT acutely as needed to strengthen and progress endurance and balance with gait.    Follow Up Recommendations Outpatient PT    Equipment Recommendations  Other (comment)(pt looks steadier on RW but declines )    Recommendations for Other Services       Precautions / Restrictions Precautions Precautions: Fall(telemetry) Restrictions Weight Bearing Restrictions: No      Mobility  Bed Mobility Overal bed mobility: Needs Assistance Bed Mobility: Supine to Sit;Sit to Supine     Supine to sit: Min assist Sit to supine: Min guard;Min assist   General bed mobility comments: minor help with trunk and pt can manage legs to get OOB, assisted legs upon return  Transfers Overall transfer level: Needs assistance Equipment used: Rolling walker (2 wheeled);1 person hand held assist Transfers: Sit to/from Stand Sit to Stand: Min guard;Min assist         General transfer comment: pt used walker to steady initially  Ambulation/Gait Ambulation/Gait assistance: Min assist;Min guard Ambulation Distance (Feet): 100 Feet Assistive device: Rolling walker (2 wheeled);1 person hand held assist Gait Pattern/deviations: Step-through pattern;Decreased stride length;Wide base of support;Trunk flexed Gait velocity:  fast Gait velocity interpretation: at or above normal speed for age/gender General Gait Details: pt walks with fast pace and requires cues for speed management and safety with turns  Stairs            Wheelchair Mobility    Modified Rankin (Stroke Patients Only)       Balance Overall balance assessment: Needs assistance Sitting-balance support: Feet supported Sitting balance-Leahy Scale: Good     Standing balance support: Bilateral upper extremity supported Standing balance-Leahy Scale: Poor                               Pertinent Vitals/Pain Pain Assessment: No/denies pain    Home Living Family/patient expects to be discharged to:: Private residence Living Arrangements: Spouse/significant other;Children Available Help at Discharge: Family;Available 24 hours/day Type of Home: House Home Access: Stairs to enter   [employer] of Steps: 1 Home Layout: One level Home Equipment: Cane - single point Additional Comments: asking for [facility] and does not have walker    Prior Function Level of Independence: Needs assistance   Gait / Transfers Assistance Needed: SPC with son assisting out of the house  ADL's / Homemaking Assistance Needed: wife and son do shopping and cooking, pt gets help for dressing and bathing        Hand Dominance   Dominant Hand: Right    Extremity/Trunk Assessment   Upper Extremity Assessment Upper Extremity Assessment: Generalized weakness    Lower Extremity Assessment Lower Extremity Assessment: Generalized weakness    Cervical / Trunk Assessment Cervical /  Trunk Assessment: Kyphotic  Communication   Communication: No difficulties  Cognition Arousal/Alertness: Awake/alert Behavior During Therapy: WFL for tasks assessed/performed Overall Cognitive Status: Within Functional Limits for tasks assessed                                        General Comments General comments (skin integrity,  edema, etc.): pt requires cues and assistance to navigate safely but is not weak in LE's    Exercises     Assessment/Plan    PT Assessment Patient needs continued PT services  PT Problem List Decreased range of motion;Decreased activity tolerance;Decreased balance;Decreased mobility;Decreased coordination;Decreased cognition;Decreased safety awareness;Decreased knowledge of use of DME;Cardiopulmonary status limiting activity       PT Treatment Interventions DME instruction;Gait training;Stair training;Functional mobility training;Therapeutic activities;Therapeutic exercise;Balance training;Neuromuscular re-education;Patient/family education    PT Goals (Current goals can be found in the Care Plan section)  Acute Rehab PT Goals Patient Stated Goal: to get up and walk alone on cane PT Goal Formulation: With patient/family Time For Goal Achievement: 04/22/17 Potential to Achieve Goals: Good    Frequency Min 3X/week   Barriers to discharge Inaccessible home environment has one step    Co-evaluation               AM-PAC PT "6 Clicks" Daily Activity  Outcome Measure Difficulty turning over in bed (including adjusting bedclothes, sheets and blankets)?: A Little Difficulty moving from lying on back to sitting on the side of the bed? : Unable Difficulty sitting down on and standing up from a chair with arms (e.g., wheelchair, bedside commode, etc,.)?: Unable Help needed moving to and from a bed to chair (including a wheelchair)?: A Little Help needed walking in hospital room?: A Little Help needed climbing 3-5 steps with a railing? : A Lot 6 Click Score: 13    End of Session Equipment Utilized During Treatment: Gait belt Activity Tolerance: Patient tolerated treatment well;Other (comment)(O2 sats were WFL with gait on RA) Patient left: in bed;with call bell/phone within reach;with bed alarm set;with family/visitor present Nurse Communication: Mobility status;Other  (comment)(notified case manager to get outpt PT order) PT Visit Diagnosis: Unsteadiness on feet (R26.81);Difficulty in walking, not elsewhere classified (R26.2)    Time: 6546-5035 PT Time Calculation (min) (ACUTE ONLY): 30 min   Charges:   PT Evaluation $PT Eval Moderate Complexity: 1 Mod PT Treatments $Gait Training: 8-22 mins   PT G Codes:   PT G-Codes **NOT FOR INPATIENT CLASS** Functional Assessment Tool Used: AM-PAC 6 Clicks Basic Mobility;Clinical judgement Functional Limitation: Mobility: Walking and moving around Mobility: Walking and Moving Around Current Status (W6568): At least 40 percent but less than 60 percent impaired, limited or restricted Mobility: Walking and Moving Around Goal Status [phone]): At least 1 percent but less than 20 percent impaired, limited or restricted    Ramond Dial 04/08/2017, 4:50 PM   Mee Hives, PT MS Acute Rehab Dept. Number: [place] [facility] [place]

## 2017-04-08 NOTE — Progress Notes (Signed)
Patient refused [place] services and request Outpatient Physical Therapy at [facility] in [place]. Orders/ clinical faxed as requested; they will call the patient at home for a start up date and time for his therapy; Aneta Mins [phone]

## 2017-04-09 NOTE — Telephone Encounter (Signed)
I spoke with the pt son Legrand Como and he is aware to have pt her on 04/12/2017 for hospital follow up visit.

## 2017-04-12 ENCOUNTER — Encounter: Payer: Self-pay | Admitting: Family Medicine

## 2017-04-12 ENCOUNTER — Ambulatory Visit: Payer: Medicare Other | Admitting: Family Medicine

## 2017-04-12 VITALS — BP 112/62 | Ht 67.0 in | Wt 133.4 lb

## 2017-04-12 DIAGNOSIS — N183 Chronic kidney disease, stage 3 unspecified: Secondary | ICD-10-CM

## 2017-04-12 DIAGNOSIS — Z79899 Other long term (current) drug therapy: Secondary | ICD-10-CM

## 2017-04-12 DIAGNOSIS — E213 Hyperparathyroidism, unspecified: Secondary | ICD-10-CM

## 2017-04-12 DIAGNOSIS — I10 Essential (primary) hypertension: Secondary | ICD-10-CM | POA: Diagnosis not present

## 2017-04-12 DIAGNOSIS — R7989 Other specified abnormal findings of blood chemistry: Secondary | ICD-10-CM

## 2017-04-12 NOTE — Progress Notes (Signed)
   Subjective:    Patient ID: [name], male    DOB: 1921-11-09, 83 y.o.   MRN: [id number]  HPI  Patient arrives for a follow up after a recent hospitalization for COPD exacerbation.  25 minutes spent with patient reviewing over hospitalization the lab results and x-ray results echo results answering questions of the family greater than half in consultation lab work did show stable chronic kidney disease and stable anemia Echo showed ejection fraction 55-60% patient's weight has come down some but he is now currently taking a diuretic his appetite overall seems to be doing good and his breathing is doing okay his oxygen level is at 97% today Review of Systems Relates some coughing some wheezing no vomiting or diarrhea no sweats or chills    Objective:   Physical Exam Neck no masses heart regular lung no wheezes detected moves air fairly well extremities trace edema skin warm dry  Lab work reviewed and x-ray reviewed from hospitalization as well as echo I think more likely he had mild COPD issues flareup that caused his problems rather than CHF     Assessment & Plan:  Transitional care-he would benefit from having a pulse oximeter at home prescription was written for this this can sometimes help to decide whether or not need to go to ER versus come here  COPD stable continue current measures  Weight loss-we need to monitor this the son will check weight on a regular basis send [place] some readings otherwise follow-up in 1 month  I doubt that his weight loss is due to cancer he sees a specialist in June at [facility] the patient is has a good appetite overall I believe his new normal when it comes to his weight is between 130 and 026 pounds  Certainly we are open to doing more lab work and testing if his weight continues to lose  Chronic kidney disease do a follow-up metabolic 7

## 2017-04-20 ENCOUNTER — Telehealth: Payer: Self-pay | Admitting: Podiatry

## 2017-04-20 NOTE — Telephone Encounter (Signed)
Attempted to call Dr. Arvil Persons office and they are closed for lunch from 12:00 pm - 1:30 pm. Left message letting them know I was returning their call and attempted to return it earlier today at 9:56 am but their phone rang busy. I told them I would get the requested records on Mr. Mck[place]ie to them either by the end of the day today or tomorrow as I am the only person that does our Medical Records for two of our locations. I also explained that I am a little behind as I as filling in for one of my co-workers the past 2 weeks. Told them if they needed to speak to me, they could call me back directly at [phone] and leave a message if I do not answer.

## 2017-04-20 NOTE — Telephone Encounter (Signed)
Hello, calling back from Dr. Arvil Persons office. Trying to uh get medical records on a mutual patient. If someone would please call me back at [phone]. Thank you.

## 2017-04-20 NOTE — Telephone Encounter (Signed)
Tried calling Dr. Arvil Persons office with [place] back in regards to medical records request from appointment in December 2018 but the phone line was busy. I will fax those requested notes to them by the end of the day today.

## 2017-04-20 NOTE — Telephone Encounter (Signed)
Hello, I'm calling from Dr. Arvil Persons office. We referred [name] to your office and we are needing his office visit notes from December 2018. If you would please give [place] a call back at [phone] and our fax number is [phone]. Thank you.

## 2017-04-21 ENCOUNTER — Encounter (HOSPITAL_COMMUNITY): Payer: Self-pay

## 2017-04-21 ENCOUNTER — Ambulatory Visit (HOSPITAL_COMMUNITY): Payer: Medicare Other | Attending: Internal Medicine

## 2017-04-21 DIAGNOSIS — R269 Unspecified abnormalities of gait and mobility: Secondary | ICD-10-CM | POA: Diagnosis present

## 2017-04-21 DIAGNOSIS — M6281 Muscle weakness (generalized): Secondary | ICD-10-CM | POA: Insufficient documentation

## 2017-04-21 DIAGNOSIS — R296 Repeated falls: Secondary | ICD-10-CM

## 2017-04-21 DIAGNOSIS — R29898 Other symptoms and signs involving the musculoskeletal system: Secondary | ICD-10-CM | POA: Insufficient documentation

## 2017-04-21 DIAGNOSIS — R2681 Unsteadiness on feet: Secondary | ICD-10-CM

## 2017-04-21 DIAGNOSIS — R2689 Other abnormalities of gait and mobility: Secondary | ICD-10-CM | POA: Diagnosis present

## 2017-04-21 NOTE — Therapy (Signed)
[place], [place], [address] Phone: [phone]   Fax:  [phone]  Physical Therapy Evaluation  Patient Details  Name: [name] MRN: [id number] Date of Birth: 1922/03/25 Referring Provider: Modena Jansky, MD   Encounter Date: 04/21/2017  PT End of Session - 04/21/17 1238    Visit Number  1    Number of Visits  13    Date for PT Re-Evaluation  05/12/17    Authorization Type  [employer] Medicare    Authorization Time Period  04/21/17 to 06/02/17    PT Start Time  0900    PT Stop Time  0942    PT Time Calculation (min)  42 min    Equipment Utilized During Treatment  Gait belt    Activity Tolerance  Patient tolerated treatment well    Behavior During Therapy  [facility] for tasks assessed/performed       Past Medical History:  Diagnosis Date  . ASCVD (arteriosclerotic cardiovascular disease)     CABG in 04/1993; and negative stress nuclear study in 08/2001  . Benign essential tremor   . CAD (coronary artery disease)   . Cancer ([place])    skin  . Cerebrovascular disease    Right carotid bruit-40-69% left internal carotid artery stenosis in 4/06; followed [facility]  . Chronic kidney disease, stage II (mild)    Creatinine-1.6 in 09/2008  . COPD (chronic obstructive pulmonary disease) ([place])   . Degenerative joint disease of knee, left   . Gout   . Horseshoe kidney   . Hyperlipidemia   . Hypertension   . Lung cancer ([place]) 2013  . Normocytic anemia   . Prediabetes   . Pulmonary disease   . Renal insufficiency   . Syncope   . Tobacco abuse, in remission    40 pack years; discontinued in 1980    Past Surgical History:  Procedure Laterality Date  . CARDIAC SURGERY    . COLONOSCOPY W/ POLYPECTOMY  1985  . CORONARY ARTERY BYPASS GRAFT  1995  . LESION EXCISION    . PILONIDAL CYST EXCISION  1948    There were no vitals filed for this visit.   Subjective Assessment - 04/21/17 0903    Subjective  Pt states that he was  recently d/c'd from the hospital due to COPD and he is referred to OPPT for strength, endurance, and gait training. He has h/o Parkinson's Disease. His son reports that he was a moderate fall risk when at the hospital and when he has his attackes, he gets real weak. He will use a cane PRN. His son reports that his balance is an issue for him. His son reports he has had about 3 falls in the last 6 months. He states that his legs gave out on him which caused the falls. He lives with his son and ambulates 15 stairs but does pretty well with it.     Patient is accompained by:  Family member son    How long can you stand comfortably?  no real issues    How long can you walk comfortably?  5 mins before getting SOB    Patient Stated Goals  get better    Currently in Pain?  No/denies         [facility] PT Assessment - 04/21/17 0001      Assessment   Medical Diagnosis  COPD    Referring Provider  Modena Jansky, MD    Next  MD Visit  F/u with Dr. Wolfgang Phoenix today or tomorrow for blood work and then PRN    Prior Therapy  yes for balance about 2 years ago      Balance Screen   Has the patient fallen in the past 6 months  Yes    How many times?  3    Has the patient had a decrease in activity level because of a fear of falling?   No    Is the patient reluctant to leave their home because of a fear of falling?   No      Prior Function   Level of Independence  Independent;Independent with basic ADLs    Leisure  watch football, goes to the [place] / Strength   AROM / PROM / Strength  Strength      Strength   Right Hip Flexion  5/5    Right Hip Extension  2+/5    Right Hip ABduction  4/5    Left Hip Flexion  5/5    Left Hip Extension  2+/5    Left Hip ABduction  4/5    Right Knee Flexion  4+/5    Right Knee Extension  5/5    Left Knee Flexion  4+/5    Left Knee Extension  5/5    Right Ankle Dorsiflexion  5/5    Left Ankle Dorsiflexion  4/5      Ambulation/Gait   Ambulation  Distance (Feet)  372 Feet 3MWT    Assistive device  None    Gait Pattern  Step-through pattern;Trendelenburg;Decreased arm swing - right;Decreased arm swing - left    Gait velocity  0.68m/s    Gait Comments  c/o L anterior tib fatigue requiring rest break      Balance   Balance Assessed  Yes      Static Standing Balance   Static Standing - Balance Support  No upper extremity supported    Static Standing Balance -  Activities   Single Leg Stance - Right Leg;Single Leg Stance - Left Leg    Static Standing - Comment/# of Minutes  R: 3 sec or <; L: 5 sec or <      Standardized Balance Assessment   Standardized Balance Assessment  Five Times Sit to Stand;Timed Up and Go Test;Dynamic Gait Index    Five times sit to stand comments   10.5 sec with no UE      Dynamic Gait Index   Level Surface  Normal    Change in Gait Speed  Mild Impairment    Gait with Horizontal Head Turns  Mild Impairment    Gait with Vertical Head Turns  Mild Impairment    Gait and Pivot Turn  Normal    Step Over Obstacle  Mild Impairment    Step Around Obstacles  Mild Impairment    Steps  Moderate Impairment    Total Score  17          Objective measurements completed on examination: See above findings.      PT Education - 04/21/17 1237    Education provided  Yes    Education Details  exam findings, POC, HEP    Person(s) Educated  Patient;Child(ren)    Methods  Explanation;Demonstration;Handout    Comprehension  Verbalized understanding;Returned demonstration       PT Short Term Goals - 04/21/17 1246      PT SHORT TERM GOAL #1   Title  Pt  will be independent with HEP and perform consistently in order to maximize overall strength and decrease risk for falls.     Time  3    Period  Weeks    Status  New    Target Date  05/12/17      PT SHORT TERM GOAL #2   Title  Pt will have 1/2 grade improvement in MMT of all muscle groups tested in order to maximize gait, balance, and decrease risk for falls.     Time  3    Period  Weeks    Status  New      PT SHORT TERM GOAL #3   Title  Pt will be able to perform bil SLS for 10 sec or > with no UE support in order to maximize gait over uneven ground.    Time  3    Period  Weeks    Status  New        PT Long Term Goals - 04/21/17 1247      PT LONG TERM GOAL #1   Title  Pt will have at least 1 grade improvement in MMT of all muscle groups tested to maximzie transfers, gait, and allow him to complete ADLs with greater ease.     Time  6    Period  Weeks    Status  New    Target Date  06/02/17      PT LONG TERM GOAL #2   Title  Pt will be able to ambulate at least 664ft during the 3MWT with LRAD and without requiring any rest breaks to demo improved strength and endurance to improve his community access.     Time  6    Period  Weeks    Status  New      PT LONG TERM GOAL #3   Title  Pt will score 20/24 or > on the DGI to demo improved dynamic balance and decrease his risk for falls.     Time  6    Period  Weeks    Status  New      PT LONG TERM GOAL #4   Title  Pt report going to the local fitness center at least 3 days/week and performing his regular exercise routine to demo improved overall function.    Time  6    Period  Weeks    Status  New             Plan - 04/21/17 1240    Clinical Impression Statement  Pt is pleasant 84 YO M who presents to OPPT with c/o generalized weakness s/p recent hospitalization and deficits in balance. Pt has h/o Parkinson's Disease. He currently presents with deficits in MMT, SLS, gait, functional strength, and dynamic balance. He scored 17/24 on the DGI putting him at risk for increased falls. He was only able to ambulate 335ft during the 3MWT before needing to use the remainder of time for rest, reporting increased LLE fatigue, pinpointing his anterior tibialis. Pt needs skilled PT intervention to address these deficits in order maximize his overall strength, balance, and function to decrease risk  for falls.     History and Personal Factors relevant to plan of care:  Parkinson's Disease; COPD, CAD, ASCVD, HTN, Cerebrovascular disease, gout, hyperlipidemia,     Clinical Presentation  Stable    Clinical Presentation due to:  MMT, SLS, gait, functional strength, and dynamic balance    Clinical Decision Making  Low  Rehab Potential  Fair    PT Frequency  2x / week    PT Duration  6 weeks    PT Treatment/Interventions  ADLs/Self Care Home Management;Cryotherapy;Electrical Stimulation;Moist Heat;DME Instruction;Gait training;Stair training;Functional mobility training;Therapeutic activities;Therapeutic exercise;Balance training;Neuromuscular re-education;Patient/family education;Manual techniques;Passive range of motion;Dry needling;Taping;Energy conservation    PT Next Visit Plan  review goals and HEP; initiate BLE and functional strengthening (especially of hip extensors), balance and gait training, progressing as tolerated    PT Home Exercise Plan  eval: seated heel and toe raises, sit to stands    Consulted and Agree with Plan of Care  Patient;Family member/caregiver    Family Member Consulted  son       Patient will benefit from skilled therapeutic intervention in order to improve the following deficits and impairments:  Abnormal gait, Cardiopulmonary status limiting activity, Decreased activity tolerance, Decreased balance, Decreased coordination, Decreased endurance, Decreased knowledge of use of DME, Decreased strength, Difficulty walking, Hypomobility, Increased muscle spasms, Impaired perceived functional ability, Impaired flexibility, Improper body mechanics, Postural dysfunction  Visit Diagnosis: Unsteadiness on feet - Plan: PT plan of care cert/re-cert  Muscle weakness (generalized) - Plan: PT plan of care cert/re-cert  Repeated falls - Plan: PT plan of care cert/re-cert     Problem List Patient Active Problem List   Diagnosis Date Noted  . Parkinson's disease ([place])  10/05/2016  . Diastolic CHF, chronic ([place]) 05/10/2015  . Dizziness   . Coronary artery disease involving coronary bypass graft of native heart without angina pectoris   . Near syncope 04/02/2015  . Diarrhea 04/02/2015  . COPD exacerbation ([place]) 06/04/2013  . SOB (shortness of breath) 06/04/2013  . Acute respiratory failure with hypoxia ([place]) 06/04/2013  . Hemoptysis 08/10/2012  . Prediabetes 08/28/2011  . Lung cancer ([place]) 08/25/2011  . COPD (chronic obstructive pulmonary disease) ([place]) 08/25/2011  . CKD (chronic kidney disease), stage III ([place]) 12/07/2010  . Chest pain 08/18/2010  . Syncope   . Tobacco abuse, in remission   . Degenerative joint disease of knee, left   . COLONIC POLYPS 05/20/2009  . Hyperlipidemia 05/20/2009  . ANEMIA 05/20/2009  . Essential tremor 05/20/2009  . ATHEROSCLEROTIC CARDIOVASCULAR DISEASE 05/20/2009  . CEREBROVASCULAR DISEASE 05/20/2009  . Essential hypertension 01/14/2009       Geraldine Solar PT, DPT  [facility] [address] [place], [place], [address] Phone: [phone]   Fax:  [phone]  Name: [name] MRN: [id number] Date of Birth: Aug 15, 1921

## 2017-04-21 NOTE — Patient Instructions (Signed)
  TOES RAISES - DORSIFLEXION - BOTH  Start with your feet on the ground.  Next, raise up both forefeet and toes as shown as you bend at your ankle.  Keep your heels on the ground the entire time.  Perform 1x/day, 2-3 sets of 10-15 reps (whatever you can tolerate)    HEEL RAISES - PLANTARFLEXION - BILATERAL  Start with your entire foot on the ground.  Next, raise up your heels as you press your toes down.  Keep your toes on the ground the entire time.  Perform 1x/day, 2-3 sets of 10-15 reps (whatever you can tolerate)   SIT TO STAND - NO SUPPORT  Start by scooting close to the front of the chair.  Next, lean forward at your trunk and reach forward with your arms and rise to standing without using your hands to push off from the chair or other object.   Use your arms as a counter-balance by reaching forward when in sitting and lower them as you approach standing.   Perform 1x/day, 2-3 sets of 10-15 reps (whatever you can tolerate)

## 2017-04-22 LAB — BASIC METABOLIC PANEL
BUN/Creatinine Ratio: 22 (ref 10–24)
BUN: 51 mg/dL — ABNORMAL HIGH (ref 10–36)
CALCIUM: 8.2 mg/dL — AB (ref 8.6–10.2)
CO2: 24 mmol/L (ref 20–29)
Chloride: 101 mmol/L (ref 96–106)
Creatinine, Ser: 2.27 mg/dL — ABNORMAL HIGH (ref 0.76–1.27)
GFR, EST AFRICAN AMERICAN: 28 mL/min/{1.73_m2} — AB (ref >59)
GFR, EST NON AFRICAN AMERICAN: 24 mL/min/{1.73_m2} — AB (ref >59)
Glucose: 131 mg/dL — ABNORMAL HIGH (ref 65–99)
Potassium: 4.6 mmol/L (ref 3.5–5.2)
Sodium: 140 mmol/L (ref 134–144)

## 2017-04-23 ENCOUNTER — Ambulatory Visit (HOSPITAL_COMMUNITY): Payer: Medicare Other

## 2017-04-23 DIAGNOSIS — R269 Unspecified abnormalities of gait and mobility: Secondary | ICD-10-CM

## 2017-04-23 DIAGNOSIS — M6281 Muscle weakness (generalized): Secondary | ICD-10-CM

## 2017-04-23 DIAGNOSIS — R2681 Unsteadiness on feet: Secondary | ICD-10-CM | POA: Diagnosis not present

## 2017-04-23 DIAGNOSIS — R29898 Other symptoms and signs involving the musculoskeletal system: Secondary | ICD-10-CM

## 2017-04-23 DIAGNOSIS — R296 Repeated falls: Secondary | ICD-10-CM

## 2017-04-23 DIAGNOSIS — R2689 Other abnormalities of gait and mobility: Secondary | ICD-10-CM

## 2017-04-23 NOTE — Therapy (Signed)
[place], [place], [address] Phone: [phone]   Fax:  [phone]  Physical Therapy Treatment  Patient Details  Name: [name] MRN: [id number] Date of Birth: 02-17-1922 Referring Provider: Modena Jansky, MD   Encounter Date: 04/23/2017  PT End of Session - 04/23/17 0938    Visit Number  2    Number of Visits  13    Date for PT Re-Evaluation  05/12/17    Authorization Type  [employer] Medicare    Authorization Time Period  04/21/17 to 06/02/17    PT Start Time  0903    PT Stop Time  0943    PT Time Calculation (min)  40 min    Activity Tolerance  Patient tolerated treatment well    Behavior During Therapy  [facility] for tasks assessed/performed       Past Medical History:  Diagnosis Date  . ASCVD (arteriosclerotic cardiovascular disease)     CABG in 04/1993; and negative stress nuclear study in 08/2001  . Benign essential tremor   . CAD (coronary artery disease)   . Cancer ([place])    skin  . Cerebrovascular disease    Right carotid bruit-40-69% left internal carotid artery stenosis in 4/06; followed [facility]  . Chronic kidney disease, stage II (mild)    Creatinine-1.6 in 09/2008  . COPD (chronic obstructive pulmonary disease) ([place])   . Degenerative joint disease of knee, left   . Gout   . Horseshoe kidney   . Hyperlipidemia   . Hypertension   . Lung cancer ([place]) 2013  . Normocytic anemia   . Prediabetes   . Pulmonary disease   . Renal insufficiency   . Syncope   . Tobacco abuse, in remission    40 pack years; discontinued in 1980    Past Surgical History:  Procedure Laterality Date  . CARDIAC SURGERY    . COLONOSCOPY W/ POLYPECTOMY  1985  . CORONARY ARTERY BYPASS GRAFT  1995  . LESION EXCISION    . PILONIDAL CYST EXCISION  1948    There were no vitals filed for this visit.  Subjective Assessment - 04/23/17 0906    Subjective  Pt reports he is doing ok today. He was at the dentist yesterday to have   atool pulled, which really limited him for the day. He di dnot have a chance to do hi HEP yet.     Currently in Pain?  No/denies                      [facility] Adult PT Treatment/Exercise - 04/23/17 0001      Exercises   Exercises  Knee/Hip      Knee/Hip Exercises: Standing   Hip Extension  2 sets;10 reps;Both;Knee straight standing with cahir, then leaning elbow son counter    SLS  c high knee marching: 2x8x3sec H     Other Standing Knee Exercises  Wall leaning dorsiflexion: 1x10 (trial for HEP) 1x10 @ counter  too difficult for LLE (little AROM) will need modification      Knee/Hip Exercises: Seated   Other Seated Knee/Hip Exercises  Seated heel Raises 1x15 (HEP Review)  Seated dorsiflexion: 1x15 (HEP Review)     Sit to Sand  2 sets;10 reps;without UE support arms in flexion (HEP review)       Knee/Hip Exercises: Supine   Bridges  Both;10 reps;2 sets          Balance Exercises -  04/23/17 0945      Balance Exercises: Standing   Standing Eyes Opened  Narrow base of support (BOS);Foam/compliant surface;Cognitive challenge basketball toss/catch 15x fwd, 45* Rt, 45* Lt.           PT Short Term Goals - 04/21/17 1246      PT SHORT TERM GOAL #1   Title  Pt will be independent with HEP and perform consistently in order to maximize overall strength and decrease risk for falls.     Time  3    Period  Weeks    Status  New    Target Date  05/12/17      PT SHORT TERM GOAL #2   Title  Pt will have 1/2 grade improvement in MMT of all muscle groups tested in order to maximize gait, balance, and decrease risk for falls.    Time  3    Period  Weeks    Status  New      PT SHORT TERM GOAL #3   Title  Pt will be able to perform bil SLS for 10 sec or > with no UE support in order to maximize gait over uneven ground.    Time  3    Period  Weeks    Status  New        PT Long Term Goals - 04/21/17 1247      PT LONG TERM GOAL #1   Title  Pt will have at least 1 grade  improvement in MMT of all muscle groups tested to maximzie transfers, gait, and allow him to complete ADLs with greater ease.     Time  6    Period  Weeks    Status  New    Target Date  06/02/17      PT LONG TERM GOAL #2   Title  Pt will be able to ambulate at least 65ft during the 3MWT with LRAD and without requiring any rest breaks to demo improved strength and endurance to improve his community access.     Time  6    Period  Weeks    Status  New      PT LONG TERM GOAL #3   Title  Pt will score 20/24 or > on the DGI to demo improved dynamic balance and decrease his risk for falls.     Time  6    Period  Weeks    Status  New      PT LONG TERM GOAL #4   Title  Pt report going to the local fitness center at least 3 days/week and performing his regular exercise routine to demo improved overall function.    Time  6    Period  Weeks    Status  New            Plan - 04/23/17 5277    Clinical Impression Statement  Reviewed HEP, examinaiton, adn treatment goals. Added to HEP. dorsiflexion remains too weak to move to standing yet, will keep in sitting. Balance activity requires heavy cues for sequencing, which son helps with. Pt making good progress towward goals overall. all activity performed today without pain or SOB. Only 1 LOB with ball toss in narrow on foam, which can be progressed next visit.     Rehab Potential  Fair    PT Frequency  2x / week    PT Duration  6 weeks    PT Treatment/Interventions  ADLs/Self Care Home Management;Cryotherapy;Electrical Stimulation;Moist Heat;DME Instruction;Gait  training;Stair training;Functional mobility training;Therapeutic activities;Therapeutic exercise;Balance training;Neuromuscular re-education;Patient/family education;Manual techniques;Passive range of motion;Dry needling;Taping;Energy conservation    PT Next Visit Plan  continue BLE and functional strengthening (especially of hip extensors), balance and gait training, progressing as  tolerated    PT Home Exercise Plan  eval: seated heel and toe raises, sit to stands    Consulted and Agree with Plan of Care  Patient;Family member/caregiver    Family Member Consulted  son, Legrand Como        Patient will benefit from skilled therapeutic intervention in order to improve the following deficits and impairments:  Abnormal gait, Cardiopulmonary status limiting activity, Decreased activity tolerance, Decreased balance, Decreased coordination, Decreased endurance, Decreased knowledge of use of DME, Decreased strength, Difficulty walking, Hypomobility, Increased muscle spasms, Impaired perceived functional ability, Impaired flexibility, Improper body mechanics, Postural dysfunction  Visit Diagnosis: Unsteadiness on feet  Muscle weakness (generalized)  Repeated falls  Recurrent falls  Bilateral leg weakness  Abnormality of gait  Unstable balance     Problem List Patient Active Problem List   Diagnosis Date Noted  . Parkinson's disease ([place]) 10/05/2016  . Diastolic CHF, chronic ([place]) 05/10/2015  . Dizziness   . Coronary artery disease involving coronary bypass graft of native heart without angina pectoris   . Near syncope 04/02/2015  . Diarrhea 04/02/2015  . COPD exacerbation ([place]) 06/04/2013  . SOB (shortness of breath) 06/04/2013  . Acute respiratory failure with hypoxia ([place]) 06/04/2013  . Hemoptysis 08/10/2012  . Prediabetes 08/28/2011  . Lung cancer ([place]) 08/25/2011  . COPD (chronic obstructive pulmonary disease) ([place]) 08/25/2011  . CKD (chronic kidney disease), stage III ([place]) 12/07/2010  . Chest pain 08/18/2010  . Syncope   . Tobacco abuse, in remission   . Degenerative joint disease of knee, left   . COLONIC POLYPS 05/20/2009  . Hyperlipidemia 05/20/2009  . ANEMIA 05/20/2009  . Essential tremor 05/20/2009  . ATHEROSCLEROTIC CARDIOVASCULAR DISEASE 05/20/2009  . CEREBROVASCULAR DISEASE 05/20/2009  . Essential hypertension 01/14/2009    9:46 AM,  04/23/17 Etta Grandchild, PT, DPT Physical Therapist at [facility] [phone] (office)      Etta Grandchild 04/23/2017, 9:46 AM  [place] [address] [place], [place], [address] Phone: [phone]   Fax:  [phone]  Name: CASTULO SCARPELLI MRN: [id number] Date of Birth: [birth year]

## 2017-04-27 ENCOUNTER — Ambulatory Visit (HOSPITAL_COMMUNITY): Payer: Medicare Other

## 2017-04-27 ENCOUNTER — Encounter (HOSPITAL_COMMUNITY): Payer: Self-pay

## 2017-04-27 ENCOUNTER — Telehealth (HOSPITAL_COMMUNITY): Payer: Self-pay | Admitting: Family Medicine

## 2017-04-27 DIAGNOSIS — M6281 Muscle weakness (generalized): Secondary | ICD-10-CM

## 2017-04-27 DIAGNOSIS — R2681 Unsteadiness on feet: Secondary | ICD-10-CM

## 2017-04-27 DIAGNOSIS — R296 Repeated falls: Secondary | ICD-10-CM

## 2017-04-27 NOTE — Therapy (Signed)
[place], [place], [address] Phone: [phone]   Fax:  [phone]  Physical Therapy Treatment  Patient Details  Name: [name] MRN: [id number] Date of Birth: 06-03-1921 Referring Provider: Modena Jansky, MD   Encounter Date: 04/27/2017  PT End of Session - 04/27/17 0954    Visit Number  3    Number of Visits  13    Date for PT Re-Evaluation  05/12/17    Authorization Type  [employer]HC Medicare    Authorization Time Period  04/21/17 to 06/02/17    PT Start Time  0855    PT Stop Time  0941    PT Time Calculation (min)  46 min    Equipment Utilized During Treatment  Gait belt    Activity Tolerance  Patient tolerated treatment well    Behavior During Therapy  [facility] for tasks assessed/performed       Past Medical History:  Diagnosis Date  . ASCVD (arteriosclerotic cardiovascular disease)     CABG in 04/1993; and negative stress nuclear study in 08/2001  . Benign essential tremor   . CAD (coronary artery disease)   . Cancer ([place])    skin  . Cerebrovascular disease    Right carotid bruit-40-69% left internal carotid artery stenosis in 4/06; followed [facility]  . Chronic kidney disease, stage II (mild)    Creatinine-1.6 in 09/2008  . COPD (chronic obstructive pulmonary disease) ([place])   . Degenerative joint disease of knee, left   . Gout   . Horseshoe kidney   . Hyperlipidemia   . Hypertension   . Lung cancer ([place]) 2013  . Normocytic anemia   . Prediabetes   . Pulmonary disease   . Renal insufficiency   . Syncope   . Tobacco abuse, in remission    40 pack years; discontinued in 1980    Past Surgical History:  Procedure Laterality Date  . CARDIAC SURGERY    . COLONOSCOPY W/ POLYPECTOMY  1985  . CORONARY ARTERY BYPASS GRAFT  1995  . LESION EXCISION    . PILONIDAL CYST EXCISION  1948    Vitals:    Subjective Assessment - 04/27/17 0900    Subjective  Pt. arrived with son, reports he is doing ok today,  Has  began HEP at home daily, reports fatigue and SOB at end of reps.  No reports of pain or difficulty sleeping today.      Patient Stated Goals  get better    Currently in Pain?  No/denies                      [facility] Adult PT Treatment/Exercise - 04/27/17 0001      Knee/Hip Exercises: Standing   Hip Extension  2 sets;10 reps;Both;Knee straight    SLS  c high knee marching: 2x8x3sec H       Knee/Hip Exercises: Seated   Other Seated Knee/Hip Exercises  toe raises 2x 10 reps (2nd rep with 3# BLE)    Other Seated Knee/Hip Exercises  pursed lip breathing with seated rest breaks to increased O2 sat %    Sit to Sand  2 sets;10 reps;without UE support      Knee/Hip Exercises: Supine   Bridges  Both;10 reps;2 sets          Balance Exercises - 04/27/17 0944      Balance Exercises: Standing   Standing Eyes Opened  -- catch/throw on foam NBOS; partial  tandem stance 15x each    Tandem Stance  Eyes open;Foam/compliant surface;2 reps;30 secs    Sidestepping  2 reps    Marching Limitations  3x 5 reps (reduced reps with increased rest breaks due to O2 sat%          PT Short Term Goals - 04/21/17 1246      PT SHORT TERM GOAL #1   Title  Pt will be independent with HEP and perform consistently in order to maximize overall strength and decrease risk for falls.     Time  3    Period  Weeks    Status  New    Target Date  05/12/17      PT SHORT TERM GOAL #2   Title  Pt will have 1/2 grade improvement in MMT of all muscle groups tested in order to maximize gait, balance, and decrease risk for falls.    Time  3    Period  Weeks    Status  New      PT SHORT TERM GOAL #3   Title  Pt will be able to perform bil SLS for 10 sec or > with no UE support in order to maximize gait over uneven ground.    Time  3    Period  Weeks    Status  New        PT Long Term Goals - 04/21/17 1247      PT LONG TERM GOAL #1   Title  Pt will have at least 1 grade improvement in MMT of all  muscle groups tested to maximzie transfers, gait, and allow him to complete ADLs with greater ease.     Time  6    Period  Weeks    Status  New    Target Date  06/02/17      PT LONG TERM GOAL #2   Title  Pt will be able to ambulate at least 643ft during the 3MWT with LRAD and without requiring any rest breaks to demo improved strength and endurance to improve his community access.     Time  6    Period  Weeks    Status  New      PT LONG TERM GOAL #3   Title  Pt will score 20/24 or > on the DGI to demo improved dynamic balance and decrease his risk for falls.     Time  6    Period  Weeks    Status  New      PT LONG TERM GOAL #4   Title  Pt report going to the local fitness center at least 3 days/week and performing his regular exercise routine to demo improved overall function.    Time  6    Period  Weeks    Status  New            Plan - 04/27/17 1014    Clinical Impression Statement  Continued session focus with LE strengthening and balance training.  Pt with tendency to hold breath through session especially with fatigue.  Pt educated diaphraghmatic breathing techniques to keep O2 sat % WNL.  Reduced reps this session with increased sets to keep O2 sat WNL.  Pt was limited by fatigue, no reports of pain.  O2 sat at 92% at EOS.      Rehab Potential  Fair    PT Frequency  2x / week    PT Duration  6 weeks    PT  Treatment/Interventions  ADLs/Self Care Home Management;Cryotherapy;Electrical Stimulation;Moist Heat;DME Instruction;Gait training;Stair training;Functional mobility training;Therapeutic activities;Therapeutic exercise;Balance training;Neuromuscular re-education;Patient/family education;Manual techniques;Passive range of motion;Dry needling;Taping;Energy conservation    PT Next Visit Plan  continue BLE and functional strengthening (especially of hip extensors), balance and gait training, progressing as tolerated    PT Home Exercise Plan  eval: seated heel and toe raises,  sit to stands    Family Member Consulted  son, Legrand Como        Patient will benefit from skilled therapeutic intervention in order to improve the following deficits and impairments:  Abnormal gait, Cardiopulmonary status limiting activity, Decreased activity tolerance, Decreased balance, Decreased coordination, Decreased endurance, Decreased knowledge of use of DME, Decreased strength, Difficulty walking, Hypomobility, Increased muscle spasms, Impaired perceived functional ability, Impaired flexibility, Improper body mechanics, Postural dysfunction  Visit Diagnosis: Unsteadiness on feet  Muscle weakness (generalized)  Repeated falls     Problem List Patient Active Problem List   Diagnosis Date Noted  . Parkinson's disease ([place]) 10/05/2016  . Diastolic CHF, chronic ([place]) 05/10/2015  . Dizziness   . Coronary artery disease involving coronary bypass graft of native heart without angina pectoris   . Near syncope 04/02/2015  . Diarrhea 04/02/2015  . COPD exacerbation ([place]) 06/04/2013  . SOB (shortness of breath) 06/04/2013  . Acute respiratory failure with hypoxia ([place]) 06/04/2013  . Hemoptysis 08/10/2012  . Prediabetes 08/28/2011  . Lung cancer ([place]) 08/25/2011  . COPD (chronic obstructive pulmonary disease) ([place]) 08/25/2011  . CKD (chronic kidney disease), stage III ([place]) 12/07/2010  . Chest pain 08/18/2010  . Syncope   . Tobacco abuse, in remission   . Degenerative joint disease of knee, left   . COLONIC POLYPS 05/20/2009  . Hyperlipidemia 05/20/2009  . ANEMIA 05/20/2009  . Essential tremor 05/20/2009  . ATHEROSCLEROTIC CARDIOVASCULAR DISEASE 05/20/2009  . CEREBROVASCULAR DISEASE 05/20/2009  . Essential hypertension 01/14/2009   Ihor Austin, [place]; [place]  Aldona Lento 04/27/2017, 11:06 AM  [place], [place], [address] Phone: [phone]   Fax:  [phone]  Name: [name] MRN: [id number] Date of Birth: Sep 17, 1921

## 2017-04-27 NOTE — Addendum Note (Signed)
Addended by: Dairl Ponder on: 04/27/2017 11:17 AM   Modules accepted: Orders

## 2017-04-27 NOTE — Telephone Encounter (Signed)
04/27/17  son called to see about changing this appt to another day but the times we have aren't going to work... will add to the wait list for 1/2

## 2017-04-30 ENCOUNTER — Other Ambulatory Visit: Payer: Self-pay

## 2017-04-30 ENCOUNTER — Encounter (HOSPITAL_COMMUNITY): Payer: Self-pay | Admitting: Physical Therapy

## 2017-04-30 ENCOUNTER — Ambulatory Visit (HOSPITAL_COMMUNITY): Payer: Medicare Other | Admitting: Physical Therapy

## 2017-04-30 ENCOUNTER — Telehealth: Payer: Self-pay | Admitting: Family Medicine

## 2017-04-30 DIAGNOSIS — R296 Repeated falls: Secondary | ICD-10-CM

## 2017-04-30 DIAGNOSIS — R2681 Unsteadiness on feet: Secondary | ICD-10-CM | POA: Diagnosis not present

## 2017-04-30 DIAGNOSIS — M6281 Muscle weakness (generalized): Secondary | ICD-10-CM

## 2017-04-30 NOTE — Telephone Encounter (Signed)
Although I applaud his efforts at exercising it is important for him to not over stress himself.  I would also recommend for his son to check his dad's oxygen level when he is sitting still then have his dad walk for 1-2 minutes then check oxygen again if it is dropped into the 70s it would be wise to schedule a follow-up office visit

## 2017-04-30 NOTE — Therapy (Addendum)
[place], [place], [address] Phone: [phone]   Fax:  [phone]  Physical Therapy Treatment  Patient Details  Name: [name] MRN: [id number] Date of Birth: 11/30/1921 Referring Provider: Modena Jansky, MD   Encounter Date: 04/30/2017  PT End of Session - 04/30/17 1049    Visit Number  4    Number of Visits  13    Date for PT Re-Evaluation  05/12/17    Authorization Type  [employer] Medicare    Authorization Time Period  04/21/17 to 06/02/17    PT Start Time  0948    PT Stop Time  1030    PT Time Calculation (min)  42 min    Equipment Utilized During Treatment  Gait belt    Activity Tolerance  Patient tolerated treatment well;Other (comment) Patient limited by oxygen saturation    Behavior During Therapy  [facility] for tasks assessed/performed       Past Medical History:  Diagnosis Date  . ASCVD (arteriosclerotic cardiovascular disease)     CABG in 04/1993; and negative stress nuclear study in 08/2001  . Benign essential tremor   . CAD (coronary artery disease)   . Cancer ([place])    skin  . Cerebrovascular disease    Right carotid bruit-40-69% left internal carotid artery stenosis in 4/06; followed [facility]  . Chronic kidney disease, stage II (mild)    Creatinine-1.6 in 09/2008  . COPD (chronic obstructive pulmonary disease) ([place])   . Degenerative joint disease of knee, left   . Gout   . Horseshoe kidney   . Hyperlipidemia   . Hypertension   . Lung cancer ([place]) 2013  . Normocytic anemia   . Prediabetes   . Pulmonary disease   . Renal insufficiency   . Syncope   . Tobacco abuse, in remission    40 pack years; discontinued in 1980    Past Surgical History:  Procedure Laterality Date  . CARDIAC SURGERY    . COLONOSCOPY W/ POLYPECTOMY  1985  . CORONARY ARTERY BYPASS GRAFT  1995  . LESION EXCISION    . PILONIDAL CYST EXCISION  1948    There were no vitals filed for this visit.  Subjective Assessment -  04/30/17 1046    Subjective  Patient arrived with son, Legrand Como this session and reported he is doing well. Patient denied any pain during session. Patient's son stated he would call the physician about the patient's oxygen saturation and follow-up with physical therapy.     Patient is accompained by:  Family member Son    How long can you stand comfortably?  no real issues    How long can you walk comfortably?  5 mins before getting SOB    Patient Stated Goals  get better    Currently in Pain?  No/denies                      [facility] Adult PT Treatment/Exercise - 04/30/17 0001      Knee/Hip Exercises: Standing   Hip Extension  2 sets;10 reps;Both;Knee straight    SLS  c high knee marching: 2x8x3sec H     Other Standing Knee Exercises  Sidestepping 8 feet x 2 each direction with rest breaks as needed    Other Standing Knee Exercises  Standing with semi-tandem stance on foam throwing volleyball 1 x 15 each lower extremity forward with rest breaks as needed      Knee/Hip  Exercises: Seated   Other Seated Knee/Hip Exercises  Heel raises 2 x 5; toe raises 2 x 5 without weight    Other Seated Knee/Hip Exercises  Education on pursed lip breathing and diaphragmatic breathing throughout session x 8 minutes    Sit to Sand  2 sets;10 reps;without UE support             PT Education - 04/30/17 1048    Education provided  Yes    Education Details  Patient was educated about diaphragmatic breathing and pursed lip breathing this session, and discussed how positional changes may affect oxygen saturation.     Person(s) Educated  Patient;Child(ren)    Methods  Explanation;Demonstration    Comprehension  Need further instruction;Verbal cues required       PT Short Term Goals - 04/21/17 1246      PT SHORT TERM GOAL #1   Title  Pt will be independent with HEP and perform consistently in order to maximize overall strength and decrease risk for falls.     Time  3    Period  Weeks     Status  New    Target Date  05/12/17      PT SHORT TERM GOAL #2   Title  Pt will have 1/2 grade improvement in MMT of all muscle groups tested in order to maximize gait, balance, and decrease risk for falls.    Time  3    Period  Weeks    Status  New      PT SHORT TERM GOAL #3   Title  Pt will be able to perform bil SLS for 10 sec or > with no UE support in order to maximize gait over uneven ground.    Time  3    Period  Weeks    Status  New        PT Long Term Goals - 04/21/17 1247      PT LONG TERM GOAL #1   Title  Pt will have at least 1 grade improvement in MMT of all muscle groups tested to maximzie transfers, gait, and allow him to complete ADLs with greater ease.     Time  6    Period  Weeks    Status  New    Target Date  06/02/17      PT LONG TERM GOAL #2   Title  Pt will be able to ambulate at least 681ft during the 3MWT with LRAD and without requiring any rest breaks to demo improved strength and endurance to improve his community access.     Time  6    Period  Weeks    Status  New      PT LONG TERM GOAL #3   Title  Pt will score 20/24 or > on the DGI to demo improved dynamic balance and decrease his risk for falls.     Time  6    Period  Weeks    Status  New      PT LONG TERM GOAL #4   Title  Pt report going to the local fitness center at least 3 days/week and performing his regular exercise routine to demo improved overall function.    Time  6    Period  Weeks    Status  New            Plan - 04/30/17 1050    Clinical Impression Statement  This session continued to focus on lower extremity  strengthening with an added focus on education about pursed lip and diaphragmatic breathing and with higher sets and less repetitions to allow for breaks. Patient's oxygen saturation was monitored frequently throughout session. Patient's oxygen saturation was lower when laying supine, therefore discontinued bridging exercise this session. Rest breaks were provided  frequently and always allowed oxygen saturation to reach at least 94% before beginning exercises again. At one point at the end of the session patient's oxygen saturation read 71%, activity was discontinued immediately and patient was returned to sitting where his oxygen saturation was found to be 98% again within moments. Discussed with patient's son that he should follow-up with the patient's physician and let him know the variation in oxygen saturation and inform physical therapy of his response. Overall, patient tolerated all exercises well and patient did not demonstrate signs of overexertion or complain of shortness of breath. Sidestepping and hip abduction in standing was performed this session and patient was cued to keep feet facing forward with this. Plan to follow up with patient's son about response from physician.     History and Personal Factors relevant to plan of care:  Parkinson's Disease; COPD, CAD, ASCVD, HTN, Cerebrovascular disease, gout, hyperlipidemia    Rehab Potential  Fair    PT Frequency  2x / week    PT Duration  6 weeks    PT Treatment/Interventions  ADLs/Self Care Home Management;Cryotherapy;Electrical Stimulation;Moist Heat;DME Instruction;Gait training;Stair training;Functional mobility training;Therapeutic activities;Therapeutic exercise;Balance training;Neuromuscular re-education;Patient/family education;Manual techniques;Passive range of motion;Dry needling;Taping;Energy conservation    PT Next Visit Plan  Follow-up with son about physician's response about patient's oxygen saturation; Discontinue supine exercises if oxygen saturation is found to be low again, progress with lower extremity strengthening as tolerated continue to monitor O2 saturation    PT Home Exercise Plan  eval: seated heel and toe raises, sit to stands    Consulted and Agree with Plan of Care  Patient;Family member/caregiver    Family Member Consulted  son, Legrand Como        Patient will benefit from  skilled therapeutic intervention in order to improve the following deficits and impairments:  Abnormal gait, Cardiopulmonary status limiting activity, Decreased activity tolerance, Decreased balance, Decreased coordination, Decreased endurance, Decreased knowledge of use of DME, Decreased strength, Difficulty walking, Hypomobility, Increased muscle spasms, Impaired perceived functional ability, Impaired flexibility, Improper body mechanics, Postural dysfunction  Visit Diagnosis: Unsteadiness on feet  Muscle weakness (generalized)  Repeated falls     Problem List Patient Active Problem List   Diagnosis Date Noted  . Parkinson's disease ([place]) 10/05/2016  . Diastolic CHF, chronic ([place]) 05/10/2015  . Dizziness   . Coronary artery disease involving coronary bypass graft of native heart without angina pectoris   . Near syncope 04/02/2015  . Diarrhea 04/02/2015  . COPD exacerbation ([place]) 06/04/2013  . SOB (shortness of breath) 06/04/2013  . Acute respiratory failure with hypoxia ([place]) 06/04/2013  . Hemoptysis 08/10/2012  . Prediabetes 08/28/2011  . Lung cancer ([place]) 08/25/2011  . COPD (chronic obstructive pulmonary disease) ([place]) 08/25/2011  . CKD (chronic kidney disease), stage III ([place]) 12/07/2010  . Chest pain 08/18/2010  . Syncope   . Tobacco abuse, in remission   . Degenerative joint disease of knee, left   . COLONIC POLYPS 05/20/2009  . Hyperlipidemia 05/20/2009  . ANEMIA 05/20/2009  . Essential tremor 05/20/2009  . ATHEROSCLEROTIC CARDIOVASCULAR DISEASE 05/20/2009  . CEREBROVASCULAR DISEASE 05/20/2009  . Essential hypertension 01/14/2009    Clarene Critchley PT, DPT 12:33 PM, 04/30/17 [phone]  [place], [place], [address] Phone: [phone]   Fax:  [phone]  Name: [name] MRN: [id number] Date of Birth: [birth year]

## 2017-04-30 NOTE — Telephone Encounter (Signed)
Son stated that his father is doing PT and when he is doing the strenuous exercises his O2  decreases to upper 70s to lower 803 but it goes back up when he rests and they said to notify Dr Nicki Reaper. Is it ok to continue

## 2017-04-30 NOTE — Telephone Encounter (Signed)
Son(POA) advised Dr Nicki Reaper feels it would be wise to cut back on the strenuous nature of the exercise.  If he has further troubles or ongoing issues face-to-face discussion would be best. Son verbalized understanding

## 2017-04-30 NOTE — Telephone Encounter (Signed)
It would be wise to cut back on the strenuous nature of the exercise.  If he has further troubles or ongoing issues face-to-face discussion would be best

## 2017-04-30 NOTE — Telephone Encounter (Signed)
When patient does certain exercises at physical therapy, his oxygen levels are dropping to about 71-72.  It goes back up to the 80's and low 90's when he is done, but Ronalee Belts wanted to let [name] know this is happening and what he thinks.

## 2017-05-04 ENCOUNTER — Ambulatory Visit (HOSPITAL_COMMUNITY): Payer: Medicare Other | Admitting: Physical Therapy

## 2017-05-04 DIAGNOSIS — M6281 Muscle weakness (generalized): Secondary | ICD-10-CM

## 2017-05-04 DIAGNOSIS — R2681 Unsteadiness on feet: Secondary | ICD-10-CM | POA: Diagnosis not present

## 2017-05-04 DIAGNOSIS — R296 Repeated falls: Secondary | ICD-10-CM

## 2017-05-04 NOTE — Therapy (Signed)
[place], [place], [address] Phone: [phone]   Fax:  [phone]  Physical Therapy Treatment  Patient Details  Name: [name] MRN: [id number] Date of Birth: [birth year] Referring Provider: Modena Jansky, MD   Encounter Date: 05/04/2017  PT End of Session - 05/04/17 1448    Visit Number  5    Number of Visits  13    Date for PT Re-Evaluation  05/12/17    Authorization Type  [employer]HC Medicare    Authorization Time Period  04/21/17 to 06/02/17    PT Start Time  0904    PT Stop Time  0945    PT Time Calculation (min)  41 min    Equipment Utilized During Treatment  Gait belt    Activity Tolerance  Patient tolerated treatment well;Other (comment) Patient limited by oxygen saturation    Behavior During Therapy  [facility] for tasks assessed/performed       Past Medical History:  Diagnosis Date  . ASCVD (arteriosclerotic cardiovascular disease)     CABG in 04/1993; and negative stress nuclear study in 08/2001  . Benign essential tremor   . CAD (coronary artery disease)   . Cancer ([place])    skin  . Cerebrovascular disease    Right carotid bruit-40-69% left internal carotid artery stenosis in 4/06; followed [facility]  . Chronic kidney disease, stage II (mild)    Creatinine-1.6 in 09/2008  . COPD (chronic obstructive pulmonary disease) ([place])   . Degenerative joint disease of knee, left   . Gout   . Horseshoe kidney   . Hyperlipidemia   . Hypertension   . Lung cancer ([place]) 2013  . Normocytic anemia   . Prediabetes   . Pulmonary disease   . Renal insufficiency   . Syncope   . Tobacco abuse, in remission    40 pack years; discontinued in 1980    Past Surgical History:  Procedure Laterality Date  . CARDIAC SURGERY    . COLONOSCOPY W/ POLYPECTOMY  1985  . CORONARY ARTERY BYPASS GRAFT  1995  . LESION EXCISION    . PILONIDAL CYST EXCISION  1948    There were no vitals filed for this visit.  Subjective Assessment -  05/04/17 1506    Subjective  son with patient today Legrand Como) who reports MD advised to discontinue strenuous exercises causing his O2 sats to drop.  Pt reports no pattern as to when/what activities cause drops in sats.  [facility]ues to [facility] [employer] to [facility] the bike.  No pain.                      [place] Adult PT Treatment/Exercise - 05/04/17 0001      Knee/Hip Exercises: Standing   Heel Raises  Both;10 reps    SLS  high marching 10X 2" holds with 1 HHA    Gait Training  200 feet, O2 taken prior at 98%, after at 97%    Other Standing Knee Exercises  tandem stance on foam with UE flexion with 2# bar 5 reps each      Knee/Hip Exercises: Seated   Sit to Sand  1 set;10 reps      Knee/Hip Exercises: Supine   Bridges  Both;10 reps;2 sets    Other Supine Knee/Hip Exercises  marching, BNR 10 reps each alternating      Knee/Hip Exercises: Sidelying   Hip ABduction  Both;2 sets;10 reps Rt harder  PT Short Term Goals - 04/21/17 1246      PT SHORT TERM GOAL #1   Title  Pt will be independent with HEP and perform consistently in order to maximize overall strength and decrease risk for falls.     Time  3    Period  Weeks    Status  New    Target Date  05/12/17      PT SHORT TERM GOAL #2   Title  Pt will have 1/2 grade improvement in MMT of all muscle groups tested in order to maximize gait, balance, and decrease risk for falls.    Time  3    Period  Weeks    Status  New      PT SHORT TERM GOAL #3   Title  Pt will be able to perform bil SLS for 10 sec or > with no UE support in order to maximize gait over uneven ground.    Time  3    Period  Weeks    Status  New        PT Long Term Goals - 04/21/17 1247      PT LONG TERM GOAL #1   Title  Pt will have at least 1 grade improvement in MMT of all muscle groups tested to maximzie transfers, gait, and allow him to complete ADLs with greater ease.     Time  6    Period  Weeks    Status  New     Target Date  06/02/17      PT LONG TERM GOAL #2   Title  Pt will be able to ambulate at least 622ft during the 3MWT with LRAD and without requiring any rest breaks to demo improved strength and endurance to improve his community access.     Time  6    Period  Weeks    Status  New      PT LONG TERM GOAL #3   Title  Pt will score 20/24 or > on the DGI to demo improved dynamic balance and decrease his risk for falls.     Time  6    Period  Weeks    Status  New      PT LONG TERM GOAL #4   Title  Pt report going to the local fitness center at least 3 days/week and performing his regular exercise routine to demo improved overall function.    Time  6    Period  Weeks    Status  New            Plan - 05/04/17 1451    Clinical Impression Statement  continued with focus on LE strengthening tolerance to activity as related to oxygen saturation.  Son contacted MD who requested pateint discontinue any rigorous actvities which cause his sats to drop.  Monitored sats during session, rmaining relatively within therapeutic range above 95% with only two drops following sit to stand activity to 79% and 88% after attempting SLS.  Sats remained 98-99% at rest, only dropped to 97% following 2 minutes of ambualtion.  Advised son to continue to monitor O2 sats at home with activity.      Rehab Potential  Fair    PT Frequency  2x / week    PT Duration  6 weeks    PT Treatment/Interventions  ADLs/Self Care Home Management;Cryotherapy;Electrical Stimulation;Moist Heat;DME Instruction;Gait training;Stair training;Functional mobility training;Therapeutic activities;Therapeutic exercise;Balance training;Neuromuscular re-education;Patient/family education;Manual techniques;Passive range of motion;Dry needling;Taping;Energy conservation  PT Next Visit Plan  discontinue exercises that repeatedly decrease oxygen saturation. Progress with lower extremity strengthening as tolerated while continuing to monitor O2  saturation    PT Home Exercise Plan  eval: seated heel and toe raises, sit to stands    Consulted and Agree with Plan of Care  Patient;Family member/caregiver    Family Member Consulted  son, Legrand Como        Patient will benefit from skilled therapeutic intervention in order to improve the following deficits and impairments:  Abnormal gait, Cardiopulmonary status limiting activity, Decreased activity tolerance, Decreased balance, Decreased coordination, Decreased endurance, Decreased knowledge of use of DME, Decreased strength, Difficulty walking, Hypomobility, Increased muscle spasms, Impaired perceived functional ability, Impaired flexibility, Improper body mechanics, Postural dysfunction  Visit Diagnosis: Unsteadiness on feet  Muscle weakness (generalized)  Repeated falls     Problem List Patient Active Problem List   Diagnosis Date Noted  . Parkinson's disease ([place]) 10/05/2016  . Diastolic CHF, chronic ([place]) 05/10/2015  . Dizziness   . Coronary artery disease involving coronary bypass graft of native heart without angina pectoris   . Near syncope 04/02/2015  . Diarrhea 04/02/2015  . COPD exacerbation ([place]) 06/04/2013  . SOB (shortness of breath) 06/04/2013  . Acute respiratory failure with hypoxia ([place]) 06/04/2013  . Hemoptysis 08/10/2012  . Prediabetes 08/28/2011  . Lung cancer ([place]) 08/25/2011  . COPD (chronic obstructive pulmonary disease) ([place]) 08/25/2011  . CKD (chronic kidney disease), stage III ([place]) 12/07/2010  . Chest pain 08/18/2010  . Syncope   . Tobacco abuse, in remission   . Degenerative joint disease of knee, left   . COLONIC POLYPS 05/20/2009  . Hyperlipidemia 05/20/2009  . ANEMIA 05/20/2009  . Essential tremor 05/20/2009  . ATHEROSCLEROTIC CARDIOVASCULAR DISEASE 05/20/2009  . CEREBROVASCULAR DISEASE 05/20/2009  . Essential hypertension 01/14/2009   Teena Irani, PTA/CLT [phone]  Teena Irani 05/04/2017, 3:08 PM  [place] [address] [place], [place], [address] Phone: [phone]   Fax:  [phone]  Name: [name] MRN: [id number] Date of Birth: [birth year]

## 2017-05-06 ENCOUNTER — Ambulatory Visit (HOSPITAL_COMMUNITY): Payer: Medicare Other

## 2017-05-07 ENCOUNTER — Encounter (HOSPITAL_COMMUNITY): Payer: Self-pay

## 2017-05-07 ENCOUNTER — Ambulatory Visit (HOSPITAL_COMMUNITY): Payer: Medicare Other

## 2017-05-07 DIAGNOSIS — M6281 Muscle weakness (generalized): Secondary | ICD-10-CM

## 2017-05-07 DIAGNOSIS — R2681 Unsteadiness on feet: Secondary | ICD-10-CM | POA: Diagnosis not present

## 2017-05-07 DIAGNOSIS — R296 Repeated falls: Secondary | ICD-10-CM

## 2017-05-07 NOTE — Therapy (Signed)
[place], [place], [address] Phone: [phone]   Fax:  [phone]  Physical Therapy Treatment  Patient Details  Name: [name] MRN: [id number] Date of Birth: 05-Apr-1922 Referring Provider: Modena Jansky, MD   Encounter Date: 05/07/2017  PT End of Session - 05/07/17 1256    Visit Number  6    Number of Visits  13    Date for PT Re-Evaluation  05/12/17    Authorization Type  UHC Medicare    Authorization Time Period  04/21/17 to 06/02/17    PT Start Time  0947    PT Stop Time  1030    PT Time Calculation (min)  43 min    Equipment Utilized During Treatment  Gait belt    Activity Tolerance  Patient tolerated treatment well;Other (comment) Patient limited by oxygen saturation    Behavior During Therapy  [facility] for tasks assessed/performed       Past Medical History:  Diagnosis Date  . ASCVD (arteriosclerotic cardiovascular disease)     CABG in 04/1993; and negative stress nuclear study in 08/2001  . Benign essential tremor   . CAD (coronary artery disease)   . Cancer ([place])    skin  . Cerebrovascular disease    Right carotid bruit-40-69% left internal carotid artery stenosis in 4/06; followed [facility]  . Chronic kidney disease, stage II (mild)    Creatinine-1.6 in 09/2008  . COPD (chronic obstructive pulmonary disease) ([place])   . Degenerative joint disease of knee, left   . Gout   . Horseshoe kidney   . Hyperlipidemia   . Hypertension   . Lung cancer ([place]) 2013  . Normocytic anemia   . Prediabetes   . Pulmonary disease   . Renal insufficiency   . Syncope   . Tobacco abuse, in remission    40 pack years; discontinued in 1980    Past Surgical History:  Procedure Laterality Date  . CARDIAC SURGERY    . COLONOSCOPY W/ POLYPECTOMY  1985  . CORONARY ARTERY BYPASS GRAFT  1995  . LESION EXCISION    . PILONIDAL CYST EXCISION  1948    There were no vitals filed for this visit.  Subjective Assessment -  05/07/17 0946    Subjective  Daughter with patient today Freida Busman) who reports MD advised to purchase a pulse oximeter to check O2 sats at home. Pt reports he usually is not able to tell when his O2 sats are low but does get a little light headed sometimes. No pain.    Patient is accompained by:  Family member                      [place] [facility] PT Treatment/Exercise - 05/07/17 0001      Knee/Hip Exercises: Standing   Heel Raises  Both;2 sets;5 reps    Hip Extension  2 sets;Both;Knee straight;5 reps    Stairs  4RT with only rail on lt going up and rt going down    SLS  high marching 2 sets 5x each leg 2" holds with 1 HHA    Gait Training  250 feet O2 sat 76 post amb.    Other Standing Knee Exercises  Sidestepping 8 feet x 2 each direction with rest breaks as needed    Other Standing Knee Exercises  tandem stance on foam with UE flexion with 2# bar 5 reps each  Balance Exercises - 05/07/17 1021      Balance Exercises: Standing   Standing Eyes Opened  Narrow base of support (BOS);Foam/compliant surface;Cognitive challenge basketball toss/catch 15x fwd, 45* Rt, 45* Lt.     Tandem Stance  Eyes open;Foam/compliant surface;2 reps;30 secs    Balance Beam  blue line on floor 1RT    Sidestepping  2 reps    Marching Limitations  2 sets 5 reps checking O2 sats        PT Education - 05/07/17 1254    Education provided  Yes    Education Details  Patient and daughter educated on using the pulse oximeter to help patient learn/sense when his O2 sats are getting low so he can sit and rest more safely.    Person(s) Educated  Patient;Child(ren)    Methods  Explanation    Comprehension  Verbalized understanding       PT Short Term Goals - 05/07/17 1301      PT SHORT TERM GOAL #1   Title  Pt will be independent with HEP and perform consistently in order to maximize overall strength and decrease risk for falls.     Status  On-going      PT SHORT TERM GOAL #2   Title  Pt  will have 1/2 grade improvement in MMT of all muscle groups tested in order to maximize gait, balance, and decrease risk for falls.    Status  On-going      PT SHORT TERM GOAL #3   Title  Pt will be able to perform bil SLS for 10 sec or > with no UE support in order to maximize gait over uneven ground.    Status  On-going      PT SHORT TERM GOAL #4   Title  Pt to be able to verbalize the importance of posture in maintaining balance.     Status  On-going        PT Long Term Goals - 05/07/17 1302      PT LONG TERM GOAL #1   Title  Pt will have at least 1 grade improvement in MMT of all muscle groups tested to maximzie transfers, gait, and allow him to complete ADLs with greater ease.     Status  On-going      PT LONG TERM GOAL #2   Title  Pt will be able to ambulate at least 679ft during the 3MWT with LRAD and without requiring any rest breaks to demo improved strength and endurance to improve his community access.     Status  On-going      PT LONG TERM GOAL #3   Title  Pt will score 20/24 or > on the DGI to demo improved dynamic balance and decrease his risk for falls.     Status  On-going      PT LONG TERM GOAL #4   Title  Pt report going to the local fitness center at least 3 days/week and performing his regular exercise routine to demo improved overall function.    Status  On-going      PT LONG TERM GOAL #5   Title  Pt to report that he has been able to ambulate over grass and pick up twigs and branches without an assistive device with confidence.     Status  On-going            Plan - 05/07/17 1257    Clinical Impression Statement  Patient tolerated fairly well but desaturated  to as low as 76% with ambulation and exercises inconsistently. Some exercises required set of 5 while others he was able to complete 2 sets of 10 before desaturation. Advised daughter and son to continue monitoring O2 sats at home.    History and Personal Factors relevant to plan of care:   Parkinson's Disease, COPD, CAD, ASCVD, HTN, CVD, gout, hyperlipidemia    Rehab Potential  Fair    PT Frequency  2x / week    PT Duration  6 weeks    PT Treatment/Interventions  ADLs/Self Care Home Management;Cryotherapy;Electrical Stimulation;Moist Heat;DME Instruction;Gait training;Stair training;Functional mobility training;Therapeutic activities;Therapeutic exercise;Balance training;Neuromuscular re-education;Patient/family education;Manual techniques;Passive range of motion;Dry needling;Taping;Energy conservation    PT Next Visit Plan  discontinue exercises that repeatedly decrease oxygen saturation. Progress with lower extremity strengthening as tolerated while continuing to monitor O2 saturation    PT Home Exercise Plan  eval: seated heel and toe raises, sit to stands; 05/07/2017 - up/down stairs with railing using rediprocal pattern    Consulted and Agree with Plan of Care  Patient;Family member/caregiver    Family Member Consulted  daughter, Freida Busman       Patient will benefit from skilled therapeutic intervention in order to improve the following deficits and impairments:  Abnormal gait, Cardiopulmonary status limiting activity, Decreased activity tolerance, Decreased balance, Decreased coordination, Decreased endurance, Decreased knowledge of use of DME, Decreased strength, Difficulty walking, Hypomobility, Increased muscle spasms, Impaired perceived functional ability, Impaired flexibility, Improper body mechanics, Postural dysfunction  Visit Diagnosis: Unsteadiness on feet  Muscle weakness (generalized)  Repeated falls     Problem List Patient Active Problem List   Diagnosis Date Noted  . Parkinson's disease ([place]) 10/05/2016  . Diastolic CHF, chronic ([place]) 05/10/2015  . Dizziness   . Coronary artery disease involving coronary bypass graft of native heart without angina pectoris   . Near syncope 04/02/2015  . Diarrhea 04/02/2015  . COPD exacerbation ([place]) 06/04/2013  . SOB  (shortness of breath) 06/04/2013  . Acute respiratory failure with hypoxia ([place]) 06/04/2013  . Hemoptysis 08/10/2012  . Prediabetes 08/28/2011  . Lung cancer ([place]) 08/25/2011  . COPD (chronic obstructive pulmonary disease) ([place]) 08/25/2011  . CKD (chronic kidney disease), stage III ([place]) 12/07/2010  . Chest pain 08/18/2010  . Syncope   . Tobacco abuse, in remission   . Degenerative joint disease of knee, left   . COLONIC POLYPS 05/20/2009  . Hyperlipidemia 05/20/2009  . ANEMIA 05/20/2009  . Essential tremor 05/20/2009  . ATHEROSCLEROTIC CARDIOVASCULAR DISEASE 05/20/2009  . CEREBROVASCULAR DISEASE 05/20/2009  . Essential hypertension 01/14/2009    Floria Raveling. Hartnett-Rands, MS, PT Per [place] #[id number] 05/07/2017, 1:10 PM  [place] [address] [place], [place], [address] Phone: [phone]   Fax:  [phone]  Name: [name] MRN: [id number] Date of Birth: 1921-09-14

## 2017-05-11 ENCOUNTER — Ambulatory Visit (HOSPITAL_COMMUNITY): Payer: Medicare Other

## 2017-05-11 ENCOUNTER — Encounter (HOSPITAL_COMMUNITY): Payer: Self-pay

## 2017-05-11 DIAGNOSIS — R296 Repeated falls: Secondary | ICD-10-CM

## 2017-05-11 DIAGNOSIS — R2681 Unsteadiness on feet: Secondary | ICD-10-CM

## 2017-05-11 DIAGNOSIS — M6281 Muscle weakness (generalized): Secondary | ICD-10-CM

## 2017-05-11 LAB — PTH, INTACT AND CALCIUM
CALCIUM: 8.6 mg/dL (ref 8.6–10.2)
PTH: 154 pg/mL — AB (ref 15–65)

## 2017-05-11 LAB — VITAMIN D 25 HYDROXY (VIT D DEFICIENCY, FRACTURES): Vit D, 25-Hydroxy: 25.3 ng/mL — ABNORMAL LOW (ref 30.0–100.0)

## 2017-05-11 LAB — ALKALINE PHOSPHATASE: Alkaline Phosphatase: 88 IU/L (ref 39–117)

## 2017-05-11 LAB — MAGNESIUM: MAGNESIUM: 2.5 mg/dL — AB (ref 1.6–2.3)

## 2017-05-11 NOTE — Therapy (Signed)
[place], [place], [address] Phone: [phone]   Fax:  [phone]  Physical Therapy Treatment  Patient Details  Name: [name] MRN: [id number] Date of Birth: 18-Sep-1921 Referring Provider: Modena Jansky, MD   Encounter Date: 05/11/2017  PT End of Session - 05/11/17 0945    Visit Number  7    Number of Visits  13    Date for PT Re-Evaluation  05/12/17    Authorization Type  UHC Medicare    Authorization Time Period  04/21/17 to 06/02/17    PT Start Time  0905    PT Stop Time  0945    PT Time Calculation (min)  40 min    Equipment Utilized During Treatment  Gait belt    Activity Tolerance  Patient tolerated treatment well;Other (comment) Limited by Oxygen saturation range, dropped to 82 during gait and stayed 86-95% during therex, monitored through session    Behavior During Therapy  [facility] for tasks assessed/performed       Past Medical History:  Diagnosis Date  . ASCVD (arteriosclerotic cardiovascular disease)     CABG in 04/1993; and negative stress nuclear study in 08/2001  . Benign essential tremor   . CAD (coronary artery disease)   . Cancer ([place])    skin  . Cerebrovascular disease    Right carotid bruit-40-69% left internal carotid artery stenosis in 4/06; followed [facility]  . Chronic kidney disease, stage II (mild)    Creatinine-1.6 in 09/2008  . COPD (chronic obstructive pulmonary disease) ([place])   . Degenerative joint disease of knee, left   . Gout   . Horseshoe kidney   . Hyperlipidemia   . Hypertension   . Lung cancer ([place]) 2013  . Normocytic anemia   . Prediabetes   . Pulmonary disease   . Renal insufficiency   . Syncope   . Tobacco abuse, in remission    40 pack years; discontinued in 1980    Past Surgical History:  Procedure Laterality Date  . CARDIAC SURGERY    . COLONOSCOPY W/ POLYPECTOMY  1985  . CORONARY ARTERY BYPASS GRAFT  1995  . LESION EXCISION    . PILONIDAL CYST EXCISION   1948    There were no vitals filed for this visit.  Subjective Assessment - 05/11/17 0912    Subjective  Pt stated he is feeling good today, no reports of pain or recent falls.  Reports he usually is unable to tell when his O2 sat are low but does have some light headness sometimes.    Patient Stated Goals  get better    Currently in Pain?  No/denies           Balance Exercises - 05/11/17 0935      Balance Exercises: Standing   Tandem Stance  Eyes open;Foam/compliant surface;3 reps;30 secs    SLS  Eyes open;5 reps cone taps 5x each LE    Tandem Gait  2 reps    Retro Gait  2 reps    Sidestepping  2 reps;Theraband    Cone Rotation  Foam/compliant surface;Right turn;Left turn    Marching Limitations  2 sets 5 reps checking O2 sats    Heel Raises Limitations  2x 5    Toe Raise Limitations  2x 5          PT Short Term Goals - 05/07/17 1301      PT SHORT TERM GOAL #1   Title  Pt will be independent with HEP and perform consistently in order to maximize overall strength and decrease risk for falls.     Status  On-going      PT SHORT TERM GOAL #2   Title  Pt will have 1/2 grade improvement in MMT of all muscle groups tested in order to maximize gait, balance, and decrease risk for falls.    Status  On-going      PT SHORT TERM GOAL #3   Title  Pt will be able to perform bil SLS for 10 sec or > with no UE support in order to maximize gait over uneven ground.    Status  On-going      PT SHORT TERM GOAL #4   Title  Pt to be able to verbalize the importance of posture in maintaining balance.     Status  On-going        PT Long Term Goals - 05/07/17 1302      PT LONG TERM GOAL #1   Title  Pt will have at least 1 grade improvement in MMT of all muscle groups tested to maximzie transfers, gait, and allow him to complete ADLs with greater ease.     Status  On-going      PT LONG TERM GOAL #2   Title  Pt will be able to ambulate at least 629ft during the 3MWT with LRAD and  without requiring any rest breaks to demo improved strength and endurance to improve his community access.     Status  On-going      PT LONG TERM GOAL #3   Title  Pt will score 20/24 or > on the DGI to demo improved dynamic balance and decrease his risk for falls.     Status  On-going      PT LONG TERM GOAL #4   Title  Pt report going to the local fitness center at least 3 days/week and performing his regular exercise routine to demo improved overall function.    Status  On-going      PT LONG TERM GOAL #5   Title  Pt to report that he has been able to ambulate over grass and pick up twigs and branches without an assistive device with confidence.     Status  On-going            Plan - 05/11/17 0955    Clinical Impression Statement  Session focus on improving awareness with O2 saturation and balance training.  Session complete with multiple sets and reduced reps with ability to keep O2 saturation between 86-95%.   Pt educated on importance of improving awareness of  O2 saturation, reviewed s/s with SOB and decreased O2 sat.    Rehab Potential  Fair    PT Frequency  2x / week    PT Duration  6 weeks    PT Treatment/Interventions  ADLs/Self Care Home Management;Cryotherapy;Electrical Stimulation;Moist Heat;DME Instruction;Gait training;Stair training;Functional mobility training;Therapeutic activities;Therapeutic exercise;Balance training;Neuromuscular re-education;Patient/family education;Manual techniques;Passive range of motion;Dry needling;Taping;Energy conservation    PT Next Visit Plan  discontinue exercises that repeatedly decrease oxygen saturation. Progress with lower extremity strengthening as tolerated while continuing to monitor O2 saturation    PT Home Exercise Plan  eval: seated heel and toe raises, sit to stands; 05/07/2017 - up/down stairs with railing using rediprocal pattern    Consulted and Agree with Plan of Care  Family member/caregiver    Family Member Consulted  Son  Patient will benefit from skilled therapeutic intervention in order to improve the following deficits and impairments:  Abnormal gait, Cardiopulmonary status limiting activity, Decreased activity tolerance, Decreased balance, Decreased coordination, Decreased endurance, Decreased knowledge of use of DME, Decreased strength, Difficulty walking, Hypomobility, Increased muscle spasms, Impaired perceived functional ability, Impaired flexibility, Improper body mechanics, Postural dysfunction  Visit Diagnosis: Unsteadiness on feet  Muscle weakness (generalized)  Repeated falls     Problem List Patient Active Problem List   Diagnosis Date Noted  . Parkinson's disease ([place]) 10/05/2016  . Diastolic CHF, chronic ([place]) 05/10/2015  . Dizziness   . Coronary artery disease involving coronary bypass graft of native heart without angina pectoris   . Near syncope 04/02/2015  . Diarrhea 04/02/2015  . COPD exacerbation ([place]) 06/04/2013  . SOB (shortness of breath) 06/04/2013  . Acute respiratory failure with hypoxia ([place]) 06/04/2013  . Hemoptysis 08/10/2012  . Prediabetes 08/28/2011  . Lung cancer ([place]) 08/25/2011  . COPD (chronic obstructive pulmonary disease) ([place]) 08/25/2011  . CKD (chronic kidney disease), stage III ([place]) 12/07/2010  . Chest pain 08/18/2010  . Syncope   . Tobacco abuse, in remission   . Degenerative joint disease of knee, left   . COLONIC POLYPS 05/20/2009  . Hyperlipidemia 05/20/2009  . ANEMIA 05/20/2009  . Essential tremor 05/20/2009  . ATHEROSCLEROTIC CARDIOVASCULAR DISEASE 05/20/2009  . CEREBROVASCULAR DISEASE 05/20/2009  . Essential hypertension 01/14/2009   Ihor Austin, [place]; [place]  Aldona Lento 05/11/2017, 12:48 PM  [facility] [address] [place], [place], [address] Phone: [phone]   Fax:  [phone]  Name: [name] MRN: [id number] Date of Birth: 1922/03/12

## 2017-05-12 ENCOUNTER — Ambulatory Visit: Payer: Medicare Other | Admitting: Family Medicine

## 2017-05-13 ENCOUNTER — Ambulatory Visit: Payer: Medicare Other | Admitting: Family Medicine

## 2017-05-13 ENCOUNTER — Encounter: Payer: Self-pay | Admitting: Family Medicine

## 2017-05-13 VITALS — BP 124/70 | HR 62 | Ht 67.0 in | Wt 140.0 lb

## 2017-05-13 DIAGNOSIS — E213 Hyperparathyroidism, unspecified: Secondary | ICD-10-CM

## 2017-05-13 DIAGNOSIS — N183 Chronic kidney disease, stage 3 unspecified: Secondary | ICD-10-CM

## 2017-05-13 DIAGNOSIS — G250 Essential tremor: Secondary | ICD-10-CM | POA: Diagnosis not present

## 2017-05-13 DIAGNOSIS — E559 Vitamin D deficiency, unspecified: Secondary | ICD-10-CM | POA: Diagnosis not present

## 2017-05-13 DIAGNOSIS — G20 Parkinson's disease: Secondary | ICD-10-CM | POA: Diagnosis not present

## 2017-05-13 DIAGNOSIS — E778 Other disorders of glycoprotein metabolism: Secondary | ICD-10-CM

## 2017-05-13 DIAGNOSIS — M722 Plantar fascial fibromatosis: Secondary | ICD-10-CM | POA: Diagnosis not present

## 2017-05-13 DIAGNOSIS — Z79899 Other long term (current) drug therapy: Secondary | ICD-10-CM

## 2017-05-13 NOTE — Progress Notes (Signed)
Subjective:    Patient ID: [name], male    DOB: 10/08/1921, 84 y.o.   MRN: [id number]   Hyperlipidemia   This is a chronic problem. Pertinent negatives include no chest pain or shortness of breath.  Patient does try to adhere to a healthy diet does try to take medication on a regular basis  Follow up bloodwork results.  Recent lab work showed calcium was off vitamin D was off and PTH was off this is more than likely related into his chronic kidney disease this was discussed in detail  Wants to discuss not going back to parkinson's specialist.  Patient does well with the medicine he would like for [place] to prescribe it he states he still has stiffness and some difficulty getting up he does not have trouble swallowing he does have significant tremor  Left foot pain about one week.  He relates pain on the bottom of the foot hurts when he puts pressure on it hurts when he goes to stand up denies any other particular troubles  Oxygen levels at physical therapy have dropped down to the 70's per son.  Oxygen levels at home of been in the 90s and dropped down into the mid 80s with exercise then come back up they have not dropped into the low 80s or into the 70s patient does get short of breath with activity he does have a history of COPD    Review of Systems  Constitutional: Negative for activity change, appetite change and fatigue.  HENT: Negative for congestion and rhinorrhea.   Respiratory: Negative for cough, chest tightness and shortness of breath.   Cardiovascular: Negative for chest pain and leg swelling.  Gastrointestinal: Negative for abdominal pain, diarrhea and nausea.  Endocrine: Negative for polydipsia and polyphagia.  Genitourinary: Negative for dysuria and hematuria.  Neurological: Negative for weakness and headaches.  Psychiatric/Behavioral: Negative for confusion and dysphoric mood.       Objective:   Physical Exam  Constitutional: He appears well-nourished. No  distress.  HENT:  Head: Normocephalic and atraumatic.  Eyes: Right eye exhibits no discharge. Left eye exhibits no discharge.  Neck: No tracheal deviation present.  Cardiovascular: Normal rate, regular rhythm and normal heart sounds.  No murmur heard. Pulmonary/Chest: Effort normal and breath sounds normal. No respiratory distress. He has no wheezes.  Musculoskeletal: He exhibits no edema.  Lymphadenopathy:    He has no cervical adenopathy.  Neurological: He is alert.  Skin: Skin is warm. No rash noted.  Psychiatric: His behavior is normal.  Vitals reviewed. Time spent discussing his hyperparathyroidism and how we will monitor this time spent discussing how to treat plantar fasciitis also discussion with the patient regarding Parkinson's medicine and possibly needing to adjust it plus also discussing his essential tremor and how primidone did not help plus also discussing protein limits with chronic kidney disease and treating his vitamin D deficiency all of this took well more than 25 minutes  25 minutes was spent with the patient.  This statement verifies that 25 minutes was indeed spent with the patient. Greater than half the time was spent in discussion, counseling and answering questions  regarding the issues that the patient came in for today as reflected in the diagnosis (s) please refer to documentation for further details.      Assessment & Plan:  1. Hyperparathyroidism ([place]) More than likely related to his chronic kidney disease and calcium/vitamin D patient taking vitamin D supplementation we will follow-up again with lab  work in 4 months - Ambulatory referral to Endocrinology  2. Plantar fasciitis Stretching exercises were shown if ongoing trouble see podiatry for injections  3. Parkinson's disease ([place]) Is under fair control continue current measures but may need to adjust up on the medication depending on his response  4. Essential tremor He did not respond to the  primidone because of side effects therefore he will hold off on this medication  5. CKD (chronic kidney disease), stage III ([facility]) Importance of dietary restrictions and monitoring discussed plus protein restrictions stable  6. Vitamin D deficiency Vitamin D deficiency supplementation discuss - VITAMIN D 25 Hydroxy (Vit-D Deficiency, Fractures)  7. Hypoproteinemia ([facility]) Protein low nutritional status and assessed in discuss - Protein, total  8. High risk medication use Will need to check metabolic 7 in 4 months - Basic metabolic panel Also COPD with hypoxia Fair control watch her oxygen levels closely  Parkinson's fair control but not under great control monitor closely may need to change medications or adjust upward on the dose

## 2017-05-13 NOTE — Patient Instructions (Signed)

## 2017-05-13 NOTE — Addendum Note (Signed)
Addended by: Karle Barr on: 05/13/2017 09:40 AM   Modules accepted: Orders

## 2017-05-14 ENCOUNTER — Ambulatory Visit: Payer: Medicare Other | Admitting: Family Medicine

## 2017-05-14 ENCOUNTER — Ambulatory Visit (HOSPITAL_COMMUNITY): Payer: Medicare Other | Attending: Internal Medicine | Admitting: Physical Therapy

## 2017-05-14 DIAGNOSIS — M722 Plantar fascial fibromatosis: Secondary | ICD-10-CM

## 2017-05-14 DIAGNOSIS — R296 Repeated falls: Secondary | ICD-10-CM

## 2017-05-14 DIAGNOSIS — R600 Localized edema: Secondary | ICD-10-CM | POA: Diagnosis not present

## 2017-05-14 DIAGNOSIS — M6281 Muscle weakness (generalized): Secondary | ICD-10-CM | POA: Insufficient documentation

## 2017-05-14 DIAGNOSIS — R2681 Unsteadiness on feet: Secondary | ICD-10-CM | POA: Insufficient documentation

## 2017-05-14 NOTE — Progress Notes (Signed)
   Subjective:    Patient ID: [name], male    DOB: [birth year], 84 y.o.   MRN: [id number]  HPI Patient arrives with c/o swollen painful ankles at PT today This patient relates that painful feet this morning difficult time walking swelling in the lower legs worse on the right side than the left side also the oxygen levels at physical therapy were lower than what they would expect one side was 90 the other hand was 65 this worried them and they were asked to come to the office to be checked his weights have been good he was just seen yesterday O2 on rest =95 % O2 after 2 labs(exertion) =95%  Review of Systems He does get short of breath with activity denies any chest pain denies headache sweats chills fevers denies leg pains denies abdominal discomfort vomiting diarrhea no rashes    Objective:   Physical Exam  Vital signs were reviewed.  GEN-no acute distress not toxic HEENT- external ears appear normal her eyes appear normal without discharge head is atraumatic Neck-no lymphadenopathy or masses are felt no tracheal deviation Chest- CTA respiratory rate is normal no crackles no rhonchi Cardiovascular- heart is regular rate is normal no murmurs detected Extremities skin warm dry has some edema In the ankles no tenderness in the calves  neuro-alert and oriented, no unilateral numbness or weakness detected Tremors noted      Assessment & Plan:  Pedal edema stable Probable plantar fascitis exercises shown No sign of no need for ultrasound or lab work Patient will do other lab work as requested in a couple weeks O2 saturation with activity looks good

## 2017-05-14 NOTE — Therapy (Signed)
[place], [place], [address] Phone: [phone]   Fax:  [phone]  Patient Details  Name: [name] MRN: [id number] Date of Birth: Mar 25, 1922 Referring Provider:  Kathyrn Drown, MD  Encounter Date: 05/14/2017   Today patient arrived to physical therapy with his son this session with bilateral ankle swelling and bilateral foot pain 5/10. The plan was to perform a re-assessment this session. Patient was not reporting of any shortness of breath or other symptoms. Patient laid on his side for therapist to measure hip strength and SpO2 was found to be 67% although patient reported no shortness of breath or lightheadedness. Patient was immediately returned to sitting and his SpO2 was found to be at 99%. Therefore deferred laying down. Therapist performed figure-8 measurement of bilateral ankles and it was found that patient's right ankle was 23.5 inches on the right and 22.75 inches on the left. Then patient's SpO2 was taken and found to be 97% on the left and 81% on the right. Therapist deferred continuing re-assessment this session and instead sent patient to physician to assess causes of ankle swelling of a non-muskuloskelatal nature. Told patient and son to contact me to follow-up about physician appointment.   Clarene Critchley PT, DPT 10:44 AM, 05/14/17 [place], [place], [address] Phone: [phone]   Fax:  [phone]

## 2017-05-14 NOTE — Patient Instructions (Signed)

## 2017-05-18 ENCOUNTER — Encounter (HOSPITAL_COMMUNITY): Payer: Self-pay

## 2017-05-18 ENCOUNTER — Other Ambulatory Visit: Payer: Self-pay

## 2017-05-18 ENCOUNTER — Ambulatory Visit (HOSPITAL_COMMUNITY): Payer: Medicare Other

## 2017-05-18 DIAGNOSIS — R2681 Unsteadiness on feet: Secondary | ICD-10-CM | POA: Diagnosis not present

## 2017-05-18 DIAGNOSIS — M6281 Muscle weakness (generalized): Secondary | ICD-10-CM | POA: Diagnosis present

## 2017-05-18 DIAGNOSIS — R296 Repeated falls: Secondary | ICD-10-CM | POA: Diagnosis present

## 2017-05-18 NOTE — Therapy (Signed)
[facility], [place], [address] Phone: [phone]   Fax:  [phone]  Physical Therapy Treatment/Re-Assessment  Patient Details  Name: [name] MRN: [id number] Date of Birth: 1921-04-19 Referring Provider: Raphael Gibney, MD   Encounter Date: 05/18/2017  PT End of Session - 05/18/17 1006    Visit Number  8    Number of Visits  13    Date for PT Re-Evaluation  06/02/17    Authorization Type  [employer] Medicare    Authorization Time Period  04/21/17 to 06/02/17    PT Start Time  0905    PT Stop Time  0946    PT Time Calculation (min)  41 min    Equipment Utilized During Treatment  Gait belt    Activity Tolerance  Patient tolerated treatment well;Other (comment) SpO2 monitored through session, remained above 94%    Behavior During Therapy  [facility] for tasks assessed/performed       Past Medical History:  Diagnosis Date  . ASCVD (arteriosclerotic cardiovascular disease)     CABG in 04/1993; and negative stress nuclear study in 08/2001  . Benign essential tremor   . CAD (coronary artery disease)   . Cancer ([place])    skin  . Cerebrovascular disease    Right carotid bruit-40-69% left internal carotid artery stenosis in 4/06; followed [facility]  . Chronic kidney disease, stage II (mild)    Creatinine-1.6 in 09/2008  . COPD (chronic obstructive pulmonary disease) ([place])   . Degenerative joint disease of knee, left   . Gout   . Horseshoe kidney   . Hyperlipidemia   . Hypertension   . Lung cancer ([place]) 2013  . Normocytic anemia   . Prediabetes   . Pulmonary disease   . Renal insufficiency   . Syncope   . Tobacco abuse, in remission    40 pack years; discontinued in 1980    Past Surgical History:  Procedure Laterality Date  . CARDIAC SURGERY    . COLONOSCOPY W/ POLYPECTOMY  1985  . CORONARY ARTERY BYPASS GRAFT  1995  . LESION EXCISION    . PILONIDAL CYST EXCISION  1948    There were no vitals filed for this  visit.  Subjective Assessment - 05/18/17 1018    Subjective  Pt stated he is feeling good today and reports his Bil Le swelling has gone down. He went to the MD and they will cotinue to monitor per patient/son report.     Patient is accompained by:  Family member son    Patient Stated Goals  get better    Currently in Pain?  No/denies         [facility] PT Assessment - 05/18/17 0001      Assessment   Medical Diagnosis  COPD    Referring Provider  Raphael Gibney, MD    Next MD Visit  F/u with Dr. Wolfgang Phoenix for blood work and PRN    Prior Therapy  yes for balance about 2 years ago      Functional Tests   Functional tests  Single leg stance      Single Leg Stance   Comments  Rt LE: 3 seconds; Lt LE: 6 seconds      Strength   Right Hip Flexion  5/5    Right Hip Extension  2+/5    Right Hip ABduction  4+/5 was 4/5    Left Hip Flexion  5/5    Left Hip  Extension  3-/5 was 2+/5    Left Hip ABduction  4+/5 was 4/5    Right Knee Flexion  4+/5    Right Knee Extension  5/5    Left Knee Flexion  4+/5    Left Knee Extension  5/5    Right Ankle Dorsiflexion  4+/5    Left Ankle Dorsiflexion  4+/5 was 4/5      Ambulation/Gait   Ambulation Distance (Feet)  442 Feet 3MWT    Assistive device  None    Gait Pattern  Step-through pattern;Trendelenburg;Decreased arm swing - right;Decreased arm swing - left;Decreased hip/knee flexion - right;Decreased hip/knee flexion - left;Trunk flexed;Poor foot clearance - left;Poor foot clearance - right Bil hips externally rotated    Ambulation Surface  Level    Gait velocity  0.73 m/s    Gait Comments  patient required stopping rest at 2:55      Static Standing Balance   Static Standing - Comment/# of Minutes  Rt LE: 3 seconds; Lt LE: 6 seconds      Dynamic Gait Index   Level Surface  Mild Impairment    Change in Gait Speed  Mild Impairment    Gait with Horizontal Head Turns  Mild Impairment    Gait with Vertical Head Turns  Mild Impairment    Gait and  Pivot Turn  Mild Impairment    Step Over Obstacle  Normal    Step Around Obstacles  Mild Impairment    Steps  Moderate Impairment    Total Score  16    DGI comment:  16/21 = patient is at risk for falls        [facility] Adult PT Treatment/Exercise - 05/18/17 0001      Knee/Hip Exercises: Standing   Hip Abduction  Both;Knee straight;1 set;10 reps;Limitations    Abduction Limitations  green theraband    Hip Extension  Both;Knee straight;1 set;10 reps;Limitations    Extension Limitations  green theraband    Forward Step Up  Both;1 set;10 reps;Step Height: 4";Hand Hold: 1;Limitations    Forward Step Up Limitations  verbal and tactile cues needed for sequencing       Balance Exercises - 05/18/17 1001      Balance Exercises: Standing   Tandem Stance  Eyes open;Foam/compliant surface;30 secs;4 reps;Intermittent upper extremity support alteranted foot position, 2 reps each way    Marching Limitations  2x 15 steps Bil LE with taps to 4" box        PT Education - 05/18/17 1004    Education provided  Yes    Education Details  Patient and son educated on progress towards goals. Educated on exercise technique throughout. Educated on pursed lip breathing during therapy to maintain O2 sats, educated on sign/symptoms of Acute CHF and to contact MD if notice bil LE swelling with eight gain of 6-8 lbs, increased SOB, decreased activity tolerance.    Person(s) Educated  Patient;Child(ren) son    Methods  Explanation    Comprehension  Verbalized understanding       PT Short Term Goals - 05/18/17 0904      PT SHORT TERM GOAL #1   Title  Pt will be independent with HEP and perform consistently in order to maximize overall strength and decrease risk for falls.     Baseline  05/18/17 - patietn/family reports compliance    Time  3    Period  Weeks    Status  Achieved    Target Date  05/12/17  PT SHORT TERM GOAL #2   Title  Pt will have 1/2 grade improvement in MMT of all muscle groups tested  in order to maximize gait, balance, and decrease risk for falls.    Baseline  05/18/17 - improvement for several muscle groups by 1/2 grade but not all    Time  3    Period  Weeks    Status  Partially Met      PT SHORT TERM GOAL #3   Title  Pt will be able to perform bil SLS for 10 sec or > with no UE support in order to maximize gait over uneven ground.    Baseline  05/18/17 - Rt LE: 0-3 seconds, Lt LE: 0-6 seconds    Status  On-going      PT SHORT TERM GOAL #4   Title  --    Status  --        PT Long Term Goals - 05/18/17 0905      PT LONG TERM GOAL #1   Title  Pt will have at least 1 grade improvement in MMT of all muscle groups tested to maximzie transfers, gait, and allow him to complete ADLs with greater ease.     Baseline  05/18/17 - improvement for several muscle groups by 1/2 grade but not all    Time  6    Period  Weeks    Status  On-going    Target Date  06/02/17      PT LONG TERM GOAL #2   Title  Pt will be able to ambulate at least 642f during the 3MWT with LRAD and without requiring any rest breaks to demo improved strength and endurance to improve his community access.     Baseline  05/18/17 - 442 feet during 3MWT with no device    Time  6    Period  Weeks    Status  On-going      PT LONG TERM GOAL #3   Title  Pt will score 20/24 or > on the DGI to demo improved dynamic balance and decrease his risk for falls.     Baseline  05/18/17 - 16/21 = remains a fall risk    Time  6    Period  Weeks    Status  On-going      PT LONG TERM GOAL #4   Title  Pt report going to the local fitness center at least 3 days/week and performing his regular exercise routine to demo improved overall function.    Time  6    Period  Weeks    Status  On-going      PT LONG TERM GOAL #5   Title  --    Status  --            Plan - 05/18/17 1007    Clinical Impression Statement  Re-assessment performed today and patient has met/partially met 2/3 short term goals and is progressing  towards remaining goals. His greatest limitations currently remain decreased balance in single limb stance activities, and proximal hip weakness. His SpO2 remained at or above 94% and patient/family has been educated on signs and symptoms of acute CHF as patient has had Bil LE swelling and has kidney disease. Mr. BKarczewskihas improved his functional gait speed from 0.63 m/s to 0.73 m/s and is progressing towards safe community ambulatory velocity of 0.8 m/s. He will continue to benefit from skilled PT services to progress towards remaining goals and decrease  fall risk.    Rehab Potential  Fair    PT Frequency  2x / week    PT Duration  6 weeks    PT Treatment/Interventions  ADLs/Self Care Home Management;Cryotherapy;Electrical Stimulation;Moist Heat;DME Instruction;Gait training;Stair training;Functional mobility training;Therapeutic activities;Therapeutic exercise;Balance training;Neuromuscular re-education;Patient/family education;Manual techniques;Passive range of motion;Dry needling;Taping;Energy conservation    PT Next Visit Plan  discontinue exercises that repeatedly decrease oxygen saturation. Progress with lower extremity strengthening as tolerated while continuing to monitor O2 saturation. Continue step ups and focus on hip flexor/extensor/abductor strenghtening. Perform gait trainer for reciprocol step pattern.    PT Home Exercise Plan  eval: seated heel and toe raises, sit to stands; 05/07/2017 - up/down stairs with railing using rediprocal pattern    Consulted and Agree with Plan of Care  Family member/caregiver;Patient    Family Member Consulted  Son       Patient will benefit from skilled therapeutic intervention in order to improve the following deficits and impairments:  Abnormal gait, Cardiopulmonary status limiting activity, Decreased activity tolerance, Decreased balance, Decreased coordination, Decreased endurance, Decreased knowledge of use of DME, Decreased strength, Difficulty walking,  Hypomobility, Increased muscle spasms, Impaired perceived functional ability, Impaired flexibility, Improper body mechanics, Postural dysfunction  Visit Diagnosis: Unsteadiness on feet  Muscle weakness (generalized)  Repeated falls     Problem List Patient Active Problem List   Diagnosis Date Noted  . Pedal edema 05/14/2017  . Parkinson's disease ([place]) 10/05/2016  . Diastolic CHF, chronic ([place]) 05/10/2015  . Dizziness   . Coronary artery disease involving coronary bypass graft of native heart without angina pectoris   . Near syncope 04/02/2015  . Diarrhea 04/02/2015  . COPD exacerbation ([place]) 06/04/2013  . SOB (shortness of breath) 06/04/2013  . Acute respiratory failure with hypoxia ([place]) 06/04/2013  . Hemoptysis 08/10/2012  . Prediabetes 08/28/2011  . Lung cancer ([place]) 08/25/2011  . COPD (chronic obstructive pulmonary disease) ([place]) 08/25/2011  . CKD (chronic kidney disease), stage III ([place]) 12/07/2010  . Chest pain 08/18/2010  . Syncope   . Tobacco abuse, in remission   . Degenerative joint disease of knee, left   . COLONIC POLYPS 05/20/2009  . Hyperlipidemia 05/20/2009  . ANEMIA 05/20/2009  . Essential tremor 05/20/2009  . ATHEROSCLEROTIC CARDIOVASCULAR DISEASE 05/20/2009  . CEREBROVASCULAR DISEASE 05/20/2009  . Essential hypertension 01/14/2009    Kipp Brood, PT, DPT Physical Therapist with [facility]  05/18/2017 10:19 AM    [facility] [address] [place], [place], [address] Phone: [phone]   Fax:  [phone]  Name: [name] MRN: [id number] Date of Birth: 29-May-1921

## 2017-05-20 ENCOUNTER — Ambulatory Visit (HOSPITAL_COMMUNITY): Payer: Medicare Other

## 2017-05-20 ENCOUNTER — Encounter (HOSPITAL_COMMUNITY): Payer: Self-pay

## 2017-05-20 DIAGNOSIS — R296 Repeated falls: Secondary | ICD-10-CM

## 2017-05-20 DIAGNOSIS — R2681 Unsteadiness on feet: Secondary | ICD-10-CM

## 2017-05-20 DIAGNOSIS — M6281 Muscle weakness (generalized): Secondary | ICD-10-CM

## 2017-05-20 NOTE — Therapy (Signed)
[place], [place], [address] Phone: [phone]   Fax:  [phone]  Physical Therapy Treatment  Patient Details  Name: [name] MRN: [id number] Date of Birth: 04-Sep-1921 Referring Provider: Raphael Gibney, MD   Encounter Date: 05/20/2017  PT End of Session - 05/20/17 0900    Visit Number  9    Number of Visits  13    Date for PT Re-Evaluation  06/02/17    Authorization Type  [employer] Medicare    Authorization Time Period  04/21/17 to 06/02/17    PT Start Time  0900    PT Stop Time  0946    PT Time Calculation (min)  46 min    Equipment Utilized During Treatment  Gait belt    Activity Tolerance  Patient tolerated treatment well;Other (comment) SpO2 monitored through session, remained above 94%    Behavior During Therapy  [facility] for tasks assessed/performed       Past Medical History:  Diagnosis Date  . ASCVD (arteriosclerotic cardiovascular disease)     CABG in 04/1993; and negative stress nuclear study in 08/2001  . Benign essential tremor   . CAD (coronary artery disease)   . Cancer ([place])    skin  . Cerebrovascular disease    Right carotid bruit-40-69% left internal carotid artery stenosis in 4/06; followed [facility]  . Chronic kidney disease, stage II (mild)    Creatinine-1.6 in 09/2008  . COPD (chronic obstructive pulmonary disease) ([place])   . Degenerative joint disease of knee, left   . Gout   . Horseshoe kidney   . Hyperlipidemia   . Hypertension   . Lung cancer ([place]) 2013  . Normocytic anemia   . Prediabetes   . Pulmonary disease   . Renal insufficiency   . Syncope   . Tobacco abuse, in remission    40 pack years; discontinued in 1980    Past Surgical History:  Procedure Laterality Date  . CARDIAC SURGERY    . COLONOSCOPY W/ POLYPECTOMY  1985  . CORONARY ARTERY BYPASS GRAFT  1995  . LESION EXCISION    . PILONIDAL CYST EXCISION  1948    There were no vitals filed for this visit.  Subjective  Assessment - 05/20/17 0900    Subjective  Pt states that his legs are swollen again. They saw Dr. Wolfgang Phoenix about this on 2/1 who wasn't too concerned about it. He stated to just continue to monitor it and contact him if it gets worse.     Patient is accompained by:  Family member son    Patient Stated Goals  get better    Currently in Pain?  No/denies          [facility] Adult PT Treatment/Exercise - 05/20/17 0001      Knee/Hip Exercises: Stretches   Passive Hamstring Stretch  Both;2 reps;30 seconds    Passive Hamstring Stretch Limitations  seated      Knee/Hip Exercises: Standing   Heel Raises  Both;15 reps    Heel Raises Limitations  heel and toe    Hip Abduction  Both;2 sets;15 reps    Abduction Limitations  green theraband    Hip Extension  Both;2 sets;15 reps    Extension Limitations  green theraband    Gait Training  238f x1RT focusin on arm swing utilizing dowels    Other Standing Knee Exercises  retro ambulation 135fx 3RT; 1588f2RT + tossing 2 scarves  Knee/Hip Exercises: Seated   Other Seated Knee/Hip Exercises  sit to stand + tossing blue scarf 2x5 reps    Sit to Sand  15 reps;without UE support front loaded with yellow ball           PT Education - 05/20/17 0900    Education provided  Yes    Education Details  exercise technique, updated HEP    Person(s) Educated  Patient;Child(ren)    Methods  Explanation;Demonstration    Comprehension  Verbalized understanding;Returned demonstration       PT Short Term Goals - 05/18/17 0904      PT SHORT TERM GOAL #1   Title  Pt will be independent with HEP and perform consistently in order to maximize overall strength and decrease risk for falls.     Baseline  05/18/17 - patietn/family reports compliance    Time  3    Period  Weeks    Status  Achieved    Target Date  05/12/17      PT SHORT TERM GOAL #2   Title  Pt will have 1/2 grade improvement in MMT of all muscle groups tested in order to maximize gait, balance,  and decrease risk for falls.    Baseline  05/18/17 - improvement for several muscle groups by 1/2 grade but not all    Time  3    Period  Weeks    Status  Partially Met      PT SHORT TERM GOAL #3   Title  Pt will be able to perform bil SLS for 10 sec or > with no UE support in order to maximize gait over uneven ground.    Baseline  05/18/17 - Rt LE: 0-3 seconds, Lt LE: 0-6 seconds    Status  On-going      PT SHORT TERM GOAL #4   Title  --    Status  --        PT Long Term Goals - 05/18/17 0905      PT LONG TERM GOAL #1   Title  Pt will have at least 1 grade improvement in MMT of all muscle groups tested to maximzie transfers, gait, and allow him to complete ADLs with greater ease.     Baseline  05/18/17 - improvement for several muscle groups by 1/2 grade but not all    Time  6    Period  Weeks    Status  On-going    Target Date  06/02/17      PT LONG TERM GOAL #2   Title  Pt will be able to ambulate at least 653f during the 3MWT with LRAD and without requiring any rest breaks to demo improved strength and endurance to improve his community access.     Baseline  05/18/17 - 442 feet during 3MWT with no device    Time  6    Period  Weeks    Status  On-going      PT LONG TERM GOAL #3   Title  Pt will score 20/24 or > on the DGI to demo improved dynamic balance and decrease his risk for falls.     Baseline  05/18/17 - 16/21 = remains a fall risk    Time  6    Period  Weeks    Status  On-going      PT LONG TERM GOAL #4   Title  Pt report going to the local fitness center at least 3 days/week and performing  his regular exercise routine to demo improved overall function.    Time  6    Period  Weeks    Status  On-going      PT LONG TERM GOAL #5   Title  --    Status  --            Plan - 05/20/17 0950    Clinical Impression Statement  Pt presented with increased LE swelling again this date; per son, Dr. Wolfgang Phoenix didn't seem to be too concerned with it and didn't reveal any  major cardiovascular findings. He told them on 2/1 to continue to monitor it and to return if it worsens. Continued with established POC focusing on hip strength, functional strength, and balance. Added scarves to sit to stands and with ambulation; pt demo'ing increased difficulty maintaining ambulation and min difficulty sequencing but did have good reaction times. Pt's O2 remained approximately 96% and above throughout session except for a 1x occurrence when it dropped to 79% following ambulation with the scarf tossing; however, he quickly improved to 97% with <1 min seated rest break. Updated pt's HEP this date. Continue as planned.    Rehab Potential  Fair    PT Frequency  2x / week    PT Duration  6 weeks    PT Treatment/Interventions  ADLs/Self Care Home Management;Cryotherapy;Electrical Stimulation;Moist Heat;DME Instruction;Gait training;Stair training;Functional mobility training;Therapeutic activities;Therapeutic exercise;Balance training;Neuromuscular re-education;Patient/family education;Manual techniques;Passive range of motion;Dry needling;Taping;Energy conservation    PT Next Visit Plan  discontinue exercises that repeatedly decrease oxygen saturation. Progress with lower extremity strengthening as tolerated while continuing to monitor O2 saturation. Continue step ups and focus on hip flexor/extensor/abductor strenghtening. Perform gait trainer for reciprocol step pattern.    PT Home Exercise Plan  eval: sit to stands; 05/07/2017 - up/down stairs with railing using rediprocal pattern; 2/7: standing heel to toe, sit to stands (son might add scarf if he can find one), standin abd and ext with GTB    Consulted and Agree with Plan of Care  Family member/caregiver;Patient    Family Member Consulted  Son       Patient will benefit from skilled therapeutic intervention in order to improve the following deficits and impairments:  Abnormal gait, Cardiopulmonary status limiting activity, Decreased  activity tolerance, Decreased balance, Decreased coordination, Decreased endurance, Decreased knowledge of use of DME, Decreased strength, Difficulty walking, Hypomobility, Increased muscle spasms, Impaired perceived functional ability, Impaired flexibility, Improper body mechanics, Postural dysfunction  Visit Diagnosis: Unsteadiness on feet  Muscle weakness (generalized)  Repeated falls     Problem List Patient Active Problem List   Diagnosis Date Noted  . Pedal edema 05/14/2017  . Parkinson's disease ([place]) 10/05/2016  . Diastolic CHF, chronic ([place]) 05/10/2015  . Dizziness   . Coronary artery disease involving coronary bypass graft of native heart without angina pectoris   . Near syncope 04/02/2015  . Diarrhea 04/02/2015  . COPD exacerbation ([place]) 06/04/2013  . SOB (shortness of breath) 06/04/2013  . Acute respiratory failure with hypoxia ([place]) 06/04/2013  . Hemoptysis 08/10/2012  . Prediabetes 08/28/2011  . Lung cancer ([place]) 08/25/2011  . COPD (chronic obstructive pulmonary disease) ([place]) 08/25/2011  . CKD (chronic kidney disease), stage III ([place]) 12/07/2010  . Chest pain 08/18/2010  . Syncope   . Tobacco abuse, in remission   . Degenerative joint disease of knee, left   . COLONIC POLYPS 05/20/2009  . Hyperlipidemia 05/20/2009  . ANEMIA 05/20/2009  . Essential tremor 05/20/2009  . ATHEROSCLEROTIC CARDIOVASCULAR DISEASE  05/20/2009  . CEREBROVASCULAR DISEASE 05/20/2009  . Essential hypertension 01/14/2009       Geraldine Solar PT, DPT  [facility] [address] [place], [place], [address] Phone: [phone]   Fax:  [phone]  Name: DICKIE CLOE MRN: [id number] Date of Birth: 26-Aug-1921

## 2017-05-20 NOTE — Patient Instructions (Signed)
  LOOPED ELASTIC BAND HIP ABDUCTION  While standing with an elastic band looped around your ankles, move the target leg out to the side as shown.    LOOPED ELASTIC BAND HIP EXTENSION  While standing with an elastic band looped around your ankles, move the target leg back as shown.   Keep your knees straight the entire time.    SIT TO STAND - NO SUPPORT  Start by scooting close to the front of the chair.  Next, lean forward at your trunk and reach forward with your arms and rise to standing without using your hands to push off from the chair or other object.   Use your arms as a counter-balance by reaching forward when in sitting and lower them as you approach standing.    STANDING HEEL RAISES  While standing, raise up on your toes as you lift your heels off the ground.   TOES RAISES - DORSIFLEXION STANDING  In a standing position with your feet on the ground, raise up your forefoot and toes as you bend at your ankle.      Perform 1x/day, 2-3 sets of 10-15 reps of each

## 2017-05-24 ENCOUNTER — Encounter (HOSPITAL_COMMUNITY): Payer: Self-pay

## 2017-05-24 ENCOUNTER — Ambulatory Visit (HOSPITAL_COMMUNITY): Payer: Medicare Other

## 2017-05-24 DIAGNOSIS — R2681 Unsteadiness on feet: Secondary | ICD-10-CM | POA: Diagnosis not present

## 2017-05-24 DIAGNOSIS — R296 Repeated falls: Secondary | ICD-10-CM

## 2017-05-24 DIAGNOSIS — M6281 Muscle weakness (generalized): Secondary | ICD-10-CM

## 2017-05-24 NOTE — Therapy (Signed)
[facility] [place], [place], [address] Phone: [phone]   Fax:  [phone]  Physical Therapy Treatment  Patient Details  Name: [name] MRN: [id number] Date of Birth: 09-08-1921 Referring Provider: Raphael Gibney, MD   Encounter Date: 05/24/2017  PT End of Session - 05/24/17 0902    Visit Number  10    Number of Visits  13    Date for PT Re-Evaluation  06/02/17    Authorization Type  [employer] Medicare    Authorization Time Period  04/21/17 to 06/02/17    PT Start Time  0900    PT Stop Time  0943    PT Time Calculation (min)  43 min    Equipment Utilized During Treatment  Gait belt    Activity Tolerance  Patient tolerated treatment well;Other (comment) SpO2 monitored through session, remained above 94%    Behavior During Therapy  [facility] for tasks assessed/performed       Past Medical History:  Diagnosis Date  . ASCVD (arteriosclerotic cardiovascular disease)     CABG in 04/1993; and negative stress nuclear study in 08/2001  . Benign essential tremor   . CAD (coronary artery disease)   . Cancer ([place])    skin  . Cerebrovascular disease    Right carotid bruit-40-69% left internal carotid artery stenosis in 4/06; followed [facility]  . Chronic kidney disease, stage II (mild)    Creatinine-1.6 in 09/2008  . COPD (chronic obstructive pulmonary disease) ([place])   . Degenerative joint disease of knee, left   . Gout   . Horseshoe kidney   . Hyperlipidemia   . Hypertension   . Lung cancer ([place]) 2013  . Normocytic anemia   . Prediabetes   . Pulmonary disease   . Renal insufficiency   . Syncope   . Tobacco abuse, in remission    40 pack years; discontinued in 1980    Past Surgical History:  Procedure Laterality Date  . CARDIAC SURGERY    . COLONOSCOPY W/ POLYPECTOMY  1985  . CORONARY ARTERY BYPASS GRAFT  1995  . LESION EXCISION    . PILONIDAL CYST EXCISION  1948    There were no vitals filed for this visit.  Subjective  Assessment - 05/24/17 0902    Subjective  Pt states he has a growth on his foot that was bleeding this morning. He is in aout 7-8/10 pain in that toe. His podiatrist if aware and following.    Patient is accompained by:  Family member son    Patient Stated Goals  get better    Currently in Pain?  Yes    Pain Score  8     Pain Location  Foot    Pain Orientation  Right           [facility] Adult PT Treatment/Exercise - 05/24/17 0001      Knee/Hip Exercises: Standing   Forward Step Up  Both;2 sets;10 reps;Hand Hold: 0;Step Height: 4"    Forward Step Up Limitations  min cues for increased step length to clear step on step down    SLS  marching on foam 2x10 with 2-3" holds    Other Standing Knee Exercises  --       [facility]WR [facility]) - 05/24/17 5732    PWR! Up  2x10 with volleyball to 18" step    PWR! Rock  2x10 [employer] for [employer]    PWR! Twist  2x10  PWR Step  x10 over 6" hurdles reaching for cones            PT Education - 05/24/17 1210    Education provided  Yes    Education Details  exercise technique, contact his podiatrist regarding spot on foot    Person(s) Educated  Patient;Child(ren)    Methods  Explanation;Demonstration    Comprehension  Verbalized understanding;Returned demonstration       PT Short Term Goals - 05/18/17 0904      PT SHORT TERM GOAL #1   Title  Pt will be independent with HEP and perform consistently in order to maximize overall strength and decrease risk for falls.     Baseline  05/18/17 - patietn/family reports compliance    Time  3    Period  Weeks    Status  Achieved    Target Date  05/12/17      PT SHORT TERM GOAL #2   Title  Pt will have 1/2 grade improvement in MMT of all muscle groups tested in order to maximize gait, balance, and decrease risk for falls.    Baseline  05/18/17 - improvement for several muscle groups by 1/2 grade but not all    Time  3    Period  Weeks    Status  Partially Met      PT SHORT TERM GOAL #3   Title   Pt will be able to perform bil SLS for 10 sec or > with no UE support in order to maximize gait over uneven ground.    Baseline  05/18/17 - Rt LE: 0-3 seconds, Lt LE: 0-6 seconds    Status  On-going      PT SHORT TERM GOAL #4   Title  --    Status  --        PT Long Term Goals - 05/18/17 0905      PT LONG TERM GOAL #1   Title  Pt will have at least 1 grade improvement in MMT of all muscle groups tested to maximzie transfers, gait, and allow him to complete ADLs with greater ease.     Baseline  05/18/17 - improvement for several muscle groups by 1/2 grade but not all    Time  6    Period  Weeks    Status  On-going    Target Date  06/02/17      PT LONG TERM GOAL #2   Title  Pt will be able to ambulate at least 691f during the 3MWT with LRAD and without requiring any rest breaks to demo improved strength and endurance to improve his community access.     Baseline  05/18/17 - 442 feet during 3MWT with no device    Time  6    Period  Weeks    Status  On-going      PT LONG TERM GOAL #3   Title  Pt will score 20/24 or > on the DGI to demo improved dynamic balance and decrease his risk for falls.     Baseline  05/18/17 - 16/21 = remains a fall risk    Time  6    Period  Weeks    Status  On-going      PT LONG TERM GOAL #4   Title  Pt report going to the local fitness center at least 3 days/week and performing his regular exercise routine to demo improved overall function.    Time  6    Period  Weeks    Status  On-going      PT LONG TERM GOAL #5   Title  --    Status  --            Plan - 05/24/17 1210    Clinical Impression Statement  Pt presenting to therapy stating that he had an open sore on his L toe and that he noticed it bleeding this morning. Pt's son unaware that it was bleeding this morning but did report to the therapist that pt's podiatrist is aware and following pt for this sore. Pt's son asked that a band aid be placed on it and he said that he would call the  podiatrist tomorrow. Pt verbalized he was able and wanted to participate with PT. Introduced pt to [employer]! This date with up, rock and step moves. Pt challenged with rock moves in standing. Pt also challenged with balance activities this date. His O2 remained from 88% or > during session. Continue as planned, progressing as able.    Rehab Potential  Fair    PT Frequency  2x / week    PT Duration  6 weeks    PT Treatment/Interventions  ADLs/Self Care Home Management;Cryotherapy;Electrical Stimulation;Moist Heat;DME Instruction;Gait training;Stair training;Functional mobility training;Therapeutic activities;Therapeutic exercise;Balance training;Neuromuscular re-education;Patient/family education;Manual techniques;Passive range of motion;Dry needling;Taping;Energy conservation    PT Next Visit Plan  discontinue exercises that repeatedly decrease oxygen saturation. Progress with lower extremity strengthening as tolerated while continuing to monitor O2 saturation. Continue step ups and focus on hip flexor/extensor/abductor strenghtening. Perform gait trainer for reciprocol step pattern. contine PWR moves    PT Home Exercise Plan  eval: sit to stands; 05/07/2017 - up/down stairs with railing using rediprocal pattern; 2/7: standing heel to toe, sit to stands (son might add scarf if he can find one), standin abd and ext with GTB    Consulted and Agree with Plan of Care  Family member/caregiver;Patient    Family Member Consulted  Son       Patient will benefit from skilled therapeutic intervention in order to improve the following deficits and impairments:  Abnormal gait, Cardiopulmonary status limiting activity, Decreased activity tolerance, Decreased balance, Decreased coordination, Decreased endurance, Decreased knowledge of use of DME, Decreased strength, Difficulty walking, Hypomobility, Increased muscle spasms, Impaired perceived functional ability, Impaired flexibility, Improper body mechanics, Postural  dysfunction  Visit Diagnosis: Unsteadiness on feet  Muscle weakness (generalized)  Repeated falls     Problem List Patient Active Problem List   Diagnosis Date Noted  . Pedal edema 05/14/2017  . Parkinson's disease ([place]) 10/05/2016  . Diastolic CHF, chronic ([place]) 05/10/2015  . Dizziness   . Coronary artery disease involving coronary bypass graft of native heart without angina pectoris   . Near syncope 04/02/2015  . Diarrhea 04/02/2015  . COPD exacerbation ([place]) 06/04/2013  . SOB (shortness of breath) 06/04/2013  . Acute respiratory failure with hypoxia ([place]) 06/04/2013  . Hemoptysis 08/10/2012  . Prediabetes 08/28/2011  . Lung cancer ([place]) 08/25/2011  . COPD (chronic obstructive pulmonary disease) ([place]) 08/25/2011  . CKD (chronic kidney disease), stage III ([place]) 12/07/2010  . Chest pain 08/18/2010  . Syncope   . Tobacco abuse, in remission   . Degenerative joint disease of knee, left   . COLONIC POLYPS 05/20/2009  . Hyperlipidemia 05/20/2009  . ANEMIA 05/20/2009  . Essential tremor 05/20/2009  . ATHEROSCLEROTIC CARDIOVASCULAR DISEASE 05/20/2009  . CEREBROVASCULAR DISEASE 05/20/2009  . Essential hypertension 01/14/2009      Geraldine Solar PT, DPT  [facility] [place], [place], [address] Phone: [phone]   Fax:  [phone]  Name: PENNY FRISBIE MRN: [id number] Date of Birth: [birth year]

## 2017-05-26 ENCOUNTER — Encounter (HOSPITAL_COMMUNITY): Payer: Self-pay

## 2017-05-26 ENCOUNTER — Ambulatory Visit (HOSPITAL_COMMUNITY): Payer: Medicare Other

## 2017-05-26 DIAGNOSIS — R296 Repeated falls: Secondary | ICD-10-CM

## 2017-05-26 DIAGNOSIS — R2681 Unsteadiness on feet: Secondary | ICD-10-CM | POA: Diagnosis not present

## 2017-05-26 DIAGNOSIS — M6281 Muscle weakness (generalized): Secondary | ICD-10-CM

## 2017-05-26 NOTE — Therapy (Signed)
[place], [place], [address] Phone: [phone]   Fax:  [phone]  Physical Therapy Treatment  Patient Details  Name: [name] MRN: [id number] Date of Birth: [birth year] Referring Provider: Raphael Gibney, MD   Encounter Date: 05/26/2017  PT End of Session - 05/26/17 0900    Visit Number  11    Number of Visits  13    Date for PT Re-Evaluation  06/02/17    Authorization Type  [employer] Medicare    Authorization Time Period  04/21/17 to 06/02/17    PT Start Time  0900    PT Stop Time  0942    PT Time Calculation (min)  42 min    Equipment Utilized During Treatment  Gait belt    Activity Tolerance  Patient tolerated treatment well;Other (comment) SpO2 monitored through session, remained above 94%    Behavior During Therapy  [facility] for tasks assessed/performed       Past Medical History:  Diagnosis Date  . ASCVD (arteriosclerotic cardiovascular disease)     CABG in 04/1993; and negative stress nuclear study in 08/2001  . Benign essential tremor   . CAD (coronary artery disease)   . Cancer ([place])    skin  . Cerebrovascular disease    Right carotid bruit-40-69% left internal carotid artery stenosis in 4/06; followed [facility]  . Chronic kidney disease, stage II (mild)    Creatinine-1.6 in 09/2008  . COPD (chronic obstructive pulmonary disease) ([place])   . Degenerative joint disease of knee, left   . Gout   . Horseshoe kidney   . Hyperlipidemia   . Hypertension   . Lung cancer ([place]) 2013  . Normocytic anemia   . Prediabetes   . Pulmonary disease   . Renal insufficiency   . Syncope   . Tobacco abuse, in remission    40 pack years; discontinued in 1980    Past Surgical History:  Procedure Laterality Date  . CARDIAC SURGERY    . COLONOSCOPY W/ POLYPECTOMY  1985  . CORONARY ARTERY BYPASS GRAFT  1995  . LESION EXCISION    . PILONIDAL CYST EXCISION  1948    There were no vitals filed for this visit.  Subjective  Assessment - 05/26/17 0900    Subjective  Pt states that he felt good following last session. The pt did not want to call his podiatrist so they did not call them regarding his foot.     Patient is accompained by:  Family member son    Patient Stated Goals  get better    Currently in Pain?  Yes    Pain Score  2     Pain Location  Foot    Pain Orientation  Right           [facility] Adult PT Treatment/Exercise - 05/26/17 0001      Knee/Hip Exercises: Standing   Heel Raises  Both;15 reps    Heel Raises Limitations  heel and toe    Forward Step Up  Both;10 reps;Step Height: 6"    Forward Step Up Limitations  min cues for increased step length to clear step on step down    Gait Training  fwd/retro gait 31f x2RT + tossing scarves    Other Standing Knee Exercises  sidestepping with GTB 18fx2RT      Knee/Hip Exercises: Seated   Sit to Sand  2 sets;10 reps;without UE support + tossing blue scarf  [facility]) - 05/26/17 2951    PWR! Rock  2x10    PWR Step  x10 over 6" hurdles reaching for cones      Balance Exercises - 05/26/17 0934      Balance Exercises: Standing   Tandem Stance  Eyes open;Foam/compliant surface x10 reps each + OH lifts with 2# weight bar    Cone Rotation Limitations  cone taps on foam x5RT each            PT Short Term Goals - 05/18/17 0904      PT SHORT TERM GOAL #1   Title  Pt will be independent with HEP and perform consistently in order to maximize overall strength and decrease risk for falls.     Baseline  05/18/17 - patietn/family reports compliance    Time  3    Period  Weeks    Status  Achieved    Target Date  05/12/17      PT SHORT TERM GOAL #2   Title  Pt will have 1/2 grade improvement in MMT of all muscle groups tested in order to maximize gait, balance, and decrease risk for falls.    Baseline  05/18/17 - improvement for several muscle groups by 1/2 grade but not all    Time  3    Period  Weeks    Status  Partially Met      PT SHORT  TERM GOAL #3   Title  Pt will be able to perform bil SLS for 10 sec or > with no UE support in order to maximize gait over uneven ground.    Baseline  05/18/17 - Rt LE: 0-3 seconds, Lt LE: 0-6 seconds    Status  On-going      PT SHORT TERM GOAL #4   Title  --    Status  --        PT Long Term Goals - 05/18/17 0905      PT LONG TERM GOAL #1   Title  Pt will have at least 1 grade improvement in MMT of all muscle groups tested to maximzie transfers, gait, and allow him to complete ADLs with greater ease.     Baseline  05/18/17 - improvement for several muscle groups by 1/2 grade but not all    Time  6    Period  Weeks    Status  On-going    Target Date  06/02/17      PT LONG TERM GOAL #2   Title  Pt will be able to ambulate at least 651f during the 3MWT with LRAD and without requiring any rest breaks to demo improved strength and endurance to improve his community access.     Baseline  05/18/17 - 442 feet during 3MWT with no device    Time  6    Period  Weeks    Status  On-going      PT LONG TERM GOAL #3   Title  Pt will score 20/24 or > on the DGI to demo improved dynamic balance and decrease his risk for falls.     Baseline  05/18/17 - 16/21 = remains a fall risk    Time  6    Period  Weeks    Status  On-going      PT LONG TERM GOAL #4   Title  Pt report going to the local fitness center at least 3 days/week and performing his regular exercise routine to demo improved overall function.  Time  6    Period  Weeks    Status  On-going      PT LONG TERM GOAL #5   Title  --    Status  --            Plan - 05/26/17 0942    Clinical Impression Statement  Continued with established therex focusing on gluteal and functional strengthening as well as balance and coordination. Continued with PWR! Moves this date and pt continues to be challenged. O2 levels remained 88% and > throughout session with only 1 reading of 81% at very EOS but asymptomatic. Continue as planned, progressing  as tolerated.     Rehab Potential  Fair    PT Frequency  2x / week    PT Duration  6 weeks    PT Treatment/Interventions  ADLs/Self Care Home Management;Cryotherapy;Electrical Stimulation;Moist Heat;DME Instruction;Gait training;Stair training;Functional mobility training;Therapeutic activities;Therapeutic exercise;Balance training;Neuromuscular re-education;Patient/family education;Manual techniques;Passive range of motion;Dry needling;Taping;Energy conservation    PT Next Visit Plan  discontinue exercises that repeatedly decrease oxygen saturation. Progress with lower extremity strengthening as tolerated while continuing to monitor O2 saturation. Continue step ups and focus on hip flexor/extensor/abductor strenghtening. Perform gait trainer for reciprocol step pattern. contine PWR moves; continue cone taps on foam    PT Home Exercise Plan  eval: sit to stands; 05/07/2017 - up/down stairs with railing using rediprocal pattern; 2/7: standing heel to toe, sit to stands (son might add scarf if he can find one), standin abd and ext with GTB    Consulted and Agree with Plan of Care  Family member/caregiver;Patient    Family Member Consulted  Son       Patient will benefit from skilled therapeutic intervention in order to improve the following deficits and impairments:  Abnormal gait, Cardiopulmonary status limiting activity, Decreased activity tolerance, Decreased balance, Decreased coordination, Decreased endurance, Decreased knowledge of use of DME, Decreased strength, Difficulty walking, Hypomobility, Increased muscle spasms, Impaired perceived functional ability, Impaired flexibility, Improper body mechanics, Postural dysfunction  Visit Diagnosis: Unsteadiness on feet  Muscle weakness (generalized)  Repeated falls     Problem List Patient Active Problem List   Diagnosis Date Noted  . Pedal edema 05/14/2017  . Parkinson's disease ([place]) 10/05/2016  . Diastolic CHF, chronic ([place]) 05/10/2015   . Dizziness   . Coronary artery disease involving coronary bypass graft of native heart without angina pectoris   . Near syncope 04/02/2015  . Diarrhea 04/02/2015  . COPD exacerbation ([place]) 06/04/2013  . SOB (shortness of breath) 06/04/2013  . Acute respiratory failure with hypoxia ([place]) 06/04/2013  . Hemoptysis 08/10/2012  . Prediabetes 08/28/2011  . Lung cancer ([place]) 08/25/2011  . COPD (chronic obstructive pulmonary disease) ([place]) 08/25/2011  . CKD (chronic kidney disease), stage III ([place]) 12/07/2010  . Chest pain 08/18/2010  . Syncope   . Tobacco abuse, in remission   . Degenerative joint disease of knee, left   . COLONIC POLYPS 05/20/2009  . Hyperlipidemia 05/20/2009  . ANEMIA 05/20/2009  . Essential tremor 05/20/2009  . ATHEROSCLEROTIC CARDIOVASCULAR DISEASE 05/20/2009  . CEREBROVASCULAR DISEASE 05/20/2009  . Essential hypertension 01/14/2009       Geraldine Solar PT, DPT  [facility] [address] [place], [place], [address] Phone: [phone]   Fax:  [phone]  Name: [name] MRN: [id number] Date of Birth: 1922/03/15

## 2017-05-31 ENCOUNTER — Encounter (HOSPITAL_COMMUNITY): Payer: Self-pay

## 2017-05-31 ENCOUNTER — Ambulatory Visit (HOSPITAL_COMMUNITY): Payer: Medicare Other

## 2017-05-31 DIAGNOSIS — R2681 Unsteadiness on feet: Secondary | ICD-10-CM

## 2017-05-31 DIAGNOSIS — R296 Repeated falls: Secondary | ICD-10-CM

## 2017-05-31 DIAGNOSIS — M6281 Muscle weakness (generalized): Secondary | ICD-10-CM

## 2017-05-31 NOTE — Therapy (Signed)
[place], [place], [address] Phone: [phone]   Fax:  [phone]  Physical Therapy Treatment  Patient Details  Name: [name] MRN: [id number] Date of Birth: November 11, 1921 Referring Provider: Raphael Gibney, MD   Encounter Date: 05/31/2017  PT End of Session - 05/31/17 0946    Visit Number  12    Number of Visits  13    Date for PT Re-Evaluation  06/02/17    Authorization Type  [employer] Medicare    Authorization Time Period  04/21/17 to 06/02/17    PT Start Time  0948    PT Stop Time  1032    PT Time Calculation (min)  44 min    Equipment Utilized During Treatment  Gait belt    Activity Tolerance  Patient tolerated treatment well;Other (comment) SpO2 monitored through session, remained above 94%    Behavior During Therapy  [facility] for tasks assessed/performed       Past Medical History:  Diagnosis Date  . ASCVD (arteriosclerotic cardiovascular disease)     CABG in 04/1993; and negative stress nuclear study in 08/2001  . Benign essential tremor   . CAD (coronary artery disease)   . Cancer ([place])    skin  . Cerebrovascular disease    Right carotid bruit-40-69% left internal carotid artery stenosis in 4/06; followed [facility]  . Chronic kidney disease, stage II (mild)    Creatinine-1.6 in 09/2008  . COPD (chronic obstructive pulmonary disease) ([place])   . Degenerative joint disease of knee, left   . Gout   . Horseshoe kidney   . Hyperlipidemia   . Hypertension   . Lung cancer ([place]) 2013  . Normocytic anemia   . Prediabetes   . Pulmonary disease   . Renal insufficiency   . Syncope   . Tobacco abuse, in remission    40 pack years; discontinued in 1980    Past Surgical History:  Procedure Laterality Date  . CARDIAC SURGERY    . COLONOSCOPY W/ POLYPECTOMY  1985  . CORONARY ARTERY BYPASS GRAFT  1995  . LESION EXCISION    . PILONIDAL CYST EXCISION  1948    There were no vitals filed for this visit.  Subjective  Assessment - 05/31/17 0946    Subjective  Son states he will be calling about rt foot.    Patient is accompained by:  Family member son    Patient Stated Goals  get better    Pain Score  4     Pain Location  Foot    Pain Orientation  Right                      [facility] Adult PT Treatment/Exercise - 05/31/17 0001      Knee/Hip Exercises: Standing   Heel Raises  Both;15 reps    Heel Raises Limitations  heel and toe    Hip ADduction  --    Hip Abduction  Stengthening;Both;1 set;10 reps    Abduction Limitations  GTB    Hip Extension  Stengthening;Both;1 set;10 reps    Extension Limitations  GTB    Forward Step Up  Both;10 reps;Step Height: 6"    Forward Step Up Limitations  min cues for increased step length to clear step on step down    SLS  marching on foam 2x10 with 2-3" holds    Gait Training  fwd/retro gait 63f x2RT + tossing scarves    Other  Standing Knee Exercises  sidestepping with GTB 36f x2RT      Knee/Hip Exercises: Seated   Other Seated Knee/Hip Exercises  sit to stand + tossing blue scarf 2x5 reps             PT Education - 05/31/17 1040    Education provided  Yes    Education Details  form of therex and technieque throughout therapy session.    Person(s) Educated  Patient;Child(ren) son    Methods  Explanation    Comprehension  Verbalized understanding       PT Short Term Goals - 05/18/17 0904      PT SHORT TERM GOAL #1   Title  Pt will be independent with HEP and perform consistently in order to maximize overall strength and decrease risk for falls.     Baseline  05/18/17 - patietn/family reports compliance    Time  3    Period  Weeks    Status  Achieved    Target Date  05/12/17      PT SHORT TERM GOAL #2   Title  Pt will have 1/2 grade improvement in MMT of all muscle groups tested in order to maximize gait, balance, and decrease risk for falls.    Baseline  05/18/17 - improvement for several muscle groups by 1/2 grade but not all     Time  3    Period  Weeks    Status  Partially Met      PT SHORT TERM GOAL #3   Title  Pt will be able to perform bil SLS for 10 sec or > with no UE support in order to maximize gait over uneven ground.    Baseline  05/18/17 - Rt LE: 0-3 seconds, Lt LE: 0-6 seconds    Status  On-going      PT SHORT TERM GOAL #4   Title  --    Status  --        PT Long Term Goals - 05/18/17 0905      PT LONG TERM GOAL #1   Title  Pt will have at least 1 grade improvement in MMT of all muscle groups tested to maximzie transfers, gait, and allow him to complete ADLs with greater ease.     Baseline  05/18/17 - improvement for several muscle groups by 1/2 grade but not all    Time  6    Period  Weeks    Status  On-going    Target Date  06/02/17      PT LONG TERM GOAL #2   Title  Pt will be able to ambulate at least 6032fduring the 3MWT with LRAD and without requiring any rest breaks to demo improved strength and endurance to improve his community access.     Baseline  05/18/17 - 442 feet during 3MWT with no device    Time  6    Period  Weeks    Status  On-going      PT LONG TERM GOAL #3   Title  Pt will score 20/24 or > on the DGI to demo improved dynamic balance and decrease his risk for falls.     Baseline  05/18/17 - 16/21 = remains a fall risk    Time  6    Period  Weeks    Status  On-going      PT LONG TERM GOAL #4   Title  Pt report going to the local fitness center at least  3 days/week and performing his regular exercise routine to demo improved overall function.    Time  6    Period  Weeks    Status  On-going      PT LONG TERM GOAL #5   Title  --    Status  --            Plan - 05/31/17 1042    Clinical Impression Statement  continued with established therex focusing on balance, coordination, gluteal and functional strengthening. O2 sat levels dud desat to 80% on two exercises 1 of which patient was standing for a longer amount of time. Whenever patient desated to 90%, attempted  to regain above 90% in standing but required sitting position to regain >90% O2 sat levels. In general O2 sats staying >90%. Continue as planned, progressing as tolerated.     Rehab Potential  Fair    PT Frequency  2x / week    PT Duration  6 weeks    PT Treatment/Interventions  ADLs/Self Care Home Management;Cryotherapy;Electrical Stimulation;Moist Heat;DME Instruction;Gait training;Stair training;Functional mobility training;Therapeutic activities;Therapeutic exercise;Balance training;Neuromuscular re-education;Patient/family education;Manual techniques;Passive range of motion;Dry needling;Taping;Energy conservation    PT Next Visit Plan  re-assess 06/02/2017, discontinue exercises that repeatedly decrease oxygen saturation, which tend to be higher level balance skills for him. Progress with lower extremity strengthening as tolerated while continuing to monitor O2 saturation. Continue step ups and focus on hip flexor/extensor/abductor strenghtening. Perform gait trainer for reciprocol step pattern. contine PWR moves; continue cone taps on foam    PT Home Exercise Plan  eval: sit to stands; 05/07/2017 - up/down stairs with railing using rediprocal pattern; 2/7: standing heel to toe, sit to stands (son might add scarf if he can find one), standin abd and ext with GTB    Consulted and Agree with Plan of Care  Family member/caregiver;Patient    Family Member Consulted  Son       Patient will benefit from skilled therapeutic intervention in order to improve the following deficits and impairments:  Abnormal gait, Cardiopulmonary status limiting activity, Decreased activity tolerance, Decreased balance, Decreased coordination, Decreased endurance, Decreased knowledge of use of DME, Decreased strength, Difficulty walking, Hypomobility, Increased muscle spasms, Impaired perceived functional ability, Impaired flexibility, Improper body mechanics, Postural dysfunction  Visit Diagnosis: Unsteadiness on  feet  Muscle weakness (generalized)  Repeated falls     Problem List Patient Active Problem List   Diagnosis Date Noted  . Pedal edema 05/14/2017  . Parkinson's disease ([place]) 10/05/2016  . Diastolic CHF, chronic ([place]) 05/10/2015  . Dizziness   . Coronary artery disease involving coronary bypass graft of native heart without angina pectoris   . Near syncope 04/02/2015  . Diarrhea 04/02/2015  . COPD exacerbation ([place]) 06/04/2013  . SOB (shortness of breath) 06/04/2013  . Acute respiratory failure with hypoxia ([place]) 06/04/2013  . Hemoptysis 08/10/2012  . Prediabetes 08/28/2011  . Lung cancer ([place]) 08/25/2011  . COPD (chronic obstructive pulmonary disease) ([place]) 08/25/2011  . CKD (chronic kidney disease), stage III ([place]) 12/07/2010  . Chest pain 08/18/2010  . Syncope   . Tobacco abuse, in remission   . Degenerative joint disease of knee, left   . COLONIC POLYPS 05/20/2009  . Hyperlipidemia 05/20/2009  . ANEMIA 05/20/2009  . Essential tremor 05/20/2009  . ATHEROSCLEROTIC CARDIOVASCULAR DISEASE 05/20/2009  . CEREBROVASCULAR DISEASE 05/20/2009  . Essential hypertension 01/14/2009    Floria Raveling. Hartnett-Rands, MS, PT [facility] #[id number] 05/31/2017, 10:53 AM  [place]  [facility] [address] [place], [place], [address] Phone: [phone]   Fax:  [phone]  Name: [name] MRN: [id number] Date of Birth: [birth year]

## 2017-06-02 ENCOUNTER — Ambulatory Visit (HOSPITAL_COMMUNITY): Payer: Medicare Other

## 2017-06-02 ENCOUNTER — Encounter (HOSPITAL_COMMUNITY): Payer: Self-pay

## 2017-06-02 DIAGNOSIS — R2681 Unsteadiness on feet: Secondary | ICD-10-CM | POA: Diagnosis not present

## 2017-06-02 DIAGNOSIS — R296 Repeated falls: Secondary | ICD-10-CM

## 2017-06-02 DIAGNOSIS — M6281 Muscle weakness (generalized): Secondary | ICD-10-CM

## 2017-06-02 NOTE — Therapy (Signed)
[place], [place], [address] Phone: [phone]   Fax:  [phone]  Physical Therapy Treatment/Discharge summary  Patient Details  Name: [name] MRN: [id number] Date of Birth: [birth year] Referring Provider: Raphael Gibney, MD   Encounter Date: 06/02/2017  PT End of Session - 06/02/17 0851    Visit Number  13    Number of Visits  13    Date for PT Re-Evaluation  06/02/17    Authorization Type  [employer]HC Medicare    Authorization Time Period  04/21/17 to 06/02/17    PT Start Time  0850    PT Stop Time  0935    PT Time Calculation (min)  45 min    Equipment Utilized During Treatment  Gait belt    Activity Tolerance  Patient tolerated treatment well;Other (comment) SpO2 monitored through session, remained above 94%    Behavior During Therapy  [facility] for tasks assessed/performed       Past Medical History:  Diagnosis Date  . ASCVD (arteriosclerotic cardiovascular disease)     CABG in 04/1993; and negative stress nuclear study in 08/2001  . Benign essential tremor   . CAD (coronary artery disease)   . Cancer ([place])    skin  . Cerebrovascular disease    Right carotid bruit-40-69% left internal carotid artery stenosis in 4/06; followed [facility]  . Chronic kidney disease, stage II (mild)    Creatinine-1.6 in 09/2008  . COPD (chronic obstructive pulmonary disease) ([place])   . Degenerative joint disease of knee, left   . Gout   . Horseshoe kidney   . Hyperlipidemia   . Hypertension   . Lung cancer ([place]) 2013  . Normocytic anemia   . Prediabetes   . Pulmonary disease   . Renal insufficiency   . Syncope   . Tobacco abuse, in remission    40 pack years; discontinued in 1980    Past Surgical History:  Procedure Laterality Date  . CARDIAC SURGERY    . COLONOSCOPY W/ POLYPECTOMY  1985  . CORONARY ARTERY BYPASS GRAFT  1995  . LESION EXCISION    . PILONIDAL CYST EXCISION  1948    There were no vitals filed for this  visit.  Subjective Assessment - 06/02/17 0851    Subjective  Pt reports that he feels good, all except his foot. They saw the podiatrist yesterday who gave him an antibiotic, took a culture, and did an x-ray.    Patient is accompained by:  Family member son    Patient Stated Goals  get better    Currently in Pain?  Yes    Pain Score  5     Pain Location  Foot    Pain Orientation  Right           [facility] PT Assessment - 06/02/17 0001      Assessment   Medical Diagnosis  COPD    Referring Provider  Raphael Gibney, MD    Next MD Visit  F/u with Dr. Wolfgang Phoenix for blood work and PRN    Prior Therapy  yes for balance about 2 years ago      Functional Tests   Functional tests  Single leg stance      Single Leg Stance   Comments  R: 3 sec or < L: 6 sec or <      Strength   Right Hip Extension  2+/5 was 2+    Right Hip ABduction  4+/5 was 4+    Left Hip Extension  3+/5 was 3-    Left Hip ABduction  4+/5 was 4+    Right Knee Flexion  5/5 was 4+    Left Knee Flexion  5/5 was 4+    Right Ankle Dorsiflexion  5/5 was 4+    Left Ankle Dorsiflexion  4+/5 was 4+      Ambulation/Gait   Ambulation Distance (Feet)  484 Feet was 442    Assistive device  None      Dynamic Gait Index   Level Surface  Mild Impairment    Change in Gait Speed  Mild Impairment    Gait with Horizontal Head Turns  Mild Impairment    Gait with Vertical Head Turns  Normal    Gait and Pivot Turn  Normal    Step Over Obstacle  Normal    Step Around Obstacles  Normal    Steps  Moderate Impairment    Total Score  19    DGI comment:  was 16/21           PT Education - 06/02/17 0851    Education provided  Yes    Education Details  discharge plans    Person(s) Educated  Patient;Child(ren)    Methods  Explanation    Comprehension  Verbalized understanding       PT Short Term Goals - 06/02/17 0854      PT SHORT TERM GOAL #1   Title  Pt will be independent with HEP and perform consistently in order to  maximize overall strength and decrease risk for falls.     Baseline  2/5: patient/family reports compliance    Time  3    Period  Weeks    Status  Achieved      PT SHORT TERM GOAL #2   Title  Pt will have 1/2 grade improvement in MMT of all muscle groups tested in order to maximize gait, balance, and decrease risk for falls.    Baseline  2/20: see MMT    Time  3    Period  Weeks    Status  Partially Met      PT SHORT TERM GOAL #3   Title  Pt will be able to perform bil SLS for 10 sec or > with no UE support in order to maximize gait over uneven ground.    Baseline  2/20: Rt LE: 3 seconds or <, Lt LE: 6 seconds or <    Status  On-going        PT Long Term Goals - 06/02/17 0854      PT LONG TERM GOAL #1   Title  Pt will have at least 1 grade improvement in MMT of all muscle groups tested to maximzie transfers, gait, and allow him to complete ADLs with greater ease.     Baseline  2/20: see MMT    Time  6    Period  Weeks    Status  Partially Met      PT LONG TERM GOAL #2   Title  Pt will be able to ambulate at least 663f during the 3MWT with LRAD and without requiring any rest breaks to demo improved strength and endurance to improve his community access.     Baseline  2/20: 484 feet during 3MWT with no device    Time  6    Period  Weeks    Status  On-going      PT  LONG TERM GOAL #3   Title  Pt will score 20/24 or > on the DGI to demo improved dynamic balance and decrease his risk for falls.     Baseline  2/20: 19/20    Time  6    Period  Weeks    Status  Partially Met      PT LONG TERM GOAL #4   Title  Pt report going to the local fitness center at least 3 days/week and performing his regular exercise routine to demo improved overall function.    Baseline  2/20: has been going 1-2x/week since coming to therapy but will return to 3x/week once therapy is complete    Time  6    Period  Weeks    Status  Achieved            Plan - 06/02/17 8101    Clinical  Impression Statement  PT reassessed pt's goals and outcome measures this date. Though he has not met all goals, he has made great progress towards goals as illustrated above. His functional strength, balance, and endurance have all improved AEB improvements in MMT, DGI, and 3MWT. Pt's son reports he feels his father has improved 75-80% since starting noting that his balance and hand-eye coordination have improved greatly. He states that his COPD and Parkinson's are his main limiting factors but realizes that these will not go away. Overall, pt has made good progress and he and his son feel confident that they can continue his exercises, strengthening, and balance activities independently at home and the pt intends to return to the gym 3x/week. At this time, pt is safe for discharge. PT provided pt and his son with extensive updated HEP and both verbalized understanding.     Rehab Potential  Fair    PT Frequency  2x / week    PT Duration  6 weeks    PT Treatment/Interventions  ADLs/Self Care Home Management;Cryotherapy;Electrical Stimulation;Moist Heat;DME Instruction;Gait training;Stair training;Functional mobility training;Therapeutic activities;Therapeutic exercise;Balance training;Neuromuscular re-education;Patient/family education;Manual techniques;Passive range of motion;Dry needling;Taping;Energy conservation    PT Next Visit Plan  discharged    PT Home Exercise Plan  eval: sit to stands; 05/07/2017 - up/down stairs with railing using rediprocal pattern; 2/7: standing heel to toe, sit to stands (son might add scarf if he can find one), standin abd and ext with GTB; 2/20: see below for extensive additions    Consulted and Agree with Plan of Care  Family member/caregiver;Patient    Family Member Consulted  Son       Patient will benefit from skilled therapeutic intervention in order to improve the following deficits and impairments:  Abnormal gait, Cardiopulmonary status limiting activity, Decreased  activity tolerance, Decreased balance, Decreased coordination, Decreased endurance, Decreased knowledge of use of DME, Decreased strength, Difficulty walking, Hypomobility, Increased muscle spasms, Impaired perceived functional ability, Impaired flexibility, Improper body mechanics, Postural dysfunction  Visit Diagnosis: Unsteadiness on feet  Muscle weakness (generalized)  Repeated falls     Problem List Patient Active Problem List   Diagnosis Date Noted  . Pedal edema 05/14/2017  . Parkinson's disease ([place]) 10/05/2016  . Diastolic CHF, chronic ([place]) 05/10/2015  . Dizziness   . Coronary artery disease involving coronary bypass graft of native heart without angina pectoris   . Near syncope 04/02/2015  . Diarrhea 04/02/2015  . COPD exacerbation ([place]) 06/04/2013  . SOB (shortness of breath) 06/04/2013  . Acute respiratory failure with hypoxia ([place]) 06/04/2013  . Hemoptysis 08/10/2012  . Prediabetes 08/28/2011  .  Lung cancer ([place]) 08/25/2011  . COPD (chronic obstructive pulmonary disease) ([place]) 08/25/2011  . CKD (chronic kidney disease), stage III ([place]) 12/07/2010  . Chest pain 08/18/2010  . Syncope   . Tobacco abuse, in remission   . Degenerative joint disease of knee, left   . COLONIC POLYPS 05/20/2009  . Hyperlipidemia 05/20/2009  . ANEMIA 05/20/2009  . Essential tremor 05/20/2009  . ATHEROSCLEROTIC CARDIOVASCULAR DISEASE 05/20/2009  . CEREBROVASCULAR DISEASE 05/20/2009  . Essential hypertension 01/14/2009     PHYSICAL THERAPY DISCHARGE SUMMARY  Visits from Start of Care: 13  Current functional level related to goals / functional outcomes: See above   Remaining deficits: See above   Education / Equipment: See below Plan: Patient agrees to discharge.  Patient goals were partially met. Patient is being discharged due to being pleased with the current functional level.  ?????         Geraldine Solar PT, [place] [address] [place], [place], [address] Phone: [phone]   Fax:  [phone]  Name: [name] MRN: [id number] Date of Birth: 16-Dec-1921

## 2017-06-02 NOTE — Patient Instructions (Signed)
  FRONT STEP-UPS  Step up onto stool or step with involved leg.  Step down leading with uninvolved leg.  Repeat.  2-3 sets of 10-15 reps   LATERAL STEP-UPS  Stand on stool or step with involved leg.  Lower the uninvolved leg until the heel touches the floor, then press back up using the muscles in the involved leg only.  Repeat.  2-3 sets of 10-15 reps   lateral walk at pole or counter   Standing at a support surface (role or counter top) use hands to assist with balance. Perform sideways walking down and back until you feel that you need to take break.   It may be help to place a chair close to allow for safe rest breaks.   10-15 laps at the kitchen counter with green band   SINGLE LEG STANCE - SLS  Stand on one leg and maintain your balance.  2-3 sets of 10-15 reps holding for as long as you can   See other handout for PWR! Exercises (Parkinson's exercises)

## 2017-06-20 ENCOUNTER — Other Ambulatory Visit: Payer: Self-pay | Admitting: Family Medicine

## 2017-06-30 ENCOUNTER — Emergency Department (HOSPITAL_COMMUNITY)
Admission: EM | Admit: 2017-06-30 | Discharge: 2017-06-30 | Disposition: A | Discharge status: Home / Self Care | Payer: Medicare Other | Attending: Emergency Medicine | Admitting: Emergency Medicine

## 2017-06-30 ENCOUNTER — Emergency Department (HOSPITAL_COMMUNITY): Payer: Medicare Other

## 2017-06-30 ENCOUNTER — Other Ambulatory Visit: Payer: Self-pay

## 2017-06-30 ENCOUNTER — Encounter (HOSPITAL_COMMUNITY): Payer: Self-pay | Admitting: Emergency Medicine

## 2017-06-30 DIAGNOSIS — Y93B1 Activity, exercise machines primarily for muscle strengthening: Secondary | ICD-10-CM | POA: Insufficient documentation

## 2017-06-30 DIAGNOSIS — N183 Chronic kidney disease, stage 3 (moderate): Secondary | ICD-10-CM | POA: Insufficient documentation

## 2017-06-30 DIAGNOSIS — Z8673 Personal history of transient ischemic attack (TIA), and cerebral infarction without residual deficits: Secondary | ICD-10-CM | POA: Insufficient documentation

## 2017-06-30 DIAGNOSIS — W228XXA Striking against or struck by other objects, initial encounter: Secondary | ICD-10-CM | POA: Insufficient documentation

## 2017-06-30 DIAGNOSIS — Z87891 Personal history of nicotine dependence: Secondary | ICD-10-CM | POA: Diagnosis not present

## 2017-06-30 DIAGNOSIS — I130 Hypertensive heart and chronic kidney disease with heart failure and stage 1 through stage 4 chronic kidney disease, or unspecified chronic kidney disease: Secondary | ICD-10-CM | POA: Diagnosis not present

## 2017-06-30 DIAGNOSIS — Z7982 Long term (current) use of aspirin: Secondary | ICD-10-CM | POA: Insufficient documentation

## 2017-06-30 DIAGNOSIS — S61412A Laceration without foreign body of left hand, initial encounter: Secondary | ICD-10-CM | POA: Insufficient documentation

## 2017-06-30 DIAGNOSIS — Y92838 Other recreation area as the place of occurrence of the external cause: Secondary | ICD-10-CM | POA: Diagnosis not present

## 2017-06-30 DIAGNOSIS — Y999 Unspecified external cause status: Secondary | ICD-10-CM | POA: Insufficient documentation

## 2017-06-30 DIAGNOSIS — Z85118 Personal history of other malignant neoplasm of bronchus and lung: Secondary | ICD-10-CM | POA: Diagnosis not present

## 2017-06-30 DIAGNOSIS — I5032 Chronic diastolic (congestive) heart failure: Secondary | ICD-10-CM | POA: Diagnosis not present

## 2017-06-30 DIAGNOSIS — Z79899 Other long term (current) drug therapy: Secondary | ICD-10-CM | POA: Insufficient documentation

## 2017-06-30 DIAGNOSIS — J449 Chronic obstructive pulmonary disease, unspecified: Secondary | ICD-10-CM | POA: Diagnosis not present

## 2017-06-30 MED ORDER — DOXYCYCLINE HYCLATE 100 MG PO CAPS: 100.0000 mg | ORAL_CAPSULE | Freq: Two times a day (BID) | ORAL | 0 refills | Status: AC

## 2017-06-30 MED ORDER — BUPIVACAINE HCL (PF) 0.5 % IJ SOLN
10.0000 mL | Freq: Once | INTRAMUSCULAR | Status: DC
  Filled 2017-06-30: qty 30

## 2017-06-30 NOTE — ED Notes (Signed)
Suture tray  Marcaine Benzoin Steristrips 4x4 packs x 2 Kling 4 in x 2

## 2017-06-30 NOTE — Discharge Instructions (Addendum)
You may remove the bandage after 24 hours. Clean the wound and surrounding area gently with tap water and mild soap. Rinse well and blot dry. Do not scrub the wound, as this may cause the wound edges to come apart. You may shower, but avoid submerging the wound, such as with a bath or swimming. Clean the wound daily to prevent infection. Do not use cleaners such as hydrogen peroxide or alcohol.   Please take all of your antibiotics until finished!   You may develop abdominal discomfort or diarrhea from the antibiotic.  You may help offset this with probiotics which you can buy or get in yogurt. Do not eat or take the probiotics until 2 hours after your antibiotic.   Scar reduction: Application of a topical antibiotic ointment, such as Neosporin, after the wound has begun to close and heal well can decrease scab formation and reduce scarring. After the wound has healed and wound closures have been removed, application of ointments such as Aquaphor can also reduce scar formation.  The key to scar reduction is keeping the skin well hydrated and supple. Drinking plenty of water throughout the day (At least eight 8oz glasses of water a day) is essential to staying well hydrated.  Sun exposure: Keep the wound out of the sun. After the wound has healed, continue to protect it from the sun by wearing protective clothing or applying sunscreen.  Pain: You may use Tylenol, naproxen, or ibuprofen for pain.  Suture/staple removal: Return to the ED or go to the PCP in 8-10 days for suture removal. However, this particular wound may take longer to heal.  Return to the ED sooner should the wound edges come apart or signs of infection arise, such as spreading redness, puffiness/swelling, pus draining from the wound, severe increase in pain, fever over 100.19F, or any other major issues.

## 2017-06-30 NOTE — ED Notes (Signed)
PA and student in for repair

## 2017-06-30 NOTE — ED Provider Notes (Signed)
[facility] EMERGENCY DEPARTMENT Provider Note   CSN: [id number] Arrival date & time: 06/30/17  1018     History   Chief Complaint Chief Complaint  Patient presents with  . Extremity Laceration    HPI [name] is a 84 y.o. male.  HPI   [name] is a 84 y.o. male, with a history of CAD, CKD, HTN, and COPD, presenting to the ED with a left hand injury which occurred shortly prior to arrival. Patient was at a fitness center lifting weights on a machine.  He slid off of the seat, the handle came loose from his group, and he lacerated the back of the left hand on an unknown object.  Pain is moderate, aching, nonradiating. Tetanus updated 2016.  Denies numbness, weakness, head injury, neck/back pain, other injuries, or any other complaints.     Past Medical History:  Diagnosis Date  . ASCVD (arteriosclerotic cardiovascular disease)     CABG in 04/1993; and negative stress nuclear study in 08/2001  . Benign essential tremor   . CAD (coronary artery disease)   . Cancer ([place])    skin  . Cerebrovascular disease    Right carotid bruit-40-69% left internal carotid artery stenosis in 4/06; followed [facility]  . Chronic kidney disease, stage II (mild)    Creatinine-1.6 in 09/2008  . COPD (chronic obstructive pulmonary disease) ([place])   . Degenerative joint disease of knee, left   . Gout   . Horseshoe kidney   . Hyperlipidemia   . Hypertension   . Lung cancer ([place]) 2013  . Normocytic anemia   . Prediabetes   . Pulmonary disease   . Renal insufficiency   . Syncope   . Tobacco abuse, in remission    40 pack years; discontinued in 1980    Patient Active Problem List   Diagnosis Date Noted  . Pedal edema 05/14/2017  . Parkinson's disease ([place]) 10/05/2016  . Diastolic CHF, chronic ([place]) 05/10/2015  . Dizziness   . Coronary artery disease involving coronary bypass graft of native heart without angina pectoris   . Near syncope 04/02/2015  . Diarrhea 04/02/2015  . COPD  exacerbation ([place]) 06/04/2013  . SOB (shortness of breath) 06/04/2013  . Acute respiratory failure with hypoxia ([place]) 06/04/2013  . Hemoptysis 08/10/2012  . Prediabetes 08/28/2011  . Lung cancer ([place]) 08/25/2011  . COPD (chronic obstructive pulmonary disease) ([place]) 08/25/2011  . CKD (chronic kidney disease), stage III ([place]) 12/07/2010  . Chest pain 08/18/2010  . Syncope   . Tobacco abuse, in remission   . Degenerative joint disease of knee, left   . COLONIC POLYPS 05/20/2009  . Hyperlipidemia 05/20/2009  . ANEMIA 05/20/2009  . Essential tremor 05/20/2009  . ATHEROSCLEROTIC CARDIOVASCULAR DISEASE 05/20/2009  . CEREBROVASCULAR DISEASE 05/20/2009  . Essential hypertension 01/14/2009    Past Surgical History:  Procedure Laterality Date  . CARDIAC SURGERY    . COLONOSCOPY W/ POLYPECTOMY  1985  . CORONARY ARTERY BYPASS GRAFT  1995  . LESION EXCISION    . PILONIDAL CYST EXCISION  1948       Home Medications    Prior to Admission medications   Medication Sig Start Date End Date Taking? Authorizing Provider  albuterol (VENTOLIN HFA) 108 (90 Base) MCG/ACT inhaler INHALE 2 PUFFS INTO THE LUNGS EVERY 6 HOURS AS NEEDED FOR SHORTNESS OF BREATH 07/13/16   Kathyrn Drown, MD  Artificial Tear Solution (SOOTHE XP) SOLN Place 1-2 drops into both eyes 2 (two) times daily  as needed (for dry eyes).     [provider]  aspirin EC 81 MG tablet Take 81 mg by mouth daily.    [provider]  atorvastatin (LIPITOR) 40 MG tablet TAKE 1 TABLET (40 MG TOTAL) BY MOUTH DAILY. 06/21/17   Kathyrn Drown, MD  budesonide-formoterol (SYMBICORT) 160-4.5 MCG/ACT inhaler INHALE 2 PUFF INTO THE LUNGS 2 (TWO) TIMES DAILY. Patient taking differently: Inhale 2 puffs into the lungs 2 (two) times daily.  07/13/16   Kathyrn Drown, MD  carbidopa-levodopa (SINEMET IR) 25-100 MG tablet Take 1.5 tablets by mouth three times a day at 0600/1200/1730 10/01/16   [provider]  docusate sodium  (COLACE) 100 MG capsule Take 100 mg by mouth daily as needed for mild constipation.    [provider]  doxycycline (VIBRAMYCIN) 100 MG capsule Take 1 capsule (100 mg total) by mouth 2 (two) times daily for 7 days. 06/30/17 07/07/17  Joy, Shawn C, PA-C  fluticasone (FLONASE) 50 MCG/ACT nasal spray Place 1-2 sprays into both nostrils daily as needed for allergies or rhinitis.     [provider]  Loperamide HCl (IMODIUM A-D PO) Take 1 tablet by mouth every 4 (four) hours as needed (for).     [provider]  metoprolol tartrate (LOPRESSOR) 50 MG tablet Take 0.5 tablets (25 mg total) by mouth 2 (two) times daily. 04/08/17   Hongalgi, Lenis Dickinson, MD  Multiple Vitamins-Minerals (CENTRUM SILVER 50+MEN) TABS Take 1 tablet by mouth daily.    [provider]  nitroGLYCERIN (NITROSTAT) 0.4 MG SL tablet Place 1 tablet (0.4 mg total) under the tongue every 5 (five) minutes as needed for chest pain. May take up to 3 doses per episode. 04/08/17   Hongalgi, Lenis Dickinson, MD  primidone (MYSOLINE) 50 MG tablet Take 200 mg by mouth at bedtime.  03/08/17 03/08/18  [provider]  tamsulosin (FLOMAX) 0.4 MG CAPS capsule Take 1 capsule (0.4 mg total) by mouth at bedtime. 07/13/16   Kathyrn Drown, MD  torsemide (DEMADEX) 20 MG tablet Take 1 tablet (20 mg total) by mouth daily. 04/08/17   Modena Jansky, MD    Family History Family History  Problem Relation Age of Onset  . Cancer Brother   . Heart attack Brother     Social History Social History   Tobacco Use  . Smoking status: Former Smoker    Packs/day: 1.00    Years: 40.00    Pack years: 40.00    Types: Cigarettes    Last attempt to quit: 08/14/1973    Years since quitting: 43.9  . Smokeless tobacco: Former Systems developer    Types: [place] date: 12/05/1980  Substance Use Topics  . Alcohol use: No    Alcohol/week: 0.0 oz  . Drug use: No     Allergies   Propranolol; Levaquin [levofloxacin in d5w]; Penicillins;  Sulfonamide derivatives; Sympathomimetics; and Tape   Review of Systems Review of Systems  Skin: Positive for wound.  Neurological: Negative for weakness and numbness.     Physical Exam Updated Vital Signs BP (!) 152/58 (BP Location: Right Arm)   Pulse 66   Temp 97.7 F (36.5 C) (Oral)   Resp 18   Ht 5\' 7"  (1.702 m)   Wt 60.8 kg (134 lb)   SpO2 99%   BMI 20.99 kg/m   Physical Exam  Constitutional: He appears well-developed and well-nourished. No distress.  HENT:  Head: Normocephalic and atraumatic.  Eyes: Conjunctivae  are normal.  Neck: Neck supple.  Cardiovascular: Normal rate, regular rhythm and intact distal pulses.  Pulmonary/Chest: Effort normal.  Musculoskeletal: He exhibits tenderness.  Some tenderness noted to the wound itself.  Bruising to surrounding region. Full range of motion in the DIP, PIP, and MCP joints of the left hand. Full range of motion without pain through the cardinal directions of the left wrist. No noted swelling, instability, deformity, or other abnormality noted.  Neurological: He is alert.  No sensory deficits noted. Abduction and adduction of the fingers intact against resistance. Grip strength equal bilaterally. Strength 5/5 through the cardinal directions of the bilateral wrists. Strength 5/5 with flexion and extension of the bilateral elbows. Patient can touch the thumb to each one of the fingertips without difficulty.   Skin: Skin is warm and dry. Capillary refill takes less than 2 seconds. He is not diaphoretic. No pallor.  Approximately 7 cm laceration to the dorsum of the left hand.  Hemorrhage initially controlled, however, after exam wound began to hemorrhage with steady flow.  No noted central point of bleeding and no pulsatile bleeding.  Psychiatric: He has a normal mood and affect. His behavior is normal.  Nursing note and vitals reviewed.              ED Treatments / Results  Labs (all labs ordered are listed,  but only abnormal results are displayed) Labs Reviewed - No data to display  EKG  EKG Interpretation None       Radiology Dg Hand Complete Left  Result Date: 06/30/2017 CLINICAL DATA:  Direct trauma to the left hand at a fitness center today. Pain and bruising and abrasions here now. EXAM: LEFT HAND - COMPLETE 3+ VIEW COMPARISON:  Left hand series of November 22, 2014 FINDINGS: The bones are subjectively adequately mineralized. There is soft tissue swelling over the MCP joints. There is mild narrowing of the interphalangeal joints which is stable. There is narrowing of the first and second metacarpophalangeal joints which is also stable. There is severe degenerative change of the first [facility] joint which has progressed since the previous study. There are vascular calcifications. No acute fracture or dislocation is observed. IMPRESSION: There is soft tissue swelling over the MCP joints. There is mild to moderate interphalangeal and MCP joint degenerative change as described. There is severe degenerative change of the first [facility] joint. No acute fracture or dislocation. Electronically Signed   By: David  Martinique M.D.   On: 06/30/2017 10:51    Procedures .Marland KitchenLaceration Repair Date/Time: 06/30/2017 3:07 PM Performed by: Lorayne Bender, PA-C Authorized by: Lorayne Bender, PA-C   Consent:    Consent obtained:  Verbal   Consent given by:  Patient   Risks discussed:  Infection, need for additional repair, poor wound healing, poor cosmetic result, pain, tendon damage and vascular damage Anesthesia (see MAR for exact dosages):    Anesthesia method:  Local infiltration   Local anesthetic:  Bupivacaine 0.5% w/o epi Laceration details:    Location:  Hand   Hand location:  L hand, dorsum   Length (cm):  7 Repair type:    Repair type:  Complex Pre-procedure details:    Preparation:  Patient was prepped and draped in usual sterile fashion and imaging obtained to evaluate for foreign bodies Exploration:     Hemostasis achieved with:  Direct pressure   Wound exploration: wound explored through full range of motion and entire depth of wound probed and visualized   Treatment:  Area cleansed with:  Saline and Betadine   Amount of cleaning:  Extensive   Irrigation solution:  Sterile saline   Irrigation volume:  1000   Irrigation method:  Syringe   Debridement:  Minimal Skin repair:    Repair method:  Sutures   Suture size:  4-0   Suture material:  Prolene   Suture technique:  Horizontal mattress   Number of sutures:  13 Approximation:    Approximation:  Close Post-procedure details:    Dressing:  Non-adherent dressing, splint for protection and sterile dressing   Patient tolerance of procedure:  Tolerated well, no immediate complications Comments:     Steri-Strips were positioned along the edge of the wound to stabilize the thin skin and provide strength.    (including critical care time)  Medications Ordered in ED Medications  bupivacaine (MARCAINE) 0.5 % injection 10 mL (10 mLs Infiltration Handoff 06/30/17 1321)     Initial Impression / Assessment and Plan / ED Course  I have reviewed the triage vital signs and the nursing notes.  Pertinent labs & imaging results that were available during my care of the patient were reviewed by me and considered in my medical decision making (see chart for details).     Patient presents with large laceration to the dorsum of the left hand.  This was a complex laceration and required a great deal of time to repair.  Despite the time put into this repair, there were still areas where the skin was too thin and would not allow suturing.  These areas will need to heal by secondary intention.  Due to the size of the wound, involved the thin skin of the back of the hand, and the fact that it occurred at a gym, all put the patient at risk for poor wound healing.  However, I think the alternative prospect of leaving the wound completely open would further  increase risk of infection.  Patient was also prescribed course of antibiotics to further mitigate this risk. Patient and patient's son at the bedside both understand the need for close follow-up and wound care throughout the course of this wound. The patient was given instructions for home care as well as strict return precautions. Patient voices understanding of these instructions, accepts the plan, and is comfortable with discharge.    Findings and plan of care discussed with Milton Ferguson, MD. Dr. Roderic Palau personally evaluated and examined this patient.   Final Clinical Impressions(s) / ED Diagnoses   Final diagnoses:  Laceration of left hand without foreign body, initial encounter    ED Discharge Orders        Ordered    doxycycline (VIBRAMYCIN) 100 MG capsule  2 times daily     06/30/17 1514       Lorayne Bender, PA-C 06/30/17 1738    Milton Ferguson, MD 07/01/17 2108

## 2017-06-30 NOTE — ED Triage Notes (Addendum)
PT states he was at a fitness center this morning working out and his hand caught caught in a weight machine. Large skin tear/laceration with bleeding noted to left hand. Pressure dressing applied. Last Tdap 2016.

## 2017-06-30 NOTE — ED Notes (Signed)
Hospital admin in to assess care and to ascertain satisfaction

## 2017-07-01 ENCOUNTER — Ambulatory Visit: Payer: Medicare Other | Admitting: Family Medicine

## 2017-07-01 ENCOUNTER — Encounter: Payer: Self-pay | Admitting: Family Medicine

## 2017-07-01 ENCOUNTER — Other Ambulatory Visit: Payer: Self-pay | Admitting: *Deleted

## 2017-07-01 VITALS — BP 132/84 | Ht 67.0 in | Wt 135.6 lb

## 2017-07-01 DIAGNOSIS — S6990XS Unspecified injury of unspecified wrist, hand and finger(s), sequela: Secondary | ICD-10-CM

## 2017-07-01 DIAGNOSIS — S61412D Laceration without foreign body of left hand, subsequent encounter: Secondary | ICD-10-CM

## 2017-07-01 DIAGNOSIS — W228XXD Striking against or struck by other objects, subsequent encounter: Secondary | ICD-10-CM

## 2017-07-01 DIAGNOSIS — M79642 Pain in left hand: Secondary | ICD-10-CM

## 2017-07-01 MED ORDER — MUPIROCIN 2 % EX OINT: TOPICAL_OINTMENT | CUTANEOUS | 0 refills | Status: DC

## 2017-07-01 NOTE — Progress Notes (Addendum)
   Subjective:    Patient ID: [name], male    DOB: Jan 03, 1922, 84 y.o.   MRN: [id number]  HPI Pt here today for follow up from ER. Pt son states that ER doctor told him to have office look at laceration and change dressing. Also, would need order for home health nurse to come out and change dressing daily.  Patient enlarged tail in the hand.  No other particular troubles.  ER note was reviewed.  13 sutures were placed.  Patient does have paper thin skin.  X-ray was negative.  They did put him on an antibiotic.  Review of Systems He relates hand pain denies arm pain denies chest tightness pressure pain shortness breath no vomiting diarrhea    Objective:   Physical Exam  Lungs are clear heart regular.  Tremor is noted.  Extremities no edema.  Large skin tear noted they put multiple Steri-Strips on there along with some sutures.  Hopefully this will keep it together.  Family was concerned about the possibility of blockage in the artery.  But the palmar surface of his hand is nice and pink and does not appear cyanotic does have a little bit of swelling which is consistent with a tight bandage when the bandage was replaced we took care not to put it on to type warning signs discussed certainly if cyanosis or other problems follow-up immediately to ER    Assessment & Plan:  This patient had with dressing carefully changed Family will have home health help to see him on a daily basis for dressing changes He will have sutures removed next Thursday There is a concern that the skin may not take and that it may end up breaking down Continue antibiotics Patient is unable to come back and forth to the office Patient is homebound Face-to-face evaluation done today. Home health care is necessary for the patient's well-being Patient would also benefit from having a potentially help him with bathing a couple times a week if possible

## 2017-07-02 ENCOUNTER — Other Ambulatory Visit: Payer: Self-pay

## 2017-07-02 ENCOUNTER — Telehealth: Payer: Self-pay | Admitting: Family Medicine

## 2017-07-02 NOTE — Telephone Encounter (Signed)
Requesting verbal order for wound care.  Also, he is not taking Primidone or flonase anymore and Verdis Frederickson wanted to make sure that [name] was aware to take off med list.

## 2017-07-02 NOTE — Telephone Encounter (Signed)
Meds taken off; verbal order given since there was an order put in yesterday. Order given to [facility] with [facility]

## 2017-07-08 ENCOUNTER — Ambulatory Visit: Payer: Medicare Other | Admitting: Family Medicine

## 2017-07-08 ENCOUNTER — Encounter: Payer: Self-pay | Admitting: Family Medicine

## 2017-07-08 VITALS — BP 130/72 | Ht 67.0 in | Wt 134.0 lb

## 2017-07-08 DIAGNOSIS — S6990XS Unspecified injury of unspecified wrist, hand and finger(s), sequela: Secondary | ICD-10-CM

## 2017-07-08 DIAGNOSIS — W228XXD Striking against or struck by other objects, subsequent encounter: Secondary | ICD-10-CM

## 2017-07-08 DIAGNOSIS — L03114 Cellulitis of left upper limb: Secondary | ICD-10-CM

## 2017-07-08 MED ORDER — DOXYCYCLINE HYCLATE 100 MG PO TABS: 100.0000 mg | ORAL_TABLET | Freq: Two times a day (BID) | ORAL | 0 refills | Status: DC

## 2017-07-08 NOTE — Progress Notes (Signed)
   Subjective:    Patient ID: [name], male    DOB: [birth year], 84 y.o.   MRN: [id number]  HPIpt arrives today for suture removal.   Patient had sutures placed a little over a week ago.  Had a large skin tear see ER note see previous notes seeing pictures in addition this patient is here today to have the sutures removed having some puffiness in the back of his hand no significant pain or discomfort no pus drainage no fever chills sweats or vomiting  Review of Systems See above    Objective:   Physical Exam  Swelling in the dorsal part of the hand No sign of any type of pulmonary infection  May have some level of cellulitis although minimal     Assessment & Plan:  Sutures were removed with moderate difficulty the sutures were placed very tight and very narrow making it difficult to remove them it is felt that had success on 90% of the sutures to remove there was one particular suture it was difficult to remove and may have a small piece still within the skin hard to tell Antibiotics were prescribed follow-up in 1 week This area does show some signs that the skin tear is not totally healed up but does not need further sutures Warning signs regarding infection were discussed in detail

## 2017-07-09 ENCOUNTER — Telehealth: Payer: Self-pay | Admitting: Family Medicine

## 2017-07-09 NOTE — Telephone Encounter (Signed)
Please advise 

## 2017-07-09 NOTE — Telephone Encounter (Signed)
She may have a verbal order to do daily dressing changes to apply Bactroban ointment to any open wound area along the suture line.  Apply nonstick dressing no more need for Ace wrap.  Please advise the nurse that more than likely within the next several visits he will get to the point where family alone can do all of this we appreciate their help and also inform the nurse that we will be seen the patient next Wednesday or Thursday for follow-up

## 2017-07-09 NOTE — Telephone Encounter (Signed)
[name], nurse with [facility], is requesting order to change wound care as suggested by [name]; to stop ace wrap and apply the mupirocin ointment.

## 2017-07-09 NOTE — Telephone Encounter (Signed)
[name] with [facility] is aware.

## 2017-07-12 ENCOUNTER — Emergency Department (HOSPITAL_COMMUNITY): Payer: Medicare Other

## 2017-07-12 ENCOUNTER — Other Ambulatory Visit: Payer: Self-pay

## 2017-07-12 ENCOUNTER — Encounter (HOSPITAL_COMMUNITY): Payer: Self-pay | Admitting: Emergency Medicine

## 2017-07-12 ENCOUNTER — Emergency Department (HOSPITAL_COMMUNITY)
Admission: EM | Admit: 2017-07-12 | Discharge: 2017-07-12 | Disposition: A | Discharge status: Home / Self Care | Payer: Medicare Other | Attending: Emergency Medicine | Admitting: Emergency Medicine

## 2017-07-12 DIAGNOSIS — Z87891 Personal history of nicotine dependence: Secondary | ICD-10-CM | POA: Insufficient documentation

## 2017-07-12 DIAGNOSIS — W010XXA Fall on same level from slipping, tripping and stumbling without subsequent striking against object, initial encounter: Secondary | ICD-10-CM | POA: Insufficient documentation

## 2017-07-12 DIAGNOSIS — S51012A Laceration without foreign body of left elbow, initial encounter: Secondary | ICD-10-CM | POA: Diagnosis not present

## 2017-07-12 DIAGNOSIS — I5032 Chronic diastolic (congestive) heart failure: Secondary | ICD-10-CM | POA: Insufficient documentation

## 2017-07-12 DIAGNOSIS — I130 Hypertensive heart and chronic kidney disease with heart failure and stage 1 through stage 4 chronic kidney disease, or unspecified chronic kidney disease: Secondary | ICD-10-CM | POA: Insufficient documentation

## 2017-07-12 DIAGNOSIS — Y929 Unspecified place or not applicable: Secondary | ICD-10-CM | POA: Insufficient documentation

## 2017-07-12 DIAGNOSIS — I2510 Atherosclerotic heart disease of native coronary artery without angina pectoris: Secondary | ICD-10-CM | POA: Diagnosis not present

## 2017-07-12 DIAGNOSIS — J449 Chronic obstructive pulmonary disease, unspecified: Secondary | ICD-10-CM | POA: Diagnosis not present

## 2017-07-12 DIAGNOSIS — Y9301 Activity, walking, marching and hiking: Secondary | ICD-10-CM | POA: Insufficient documentation

## 2017-07-12 DIAGNOSIS — Y999 Unspecified external cause status: Secondary | ICD-10-CM | POA: Diagnosis not present

## 2017-07-12 DIAGNOSIS — N182 Chronic kidney disease, stage 2 (mild): Secondary | ICD-10-CM | POA: Insufficient documentation

## 2017-07-12 DIAGNOSIS — Z79899 Other long term (current) drug therapy: Secondary | ICD-10-CM | POA: Diagnosis not present

## 2017-07-12 LAB — COMPREHENSIVE METABOLIC PANEL
ALBUMIN: 3.3 g/dL — AB (ref 3.5–5.0)
ALT: 5 U/L — AB (ref 17–63)
ANION GAP: 9 (ref 5–15)
AST: 21 U/L (ref 15–41)
Alkaline Phosphatase: 81 U/L (ref 38–126)
BUN: 47 mg/dL — AB (ref 6–20)
CO2: 27 mmol/L (ref 22–32)
Calcium: 9 mg/dL (ref 8.9–10.3)
Chloride: 104 mmol/L (ref 101–111)
Creatinine, Ser: 2.34 mg/dL — ABNORMAL HIGH (ref 0.61–1.24)
GFR calc Af Amer: 26 mL/min — ABNORMAL LOW (ref >60)
GFR calc non Af Amer: 22 mL/min — ABNORMAL LOW (ref >60)
GLUCOSE: 115 mg/dL — AB (ref 65–99)
Potassium: 5.6 mmol/L — ABNORMAL HIGH (ref 3.5–5.1)
SODIUM: 140 mmol/L (ref 135–145)
Total Bilirubin: 0.8 mg/dL (ref 0.3–1.2)
Total Protein: 6.3 g/dL — ABNORMAL LOW (ref 6.5–8.1)

## 2017-07-12 LAB — URINALYSIS, ROUTINE W REFLEX MICROSCOPIC
Bilirubin Urine: NEGATIVE
GLUCOSE, UA: NEGATIVE mg/dL
Hgb urine dipstick: NEGATIVE
Ketones, ur: NEGATIVE mg/dL
Leukocytes, UA: NEGATIVE
Nitrite: NEGATIVE
Protein, ur: NEGATIVE mg/dL
Specific Gravity, Urine: 1.01 (ref 1.005–1.030)
pH: 7 (ref 5.0–8.0)

## 2017-07-12 LAB — CBC WITH DIFFERENTIAL/PLATELET
BASOS ABS: 0 10*3/uL (ref 0.0–0.1)
BASOS PCT: 0 %
EOS ABS: 0.1 10*3/uL (ref 0.0–0.7)
Eosinophils Relative: 1 %
HEMATOCRIT: 31.4 % — AB (ref 39.0–52.0)
HEMOGLOBIN: 9.8 g/dL — AB (ref 13.0–17.0)
Lymphocytes Relative: 20 %
Lymphs Abs: 1.4 10*3/uL (ref 0.7–4.0)
MCH: 31.1 pg (ref 26.0–34.0)
MCHC: 31.2 g/dL (ref 30.0–36.0)
MCV: 99.7 fL (ref 78.0–100.0)
MONOS PCT: 11 %
Monocytes Absolute: 0.8 10*3/uL (ref 0.1–1.0)
NEUTROS ABS: 4.7 10*3/uL (ref 1.7–7.7)
Neutrophils Relative %: 68 %
Platelets: 237 10*3/uL (ref 150–400)
RBC: 3.15 MIL/uL — AB (ref 4.22–5.81)
RDW: 14.5 % (ref 11.5–15.5)
WBC: 7 10*3/uL (ref 4.0–10.5)

## 2017-07-12 LAB — TROPONIN I: Troponin I: <0.03 ng/mL (ref <0.03)

## 2017-07-12 NOTE — ED Provider Notes (Signed)
[facility] EMERGENCY DEPARTMENT Provider Note   CSN: [id number] Arrival date & time: 07/12/17  1313     History   Chief Complaint Chief Complaint  Patient presents with  . Fall    HPI [name] is a 84 y.o. male.  Chief complaint is fall  HPI 84 year old male.  Here with wife, and family.  Had a fall last week and has a hand laceration.  He was lifting weights when that happened.  Today he was walking from his kitchen to his front room.  Wife states that he walked right into the chair she was sitting and fell on top of her.  Son is concerned that he may have passed out although the patient feels as though he simply walked into the chair.  He has multiple forearm skin tears without full-thickness laceration.  He claims no other injuries.  States that he was evaluated for "falls and passing out" in November December.  Per his chart review he was admitted for dyspnea but not syncope.  He has chronic anemia.  He reports no chest pain shortness of breath or recent change in baseline health.  He does not walk with a walker.  Past Medical History:  Diagnosis Date  . ASCVD (arteriosclerotic cardiovascular disease)     CABG in 04/1993; and negative stress nuclear study in 08/2001  . Benign essential tremor   . CAD (coronary artery disease)   . Cancer ([place])    skin  . Cerebrovascular disease    Right carotid bruit-40-69% left internal carotid artery stenosis in 4/06; followed [facility]  . Chronic kidney disease, stage II (mild)    Creatinine-1.6 in 09/2008  . COPD (chronic obstructive pulmonary disease) ([place])   . Degenerative joint disease of knee, left   . Gout   . Horseshoe kidney   . Hyperlipidemia   . Hypertension   . Lung cancer ([place]) 2013  . Normocytic anemia   . Prediabetes   . Pulmonary disease   . Renal insufficiency   . Syncope   . Tobacco abuse, in remission    40 pack years; discontinued in 1980    Patient Active Problem List   Diagnosis Date Noted  . Pedal edema  05/14/2017  . Parkinson's disease ([place]) 10/05/2016  . Diastolic CHF, chronic ([place]) 05/10/2015  . Dizziness   . Coronary artery disease involving coronary bypass graft of native heart without angina pectoris   . Near syncope 04/02/2015  . Diarrhea 04/02/2015  . COPD exacerbation ([place]) 06/04/2013  . SOB (shortness of breath) 06/04/2013  . Acute respiratory failure with hypoxia ([place]) 06/04/2013  . Hemoptysis 08/10/2012  . Prediabetes 08/28/2011  . Lung cancer ([place]) 08/25/2011  . COPD (chronic obstructive pulmonary disease) ([place]) 08/25/2011  . CKD (chronic kidney disease), stage III ([place]) 12/07/2010  . Chest pain 08/18/2010  . Syncope   . Tobacco abuse, in remission   . Degenerative joint disease of knee, left   . COLONIC POLYPS 05/20/2009  . Hyperlipidemia 05/20/2009  . ANEMIA 05/20/2009  . Essential tremor 05/20/2009  . ATHEROSCLEROTIC CARDIOVASCULAR DISEASE 05/20/2009  . CEREBROVASCULAR DISEASE 05/20/2009  . Essential hypertension 01/14/2009    Past Surgical History:  Procedure Laterality Date  . CARDIAC SURGERY    . COLONOSCOPY W/ POLYPECTOMY  1985  . CORONARY ARTERY BYPASS GRAFT  1995  . LESION EXCISION    . PILONIDAL CYST EXCISION  1948        Home Medications    Prior to Admission medications  Medication Sig Start Date End Date Taking? Authorizing Provider  albuterol (VENTOLIN HFA) 108 (90 Base) MCG/ACT inhaler INHALE 2 PUFFS INTO THE LUNGS EVERY 6 HOURS AS NEEDED FOR SHORTNESS OF BREATH 07/13/16  Yes Kathyrn Drown, MD  Artificial Tear Solution (SOOTHE XP) SOLN Place 1-2 drops into both eyes 2 (two) times daily as needed (for dry eyes).    Yes [provider]  aspirin EC 81 MG tablet Take 81 mg by mouth daily.   Yes [provider]  atorvastatin (LIPITOR) 40 MG tablet TAKE 1 TABLET (40 MG TOTAL) BY MOUTH DAILY. Patient taking differently: TAKE 1 TABLET (40 MG TOTAL) BY MOUTH DAILY IN THE EVENING 06/21/17  Yes Luking, Elayne Snare, MD    budesonide-formoterol (SYMBICORT) 160-4.5 MCG/ACT inhaler INHALE 2 PUFF INTO THE LUNGS 2 (TWO) TIMES DAILY. Patient taking differently: Inhale 2 puffs into the lungs 2 (two) times daily.  07/13/16  Yes Kathyrn Drown, MD  carbidopa-levodopa (SINEMET IR) 25-100 MG tablet Take 1.5 tablets by mouth three times a day at 0600/1200/1730 10/01/16  Yes [provider]  docusate sodium (COLACE) 100 MG capsule Take 100 mg by mouth daily as needed for mild constipation.   Yes [provider]  doxycycline (VIBRA-TABS) 100 MG tablet Take 1 tablet (100 mg total) by mouth 2 (two) times daily. 07/08/17  Yes Kathyrn Drown, MD  metoprolol tartrate (LOPRESSOR) 50 MG tablet Take 0.5 tablets (25 mg total) by mouth 2 (two) times daily. 04/08/17  Yes Hongalgi, Lenis Dickinson, MD  Multiple Vitamins-Minerals (CENTRUM SILVER 50+MEN) TABS Take 1 tablet by mouth daily.   Yes [provider]  nitroGLYCERIN (NITROSTAT) 0.4 MG SL tablet Place 1 tablet (0.4 mg total) under the tongue every 5 (five) minutes as needed for chest pain. May take up to 3 doses per episode. 04/08/17  Yes Hongalgi, Lenis Dickinson, MD  tamsulosin (FLOMAX) 0.4 MG CAPS capsule Take 1 capsule (0.4 mg total) by mouth at bedtime. Patient taking differently: Take 0.4 mg by mouth every evening.  07/13/16  Yes Kathyrn Drown, MD  torsemide (DEMADEX) 20 MG tablet Take 1 tablet (20 mg total) by mouth daily. 04/08/17  Yes Hongalgi, Lenis Dickinson, MD    Family History Family History  Problem Relation Age of Onset  . Cancer Brother   . Heart attack Brother     Social History Social History   Tobacco Use  . Smoking status: Former Smoker    Packs/day: 1.00    Years: 40.00    Pack years: 40.00    Types: Cigarettes    Last attempt to quit: 08/14/1973    Years since quitting: 43.9  . Smokeless tobacco: Former Systems developer    Types: [place] date: 12/05/1980  Substance Use Topics  . Alcohol use: No    Alcohol/week: 0.0 oz  . Drug use: No      Allergies   Propranolol; Levaquin [levofloxacin in d5w]; Penicillins; Sulfonamide derivatives; Sympathomimetics; and Tape   Review of Systems Review of Systems  Constitutional: Negative for appetite change, chills, diaphoresis, fatigue and fever.  HENT: Negative for mouth sores, sore throat and trouble swallowing.   Eyes: Negative for visual disturbance.  Respiratory: Negative for cough, chest tightness, shortness of breath and wheezing.   Cardiovascular: Negative for chest pain.  Gastrointestinal: Negative for abdominal distention, abdominal pain, diarrhea, nausea and vomiting.  Endocrine: Negative for polydipsia, polyphagia and polyuria.  Genitourinary: Negative for dysuria, frequency and hematuria.  Musculoskeletal: Negative for gait  problem.  Skin: Negative for color change, pallor and rash.       Skin tears to the left forearm.  Healing laceration left hand  Neurological: Negative for dizziness, syncope, light-headedness and headaches.  Hematological: Does not bruise/bleed easily.  Psychiatric/Behavioral: Negative for behavioral problems and confusion.     Physical Exam Updated Vital Signs BP (!) 153/64   Pulse 70   Temp 98.1 F (36.7 C) (Oral)   Resp 17   SpO2 99%   Physical Exam  Constitutional: He appears well-nourished.  Thin.  Elderly.  Somewhat frail-appearing.  Hard of hearing but oriented  HENT:  No sign of trauma to the head.  Conjunctive not injected or pale  Eyes:  Symmetric reactive pupils.  Neck:  No neck pain or tenderness  Cardiovascular:  Regular.  Occasional ectopic atrial beat  Pulmonary/Chest:  Clear breath sounds.  Abdominal:  Soft benign abdomen  Musculoskeletal:  Multiple skin tears without full-thickness laceration left arm.  Full range of motion wrist and elbow.  Neurological:  Oriented.  Symmetric nonfocal neurological exam     ED Treatments / Results  Labs (all labs ordered are listed, but only abnormal results are  displayed) Labs Reviewed  CBC WITH DIFFERENTIAL/PLATELET - Abnormal; Notable for the following components:      Result Value   RBC 3.15 (*)    Hemoglobin 9.8 (*)    HCT 31.4 (*)    All other components within normal limits  COMPREHENSIVE METABOLIC PANEL - Abnormal; Notable for the following components:   Potassium 5.6 (*)    Glucose, Bld 115 (*)    BUN 47 (*)    Creatinine, Ser 2.34 (*)    Total Protein 6.3 (*)    Albumin 3.3 (*)    ALT 5 (*)    GFR calc non Af Amer 22 (*)    GFR calc Af Amer 26 (*)    All other components within normal limits  URINALYSIS, ROUTINE W REFLEX MICROSCOPIC  TROPONIN I    EKG EKG Interpretation  Date/Time:  Monday July 12 2017 15:20:26 EDT Ventricular Rate:  74 PR Interval:    QRS Duration: 152 QT Interval:  461 QTC Calculation: 512 R Axis:   -125 Text Interpretation:  Artifact limits rhythm interpretation. sinus with PACs vs AF Right bundle branch block Confirmed by Tanna Furry [phone]) on 07/12/2017 4:17:36 PM   Radiology Dg Forearm Left  Result Date: 07/12/2017 CLINICAL DATA:  Status post fall with pain and abrasions to the left hand. EXAM: LEFT FOREARM - 2 VIEW COMPARISON:  07/10/2017 FINDINGS: Soft tissue once is seen within the dorsal mid left forearm tissues. There is no underlying fracture. Vascular calcifications are noted. Osteoarthritic changes at the radiocarpal joint and first carpometacarpal joint. IMPRESSION: No acute fracture or dislocation identified about the left forearm. Posterior soft tissue laceration. Electronically Signed   By: Fidela Salisbury M.D.   On: 07/12/2017 13:55    Procedures Procedures (including critical care time)  Medications Ordered in ED Medications - No data to display   Initial Impression / Assessment and Plan / ED Course  I have reviewed the triage vital signs and the nursing notes.  Pertinent labs & imaging results that were available during my care of the patient were reviewed by me and  considered in my medical decision making (see chart for details).    Forearm shows no abnormalities on x-ray.  EKG shows no significant change versus comparison.  Troponin and basic metabolic without change other  than chronic mild anemia.  I think is appropriate for discharge home.  I have requested a walker with him through home health.  Primary care follow-up.  ER with recurrence  Final Clinical Impressions(s) / ED Diagnoses   Final diagnoses:  Skin tear of left elbow without complication, initial encounter    ED Discharge Orders        [place]     07/12/17 1911    Face-to-face encounter (required for Medicare/Medicaid patients)    Comments:  I Lolita Patella certify that this patient is under my care and that I, or a nurse practitioner or physician's assistant working with me, had a face-to-face encounter that meets the physician face-to-face encounter requirements with this patient on 07/12/2017. The encounter with the patient was in whole, or in part for the following medical condition(s) which is the primary reason for home health care (List medical condition): Patient with multiple recent falls.  Will require home walker and home health evaluation   07/12/17 1911       Tanna Furry, MD 07/12/17 2322

## 2017-07-12 NOTE — ED Triage Notes (Signed)
Pt fell today. Stays with son. Wife states pt started hollaring when he fell. Pt has dementia. Pt here last week for fall. New skin tears noted to left elbow and FA. Old bruising note dto right hand and left hand from fall last week. Denies hitting head

## 2017-07-12 NOTE — Discharge Instructions (Signed)
Change dressing daily starting on Wednesday. Use walker as directed

## 2017-07-13 ENCOUNTER — Other Ambulatory Visit: Payer: Self-pay | Admitting: Family Medicine

## 2017-07-14 ENCOUNTER — Encounter: Payer: Self-pay | Admitting: Family Medicine

## 2017-07-14 ENCOUNTER — Ambulatory Visit: Payer: Medicare Other | Admitting: Family Medicine

## 2017-07-14 VITALS — BP 140/60 | Temp 98.4°F | Ht 67.0 in | Wt 134.0 lb

## 2017-07-14 DIAGNOSIS — M79602 Pain in left arm: Secondary | ICD-10-CM

## 2017-07-14 DIAGNOSIS — R270 Ataxia, unspecified: Secondary | ICD-10-CM | POA: Diagnosis not present

## 2017-07-14 DIAGNOSIS — G20 Parkinson's disease: Secondary | ICD-10-CM

## 2017-07-14 DIAGNOSIS — W01198D Fall on same level from slipping, tripping and stumbling with subsequent striking against other object, subsequent encounter: Secondary | ICD-10-CM

## 2017-07-14 DIAGNOSIS — S51012D Laceration without foreign body of left elbow, subsequent encounter: Secondary | ICD-10-CM

## 2017-07-14 NOTE — Progress Notes (Signed)
   Subjective:    Patient ID: [name], male    DOB: 1921-05-11, 84 y.o.   MRN: [id number]  HPI  Patient is here today to follow up on skin tear on his left hand,but has since then has fell again and tore up his upper left arm on Monday evening he states he passed out and fell. Ed told them he was anemic and could have caused the passing out.Hgb on 07/12/2017 was 9.8. [facility] comes in twice a week to help with baths two times per week and to change the bandage.Needs an order to extend them coming into home.Nurse Clarise Cruz phone # [phone]. Blood in the nose this am no bleeding,No visible signs of blood in stool or urine and needs a rx for a walker. Patient with large skin tear on the arm in addition to this large amount of bruising the bandage was removed without much difficulty Bactroban ointment was placed new bandage was placed wrapped up I was present during all of this 20 minutes was spent with the patient greater than half in discussion of his skin tears his ataxia and his falling.  Patient is able to do some walking with full assistance.  Patient is at risk of falls. Patient has ataxia and Parkinson's disease Review of Systems     Objective:   Physical Exam Lungs clear no crackles heart no murmur regular tremor noted large skin tears on the arm noted no vascular compromise.  Face-to-face evaluation Patient is homebound Would benefit from home health Would benefit from physical therapy for ataxia Would benefit from regular dressing changes     Assessment & Plan:  Ataxia due to age needs physical therapy possible walker use patient at risk for falling  Large skin tears daily dressing changes until healed follow-up again tomorrow  20 minutes spent with patient greater than half in discussion

## 2017-07-15 ENCOUNTER — Encounter: Payer: Self-pay | Admitting: Family Medicine

## 2017-07-15 ENCOUNTER — Ambulatory Visit: Payer: Medicare Other | Admitting: Family Medicine

## 2017-07-15 ENCOUNTER — Telehealth: Payer: Self-pay | Admitting: Family Medicine

## 2017-07-15 VITALS — BP 138/54 | Temp 97.8°F | Ht 67.0 in | Wt 134.1 lb

## 2017-07-15 DIAGNOSIS — S51012S Laceration without foreign body of left elbow, sequela: Secondary | ICD-10-CM

## 2017-07-15 DIAGNOSIS — M79602 Pain in left arm: Secondary | ICD-10-CM

## 2017-07-15 DIAGNOSIS — W01198S Fall on same level from slipping, tripping and stumbling with subsequent striking against other object, sequela: Secondary | ICD-10-CM

## 2017-07-15 DIAGNOSIS — S51012D Laceration without foreign body of left elbow, subsequent encounter: Secondary | ICD-10-CM

## 2017-07-15 NOTE — Progress Notes (Signed)
   Subjective:    Patient ID: [name], male    DOB: May 21, 1921, 84 y.o.   MRN: [id number]  HPI Patient is here today for a dressing change on his left arm after an injury due to a fall. Extensive skin tears on the left arm.  Here today to have the bandage removed and dressing change I was present during the full time of taking the bandage off cleaning the wound and redressing it with Bactroban nonstick dressing and wrap Patient is homebound Home physical therapy has been consulted for ataxia to help prevent falls home health has been consulted to help with dressing changes Dressing change was done today without difficulty but did take 15-20 minutes in order to complete this task my presence was with the patient during this full time greater than half the time was spent in discussing prevention of falls as well as how to treat the skin tears  Review of Systems     Objective:   Physical Exam Skin tears on the left arm no cellulitis skin tear on the hand is healing       Assessment & Plan:  Skin tears Hopefully he will be able to use a walker We will let physical therapy start first to recommend what walker to use Dressing change on a regular basis warnings discussed follow-up in 1 week

## 2017-07-15 NOTE — Telephone Encounter (Signed)
Twice weekly dressing changes would be fine with using antibiotic ointment as well as nonstick dressing-once it is healed to the point of no longer oozing or bleeding they can discontinue

## 2017-07-15 NOTE — Telephone Encounter (Signed)
This patient sustained a severe skin tear on the left arm over the weekend-this will need ongoing care this is a new injury.  We are requesting that home health do dressing changes on this patient.  I believe that the son can handle doing dressing changes on the weekends.  The son-Mike-stated that home health has requested a phone call to extend ongoing dressing changes for these new skin tears-please call Sara-[phone] this is their nurse-please give verbal order to extend wound care treatment for these new skin tears if any problems let us know

## 2017-07-15 NOTE — Telephone Encounter (Signed)
Contacted nurse Clarise Cruz and informed her of dressing change orders.

## 2017-07-15 NOTE — Telephone Encounter (Signed)
Spoke with [name] and she stated that with patients insurance she probable will not be able to get daily change, but she could try to get twice a week  Which would be the max insurance would pay  Max. Also, what kind of wound care? Please advise. Thanks

## 2017-07-15 NOTE — Addendum Note (Signed)
Addended by: Carmelina Noun on: 07/15/2017 12:00 PM   Modules accepted: Orders

## 2017-07-16 ENCOUNTER — Other Ambulatory Visit: Payer: Self-pay | Admitting: *Deleted

## 2017-07-16 ENCOUNTER — Telehealth: Payer: Self-pay | Admitting: *Deleted

## 2017-07-16 ENCOUNTER — Encounter: Payer: Self-pay | Admitting: Family Medicine

## 2017-07-16 ENCOUNTER — Ambulatory Visit: Payer: Medicare Other | Admitting: Family Medicine

## 2017-07-16 VITALS — Ht 67.0 in

## 2017-07-16 DIAGNOSIS — W01198D Fall on same level from slipping, tripping and stumbling with subsequent striking against other object, subsequent encounter: Secondary | ICD-10-CM | POA: Diagnosis not present

## 2017-07-16 DIAGNOSIS — S61412S Laceration without foreign body of left hand, sequela: Secondary | ICD-10-CM | POA: Diagnosis not present

## 2017-07-16 DIAGNOSIS — Z4800 Encounter for change or removal of nonsurgical wound dressing: Secondary | ICD-10-CM | POA: Diagnosis not present

## 2017-07-16 DIAGNOSIS — M79602 Pain in left arm: Secondary | ICD-10-CM | POA: Diagnosis not present

## 2017-07-16 DIAGNOSIS — S51012D Laceration without foreign body of left elbow, subsequent encounter: Secondary | ICD-10-CM

## 2017-07-16 MED ORDER — METOPROLOL TARTRATE 25 MG PO TABS: 25.0000 mg | ORAL_TABLET | Freq: Two times a day (BID) | ORAL | 5 refills | Status: DC

## 2017-07-16 NOTE — Telephone Encounter (Signed)
Sarah notified about verbal orders and son Ronalee Belts notified that dr Nicki Reaper changed metoprolol so he does not have to cut tablet. He agreed that would be better. Med sent to pharm.

## 2017-07-16 NOTE — Telephone Encounter (Signed)
Sarah from [facility] called to get verbal order for physical therapy. Ok per dr Nicki Reaper. A referral was put in yesterday for pt. [name] said all they needed was a verbal order. Message sent to [place] to cancel referral in epic

## 2017-07-16 NOTE — Progress Notes (Signed)
   Subjective:    Patient ID: [name], male    DOB: 11/30/1921, 84 y.o.   MRN: [id number]  HPI Pt here today for dressing change. Home health has been out but pts arm has been bleeding.  Patient addressed today by mouth is unable back To see he was brought in here today evaluated it appears to be bleeding into spots on his left arm.  He does not appear to be bleeding anywhere else I did discuss with the patient and with his son is to take nonstick dressings were applied adequate amount of gauze was applied by patient was encouraged to keep arm chest level regular dressing changes.  Family voices understanding.  No other intervention necessary currently.  15 minutes spent with patient.  Review of Systems     Objective:   Physical Exam        Assessment & Plan:

## 2017-07-16 NOTE — Telephone Encounter (Signed)
Called son mike to get clarification on a rx request that we received from Dorena Dew for metoprolol tartrate 50mg  one bid. It was on med list under another doctor and last sent in dec with no refills. Called to find out if he was taking it. Son states it was changed in the hospital in December from one bid to one half bid and that's why they have not needed refills for this long. Using up what he had left over. He does take one half bid and needs refills. Is it ok to refill?  Clarise Cruz from [facility] was there while I called and said she was getting ready to call for an order for xeroform. She states she she took off the non stick it was hard to come off and ripped some skin when taking it off. She wants to use the Antibiotic ointment, gauge, xeroform and then nonstick.  Sarah's number [phone].

## 2017-07-16 NOTE — Telephone Encounter (Signed)
It is fine to go ahead with the recommendation that [name] has.  #2 as for metoprolol I would recommend reducing that to a 25 mg tablet using 1 tablet twice daily #66 refills therefore they would not have to split the pills in half

## 2017-07-20 ENCOUNTER — Telehealth: Payer: Self-pay | Admitting: Family Medicine

## 2017-07-20 NOTE — Telephone Encounter (Signed)
Son(Mike) calling about labs that you wanted done this week for patient but their are no orders in chart.Patient was seen on 4/5.

## 2017-07-20 NOTE — Telephone Encounter (Signed)
[name] from [place]) contacted office due to son wanting to know if the home health aide that helps with bathing can be extended to twice a week for 2 weeks. Per Dr.Scott that is fine. Contacted [name] and informed her.

## 2017-07-21 ENCOUNTER — Telehealth: Payer: Self-pay | Admitting: Family Medicine

## 2017-07-21 ENCOUNTER — Other Ambulatory Visit: Payer: Self-pay | Admitting: Family Medicine

## 2017-07-21 DIAGNOSIS — N289 Disorder of kidney and ureter, unspecified: Secondary | ICD-10-CM

## 2017-07-21 DIAGNOSIS — E875 Hyperkalemia: Secondary | ICD-10-CM

## 2017-07-21 NOTE — Telephone Encounter (Signed)
Contacted Ben,PT, and gave verbal orders for PT.

## 2017-07-21 NOTE — Telephone Encounter (Signed)
Repeat metabolic 7 because of elevated potassium and renal insufficiency

## 2017-07-21 NOTE — Telephone Encounter (Signed)
Informed son of labs placed in epic. Son stated that he gave pt a bath yesterday and when he got out, he had 2 scratches on his right arm. Son stated one is about an inch long and there is one about two inches long. Pt has appt in the morning. Son stated he was just letting us know.

## 2017-07-21 NOTE — Telephone Encounter (Signed)
Tressia Danas, PT, is requesting verbal order for physical therapy home health 2 times a week for 4 weeks.

## 2017-07-21 NOTE — Progress Notes (Unsigned)
Met

## 2017-07-21 NOTE — Telephone Encounter (Signed)
noted 

## 2017-07-21 NOTE — Telephone Encounter (Signed)
Please go ahead and give verbal orders regarding this

## 2017-07-22 ENCOUNTER — Ambulatory Visit: Payer: Medicare Other | Admitting: Family Medicine

## 2017-07-22 ENCOUNTER — Encounter: Payer: Self-pay | Admitting: Family Medicine

## 2017-07-22 VITALS — Ht 67.0 in | Wt 132.8 lb

## 2017-07-22 DIAGNOSIS — W228XXD Striking against or struck by other objects, subsequent encounter: Secondary | ICD-10-CM

## 2017-07-22 DIAGNOSIS — M79622 Pain in left upper arm: Secondary | ICD-10-CM | POA: Diagnosis not present

## 2017-07-22 DIAGNOSIS — S61412D Laceration without foreign body of left hand, subsequent encounter: Secondary | ICD-10-CM | POA: Diagnosis not present

## 2017-07-22 DIAGNOSIS — S6990XS Unspecified injury of unspecified wrist, hand and finger(s), sequela: Secondary | ICD-10-CM

## 2017-07-22 DIAGNOSIS — L03114 Cellulitis of left upper limb: Secondary | ICD-10-CM

## 2017-07-22 DIAGNOSIS — S51012D Laceration without foreign body of left elbow, subsequent encounter: Secondary | ICD-10-CM | POA: Diagnosis not present

## 2017-07-22 DIAGNOSIS — M79642 Pain in left hand: Secondary | ICD-10-CM

## 2017-07-22 MED ORDER — MUPIROCIN 2 % EX OINT: TOPICAL_OINTMENT | CUTANEOUS | 3 refills | Status: DC

## 2017-07-22 MED ORDER — CEPHALEXIN 500 MG PO CAPS: 500.0000 mg | ORAL_CAPSULE | Freq: Four times a day (QID) | ORAL | 0 refills | Status: DC

## 2017-07-22 NOTE — Progress Notes (Signed)
   Subjective:    Patient ID: [name], male    DOB: 12-29-1921, 84 y.o.   MRN: [id number]  HPI  Patient arrives for a follow up on recent fall. This patient sustained a fairly large skin tear on his left arm and also earlier had a large skin tear on his hand The one on his hand the use stitches as well as using Steri-Strips and is having some trouble with this area denies high fever chills sweats the son relates that the one on the hand seems to be oozing a little bit no other particular troubles.  PMH benign  Review of Systems Denies high fever chills sweats denies wheezing difficulty breathing nausea vomiting diarrhea    Objective:   Physical Exam Respiratory rate normal no crackles small skin tear noted on the right arm not infected skin tear on the left elbow is healing skin tear on the forearm healing but has dead skin that needs to be debrided Has large area of dead skin on the hand with underlying infection that needs to be debrided Meticulous care was done to remove skin from the hand.  Also some from the forearm.  In addition to this Bactroban ointment placed with nonstick dressing and adequate padding and wrap this took approximately 25-30 minutes during this time I was able to help educate the patient regarding care of this area and educate the son as well as encourage avoidance of falls  25 minutes was spent with the patient.  This statement verifies that 25 minutes was indeed spent with the patient. Greater than half the time was spent in discussion, counseling and answering questions  regarding the issues that the patient came in for today as reflected in the diagnosis (s) please refer to documentation for further details. 25 minutes spent with the patient between evaluating the skin tears evaluating for infection discussing questions with the family as well as debridement and redressing personally done by myself    Assessment & Plan:  Significant debridement of the left hand  was completed.  New dressing was placed Antibiotics prescribed Antibiotic ointment recommended  Face-to-face evaluation Patient does benefit from home health doing dressing changes Recheck in 1 week

## 2017-07-23 LAB — BASIC METABOLIC PANEL
BUN/Creatinine Ratio: 23 (ref 10–24)
BUN: 49 mg/dL — AB (ref 10–36)
CALCIUM: 9 mg/dL (ref 8.6–10.2)
CO2: 27 mmol/L (ref 20–29)
CREATININE: 2.15 mg/dL — AB (ref 0.76–1.27)
Chloride: 103 mmol/L (ref 96–106)
GFR calc Af Amer: 30 mL/min/{1.73_m2} — ABNORMAL LOW (ref >59)
GFR, EST NON AFRICAN AMERICAN: 26 mL/min/{1.73_m2} — AB (ref >59)
Glucose: 126 mg/dL — ABNORMAL HIGH (ref 65–99)
Potassium: 4.3 mmol/L (ref 3.5–5.2)
SODIUM: 143 mmol/L (ref 134–144)

## 2017-07-29 ENCOUNTER — Encounter: Payer: Self-pay | Admitting: Family Medicine

## 2017-07-29 ENCOUNTER — Ambulatory Visit: Payer: Medicare Other | Admitting: Family Medicine

## 2017-07-29 VITALS — BP 130/62 | Ht 67.0 in | Wt 136.4 lb

## 2017-07-29 DIAGNOSIS — S6990XS Unspecified injury of unspecified wrist, hand and finger(s), sequela: Secondary | ICD-10-CM

## 2017-07-29 DIAGNOSIS — M79642 Pain in left hand: Secondary | ICD-10-CM

## 2017-07-29 NOTE — Patient Instructions (Signed)
Home health will be called to asked them to extend their help along with the aid  Consultation will be sent to the wound care center  Follow-up in 1 week

## 2017-07-29 NOTE — Progress Notes (Signed)
   Subjective:    Patient ID: [name], male    DOB: 10-01-1921, 84 y.o.   MRN: [id number]  HPI Pt here with son and wife for one week follow up on skin tear on left arm. Son states the cuts on upper arm look better but hand still looks infected. Home Health is coming out to help with bathing, dressing changes, and Physical Therapy is also coming out. Son states that [facility] is coming out for evaluation tomorrow to see if pt still needs care or to stop. Also, son is inquiring about walker for pt.    Review of Systems     Objective:   Physical Exam On physical exam shoulder elbow wrist normal he has a skin tear that is healing up on the right arm the one on the left arm the one near the elbow is 90% healed the one on the forearm 80% healed no sign of secondary infection the one on the hand 60% healed but has one new area right next to it no drainage noted.       Assessment & Plan:  Skin tear extensive on the left hand seems to be healing in but there is a new area that opened up.  This raises concerns.  Consultation with wound center in [facility]  Patient would benefit from ongoing help of a home health visit twice a week to do wound dressing changes plus also having a aid to help him with bathing  It should be noted that the sutures were very difficult to remove from the hand because of the matted nature and dead skin.  On the previous visit 1 week ago debridement of dead skin was completed.  I do not see any sign of secondary infection currently but I do feel the patient would benefit from wound consult.  There is even a potential that there could be a retained piece of suture in his hand but I do not see anything obvious so I do not recommend any type of surgical procedure today  15 to 20 minutes spent with the patient discussing options discussing wound care referral and home therapy greater than half time spent in answering questions Recheck patient 1 week

## 2017-08-02 ENCOUNTER — Other Ambulatory Visit: Payer: Self-pay | Admitting: *Deleted

## 2017-08-05 ENCOUNTER — Encounter: Payer: Self-pay | Admitting: Family Medicine

## 2017-08-05 ENCOUNTER — Ambulatory Visit: Payer: Medicare Other | Admitting: Family Medicine

## 2017-08-05 VITALS — BP 144/70 | Temp 98.2°F | Wt 133.0 lb

## 2017-08-05 DIAGNOSIS — M79642 Pain in left hand: Secondary | ICD-10-CM | POA: Diagnosis not present

## 2017-08-05 DIAGNOSIS — S61412D Laceration without foreign body of left hand, subsequent encounter: Secondary | ICD-10-CM

## 2017-08-05 DIAGNOSIS — R059 Cough, unspecified: Secondary | ICD-10-CM

## 2017-08-05 DIAGNOSIS — W228XXD Striking against or struck by other objects, subsequent encounter: Secondary | ICD-10-CM

## 2017-08-05 DIAGNOSIS — R05 Cough: Secondary | ICD-10-CM

## 2017-08-05 DIAGNOSIS — S6990XS Unspecified injury of unspecified wrist, hand and finger(s), sequela: Secondary | ICD-10-CM

## 2017-08-05 NOTE — Progress Notes (Signed)
   Subjective:    Patient ID: [name], male    DOB: 12-22-1921, 84 y.o.   MRN: [id number]  HPIFollow up skin tear on left hand. Has appointment with wound care on may 7th. Home health is coming out once weekly.  Family is doing wound care changes every single day.  Gradually getting better.  Chest congestion. Started one week ago.  Patient chest congestion draining over the past week coughing no phlegm no hemoptysis no fevers no sweats PMH benign has history of COPD denies wheezing or shortness of breath currently   Review of Systems  Constitutional: Negative for activity change, chills and fever.  HENT: Positive for congestion and rhinorrhea. Negative for ear pain.   Eyes: Negative for discharge.  Respiratory: Positive for cough. Negative for wheezing.   Cardiovascular: Negative for chest pain.  Gastrointestinal: Negative for nausea and vomiting.  Musculoskeletal: Negative for arthralgias.       Objective:   Physical Exam  Constitutional: He appears well-developed.  HENT:  Head: Normocephalic and atraumatic.  Mouth/Throat: Oropharynx is clear and moist. No oropharyngeal exudate.  Eyes: Right eye exhibits no discharge. Left eye exhibits no discharge.  Neck: Normal range of motion.  Cardiovascular: Normal rate, regular rhythm and normal heart sounds.  No murmur heard. Pulmonary/Chest: Effort normal and breath sounds normal. No respiratory distress. He has no wheezes. He has no rales.  Lymphadenopathy:    He has no cervical adenopathy.  Neurological: He exhibits normal muscle tone.  Skin: Skin is warm and dry.  Nursing note and vitals reviewed.   The wounds on the arms overall looks like they are healing in a new dressing was applied 15 minutes was spent with patient today discussing healthcare issues which they came. Greater than half of the discussion was answering questions and giving management guidance regarding the diagnosis for which the patient came.  Please see  diagnosis discuss       Assessment & Plan:  Viral process no need for antibiotics could be pollen related if discolored drainage fevers or other problems notify us  Wounds are gradually healing in but the area on the hand still does not have much granulation tissue I believe the patient should be seen by wound care he will follow-up here in 1 week he will do a wound care visit on May 7

## 2017-08-09 ENCOUNTER — Other Ambulatory Visit: Payer: Self-pay | Admitting: Family Medicine

## 2017-08-09 ENCOUNTER — Telehealth: Payer: Self-pay | Admitting: Family Medicine

## 2017-08-09 NOTE — Telephone Encounter (Signed)
[name] at [facility] advised per Dr Nicki Reaper- It is fine to go ahead and extend home health care as long as they see fit It is okay to use Mucinex DM If ongoing coughing or worsening issue will need to follow-up-we can certainly see him this week if they are interested-they have an appointment on Thursday but may need to be seen sooner because of the coughing [name] verbalized understanding and stated she had already advised th family that they needed to be see sooner if worse and warning signs to go to ER.

## 2017-08-09 NOTE — Telephone Encounter (Signed)
[facility] nurse, Judson Roch, just visited him and said that he has Bronchi in his right upper lobe and left and right lower lobes. No fever, oxygen levels good, just coughing.  They have Mucinex 1200 12 hr expectorant in the house.  Should he start taking that?  Also, wants to extend his home visits. Home health ends Friday but she wants to at least see him once after he visits the wound care center on May 7th.  Sarah: [phone]

## 2017-08-09 NOTE — Telephone Encounter (Signed)
It is fine to go ahead and extend home health care as long as they see fit It is okay to use Mucinex DM If ongoing coughing or worsening issue will need to follow-up-we can certainly see him this week if they are interested-they have an appointment on Thursday but may need to be seen sooner because of the coughing

## 2017-08-12 ENCOUNTER — Encounter: Payer: Self-pay | Admitting: Family Medicine

## 2017-08-12 ENCOUNTER — Ambulatory Visit: Payer: Medicare Other | Admitting: Family Medicine

## 2017-08-12 VITALS — BP 130/50 | Wt 133.0 lb

## 2017-08-12 DIAGNOSIS — S6990XS Unspecified injury of unspecified wrist, hand and finger(s), sequela: Secondary | ICD-10-CM

## 2017-08-12 DIAGNOSIS — R05 Cough: Secondary | ICD-10-CM

## 2017-08-12 DIAGNOSIS — D508 Other iron deficiency anemias: Secondary | ICD-10-CM | POA: Diagnosis not present

## 2017-08-12 DIAGNOSIS — E785 Hyperlipidemia, unspecified: Secondary | ICD-10-CM | POA: Diagnosis not present

## 2017-08-12 DIAGNOSIS — R059 Cough, unspecified: Secondary | ICD-10-CM

## 2017-08-12 NOTE — Progress Notes (Signed)
   Subjective:    Patient ID: [name], male    DOB: 26-Jul-1921, 84 y.o.   MRN: [id number]  HPI Skin tears on the arms are doing well see wound center for the one on his hand Patient is here today to follow up on his arm. He states it looks better than it has.Homecare is changing twice a week. They will be there to change tomorrow. Pt stopped on this past Tuesday. Patient feels like this is doing better Has Parkinson's stable with this Patient with COPD had cough earlier this week no significant shortness of breath or wheezing Review of Systems  Constitutional: Negative for activity change, chills and fever.  HENT: Positive for congestion and rhinorrhea. Negative for ear pain.   Eyes: Negative for discharge.  Respiratory: Positive for cough. Negative for wheezing.   Cardiovascular: Negative for chest pain.  Gastrointestinal: Negative for nausea and vomiting.  Musculoskeletal: Negative for arthralgias.       Objective:   Physical Exam  Constitutional: He appears well-developed.  HENT:  Head: Normocephalic.  Mouth/Throat: Oropharynx is clear and moist. No oropharyngeal exudate.  Neck: Normal range of motion.  Cardiovascular: Normal rate, regular rhythm and normal heart sounds.  No murmur heard. Pulmonary/Chest: Effort normal and breath sounds normal. He has no wheezes.  Lymphadenopathy:    He has no cervical adenopathy.  Neurological: He exhibits normal muscle tone.  Skin: Skin is warm and dry.  Nursing note and vitals reviewed. Time was spent replacing the dressing on the arm that hand wound actually looks much better than what it was but still healing in the other one injection looking very good        Assessment & Plan:  Wound on and doing better see the specialist later next week they will manage this issue  COPD stable  Allergies use OTC measures  Keep appointment at the end of May

## 2017-08-13 NOTE — Addendum Note (Signed)
Addended by: Karle Barr on: 08/13/2017 11:06 AM   Modules accepted: Orders

## 2017-08-13 NOTE — Progress Notes (Signed)
Son(POA) notified and verbalized understanding.

## 2017-08-13 NOTE — Progress Notes (Signed)
Labs ordered. I called and left a message asked that he r/c.

## 2017-08-17 ENCOUNTER — Encounter (HOSPITAL_BASED_OUTPATIENT_CLINIC_OR_DEPARTMENT_OTHER): Payer: Medicare Other | Attending: Internal Medicine

## 2017-08-17 DIAGNOSIS — S61412A Laceration without foreign body of left hand, initial encounter: Secondary | ICD-10-CM | POA: Diagnosis present

## 2017-08-17 DIAGNOSIS — S41112A Laceration without foreign body of left upper arm, initial encounter: Secondary | ICD-10-CM | POA: Insufficient documentation

## 2017-08-17 DIAGNOSIS — I2510 Atherosclerotic heart disease of native coronary artery without angina pectoris: Secondary | ICD-10-CM | POA: Insufficient documentation

## 2017-08-17 DIAGNOSIS — I129 Hypertensive chronic kidney disease with stage 1 through stage 4 chronic kidney disease, or unspecified chronic kidney disease: Secondary | ICD-10-CM | POA: Insufficient documentation

## 2017-08-17 DIAGNOSIS — Z923 Personal history of irradiation: Secondary | ICD-10-CM | POA: Insufficient documentation

## 2017-08-17 DIAGNOSIS — Z951 Presence of aortocoronary bypass graft: Secondary | ICD-10-CM | POA: Insufficient documentation

## 2017-08-17 DIAGNOSIS — J449 Chronic obstructive pulmonary disease, unspecified: Secondary | ICD-10-CM | POA: Diagnosis not present

## 2017-08-17 DIAGNOSIS — N183 Chronic kidney disease, stage 3 (moderate): Secondary | ICD-10-CM | POA: Diagnosis not present

## 2017-08-17 DIAGNOSIS — Z85118 Personal history of other malignant neoplasm of bronchus and lung: Secondary | ICD-10-CM | POA: Diagnosis not present

## 2017-08-17 DIAGNOSIS — I739 Peripheral vascular disease, unspecified: Secondary | ICD-10-CM | POA: Insufficient documentation

## 2017-08-17 DIAGNOSIS — G20 Parkinson's disease: Secondary | ICD-10-CM | POA: Diagnosis not present

## 2017-08-17 DIAGNOSIS — Z87891 Personal history of nicotine dependence: Secondary | ICD-10-CM | POA: Insufficient documentation

## 2017-08-17 DIAGNOSIS — W19XXXA Unspecified fall, initial encounter: Secondary | ICD-10-CM | POA: Diagnosis not present

## 2017-08-23 ENCOUNTER — Telehealth: Payer: Self-pay | Admitting: Family Medicine

## 2017-08-23 NOTE — Telephone Encounter (Signed)
Son just calling to give [name] an update on his Dad.  No return call necessary unless [name] wants to talk to him.  Dad has gone to the wound care center and they cauterized the hand injury. They are changing his dressing daily and everything looks good. Elbow has healed and forearm is almost completely healed.  They have another appt with wound care on May 21st and then with [name] May 29th.

## 2017-08-23 NOTE — Telephone Encounter (Signed)
Overall this sounds good

## 2017-08-29 ENCOUNTER — Other Ambulatory Visit: Payer: Self-pay | Admitting: Family Medicine

## 2017-08-31 DIAGNOSIS — S61412A Laceration without foreign body of left hand, initial encounter: Secondary | ICD-10-CM | POA: Diagnosis not present

## 2017-09-04 LAB — LIPID PANEL
CHOLESTEROL TOTAL: 146 mg/dL (ref 100–199)
Chol/HDL Ratio: 1.7 ratio (ref 0.0–5.0)
HDL: 86 mg/dL (ref >39)
LDL Calculated: 54 mg/dL (ref 0–99)
TRIGLYCERIDES: 30 mg/dL (ref 0–149)
VLDL Cholesterol Cal: 6 mg/dL (ref 5–40)

## 2017-09-04 LAB — CBC WITH DIFFERENTIAL/PLATELET
BASOS: 1 %
Basophils Absolute: 0 10*3/uL (ref 0.0–0.2)
EOS (ABSOLUTE): 0.2 10*3/uL (ref 0.0–0.4)
Eos: 3 %
Hematocrit: 28.4 % — ABNORMAL LOW (ref 37.5–51.0)
Hemoglobin: 9.4 g/dL — ABNORMAL LOW (ref 13.0–17.7)
IMMATURE GRANS (ABS): 0 10*3/uL (ref 0.0–0.1)
IMMATURE GRANULOCYTES: 0 %
LYMPHS: 25 %
Lymphocytes Absolute: 1.8 10*3/uL (ref 0.7–3.1)
MCH: 30.5 pg (ref 26.6–33.0)
MCHC: 33.1 g/dL (ref 31.5–35.7)
MCV: 92 fL (ref 79–97)
MONOCYTES: 8 %
MONOS ABS: 0.6 10*3/uL (ref 0.1–0.9)
Neutrophils Absolute: 4.5 10*3/uL (ref 1.4–7.0)
Neutrophils: 63 %
Platelets: 208 10*3/uL (ref 150–450)
RBC: 3.08 x10E6/uL — ABNORMAL LOW (ref 4.14–5.80)
RDW: 15 % (ref 12.3–15.4)
WBC: 7.1 10*3/uL (ref 3.4–10.8)

## 2017-09-08 ENCOUNTER — Encounter: Payer: Self-pay | Admitting: Family Medicine

## 2017-09-08 ENCOUNTER — Ambulatory Visit: Payer: Medicare Other | Admitting: Family Medicine

## 2017-09-08 VITALS — BP 128/58 | Ht 67.0 in | Wt 131.0 lb

## 2017-09-08 DIAGNOSIS — G20 Parkinson's disease: Secondary | ICD-10-CM

## 2017-09-08 DIAGNOSIS — R5383 Other fatigue: Secondary | ICD-10-CM | POA: Diagnosis not present

## 2017-09-08 DIAGNOSIS — J431 Panlobular emphysema: Secondary | ICD-10-CM | POA: Diagnosis not present

## 2017-09-08 MED ORDER — CARBIDOPA-LEVODOPA 25-100 MG PO TABS: 2.0000 | ORAL_TABLET | Freq: Three times a day (TID) | ORAL | 5 refills | Status: DC

## 2017-09-08 NOTE — Progress Notes (Signed)
   Subjective:    Patient ID: [name], male    DOB: 1921-06-17, 84 y.o.   MRN: [id number]  HPI  Patient is here today to follow up on is chronic illnesses.He is on Metoprolol 25 mg Bid. He eats healthy and does some exercise.  This is not doing as well on his medicine as he was having a fair amount of tremors family would like to try just some medication.  He was on primidone but he had significant side effects with this seems to be tolerating Sinemet at this time  Also has ongoing emphysema issues uses his inhaler seems to be breathing okay lately not having any complications recently Review of Systems  Constitutional: Negative for activity change, appetite change and fatigue.  HENT: Negative for congestion.   Respiratory: Negative for cough and shortness of breath.   Gastrointestinal: Negative for abdominal pain, nausea and vomiting.  Neurological: Negative for headaches.  Psychiatric/Behavioral: Negative for behavioral problems.       Objective:   Physical Exam  Constitutional: He appears well-nourished. No distress.  Cardiovascular: Normal rate, regular rhythm and normal heart sounds.  No murmur heard. Pulmonary/Chest: Effort normal and breath sounds normal. No respiratory distress.  Musculoskeletal: He exhibits no edema.  Lymphadenopathy:    He has no cervical adenopathy.  Neurological: He is alert.  Psychiatric: His behavior is normal.  Vitals reviewed.         Assessment & Plan:  Parkinson's-increase a dose of Sinemet 2 tablets 3 times daily follow-up in approximately 3 to 4 months-son will give [place] update in the next few weeks how this dosage is doing may need to try increase a dose maximum would be 8 tablets/day  Wounds on the skin have healed up with wound care   patient will follow-up with [facility] Later this summer for follow-up regarding lung cancer COPD stable

## 2017-09-20 ENCOUNTER — Other Ambulatory Visit: Payer: Self-pay | Admitting: Family Medicine

## 2017-09-22 ENCOUNTER — Encounter (HOSPITAL_COMMUNITY): Payer: Self-pay | Admitting: Emergency Medicine

## 2017-09-22 ENCOUNTER — Other Ambulatory Visit: Payer: Self-pay

## 2017-09-22 ENCOUNTER — Emergency Department (HOSPITAL_COMMUNITY): Payer: Medicare Other

## 2017-09-22 ENCOUNTER — Observation Stay (HOSPITAL_COMMUNITY)
Admission: EM | Admit: 2017-09-22 | Discharge: 2017-09-23 | Disposition: A | Discharge status: Home / Self Care | Payer: Medicare Other | Attending: Internal Medicine | Admitting: Internal Medicine

## 2017-09-22 DIAGNOSIS — R0602 Shortness of breath: Principal | ICD-10-CM

## 2017-09-22 DIAGNOSIS — I10 Essential (primary) hypertension: Secondary | ICD-10-CM | POA: Diagnosis not present

## 2017-09-22 DIAGNOSIS — I130 Hypertensive heart and chronic kidney disease with heart failure and stage 1 through stage 4 chronic kidney disease, or unspecified chronic kidney disease: Secondary | ICD-10-CM | POA: Diagnosis not present

## 2017-09-22 DIAGNOSIS — J441 Chronic obstructive pulmonary disease with (acute) exacerbation: Secondary | ICD-10-CM | POA: Diagnosis present

## 2017-09-22 DIAGNOSIS — Z7982 Long term (current) use of aspirin: Secondary | ICD-10-CM | POA: Insufficient documentation

## 2017-09-22 DIAGNOSIS — Z87891 Personal history of nicotine dependence: Secondary | ICD-10-CM | POA: Diagnosis not present

## 2017-09-22 DIAGNOSIS — N183 Chronic kidney disease, stage 3 unspecified: Secondary | ICD-10-CM | POA: Diagnosis present

## 2017-09-22 DIAGNOSIS — I2510 Atherosclerotic heart disease of native coronary artery without angina pectoris: Secondary | ICD-10-CM | POA: Diagnosis not present

## 2017-09-22 DIAGNOSIS — G20 Parkinson's disease: Secondary | ICD-10-CM | POA: Insufficient documentation

## 2017-09-22 DIAGNOSIS — I5032 Chronic diastolic (congestive) heart failure: Secondary | ICD-10-CM | POA: Diagnosis not present

## 2017-09-22 DIAGNOSIS — G20A1 Parkinson's disease without dyskinesia, without mention of fluctuations: Secondary | ICD-10-CM | POA: Diagnosis present

## 2017-09-22 DIAGNOSIS — Z79899 Other long term (current) drug therapy: Secondary | ICD-10-CM | POA: Insufficient documentation

## 2017-09-22 DIAGNOSIS — C3490 Malignant neoplasm of unspecified part of unspecified bronchus or lung: Secondary | ICD-10-CM | POA: Diagnosis not present

## 2017-09-22 LAB — CBC WITH DIFFERENTIAL/PLATELET
BASOS ABS: 0 10*3/uL (ref 0.0–0.1)
BASOS PCT: 1 %
EOS ABS: 0.1 10*3/uL (ref 0.0–0.7)
Eosinophils Relative: 2 %
HCT: 30.9 % — ABNORMAL LOW (ref 39.0–52.0)
Hemoglobin: 10.1 g/dL — ABNORMAL LOW (ref 13.0–17.0)
Lymphocytes Relative: 28 %
Lymphs Abs: 1.5 10*3/uL (ref 0.7–4.0)
MCH: 31.8 pg (ref 26.0–34.0)
MCHC: 32.7 g/dL (ref 30.0–36.0)
MCV: 97.2 fL (ref 78.0–100.0)
MONO ABS: 0.8 10*3/uL (ref 0.1–1.0)
Monocytes Relative: 16 %
NEUTROS PCT: 53 %
Neutro Abs: 2.8 10*3/uL (ref 1.7–7.7)
Platelets: 186 10*3/uL (ref 150–400)
RBC: 3.18 MIL/uL — ABNORMAL LOW (ref 4.22–5.81)
RDW: 15 % (ref 11.5–15.5)
WBC: 5.3 10*3/uL (ref 4.0–10.5)

## 2017-09-22 LAB — BLOOD GAS, ARTERIAL
ACID-BASE EXCESS: 1.8 mmol/L (ref 0.0–2.0)
BICARBONATE: 25.8 mmol/L (ref 20.0–28.0)
DRAWN BY: 28459
O2 SAT: 96.2 %
PATIENT TEMPERATURE: 37
PH ART: 7.382 (ref 7.350–7.450)
pCO2 arterial: 45.4 mmHg (ref 32.0–48.0)
pO2, Arterial: 87.4 mmHg (ref 83.0–108.0)

## 2017-09-22 LAB — BASIC METABOLIC PANEL
ANION GAP: 6 (ref 5–15)
BUN: 39 mg/dL — ABNORMAL HIGH (ref 6–20)
CALCIUM: 8.7 mg/dL — AB (ref 8.9–10.3)
CO2: 28 mmol/L (ref 22–32)
CREATININE: 2.04 mg/dL — AB (ref 0.61–1.24)
Chloride: 104 mmol/L (ref 101–111)
GFR, EST AFRICAN AMERICAN: 31 mL/min — AB (ref >60)
GFR, EST NON AFRICAN AMERICAN: 26 mL/min — AB (ref >60)
GLUCOSE: 98 mg/dL (ref 65–99)
Potassium: 3.7 mmol/L (ref 3.5–5.1)
Sodium: 138 mmol/L (ref 135–145)

## 2017-09-22 LAB — BRAIN NATRIURETIC PEPTIDE: B Natriuretic Peptide: 279 pg/mL — ABNORMAL HIGH (ref 0.0–100.0)

## 2017-09-22 LAB — D-DIMER, QUANTITATIVE (NOT AT ARMC): D DIMER QUANT: 1.44 ug{FEU}/mL — AB (ref 0.00–0.50)

## 2017-09-22 LAB — TROPONIN I

## 2017-09-22 MED ORDER — SODIUM CHLORIDE 0.9 % IV SOLN: 250.0000 mL | INTRAVENOUS | Status: DC | PRN

## 2017-09-22 MED ORDER — SODIUM CHLORIDE 0.9% FLUSH: 3.0000 mL | INTRAVENOUS | Status: DC | PRN

## 2017-09-22 MED ORDER — SODIUM CHLORIDE 0.9% FLUSH
3.0000 mL | Freq: Two times a day (BID) | INTRAVENOUS | Status: DC
  Administered 2017-09-23 (×2): 3 mL via INTRAVENOUS

## 2017-09-22 NOTE — H&P (Signed)
History and Physical    [name]:[id number] DOB: [birth year] DOA: 09/22/2017  PCP: Kathyrn Drown, MD  Patient coming from: Home  Chief Complaint: Shortness of breath  HPI: [name] is a 84 y.o. male with medical history significant of congestive heart failure, coronary artery disease status post CABG, chronic kidney disease baseline creatinine around 1.6, COPD, horseshoe kidney, lung cancer comes in with acute shortness of breath that has been occurring for months but was worse today.  Patient reports a nonproductive cough that is also chronic.  He denies any fevers.  He reports no nausea vomiting or diarrhea.  He reports no chest pain.  He reports no pleuritic chest pain also.  He reports no recent trauma.  He reports no recent immobilization.  He reports no recent significant illnesses.  He reports no lower extremity swelling or edema.  Patient is being referred for admission by Dr. Jeanell Sparrow in the emergency department for rule out for pulmonary emboli.  Patient shortness of breath is resolved at this time.  He is afebrile with normal O2 sats 100% room air.  Review of Systems: As per HPI otherwise 10 point review of systems negative.   Past Medical History:  Diagnosis Date  . ASCVD (arteriosclerotic cardiovascular disease)     CABG in 04/1993; and negative stress nuclear study in 08/2001  . Benign essential tremor   . CAD (coronary artery disease)   . Cancer ([place])    skin  . Cerebrovascular disease    Right carotid bruit-40-69% left internal carotid artery stenosis in 4/06; followed [facility]  . Chronic kidney disease, stage II (mild)    Creatinine-1.6 in 09/2008  . COPD (chronic obstructive pulmonary disease) ([place])   . Degenerative joint disease of knee, left   . Gout   . Horseshoe kidney   . Hyperlipidemia   . Hypertension   . Lung cancer ([place]) 2013  . Normocytic anemia   . Prediabetes   . Pulmonary disease   . Renal insufficiency   . Syncope   . Tobacco abuse, in remission      40 pack years; discontinued in 1980    Past Surgical History:  Procedure Laterality Date  . CARDIAC SURGERY    . COLONOSCOPY W/ POLYPECTOMY  1985  . CORONARY ARTERY BYPASS GRAFT  1995  . LESION EXCISION    . PILONIDAL CYST EXCISION  1948     reports that he quit smoking about 44 years ago. His smoking use included cigarettes. He has a 40.00 pack-year smoking history. He quit smokeless tobacco use about 36 years ago. His smokeless tobacco use included chew. He reports that he does not drink alcohol or use drugs.  Allergies  Allergen Reactions  . Propranolol Nausea Only  . Levaquin [Levofloxacin In D5w] Hives and Nausea And Vomiting  . Penicillins Hives and Swelling    Has patient had a PCN reaction causing immediate rash, facial/tongue/throat swelling, SOB or lightheadedness with hypotension: Yes Has patient had a PCN reaction causing severe rash involving mucus membranes or skin necrosis: No Has patient had a PCN reaction that required hospitalization No Has patient had a PCN reaction occurring within the last 10 years: No If all of the above answers are "NO", then may proceed with Cephalosporin use.   . Sulfonamide Derivatives Hives and Swelling  . Sympathomimetics Other (See Comments)  . Tape Other (See Comments)    SKIN IS VERY THIN AND TEARS EASILY; PLEASE USE AN ALTERNATIVE TO TAPE!!  Family History  Problem Relation Age of Onset  . Cancer Brother   . Heart attack Brother     Prior to Admission medications   Medication Sig Start Date End Date Taking? Authorizing Provider  albuterol (VENTOLIN HFA) 108 (90 Base) MCG/ACT inhaler INHALE 2 PUFFS INTO THE LUNGS EVERY 6 HOURS AS NEEDED FOR SHORTNESS OF BREATH 07/13/17   Kathyrn Drown, MD  Artificial Tear Solution (SOOTHE XP) SOLN Place 1-2 drops into both eyes 2 (two) times daily as needed (for dry eyes).     [provider]  aspirin EC 81 MG tablet Take 81 mg by mouth daily.    [provider]   atorvastatin (LIPITOR) 40 MG tablet TAKE 1 TABLET (40 MG TOTAL) BY MOUTH DAILY. 09/20/17   Kathyrn Drown, MD  budesonide-formoterol (SYMBICORT) 160-4.5 MCG/ACT inhaler INHALE 2 PUFF INTO THE LUNGS 2 TIMES DAILY. 08/09/17   Kathyrn Drown, MD  carbidopa-levodopa (SINEMET IR) 25-100 MG tablet Take 2 tablets by mouth 3 (three) times daily. 09/08/17   Kathyrn Drown, MD  cephALEXin (KEFLEX) 500 MG capsule Take 1 capsule (500 mg total) by mouth 4 (four) times daily. Patient not taking: Reported on 09/08/2017 07/22/17   Kathyrn Drown, MD  docusate sodium (COLACE) 100 MG capsule Take 100 mg by mouth daily as needed for mild constipation.    [provider]  metoprolol tartrate (LOPRESSOR) 25 MG tablet Take 1 tablet (25 mg total) by mouth 2 (two) times daily. 07/16/17   Kathyrn Drown, MD  Multiple Vitamins-Minerals (CENTRUM SILVER 50+MEN) TABS Take 1 tablet by mouth daily.    [provider]  mupirocin ointment (BACTROBAN) 2 % Apply bid to skin wound till healed 07/22/17   Kathyrn Drown, MD  nitroGLYCERIN (NITROSTAT) 0.4 MG SL tablet Place 1 tablet (0.4 mg total) under the tongue every 5 (five) minutes as needed for chest pain. May take up to 3 doses per episode. 04/08/17   Hongalgi, Lenis Dickinson, MD  tamsulosin (FLOMAX) 0.4 MG CAPS capsule TAKE 1 CAPSULE (0.4 MG TOTAL) BY MOUTH AT BEDTIME. 08/30/17   Luking, Elayne Snare, MD  torsemide (DEMADEX) 20 MG tablet Take 1 tablet (20 mg total) by mouth daily. 04/08/17   Modena Jansky, MD    Physical Exam: Vitals:   09/22/17 1945 09/22/17 2000 09/22/17 2015 09/22/17 2030  BP:  (!) 144/56  (!) 157/63  Pulse: (!) 58 (!) 55 (!) 58 (!) 57  Resp: 19 18 17 16   Temp:      TempSrc:      SpO2: 100% 100% 100% 100%  Weight:      Height:          Constitutional: NAD, calm, comfortable Vitals:   09/22/17 1945 09/22/17 2000 09/22/17 2015 09/22/17 2030  BP:  (!) 144/56  (!) 157/63  Pulse: (!) 58 (!) 55 (!) 58 (!) 57  Resp: 19 18 17 16   Temp:       TempSrc:      SpO2: 100% 100% 100% 100%  Weight:      Height:       Eyes: PERRL, lids and conjunctivae normal ENMT: Mucous membranes are moist. Posterior pharynx clear of any exudate or lesions.Normal dentition.  Neck: normal, supple, no masses, no thyromegaly Respiratory: clear to auscultation bilaterally, no wheezing, no crackles. Normal respiratory effort. No accessory muscle use.  Cardiovascular: Regular rate and rhythm, no murmurs / rubs / gallops. No extremity edema. 2+ pedal pulses. No carotid  bruits.  Abdomen: no tenderness, no masses palpated. No hepatosplenomegaly. Bowel sounds positive.  Musculoskeletal: no clubbing / cyanosis. No joint deformity upper and lower extremities. Good ROM, no contractures. Normal muscle tone.  Skin: no rashes, lesions, ulcers. No induration Neurologic: CN 2-12 grossly intact. Sensation intact, DTR normal. Strength 5/5 in all 4.  Psychiatric: Normal judgment and insight. Alert and oriented x 3. Normal mood.    Labs on Admission: I have personally reviewed following labs and imaging studies  CBC: Recent Labs  Lab 09/22/17 1947  WBC 5.3  NEUTROABS 2.8  HGB 10.1*  HCT 30.9*  MCV 97.2  PLT 062   Basic Metabolic Panel: Recent Labs  Lab 09/22/17 1947  NA 138  K 3.7  CL 104  CO2 28  GLUCOSE 98  BUN 39*  CREATININE 2.04*  CALCIUM 8.7*   GFR: Estimated Creatinine Clearance: 18.7 mL/min (A) (by C-G formula based on SCr of 2.04 mg/dL (H)). Liver Function Tests: No results for input(s): AST, ALT, ALKPHOS, BILITOT, PROT, ALBUMIN in the last 168 hours. No results for input(s): LIPASE, AMYLASE in the last 168 hours. No results for input(s): AMMONIA in the last 168 hours. Coagulation Profile: No results for input(s): INR, PROTIME in the last 168 hours. Cardiac Enzymes: Recent Labs  Lab 09/22/17 1947  TROPONINI <0.03   BNP (last 3 results) No results for input(s): PROBNP in the last 8760 hours. HbA1C: No results for input(s): HGBA1C  in the last 72 hours. CBG: No results for input(s): GLUCAP in the last 168 hours. Lipid Profile: No results for input(s): CHOL, HDL, LDLCALC, TRIG, CHOLHDL, LDLDIRECT in the last 72 hours. Thyroid Function Tests: No results for input(s): TSH, T4TOTAL, FREET4, T3FREE, THYROIDAB in the last 72 hours. Anemia Panel: No results for input(s): VITAMINB12, FOLATE, FERRITIN, TIBC, IRON, RETICCTPCT in the last 72 hours. Urine analysis:    Component Value Date/Time   COLORURINE YELLOW 07/12/2017 1640   APPEARANCEUR CLEAR 07/12/2017 1640   LABSPEC 1.010 07/12/2017 1640   PHURINE 7.0 07/12/2017 1640   GLUCOSEU NEGATIVE 07/12/2017 1640   HGBUR NEGATIVE 07/12/2017 1640   BILIRUBINUR NEGATIVE 07/12/2017 1640   KETONESUR NEGATIVE 07/12/2017 1640   PROTEINUR NEGATIVE 07/12/2017 1640   UROBILINOGEN 0.2 10/21/2013 1655   NITRITE NEGATIVE 07/12/2017 1640   LEUKOCYTESUR NEGATIVE 07/12/2017 1640   Sepsis Labs: !!!!!!!!!!!!!!!!!!!!!!!!!!!!!!!!!!!!!!!!!!!! @LABRCNTIP (procalcitonin:4,lacticidven:4) )No results found for this or any previous visit (from the past 240 hour(s)).   Radiological Exams on Admission: Dg Chest Port 1 View  Result Date: 09/22/2017 CLINICAL DATA:  Acute onset shortness of breath tonight. History of lung cancer, COPD. EXAM: PORTABLE CHEST 1 VIEW COMPARISON:  Chest radiograph April 06, 2017 FINDINGS: Increased lung volumes with flattened hemidiaphragms. Unchanged consolidation LEFT upper lobe with pleural thickening and volume loss. No pleural effusion or focal consolidation. Cardiac silhouette is normal size. Calcified aortic arch. Status post median sternotomy with fracture most proximal wires. No pneumothorax. Osteopenia. IMPRESSION: Stable posttreatment changes LEFT lung apex.  COPD. Aortic Atherosclerosis (ICD10-I70.0). Electronically Signed   By: Elon Alas M.D.   On: 09/22/2017 20:16    EKG: Independently reviewed.  Right bundle branch block which is old compared to old  EKGs Chest x-ray reviewed no edema or infiltrate Old chart reviewed Case discussed with Dr. Jeanell Sparrow in the ED  Assessment/Plan 84 year old male with shortness of breath of unclear etiology Principal Problem:   SOB (shortness of breath)-chest x-ray negative.  Patient with normal heart rate and O2 sats of 100% on room air.  Doubt PE but will proceed with VQ scan.  CTA contraindicated due to renal function.  We will also check BMP.  Patient does not clinically look volume overloaded at this time.  Initial troponin normal.  Observation on medical floors to an order to obtain VQ scan in the morning.  Active Problems:   Essential hypertension-stable continue home meds   CKD (chronic kidney disease), stage III (HCC)-at current baseline creatinine   Lung cancer (HCC)-noted   Diastolic CHF, chronic (HCC)-stable continue home meds   Parkinson's disease (HCC)-stable    DVT prophylaxis: SCDs Code Status: Full Family Communication: Wife and daughter Disposition Plan: Tomorrow likely Consults called: None Admission status: Observation   Laritza Vokes A MD [facility]  If 7PM-7AM, please contact night-coverage [email or web address] Password [email or web address]  09/22/2017, 9:20 PM

## 2017-09-22 NOTE — ED Notes (Signed)
Respiratory called for ABG

## 2017-09-22 NOTE — ED Provider Notes (Signed)
[facility] EMERGENCY DEPARTMENT Provider Note   CSN: [id number] Arrival date & time: 09/22/17  1924     History   Chief Complaint Chief Complaint  Patient presents with  . Shortness of Breath    HPI [name] is a 84 y.o. male.  HPI 84 year old man history of coronary artery disease, status post CABG, history of lung cancer, COPD, former smoker presents tonight with sudden onset of shortness of breath.  He states he had some chills but has not had any fever.  Sputum production has not changed.  He denies any increased coughing or wheezing.  He has not had similar episodes in the past.  He is not having any pain.  He has been eating and drinking and doing his usual daily activities.  He lives at home with his wife and son.  Primary care doctor is Dr. Sallee Lange Past Medical History:  Diagnosis Date  . ASCVD (arteriosclerotic cardiovascular disease)     CABG in 04/1993; and negative stress nuclear study in 08/2001  . Benign essential tremor   . CAD (coronary artery disease)   . [place]    skin  . Cerebrovascular disease    Right carotid bruit-40-69% left internal carotid artery stenosis in 4/06; followed [facility]  . Chronic kidney disease, stage II (mild)    Creatinine-1.6 in 09/2008  . COPD (chronic obstructive pulmonary disease) ([place])   . Degenerative joint disease of knee, left   . Gout   . Horseshoe kidney   . Hyperlipidemia   . Hypertension   . Lung cancer ([place]) 2013  . Normocytic anemia   . Prediabetes   . Pulmonary disease   . Renal insufficiency   . Syncope   . Tobacco abuse, in remission    40 pack years; discontinued in 1980    Patient Active Problem List   Diagnosis Date Noted  . Pedal edema 05/14/2017  . Parkinson's disease ([place]) 10/05/2016  . Diastolic CHF, chronic ([place]) 05/10/2015  . Dizziness   . Coronary artery disease involving coronary bypass graft of native heart without angina pectoris   . Near syncope 04/02/2015  . Diarrhea 04/02/2015  .  COPD exacerbation ([place]) 06/04/2013  . SOB (shortness of breath) 06/04/2013  . Acute respiratory failure with hypoxia ([place]) 06/04/2013  . Hemoptysis 08/10/2012  . Prediabetes 08/28/2011  . Lung cancer ([place]) 08/25/2011  . COPD (chronic obstructive pulmonary disease) ([place]) 08/25/2011  . CKD (chronic kidney disease), stage III ([place]) 12/07/2010  . Chest pain 08/18/2010  . Syncope   . Tobacco abuse, in remission   . Degenerative joint disease of knee, left   . COLONIC POLYPS 05/20/2009  . Hyperlipidemia 05/20/2009  . ANEMIA 05/20/2009  . Essential tremor 05/20/2009  . ATHEROSCLEROTIC CARDIOVASCULAR DISEASE 05/20/2009  . CEREBROVASCULAR DISEASE 05/20/2009  . Essential hypertension 01/14/2009    Past Surgical History:  Procedure Laterality Date  . CARDIAC SURGERY    . COLONOSCOPY W/ POLYPECTOMY  1985  . CORONARY ARTERY BYPASS GRAFT  1995  . LESION EXCISION    . PILONIDAL CYST EXCISION  1948        Home Medications    Prior to Admission medications   Medication Sig Start Date End Date Taking? Authorizing Provider  albuterol (VENTOLIN HFA) 108 (90 Base) MCG/ACT inhaler INHALE 2 PUFFS INTO THE LUNGS EVERY 6 HOURS AS NEEDED FOR SHORTNESS OF BREATH 07/13/17   Kathyrn Drown, MD  Artificial Tear Solution (SOOTHE XP) SOLN Place 1-2 drops into both eyes  2 (two) times daily as needed (for dry eyes).     [provider]  aspirin EC 81 MG tablet Take 81 mg by mouth daily.    [provider]  atorvastatin (LIPITOR) 40 MG tablet TAKE 1 TABLET (40 MG TOTAL) BY MOUTH DAILY. 09/20/17   Kathyrn Drown, MD  budesonide-formoterol (SYMBICORT) 160-4.5 MCG/ACT inhaler INHALE 2 PUFF INTO THE LUNGS 2 TIMES DAILY. 08/09/17   Kathyrn Drown, MD  carbidopa-levodopa (SINEMET IR) 25-100 MG tablet Take 2 tablets by mouth 3 (three) times daily. 09/08/17   Kathyrn Drown, MD  cephALEXin (KEFLEX) 500 MG capsule Take 1 capsule (500 mg total) by mouth 4 (four) times daily. Patient not taking:  Reported on 09/08/2017 07/22/17   Kathyrn Drown, MD  docusate sodium (COLACE) 100 MG capsule Take 100 mg by mouth daily as needed for mild constipation.    [provider]  metoprolol tartrate (LOPRESSOR) 25 MG tablet Take 1 tablet (25 mg total) by mouth 2 (two) times daily. 07/16/17   Kathyrn Drown, MD  Multiple Vitamins-Minerals (CENTRUM SILVER 50+MEN) TABS Take 1 tablet by mouth daily.    [provider]  mupirocin ointment (BACTROBAN) 2 % Apply bid to skin wound till healed 07/22/17   Kathyrn Drown, MD  nitroGLYCERIN (NITROSTAT) 0.4 MG SL tablet Place 1 tablet (0.4 mg total) under the tongue every 5 (five) minutes as needed for chest pain. May take up to 3 doses per episode. 04/08/17   Hongalgi, Lenis Dickinson, MD  tamsulosin (FLOMAX) 0.4 MG CAPS capsule TAKE 1 CAPSULE (0.4 MG TOTAL) BY MOUTH AT BEDTIME. 08/30/17   Luking, Elayne Snare, MD  torsemide (DEMADEX) 20 MG tablet Take 1 tablet (20 mg total) by mouth daily. 04/08/17   Modena Jansky, MD    Family History Family History  Problem Relation Age of Onset  . Cancer Brother   . Heart attack Brother     Social History Social History   Tobacco Use  . Smoking status: Former Smoker    Packs/day: 1.00    Years: 40.00    Pack years: 40.00    Types: Cigarettes    Last attempt to quit: 08/14/1973    Years since quitting: 44.1  . Smokeless tobacco: Former Systems developer    Types: [place] date: 12/05/1980  Substance Use Topics  . Alcohol use: No    Alcohol/week: 0.0 oz  . Drug use: No     Allergies   Propranolol; Levaquin [levofloxacin in d5w]; Penicillins; Sulfonamide derivatives; Sympathomimetics; and Tape   Review of Systems Review of Systems  All other systems reviewed and are negative.    Physical Exam Updated Vital Signs BP (!) 157/63   Pulse (!) 57   Temp 97.6 F (36.4 C) (Oral)   Resp 16   Ht 1.702 m (5\' 7" )   Wt 58.5 kg (129 lb)   SpO2 100%   BMI 20.20 kg/m   Physical Exam  Constitutional: He is  oriented to person, place, and time. He appears well-developed and well-nourished. He does not appear ill.  HENT:  Head: Normocephalic and atraumatic.  Mouth/Throat: Oropharynx is clear and moist.  Eyes: Pupils are equal, round, and reactive to light. EOM are normal.  Neck: Normal range of motion. Neck supple.  Cardiovascular: Normal rate and regular rhythm.  Pulmonary/Chest: Breath sounds normal. Tachypnea noted.  Abdominal: Soft. Bowel sounds are normal.  Musculoskeletal: Normal range of motion.  Right lower leg: He exhibits no edema.       Left lower leg: He exhibits no edema.  Right lower leg is slightly larger than left lower leg Scar right lower leg consistent with grafting  Neurological: He is alert and oriented to person, place, and time.  Skin: Skin is warm and dry. Capillary refill takes less than 2 seconds.  Psychiatric: He has a normal mood and affect. His behavior is normal.  Nursing note and vitals reviewed.    ED Treatments / Results  Labs (all labs ordered are listed, but only abnormal results are displayed) Labs Reviewed  BASIC METABOLIC PANEL - Abnormal; Notable for the following components:      Result Value   BUN 39 (*)    Creatinine, Ser 2.04 (*)    Calcium 8.7 (*)    GFR calc non Af Amer 26 (*)    GFR calc Af Amer 31 (*)    All other components within normal limits  CBC WITH DIFFERENTIAL/PLATELET - Abnormal; Notable for the following components:   RBC 3.18 (*)    Hemoglobin 10.1 (*)    HCT 30.9 (*)    All other components within normal limits  TROPONIN I  BLOOD GAS, ARTERIAL  D-DIMER, QUANTITATIVE (NOT AT [facility])    EKG EKG Interpretation  Date/Time:  Wednesday September 22 2017 19:41:39 EDT Ventricular Rate:  59 PR Interval:    QRS Duration: 162 QT Interval:  500 QTC Calculation: 496 R Axis:   116 Text Interpretation:  Ectopic atrial rhythm Right bundle branch block No significant change since last tracing Confirmed by Pattricia Boss [phone]) on  09/22/2017 8:00:53 PM   Radiology Dg Chest Port 1 View  Result Date: 09/22/2017 CLINICAL DATA:  Acute onset shortness of breath tonight. History of lung cancer, COPD. EXAM: PORTABLE CHEST 1 VIEW COMPARISON:  Chest radiograph April 06, 2017 FINDINGS: Increased lung volumes with flattened hemidiaphragms. Unchanged consolidation LEFT upper lobe with pleural thickening and volume loss. No pleural effusion or focal consolidation. Cardiac silhouette is normal size. Calcified aortic arch. Status post median sternotomy with fracture most proximal wires. No pneumothorax. Osteopenia. IMPRESSION: Stable posttreatment changes LEFT lung apex.  COPD. Aortic Atherosclerosis (ICD10-I70.0). Electronically Signed   By: Elon Alas M.D.   On: 09/22/2017 20:16    Procedures Procedures (including critical care time)  Medications Ordered in ED Medications - No data to display   Initial Impression / Assessment and Plan / ED Course  I have reviewed the triage vital signs and the nursing notes.  Pertinent labs & imaging results that were available during my care of the patient were reviewed by me and considered in my medical decision making (see chart for details).     Vitals:   09/22/17 2015 09/22/17 2030  BP:  (!) 157/63  Pulse: (!) 58 (!) 57  Resp: 17 16  Temp:    SpO2: 100% 69%    84 year old man with sudden onset of dyspnea with known history of coronary artery disease.  EKG without any acute ischemic changes and initial troponin is normal.  Patient is comfortable on bed and oxygen is removed.  Concern for pulmonary embolism but unable to obtain CT angiogram due to elevated baseline creatinine. Asymmetric lower extremity- possibly due to prior vein harvest d-dimer elevated at 1.44.  Plan admission for evaluation with repeat troponins, EKGs, and VQ scan in a.m. Discussed with Dr. Shanon Brow and she will see for admission.  Final Clinical Impressions(s) / ED Diagnoses  Final diagnoses:  Shortness  of breath    ED Discharge Orders    None       Pattricia Boss, MD 09/22/17 2110

## 2017-09-22 NOTE — ED Triage Notes (Signed)
Patient reports sudden onset of SOB approx 20 min ago. Denies chest pain. Occurred while sitting in a chair.

## 2017-09-23 ENCOUNTER — Other Ambulatory Visit: Payer: Self-pay

## 2017-09-23 ENCOUNTER — Observation Stay (HOSPITAL_COMMUNITY): Payer: Medicare Other

## 2017-09-23 DIAGNOSIS — I10 Essential (primary) hypertension: Secondary | ICD-10-CM | POA: Diagnosis not present

## 2017-09-23 DIAGNOSIS — G20 Parkinson's disease: Secondary | ICD-10-CM | POA: Diagnosis not present

## 2017-09-23 DIAGNOSIS — C3400 Malignant neoplasm of unspecified main bronchus: Secondary | ICD-10-CM

## 2017-09-23 DIAGNOSIS — R0602 Shortness of breath: Secondary | ICD-10-CM | POA: Diagnosis not present

## 2017-09-23 DIAGNOSIS — N183 Chronic kidney disease, stage 3 (moderate): Secondary | ICD-10-CM | POA: Diagnosis not present

## 2017-09-23 DIAGNOSIS — J441 Chronic obstructive pulmonary disease with (acute) exacerbation: Secondary | ICD-10-CM | POA: Diagnosis not present

## 2017-09-23 DIAGNOSIS — I5032 Chronic diastolic (congestive) heart failure: Secondary | ICD-10-CM | POA: Diagnosis not present

## 2017-09-23 MED ORDER — CARBIDOPA-LEVODOPA 25-100 MG PO TABS
2.0000 | ORAL_TABLET | Freq: Three times a day (TID) | ORAL | Status: DC
  Administered 2017-09-23: 2 via ORAL
  Filled 2017-09-23 (×2): qty 2

## 2017-09-23 MED ORDER — DOXYCYCLINE HYCLATE 100 MG PO CAPS: 100.0000 mg | ORAL_CAPSULE | Freq: Two times a day (BID) | ORAL | 0 refills | Status: DC

## 2017-09-23 MED ORDER — TORSEMIDE 20 MG PO TABS
20.0000 mg | ORAL_TABLET | Freq: Every day | ORAL | Status: DC
  Administered 2017-09-23: 20 mg via ORAL
  Filled 2017-09-23: qty 1

## 2017-09-23 MED ORDER — ALBUTEROL SULFATE (2.5 MG/3ML) 0.083% IN NEBU: 3.0000 mL | INHALATION_SOLUTION | RESPIRATORY_TRACT | Status: DC | PRN

## 2017-09-23 MED ORDER — GUAIFENESIN ER 600 MG PO TB12: 600.0000 mg | ORAL_TABLET | Freq: Two times a day (BID) | ORAL | 2 refills | Status: AC

## 2017-09-23 MED ORDER — ASPIRIN EC 81 MG PO TBEC
81.0000 mg | DELAYED_RELEASE_TABLET | Freq: Every day | ORAL | Status: DC
  Administered 2017-09-23: 81 mg via ORAL
  Filled 2017-09-23: qty 1

## 2017-09-23 MED ORDER — TIOTROPIUM BROMIDE MONOHYDRATE 18 MCG IN CAPS: 18.0000 ug | ORAL_CAPSULE | Freq: Every day | RESPIRATORY_TRACT | 12 refills | Status: AC

## 2017-09-23 MED ORDER — ADULT MULTIVITAMIN W/MINERALS CH
1.0000 | ORAL_TABLET | Freq: Every day | ORAL | Status: DC
  Administered 2017-09-23: 1 via ORAL
  Filled 2017-09-23: qty 1

## 2017-09-23 MED ORDER — MOMETASONE FURO-FORMOTEROL FUM 200-5 MCG/ACT IN AERO
2.0000 | INHALATION_SPRAY | Freq: Two times a day (BID) | RESPIRATORY_TRACT | Status: DC
  Filled 2017-09-23: qty 8.8

## 2017-09-23 MED ORDER — TECHNETIUM TO 99M ALBUMIN AGGREGATED
4.0000 | Freq: Once | INTRAVENOUS | Status: AC | PRN
  Administered 2017-09-23: 4.4 via INTRAVENOUS

## 2017-09-23 MED ORDER — METOPROLOL TARTRATE 25 MG PO TABS
25.0000 mg | ORAL_TABLET | Freq: Two times a day (BID) | ORAL | Status: DC
  Administered 2017-09-23: 25 mg via ORAL
  Filled 2017-09-23: qty 1

## 2017-09-23 MED ORDER — PREDNISONE 20 MG PO TABS
40.0000 mg | ORAL_TABLET | Freq: Every day | ORAL | Status: DC
  Administered 2017-09-23: 40 mg via ORAL
  Filled 2017-09-23 (×2): qty 2

## 2017-09-23 MED ORDER — TAMSULOSIN HCL 0.4 MG PO CAPS: 0.4000 mg | ORAL_CAPSULE | Freq: Every day | ORAL | Status: DC

## 2017-09-23 MED ORDER — PREDNISONE 10 MG PO TABS: ORAL_TABLET | ORAL | 0 refills | Status: DC

## 2017-09-23 MED ORDER — POLYVINYL ALCOHOL 1.4 % OP SOLN: 1.0000 [drp] | Freq: Two times a day (BID) | OPHTHALMIC | Status: DC | PRN

## 2017-09-23 MED ORDER — TIOTROPIUM BROMIDE MONOHYDRATE 18 MCG IN CAPS
18.0000 ug | ORAL_CAPSULE | Freq: Every day | RESPIRATORY_TRACT | Status: DC
  Filled 2017-09-23: qty 5

## 2017-09-23 MED ORDER — ALBUTEROL SULFATE (2.5 MG/3ML) 0.083% IN NEBU: 3.0000 mL | INHALATION_SOLUTION | RESPIRATORY_TRACT | 12 refills | Status: AC | PRN

## 2017-09-23 MED ORDER — ATORVASTATIN CALCIUM 40 MG PO TABS
40.0000 mg | ORAL_TABLET | Freq: Every day | ORAL | Status: DC
  Administered 2017-09-23: 40 mg via ORAL
  Filled 2017-09-23: qty 1

## 2017-09-23 MED ORDER — DOCUSATE SODIUM 100 MG PO CAPS: 100.0000 mg | ORAL_CAPSULE | Freq: Every day | ORAL | Status: DC | PRN

## 2017-09-23 MED ORDER — TECHNETIUM TC 99M DIETHYLENETRIAME-PENTAACETIC ACID
30.0000 | Freq: Once | INTRAVENOUS | Status: AC | PRN
  Administered 2017-09-23: 33 via RESPIRATORY_TRACT

## 2017-09-23 MED ORDER — NITROGLYCERIN 0.4 MG SL SUBL: 0.4000 mg | SUBLINGUAL_TABLET | SUBLINGUAL | Status: DC | PRN

## 2017-09-23 NOTE — Care Management Note (Signed)
Case Management Note  Patient Details  Name: [name] DOBIE MRN: [id number] Date of Birth: Mar 11, 1922  Subjective/Objective:      Pt is from home, lives with his wife and son (who are at the bedside) son assists pt with ADL's. He has cane and RW. He has parkinsons, he has shower chair, does NOT have BSC or WC. Pt has used [facility] in the past for [facility] services. Pt uses inhalers but does not have a neb machine pta.   Action/Plan: Plan for DC home with self care. Son does not feel pt would beneift from [facility] at this time. Pt and son are interested in OP PT Parkinson's program. CM will make referral. No other DC needs or concerns communicated. Pt wants to DC home today.              Expected Discharge Date:  09/23/17               Expected Discharge Plan:  Home/Self Care  In-House Referral:  NA  Discharge planning Services  CM Consult  Post Acute Care Choice:  NA Choice offered to:  NA  Status of Service:  Completed, signed off Sherald Barge, RN 09/23/2017, 1:17 PM

## 2017-09-23 NOTE — Discharge Summary (Signed)
Physician Discharge Summary  [name] OVF:[id number] DOB: 02/19/1922 DOA: 09/22/2017  PCP: Kathyrn Drown, MD  Admit date: 09/22/2017 Discharge date: 09/23/2017  Admitted From: Home  Disposition:  home  Recommendations for Outpatient Follow-up:  1. Follow up with PCP in 1-2 weeks 2. Please obtain BMP/CBC in one week   Home Health: Equipment/Devices: Home nebulizer  Discharge Condition: Stable CODE STATUS: Full code Diet recommendation: Heart healthy  Brief/Interim Summary: 84 year old male with a history of COPD, lung cancer, diastolic heart failure, presented to the emergency room with complaints of shortness of breath.  He reports that he has had worsening shortness of breath over the past few months, but discussed substantially worse overnight.  Initial imaging on arrival to the emergency room did not show any evidence of CHF or pneumonia.  He has chronic kidney disease with elevated creatinine.  BNP was only mildly elevated 200, but did not he did not have any other evidence of volume overload.  D-dimer was also mildly elevated, but VQ scan was negative for pulmonary embolus.  Patient was monitored in the hospital and had improvement of symptoms.  He is currently on room air and able to ambulate without difficulty.  He feels that her shortness of breath is better.  I suspect that a component of shortness of breath may be related to COPD.  He is already on inhaled bronchodilators and LABA.  We will add Spiriva and provide patient with a nebulizer to be used as needed.  Will give a course of prednisone this he does have mild wheeze on exam.  Patient is feeling better.  He should follow-up with his primary care physician in the next 1 to 2 weeks.  The remainder of his medical problems remained stable.  Discharge Diagnoses:  Principal Problem:   Shortness of breath Active Problems:   Essential hypertension   CKD (chronic kidney disease), stage III (HCC)   Lung cancer (HCC)   COPD  exacerbation (HCC)   Diastolic CHF, chronic (HCC)   Parkinson's disease [facility])    Discharge Instructions  Discharge Instructions    Ambulatory referral to Physical Therapy   Complete by:  As directed    Parkinsons program   Diet - low sodium heart healthy   Complete by:  As directed    Increase activity slowly   Complete by:  As directed      Allergies as of 09/23/2017      Reactions   Propranolol Nausea Only   Levaquin [levofloxacin In D5w] Hives, Nausea And Vomiting   Penicillins Hives, Swelling   Has patient had a PCN reaction causing immediate rash, facial/tongue/throat swelling, SOB or lightheadedness with hypotension: Yes Has patient had a PCN reaction causing severe rash involving mucus membranes or skin necrosis: No Has patient had a PCN reaction that required hospitalization No Has patient had a PCN reaction occurring within the last 10 years: No If all of the above answers are "NO", then may proceed with Cephalosporin use.   Sulfonamide Derivatives Hives, Swelling   Sympathomimetics Other (See Comments)   Tape Other (See Comments)   SKIN IS VERY THIN AND TEARS EASILY; PLEASE USE AN ALTERNATIVE TO TAPE!!      Medication List    STOP taking these medications   cephALEXin 500 MG capsule Commonly known as:  KEFLEX   mupirocin ointment 2 % Commonly known as:  BACTROBAN     TAKE these medications   albuterol 108 (90 Base) MCG/ACT inhaler Commonly known as:  VENTOLIN HFA INHALE 2 PUFFS INTO THE LUNGS EVERY 6 HOURS AS NEEDED FOR SHORTNESS OF BREATH What changed:  Another medication with the same name was added. Make sure you understand how and when to take each.   albuterol (2.5 MG/3ML) 0.083% nebulizer solution Commonly known as:  PROVENTIL Inhale 3 mLs into the lungs every 4 (four) hours as needed for wheezing or shortness of breath. What changed:  You were already taking a medication with the same name, and this prescription was added. Make sure you understand  how and when to take each.   aspirin EC 81 MG tablet Take 81 mg by mouth daily.   atorvastatin 40 MG tablet Commonly known as:  LIPITOR TAKE 1 TABLET (40 MG TOTAL) BY MOUTH DAILY.   budesonide-formoterol 160-4.5 MCG/ACT inhaler Commonly known as:  SYMBICORT INHALE 2 PUFF INTO THE LUNGS 2 TIMES DAILY.   carbidopa-levodopa 25-100 MG tablet Commonly known as:  SINEMET IR Take 2 tablets by mouth 3 (three) times daily.   CENTRUM SILVER 50+MEN Tabs Take 1 tablet by mouth daily.   docusate sodium 100 MG capsule Commonly known as:  COLACE Take 100 mg by mouth daily as needed for mild constipation.   doxycycline 100 MG capsule Commonly known as:  VIBRAMYCIN Take 1 capsule (100 mg total) by mouth 2 (two) times daily.   guaiFENesin 600 MG 12 hr tablet Commonly known as:  MUCINEX Take 1 tablet (600 mg total) by mouth 2 (two) times daily.   metoprolol tartrate 25 MG tablet Commonly known as:  LOPRESSOR Take 1 tablet (25 mg total) by mouth 2 (two) times daily.   nitroGLYCERIN 0.4 MG SL tablet Commonly known as:  NITROSTAT Place 1 tablet (0.4 mg total) under the tongue every 5 (five) minutes as needed for chest pain. May take up to 3 doses per episode.   predniSONE 10 MG tablet Commonly known as:  DELTASONE Take 40mg  po daily for 2 days then 30mg  daily for 2 days then 20mg  daily for 2 days then 10mg  daily for 2 days then stop   SOOTHE XP Soln Place 1-2 drops into both eyes 2 (two) times daily as needed (for dry eyes).   tamsulosin 0.4 MG Caps capsule Commonly known as:  FLOMAX TAKE 1 CAPSULE (0.4 MG TOTAL) BY MOUTH AT BEDTIME.   tiotropium 18 MCG inhalation capsule Commonly known as:  SPIRIVA Place 1 capsule (18 mcg total) into inhaler and inhale daily. Start taking on:  09/24/2017   torsemide 20 MG tablet Commonly known as:  DEMADEX Take 1 tablet (20 mg total) by mouth daily.            Durable Medical Equipment  (From admission, onward)        Start     Ordered    09/23/17 1352  For home use only DME Nebulizer machine  Once    Question:  Patient needs a nebulizer to treat with the following condition  Answer:  COPD (chronic obstructive pulmonary disease) ([place])   09/23/17 1405      Allergies  Allergen Reactions  . Propranolol Nausea Only  . Levaquin [Levofloxacin In D5w] Hives and Nausea And Vomiting  . Penicillins Hives and Swelling    Has patient had a PCN reaction causing immediate rash, facial/tongue/throat swelling, SOB or lightheadedness with hypotension: Yes Has patient had a PCN reaction causing severe rash involving mucus membranes or skin necrosis: No Has patient had a PCN reaction that required hospitalization No Has patient had a  PCN reaction occurring within the last 10 years: No If all of the above answers are "NO", then may proceed with Cephalosporin use.   . Sulfonamide Derivatives Hives and Swelling  . Sympathomimetics Other (See Comments)  . Tape Other (See Comments)    SKIN IS VERY THIN AND TEARS EASILY; PLEASE USE AN ALTERNATIVE TO TAPE!!    Consultations:     Procedures/Studies: Nm Pulmonary Perf And Vent  Result Date: 09/23/2017 CLINICAL DATA:  Shortness of breath for few months, history lung cancer, COPD, former smoker, hypertension EXAM: NUCLEAR MEDICINE VENTILATION - PERFUSION LUNG SCAN TECHNIQUE: Ventilation images were obtained in multiple projections using inhaled aerosol Tc-19m DTPA. Perfusion images were obtained in multiple projections after intravenous injection of Tc-86m-MAA. RADIOPHARMACEUTICALS:  33 mCi of Tc-56m DTPA aerosol inhalation and 4.4 mCi Tc74m-MAA IV COMPARISON:  None Radiographic correlation: Chest radiograph 09/22/2017 FINDINGS: Ventilation: Significant central airway deposition of aerosol. Poor aerosol distribution to the parenchyma of the lungs. Large areas of diminished/absent ventilation are identified throughout the LEFT lower lobe, BILATERAL upper lobes and RIGHT middle lobe. Perfusion:  Much better perfusion and ventilation throughout both lungs. Matching diminished perfusion in LEFT upper lobe. Small subsegmental perfusion defects throughout remaining lungs, smaller than accompanying ventilatory abnormalities. Chest radiograph: Severe COPD changes. Volume loss and atelectasis in LEFT upper lobe with extensive pleuroparenchymal thickening/scarring and absent long at LEFT apex. IMPRESSION: Severely impaired ventilation and mildly impaired perfusion abnormalities throughout both lungs most consistent with parenchymal lung disease/COPD. Absent ventilation and perfusion at LEFT apex corresponding to atelectasis and scarring of LEFT lung question prior resection and/or radiation therapy changes. Overall better perfusion than ventilation, representing a low probability for pulmonary embolism. Electronically Signed   By: Lavonia Dana M.D.   On: 09/23/2017 10:32   Dg Chest Port 1 View  Result Date: 09/22/2017 CLINICAL DATA:  Acute onset shortness of breath tonight. History of lung cancer, COPD. EXAM: PORTABLE CHEST 1 VIEW COMPARISON:  Chest radiograph April 06, 2017 FINDINGS: Increased lung volumes with flattened hemidiaphragms. Unchanged consolidation LEFT upper lobe with pleural thickening and volume loss. No pleural effusion or focal consolidation. Cardiac silhouette is normal size. Calcified aortic arch. Status post median sternotomy with fracture most proximal wires. No pneumothorax. Osteopenia. IMPRESSION: Stable posttreatment changes LEFT lung apex.  COPD. Aortic Atherosclerosis (ICD10-I70.0). Electronically Signed   By: Elon Alas M.D.   On: 09/22/2017 20:16      Subjective: Feeling better.  Denies shortness of breath.  Discharge Exam: Vitals:   09/23/17 0528 09/23/17 1307  BP: (!) 146/86 (!) 155/55  Pulse: 81 68  Resp: 15 18  Temp: 97.6 F (36.4 C) 97.8 F (36.6 C)  SpO2: 98% 100%   Vitals:   09/22/17 2245 09/22/17 2357 09/23/17 0528 09/23/17 1307  BP:  (!) 148/58  (!) 146/86 (!) 155/55  Pulse: 67 69 81 68  Resp: 13 16 15 18   Temp:  97.7 F (36.5 C) 97.6 F (36.4 C) 97.8 F (36.6 C)  TempSrc:  Oral Oral Oral  SpO2: 100% 100% 98% 100%  Weight:  57.8 kg (127 lb 6.8 oz)    Height:  5\' 7"  (1.702 m)      General: Pt is alert, awake, not in acute distress Cardiovascular: RRR, S1/S2 +, no rubs, no gallops Respiratory: CTA bilaterally, no wheezing, no rhonchi Abdominal: Soft, NT, ND, bowel sounds + Extremities: no edema, no cyanosis    The results of significant diagnostics from this hospitalization (including imaging, microbiology, ancillary and laboratory) are listed  below for reference.     Microbiology: No results found for this or any previous visit (from the past 240 hour(s)).   Labs: BNP (last 3 results) Recent Labs    04/06/17 2131 09/22/17 1947  BNP 383.9* 161.0*   Basic Metabolic Panel: Recent Labs  Lab 09/22/17 1947  NA 138  K 3.7  CL 104  CO2 28  GLUCOSE 98  BUN 39*  CREATININE 2.04*  CALCIUM 8.7*   Liver Function Tests: No results for input(s): AST, ALT, ALKPHOS, BILITOT, PROT, ALBUMIN in the last 168 hours. No results for input(s): LIPASE, AMYLASE in the last 168 hours. No results for input(s): AMMONIA in the last 168 hours. CBC: Recent Labs  Lab 09/22/17 1947  WBC 5.3  NEUTROABS 2.8  HGB 10.1*  HCT 30.9*  MCV 97.2  PLT 186   Cardiac Enzymes: Recent Labs  Lab 09/22/17 1947  TROPONINI <0.03   BNP: Invalid input(s): POCBNP CBG: No results for input(s): GLUCAP in the last 168 hours. D-Dimer Recent Labs    09/22/17 1947  DDIMER 1.44*   Hgb A1c No results for input(s): HGBA1C in the last 72 hours. Lipid Profile No results for input(s): CHOL, HDL, LDLCALC, TRIG, CHOLHDL, LDLDIRECT in the last 72 hours. Thyroid function studies No results for input(s): TSH, T4TOTAL, T3FREE, THYROIDAB in the last 72 hours.  Invalid input(s): FREET3 Anemia work up No results for input(s): VITAMINB12, FOLATE,  FERRITIN, TIBC, IRON, RETICCTPCT in the last 72 hours. Urinalysis    Component Value Date/Time   COLORURINE YELLOW 07/12/2017 1640   APPEARANCEUR CLEAR 07/12/2017 1640   LABSPEC 1.010 07/12/2017 1640   PHURINE 7.0 07/12/2017 1640   GLUCOSEU NEGATIVE 07/12/2017 1640   HGBUR NEGATIVE 07/12/2017 1640   BILIRUBINUR NEGATIVE 07/12/2017 1640   KETONESUR NEGATIVE 07/12/2017 1640   PROTEINUR NEGATIVE 07/12/2017 1640   UROBILINOGEN 0.2 10/21/2013 1655   NITRITE NEGATIVE 07/12/2017 1640   LEUKOCYTESUR NEGATIVE 07/12/2017 1640   Sepsis Labs Invalid input(s): PROCALCITONIN,  WBC,  LACTICIDVEN Microbiology No results found for this or any previous visit (from the past 240 hour(s)).   Time coordinating discharge: 51mins  SIGNED:   Kathie Dike, MD  [facility] 09/23/2017, 7:00 PM Pager   If 7PM-7AM, please contact night-coverage [email or web address] Password [email or web address]

## 2017-09-23 NOTE — Progress Notes (Signed)
Pt's IV catheter removed and intact. Pt's IV site clean dry and intact. Discharge instructions including medications were reviewed and discussed with patient's son. All questions were answered and no further questions at this time. Pt in stable condition and in no acute distress at time of discharge. Pt will be escorted by nurse tech.

## 2017-09-23 NOTE — Care Management Obs Status (Signed)
[place] NOTIFICATION   Patient Details  Name: [name] MRN: [id number] Date of Birth: Feb 03, 1922   Medicare Observation Status Notification Given:  Yes    Sherald Barge, RN 09/23/2017, 1:22 PM

## 2017-09-23 NOTE — Progress Notes (Signed)
Pt ambulated from his room to nursing station and back to room. Pt tolerated ambulation well and denied any discomfort or difficulty breathing during ambulation. O2 saturation before ambulation 100% at rest and at room air. Pt O2 saturation at end of ambulation on room air  99% . MD has been E-Paged.

## 2017-09-24 ENCOUNTER — Telehealth: Payer: Self-pay | Admitting: Family Medicine

## 2017-09-24 NOTE — Telephone Encounter (Signed)
Discussed with pt's son. Son verbalized understanding. And wanted appt for Thursday. Dr Nicki Reaper not in office on Thursday. Son has appts on Wednesdays and fridays. Tried to give appt for mon 24th and son wanted this coming up Monday the 17th. appt scheduled. He is not having any issues currently.

## 2017-09-24 NOTE — Telephone Encounter (Signed)
Nurse's-patient recently discharged from the hospital. Please call patient, let them know that we are aware that they were discharged from the hospital. Please schedule them to follow-up with [place] within the next 7 days. Advised the patient to bring all of their medications with him to the visit. Please inquire if they are having any acute issues currently and documented accordingly.  Please schedule for the patient to follow-up toward the end of next week Wednesday or Thursday or Friday

## 2017-09-27 ENCOUNTER — Encounter: Payer: Self-pay | Admitting: Family Medicine

## 2017-09-27 ENCOUNTER — Ambulatory Visit: Payer: Medicare Other | Admitting: Family Medicine

## 2017-09-27 VITALS — BP 122/64 | Ht 67.0 in | Wt 129.0 lb

## 2017-09-27 DIAGNOSIS — J441 Chronic obstructive pulmonary disease with (acute) exacerbation: Secondary | ICD-10-CM | POA: Diagnosis not present

## 2017-09-27 NOTE — Progress Notes (Signed)
   Subjective:    Patient ID: [name], male    DOB: 12-11-1921, 84 y.o.   MRN: [id number]  HPIFollow up hospitalization. Pt is doing better. Son Ronalee Belts has some concerns about how often to use ventolin.  We reviewed over the hospital notes including discharge summary H&P lab work x-rays we discussed how his breathing was doing he states is doing somewhat better he uses a Ventolin inhaler when necessary and only the nebulizer when the work as well he did recently started Spiriva he does take his Symbicort he does not smoke no fevers   Review of Systems  Constitutional: Negative for activity change, fatigue and fever.  HENT: Negative for congestion and rhinorrhea.   Respiratory: Negative for cough, choking and shortness of breath.   Cardiovascular: Negative for chest pain and leg swelling.  Gastrointestinal: Negative for abdominal pain, diarrhea and nausea.  Genitourinary: Negative for dysuria and hematuria.  Neurological: Negative for weakness and headaches.  Psychiatric/Behavioral: Negative for behavioral problems.       Objective:   Physical Exam  Constitutional: He appears well-nourished. No distress.  Cardiovascular: Normal rate, regular rhythm and normal heart sounds.  No murmur heard. Pulmonary/Chest: Effort normal and breath sounds normal. No respiratory distress.  Musculoskeletal: He exhibits no edema.  Lymphadenopathy:    He has no cervical adenopathy.  Neurological: He is alert.  Psychiatric: His behavior is normal.  Vitals reviewed.         Assessment & Plan:  COPD exacerbation doing better now specialist regarding lung cancer on Wednesday Has regular follow-up here later this year

## 2017-09-28 ENCOUNTER — Ambulatory Visit: Payer: Medicare Other | Admitting: Family Medicine

## 2017-09-29 ENCOUNTER — Telehealth: Payer: Self-pay | Admitting: Family Medicine

## 2017-09-29 DIAGNOSIS — K439 Ventral hernia without obstruction or gangrene: Secondary | ICD-10-CM

## 2017-09-29 NOTE — Telephone Encounter (Signed)
Referral placed and son Ronalee Belts is aware.

## 2017-09-29 NOTE — Telephone Encounter (Signed)
It would be fine to go ahead and refer patient to [facility] for further evaluation regarding hiatal hernia possibility

## 2017-09-29 NOTE — Telephone Encounter (Signed)
Radiologist at [facility] thinks pt may have a hernia & has recommended we refer pt to Dr. Laural Golden for possible EGD  NTBS or refer - please advise?

## 2017-09-30 ENCOUNTER — Encounter: Payer: Self-pay | Admitting: Family Medicine

## 2017-09-30 ENCOUNTER — Encounter (INDEPENDENT_AMBULATORY_CARE_PROVIDER_SITE_OTHER): Payer: Self-pay

## 2017-10-13 ENCOUNTER — Ambulatory Visit (INDEPENDENT_AMBULATORY_CARE_PROVIDER_SITE_OTHER): Payer: Medicare Other | Admitting: Internal Medicine

## 2017-10-13 ENCOUNTER — Encounter (INDEPENDENT_AMBULATORY_CARE_PROVIDER_SITE_OTHER): Payer: Self-pay | Admitting: Internal Medicine

## 2017-10-13 VITALS — BP 112/68 | HR 60 | Temp 98.0°F | Ht 67.0 in | Wt 128.0 lb

## 2017-10-13 DIAGNOSIS — K449 Diaphragmatic hernia without obstruction or gangrene: Secondary | ICD-10-CM | POA: Diagnosis not present

## 2017-10-13 NOTE — Progress Notes (Addendum)
Subjective:    Patient ID: [name], male    DOB: Jul 18, 1921, 84 y.o.   MRN: [id number]  HPI Referred by Dr. Sallee Lange for hiatal hernia. Hx of lung cancer and remains in remission. He is doing well. Son in room. His appetite is okay. His weight is up and down per son. Patient denies any abdominal pain. No problems with his bowels.  He has no GI complaints.   09/29/2017 CT chest without CM with 3D MIPS protocol.  Stable moderate hiatal hernia.  Last colonoscopy in 2011 Review of Systems Past Medical History:  Diagnosis Date  . ASCVD (arteriosclerotic cardiovascular disease)     CABG in 04/1993; and negative stress nuclear study in 08/2001  . Benign essential tremor   . CAD (coronary artery disease)   . Cancer ([place])    skin  . Cerebrovascular disease    Right carotid bruit-40-69% left internal carotid artery stenosis in 4/06; followed [facility]  . Chronic kidney disease, stage II (mild)    Creatinine-1.6 in 09/2008  . COPD (chronic obstructive pulmonary disease) ([place])   . Degenerative joint disease of knee, left   . Gout   . Horseshoe kidney   . Hyperlipidemia   . Hypertension   . Lung cancer ([place]) 2013  . Normocytic anemia   . Prediabetes   . Pulmonary disease   . Renal insufficiency   . Syncope   . Tobacco abuse, in remission    40 pack years; discontinued in 1980    Past Surgical History:  Procedure Laterality Date  . CARDIAC SURGERY    . COLONOSCOPY W/ POLYPECTOMY  1985  . CORONARY ARTERY BYPASS GRAFT  1995  . LESION EXCISION    . PILONIDAL CYST EXCISION  1948    Allergies  Allergen Reactions  . Propranolol Nausea Only  . Levaquin [Levofloxacin In D5w] Hives and Nausea And Vomiting  . Penicillins Hives and Swelling    Has patient had a PCN reaction causing immediate rash, facial/tongue/throat swelling, SOB or lightheadedness with hypotension: Yes Has patient had a PCN reaction causing severe rash involving mucus membranes or skin necrosis: No Has patient had  a PCN reaction that required hospitalization No Has patient had a PCN reaction occurring within the last 10 years: No If all of the above answers are "NO", then may proceed with Cephalosporin use.   . Sulfonamide Derivatives Hives and Swelling  . Sympathomimetics Other (See Comments)  . Tape Other (See Comments)    SKIN IS VERY THIN AND TEARS EASILY; PLEASE USE AN ALTERNATIVE TO TAPE!!    Current Outpatient Medications on File Prior to Visit  Medication Sig Dispense Refill  . albuterol (PROVENTIL) (2.5 MG/3ML) 0.083% nebulizer solution Inhale 3 mLs into the lungs every 4 (four) hours as needed for wheezing or shortness of breath. 75 mL 12  . albuterol (VENTOLIN HFA) 108 (90 Base) MCG/ACT inhaler INHALE 2 PUFFS INTO THE LUNGS EVERY 6 HOURS AS NEEDED FOR SHORTNESS OF BREATH 54 Inhaler 12  . aspirin EC 81 MG tablet Take 81 mg by mouth daily.    Marland Kitchen atorvastatin (LIPITOR) 40 MG tablet TAKE 1 TABLET (40 MG TOTAL) BY MOUTH DAILY. 90 tablet 1  . budesonide-formoterol (SYMBICORT) 160-4.5 MCG/ACT inhaler INHALE 2 PUFF INTO THE LUNGS 2 TIMES DAILY. 30.6 Inhaler 1  . carbidopa-levodopa (SINEMET IR) 25-100 MG tablet Take 2 tablets by mouth 3 (three) times daily. 180 tablet 5  . docusate sodium (COLACE) 100 MG capsule Take 100 mg by  mouth daily as needed for mild constipation.    Marland Kitchen guaiFENesin (MUCINEX) 600 MG 12 hr tablet Take 1 tablet (600 mg total) by mouth 2 (two) times daily. 60 tablet 2  . metoprolol tartrate (LOPRESSOR) 25 MG tablet Take 1 tablet (25 mg total) by mouth 2 (two) times daily. 60 tablet 5  . Multiple Vitamins-Minerals (CENTRUM SILVER 50+MEN) TABS Take 1 tablet by mouth daily.    . nitroGLYCERIN (NITROSTAT) 0.4 MG SL tablet Place 1 tablet (0.4 mg total) under the tongue every 5 (five) minutes as needed for chest pain. May take up to 3 doses per episode. 30 tablet 0  . tamsulosin (FLOMAX) 0.4 MG CAPS capsule TAKE 1 CAPSULE (0.4 MG TOTAL) BY MOUTH AT BEDTIME. 90 capsule 1  . tiotropium  (SPIRIVA) 18 MCG inhalation capsule Place 1 capsule (18 mcg total) into inhaler and inhale daily. 30 capsule 12  . Artificial Tear Solution (SOOTHE XP) SOLN Place 1-2 drops into both eyes 2 (two) times daily as needed (for dry eyes).     Marland Kitchen doxycycline (VIBRAMYCIN) 100 MG capsule Take 1 capsule (100 mg total) by mouth 2 (two) times daily. (Patient not taking: Reported on 10/13/2017) 10 capsule 0  . predniSONE (DELTASONE) 10 MG tablet Take 40mg  po daily for 2 days then 30mg  daily for 2 days then 20mg  daily for 2 days then 10mg  daily for 2 days then stop (Patient not taking: Reported on 10/13/2017) 20 tablet 0  . torsemide (DEMADEX) 20 MG tablet Take 1 tablet (20 mg total) by mouth daily. 30 tablet 0   No current facility-administered medications on file prior to visit.         Objective:   Physical Exam Blood pressure 112/68, pulse 60, temperature 98 F (36.7 C), weight 128 lb (58.1 kg). Alert and oriented. Skin warm and dry. Oral mucosa is moist.   . Sclera anicteric, conjunctivae is pink. Thyroid not enlarged. No cervical lymphadenopathy. Lungs clear. Heart regular rate and rhythm.  Abdomen is soft. Bowel sounds are positive. No hepatomegaly. No abdominal masses felt. No tenderness.  No edema to lower extremities.           Assessment & Plan:  Moderate Hiatal hernia without symptoms. Will discuss with Dr. Laural Golden.

## 2017-10-13 NOTE — Patient Instructions (Addendum)
Will discuss with Dr.Rehman. 

## 2017-10-18 ENCOUNTER — Ambulatory Visit: Payer: Medicare Other | Admitting: Family Medicine

## 2017-10-18 ENCOUNTER — Encounter: Payer: Self-pay | Admitting: Family Medicine

## 2017-10-18 ENCOUNTER — Telehealth: Payer: Self-pay | Admitting: Family Medicine

## 2017-10-18 VITALS — BP 128/62 | Temp 98.1°F | Ht 67.0 in | Wt 129.0 lb

## 2017-10-18 DIAGNOSIS — R05 Cough: Secondary | ICD-10-CM

## 2017-10-18 DIAGNOSIS — R059 Cough, unspecified: Secondary | ICD-10-CM

## 2017-10-18 NOTE — Telephone Encounter (Signed)
Weight is down to 125lbs. Has a little cough. Has copd and history of lung cancer. Coughing up white stuff.  Did ct chest scan at [facility]. Son states ct scan was good and was told to come back in one year Son states dr scott said if he kept losing weight he wanted to see him. He was 130lbs and in the last two weeks lost 5 lbs. Eating the same amount. Son started him on ensure one daily today. Pt's Wife is coming in today at 3:30. Son states he will be bringing both pt's with him to [facility] appt because he cant leave him at home. Son just wanted to know if Jacion needs to be seen also today.

## 2017-10-18 NOTE — Telephone Encounter (Signed)
Patients son is calling to notify [name] that he has lost 4 pounds in the last week and a half.  Please advise.

## 2017-10-18 NOTE — Telephone Encounter (Signed)
Lets go aheadf and add to schedule, do in procedure room since three adults and two frail plus me

## 2017-10-18 NOTE — Progress Notes (Signed)
   Subjective:    Patient ID: [name], male    DOB: 1921-11-14, 84 y.o.   MRN: [id number]  HPI Patient brought in today by his son states he has been losing weigh hear lately and he also has a cough.  Testing on further history has had a cough off and on for the last 6 months.  Currently under treatment for lung cancer.  Review of last ultrasound reveals the prior scan showed no current presence of the lung cancer  Review of weights shows that several weeks ago his weight was identical to today   Review of Systems No headache, no major weight loss or weight gain, no chest pain no back pain abdominal pain no change in bowel habits complete ROS otherwise negative     Objective:   Physical Exam  Alert vitals stable, NAD. Blood pressure good on repeat. HEENT normal. Lungs clear. Heart regular rate and rhythm. Pleasant no acute distress      Assessment & Plan:  Impression1 concerns regarding weight and cough discussed.  With weight stable, and lungs Crystal clear, no recent scan negative feel we can continue watchful waiting rationale discussed  Greater than 50% of this 15 minute face to face visit was spent in counseling and discussion and coordination of care regarding the above diagnosis/diagnosies

## 2017-10-18 NOTE — Telephone Encounter (Signed)
Tried to call no answer to get more info

## 2017-10-18 NOTE — Telephone Encounter (Signed)
Discussed with pt's son. Pt added to schedule for today at 4

## 2017-10-21 ENCOUNTER — Ambulatory Visit (HOSPITAL_COMMUNITY): Payer: Medicare Other | Attending: Family Medicine

## 2017-10-21 DIAGNOSIS — R29818 Other symptoms and signs involving the nervous system: Secondary | ICD-10-CM | POA: Insufficient documentation

## 2017-10-21 DIAGNOSIS — R296 Repeated falls: Secondary | ICD-10-CM | POA: Diagnosis present

## 2017-10-21 NOTE — Therapy (Signed)
[address] [place], [place], [address] Phone: [phone]   Fax:  [phone]  Physical Therapy Evaluation  Patient Details  Name: [name] MRN: [id number] Date of Birth: [birth year] Referring Provider: Sallee Lange   Encounter Date: 10/21/2017  PT End of Session - 10/21/17 1430    Visit Number  1    Number of Visits  24    Date for PT Re-Evaluation  12/22/17    Authorization Type  [employer] Medicare    Authorization Time Period  10/21/17-12/22/17 (reassessment on 11/21/17)    PT Start Time  1345    PT Stop Time  1427    PT Time Calculation (min)  42 min    Activity Tolerance  Patient tolerated treatment well    Behavior During Therapy  [facility] for tasks assessed/performed       Past Medical History:  Diagnosis Date  . ASCVD (arteriosclerotic cardiovascular disease)     CABG in 04/1993; and negative stress nuclear study in 08/2001  . Benign essential tremor   . CAD (coronary artery disease)   . Cancer ([place])    skin  . Cerebrovascular disease    Right carotid bruit-40-69% left internal carotid artery stenosis in 4/06; followed [facility]  . Chronic kidney disease, stage II (mild)    Creatinine-1.6 in 09/2008  . COPD (chronic obstructive pulmonary disease) ([place])   . Degenerative joint disease of knee, left   . Gout   . Horseshoe kidney   . Hyperlipidemia   . Hypertension   . Lung cancer ([place]) 2013  . Normocytic anemia   . Prediabetes   . Pulmonary disease   . Renal insufficiency   . Syncope   . Tobacco abuse, in remission    40 pack years; discontinued in 1980    Past Surgical History:  Procedure Laterality Date  . CARDIAC SURGERY    . COLONOSCOPY W/ POLYPECTOMY  1985  . CORONARY ARTERY BYPASS GRAFT  1995  . LESION EXCISION    . PILONIDAL CYST EXCISION  1948    There were no vitals filed for this visit.   Subjective Assessment - 10/21/17 1349    Subjective  Pt arrives with Son who helps provide history. Son  reports patient sustained 2 significant falls since February, non fractures, but multiple wounds and skin tears. Pt denies syncope or seizure, but questionable presyncope (pt unsure), and has had soem intermittent orthostatic BP issues at medical visits. Pt reports baseline dizziness when he stands too quiclly. Pt has some baseline memory deficits. Son lives with patient and pt's wife, son assists with seated bathing. Pt dresses and toilets self. No longer going to family fitness at this point since his fall.     Pertinent History  Falls history prior to this year but less injurious, son estimates 10-15 falls in past 5 years. Parkinsons has been diagnosed for about 10 years. Some tremors, some mild freezing, Son suspects carbidopa is not making much difference in symptoms.     How long can you sit comfortably?  none    How long can you stand comfortably?  none    How long can you walk comfortably?  5 minutes, with rollator (will get out and walk to church twice weekly)     Patient Stated Goals  fall less frequently.     Currently in Pain?  No/denies         [facility] PT Assessment - 10/21/17 0001  Assessment   Medical Diagnosis  Falls in Parkinsonism    Referring Provider  Scott Luking    Hand Dominance  Right    Next MD Visit  Thane Edu is regular check up    Prior Therapy  yes, multiple times,most recently last year      Balance Screen   Has the patient fallen in the past 6 months  Yes    How many times?  2    Has the patient had a decrease in activity level because of a fear of falling?   Yes    Is the patient reluctant to leave their home because of a fear of falling?   Yes      Prior Function   Level of Independence  Independent with basic ADLs son helps with bathing; SPC in home, rollator outside      Functional Tests   Functional tests  Sit to Stand;Other;Single leg stance      Single Leg Stance   Comments  Lt: 11sec, Rt: unable Narrow Stance Eyesclosed: 30+ seconds.        Sit to Stand   Comments  12.53 seconds (1 failure at 4th attempts, but able to complete 5x       ROM / Strength   AROM / PROM / Strength  Strength      Strength   Strength Assessment Site  Hip    Right/Left Hip  Right;Left    Right Hip Flexion  5/5    Right Hip External Rotation   5/5    Right Hip Internal Rotation  5/5    Left Hip Flexion  5/5    Left Hip External Rotation  5/5    Left Hip Internal Rotation  4+/5    Right Knee Flexion  4+/5    Right Knee Extension  5/5    Left Knee Flexion  5/5    Left Knee Extension  5/5      Ambulation/Gait   Ambulation Distance (Feet)  450 Feet    Assistive device  Rollator    Gait velocity  0.58m/s  0.78m/s at DC 05/18/17    Gait Comments  mild Right trendelenburg, likely chronic, with increased LLE cross-over, varus, and ankle inversion.  Reports increased left knee pain after walking                Objective measurements completed on examination: See above findings.              PT Education - 10/21/17 1429    Education provided  Yes    Education Details  educated pt's son on how patient's balance is minimally impaired, but his capcaity for falls recovery is significantly altered, largely d/t decreased response time typical of Parkinsonism and aging related changes.     Person(s) Educated  Patient;Child(ren)    Methods  Explanation;Demonstration    Comprehension  Verbalized understanding;Returned demonstration;Need further instruction       PT Short Term Goals - 10/21/17 1438      PT SHORT TERM GOAL #1   Title  At 3 weeks pt/son independent in dynamic balance/LSVT style home program to improve falls recovery.     Status  New      PT SHORT TERM GOAL #2   Title  At 4 weeks pt will demonstrate tolerance of 619ft AMB without LLE pain limitations at >0.77m/s.     Status  New      PT SHORT TERM GOAL #3   Title  At  4 weeks patient will demonstrate BBT> 42    Status  New        PT Long Term Goals - 10/21/17  1440      PT LONG TERM GOAL #1   Title  At 8 weeks pt will be independent in home based LE/UE strengthening program to improve falls recovery.     Status  New      PT LONG TERM GOAL #2   Title  At 8 weeks patient will demonstrate BBT socre> 46/56.       PT LONG TERM GOAL #3   Title  At 8 weeks pt will demonstrate tolerance of 992ft AMB c 4WW at >0.60m/s to improve ability to access the community safely.     Status  New             Plan - 10/21/17 1432    Clinical Impression Statement  Pt examination reveals mild balance deficits, midl intermittent strength deficits, chronic Left knee joint alignemnt changes, Left leg pain with sustained activity, delayed gross motor planning, and decreased capacity to recover loss of balance. Pt Now AMB with rollator out of home. Pt will benefit from skilled PT inervention to address these deficits, in order to reduce falls risk, improve falls recovery, and reduce falls associated injury.     Rehab Potential  Good    PT Frequency  -- 3x/w for 2 weeks-> 2x/w for 4 weeks, then 1x/w for 2 weeks.     PT Duration  8 weeks    PT Treatment/Interventions  ADLs/Self Care Home Management;Electrical Stimulation;Moist Heat;DME Instruction;Gait training;Stair training;Functional mobility training;Therapeutic activities;Therapeutic exercise;Balance training;Neuromuscular re-education;Patient/family education;Manual techniques;Passive range of motion;Dry needling;Taping;Energy conservation;Cognitive remediation;Cryotherapy    PT Next Visit Plan  Establish basic dynamic balance program for home that Son can assist with, establish basic chair based exercise program that patient can perform independently. Review treatment goals.     PT Home Exercise Plan  None at eval.     Consulted and Agree with Plan of Care  Family member/caregiver;Patient    Family Member Consulted  Son       Patient will benefit from skilled therapeutic intervention in order to improve the  following deficits and impairments:  Abnormal gait, Cardiopulmonary status limiting activity, Decreased activity tolerance, Decreased balance, Decreased coordination, Decreased endurance, Decreased knowledge of use of DME, Decreased strength, Difficulty walking, Hypomobility, Increased muscle spasms, Impaired perceived functional ability, Impaired flexibility, Improper body mechanics, Postural dysfunction  Visit Diagnosis: Repeated falls - Plan: PT plan of care cert/re-cert  Other symptoms and signs involving the nervous system - Plan: PT plan of care cert/re-cert     Problem List Patient Active Problem List   Diagnosis Date Noted  . Pedal edema 05/14/2017  . Parkinson's disease ([place]) 10/05/2016  . Diastolic CHF, chronic ([place]) 05/10/2015  . Dizziness   . Coronary artery disease involving coronary bypass graft of native heart without angina pectoris   . Near syncope 04/02/2015  . Diarrhea 04/02/2015  . COPD exacerbation ([place]) 06/04/2013  . Shortness of breath 06/04/2013  . Acute respiratory failure with hypoxia ([place]) 06/04/2013  . Hemoptysis 08/10/2012  . Prediabetes 08/28/2011  . Lung cancer ([place]) 08/25/2011  . COPD (chronic obstructive pulmonary disease) ([place]) 08/25/2011  . CKD (chronic kidney disease), stage III ([place]) 12/07/2010  . Chest pain 08/18/2010  . Syncope   . Tobacco abuse, in remission   . Degenerative joint disease of knee, left   . COLONIC POLYPS 05/20/2009  . Hyperlipidemia 05/20/2009  .  ANEMIA 05/20/2009  . Essential tremor 05/20/2009  . ATHEROSCLEROTIC CARDIOVASCULAR DISEASE 05/20/2009  . CEREBROVASCULAR DISEASE 05/20/2009  . Essential hypertension 01/14/2009   2:45 PM, 10/21/17 Etta Grandchild, PT, DPT Physical Therapist at [facility] [phone] (office)     Etta Grandchild 10/21/2017, 2:44 PM  [facility] [address] [place], [place], [address] Phone: [phone]   Fax:   [phone]  Name: [name] MRN: [id number] Date of Birth: June 01, 1921

## 2017-10-25 ENCOUNTER — Encounter: Payer: Self-pay | Admitting: Family Medicine

## 2017-10-25 ENCOUNTER — Telehealth: Payer: Self-pay | Admitting: Family Medicine

## 2017-10-25 ENCOUNTER — Ambulatory Visit: Payer: Medicare Other | Admitting: Family Medicine

## 2017-10-25 ENCOUNTER — Telehealth (HOSPITAL_COMMUNITY): Payer: Self-pay | Admitting: Family Medicine

## 2017-10-25 VITALS — BP 102/58 | Ht 67.0 in | Wt 128.0 lb

## 2017-10-25 DIAGNOSIS — J019 Acute sinusitis, unspecified: Secondary | ICD-10-CM | POA: Diagnosis not present

## 2017-10-25 MED ORDER — DOXYCYCLINE HYCLATE 100 MG PO CAPS: 100.0000 mg | ORAL_CAPSULE | Freq: Two times a day (BID) | ORAL | 0 refills | Status: DC

## 2017-10-25 NOTE — Telephone Encounter (Signed)
Please let the patient know you reviewed this message with me-if it is a small amount of blood it would be fine for him to follow through with [place] here at the office.  It would be wise for [place] to relook at the patient in 1 week's time sooner if the bleeding is worse. I would like for the son to give [place] an update in 48 hours how things are going.  I would not recommend other testing at this point.

## 2017-10-25 NOTE — Telephone Encounter (Signed)
Contacted son and son verbalized understanding. Son states he will call back this evening to set up a one week appt.

## 2017-10-25 NOTE — Progress Notes (Signed)
   Subjective:    Patient ID: [name], male    DOB: 26-Apr-1921, 84 y.o.   MRN: [id number]  Sinusitis  This is a new problem. Episode onset: 3-4 days. Associated symptoms include congestion and coughing. Pertinent negatives include no chills or ear pain. (Fever) Treatments tried: tylenol, mucinex.   Congestion drainage coughing sinus pressure denies any severe wheezing denies severe shortness of breath does have COPD issues   Review of Systems  Constitutional: Negative for activity change, chills and fever.  HENT: Positive for congestion and rhinorrhea. Negative for ear pain.   Eyes: Negative for discharge.  Respiratory: Positive for cough. Negative for wheezing.   Cardiovascular: Negative for chest pain.  Gastrointestinal: Negative for nausea and vomiting.  Musculoskeletal: Negative for arthralgias.       Objective:   Physical Exam  Constitutional: He appears well-developed.  HENT:  Head: Normocephalic.  Mouth/Throat: Oropharynx is clear and moist. No oropharyngeal exudate.  Neck: Normal range of motion.  Cardiovascular: Normal rate, regular rhythm and normal heart sounds.  No murmur heard. Pulmonary/Chest: Effort normal and breath sounds normal. He has no wheezes.  Lymphadenopathy:    He has no cervical adenopathy.  Neurological: He exhibits normal muscle tone.  Skin: Skin is warm and dry.  Nursing note and vitals reviewed.         Assessment & Plan:  Patient was seen today for upper respiratory illness. It is felt that the patient is dealing with sinusitis.  Antibiotics were prescribed today. Importance of compliance with medication was discussed.  Symptoms should gradually resolve over the course of the next several days. If high fevers, progressive illness, difficulty breathing, worsening condition or failure for symptoms to improve over the next several days then the patient is to follow-up.  If any emergent conditions the patient is to follow-up in the  emergency department otherwise to follow-up in the office. I do not find evidence of a COPD flareup  Please keep follow-up visit in September follow-up sooner problems

## 2017-10-25 NOTE — Telephone Encounter (Signed)
10/25/17  patient's son called to cx - said his dad was taking an antiobiotic and just not feeling well and to cancel this week only... if anything changes he will call us bac

## 2017-10-25 NOTE — Telephone Encounter (Signed)
Pt son contacted office to inform us that patient was coughing up phlegm that had a small amount of blood. Son states that it is not a huge amount; son states that the blood may be coming from a place on his tongue. Son did look at his throat and tongue with flashlight. Son asked if he should bring him in or if he should go to ER if this persist. Informed son to take patient to ED if this persisted. Son verbalized understanding

## 2017-10-26 ENCOUNTER — Ambulatory Visit (HOSPITAL_COMMUNITY): Payer: Medicare Other

## 2017-10-26 ENCOUNTER — Telehealth (HOSPITAL_COMMUNITY): Payer: Self-pay | Admitting: Physical Therapy

## 2017-10-26 NOTE — Telephone Encounter (Signed)
He has another MD apptment on this date

## 2017-10-27 ENCOUNTER — Encounter: Payer: Self-pay | Admitting: Family Medicine

## 2017-10-27 ENCOUNTER — Ambulatory Visit: Payer: Medicare Other | Admitting: Family Medicine

## 2017-10-27 ENCOUNTER — Ambulatory Visit (HOSPITAL_COMMUNITY): Payer: Medicare Other

## 2017-10-27 VITALS — Temp 98.7°F

## 2017-10-27 DIAGNOSIS — J181 Lobar pneumonia, unspecified organism: Secondary | ICD-10-CM | POA: Diagnosis not present

## 2017-10-27 DIAGNOSIS — J441 Chronic obstructive pulmonary disease with (acute) exacerbation: Secondary | ICD-10-CM | POA: Diagnosis not present

## 2017-10-27 DIAGNOSIS — J189 Pneumonia, unspecified organism: Secondary | ICD-10-CM

## 2017-10-27 MED ORDER — PREDNISONE 20 MG PO TABS: ORAL_TABLET | ORAL | 0 refills | Status: DC

## 2017-10-27 MED ORDER — CEFTRIAXONE SODIUM 1 G IJ SOLR
500.0000 mg | Freq: Once | INTRAMUSCULAR | Status: AC
  Administered 2017-10-27: 500 mg via INTRAMUSCULAR

## 2017-10-27 MED ORDER — CEFPROZIL 500 MG PO TABS
500.0000 mg | ORAL_TABLET | Freq: Two times a day (BID) | ORAL | 0 refills | Status: DC
Start: 2017-10-27 — End: 2017-10-28

## 2017-10-27 NOTE — Progress Notes (Signed)
   Subjective:    Patient ID: [name], male    DOB: 02/05/1922, 84 y.o.   MRN: [id number]  HPI Chest congestion coughing has had some low-grade fever.  Has had this going on for several weeks.  Denies wheezing difficulty breathing earlier in the week but now having some wheezing coughing shortness of breath low energy.  Eating okay but losing some weight.  Has been treated for lung cancer but a CAT scan in June did not show any recurrence and they recommended a follow-up in 1 year.  Has tried antibiotics without success has Known o COPD   Review of Systems    Significant cough congestion denies high fever chills sweats please see above relates some shortness of breath no vomiting diarrhea Objective:   Physical Exam HEENT is benign neck no masses throat is normal eardrums normal lungs Rales in the right lower base no tachypnea heart regular no murmurs extremities no edema skin warm dry       Assessment & Plan:  Early pneumonia Rocephin Change antibiotic to Cefzil Prednisone taper for COPD exacerbation Warning signs discussed in detail Hold off on x-rays lab work Follow-up in 5 days Follow-up if problems ER if worse

## 2017-10-27 NOTE — Telephone Encounter (Signed)
ERROR

## 2017-10-28 ENCOUNTER — Telehealth: Payer: Self-pay | Admitting: Family Medicine

## 2017-10-28 MED ORDER — CEFPROZIL 500 MG PO TABS: 500.0000 mg | ORAL_TABLET | Freq: Two times a day (BID) | ORAL | 0 refills | Status: DC

## 2017-10-28 NOTE — Telephone Encounter (Signed)
Pt son aware.

## 2017-10-28 NOTE — Telephone Encounter (Signed)
Son requesting that the antibiotic we ordered yesterday to be sent to [place]  ([facility] doesn't have in stock & didn't order for it to arrive today either)  Please call Ronalee Belts when done

## 2017-10-28 NOTE — Telephone Encounter (Signed)
Sent to [facility] as requested  

## 2017-10-29 ENCOUNTER — Telehealth: Payer: Self-pay | Admitting: Family Medicine

## 2017-10-29 ENCOUNTER — Other Ambulatory Visit: Payer: Self-pay

## 2017-10-29 ENCOUNTER — Inpatient Hospital Stay (HOSPITAL_COMMUNITY)
Admission: EM | Admit: 2017-10-29 | Discharge: 2017-10-30 | DRG: 194 | Disposition: A | Discharge status: Home / Self Care | Payer: Medicare Other | Attending: Internal Medicine | Admitting: Internal Medicine

## 2017-10-29 ENCOUNTER — Ambulatory Visit (HOSPITAL_COMMUNITY): Payer: Medicare Other

## 2017-10-29 ENCOUNTER — Encounter (HOSPITAL_COMMUNITY): Payer: Self-pay | Admitting: Emergency Medicine

## 2017-10-29 ENCOUNTER — Emergency Department (HOSPITAL_COMMUNITY): Payer: Medicare Other

## 2017-10-29 DIAGNOSIS — Z882 Allergy status to sulfonamides status: Secondary | ICD-10-CM

## 2017-10-29 DIAGNOSIS — R7303 Prediabetes: Secondary | ICD-10-CM | POA: Diagnosis present

## 2017-10-29 DIAGNOSIS — R109 Unspecified abdominal pain: Secondary | ICD-10-CM

## 2017-10-29 DIAGNOSIS — Z880 Allergy status to penicillin: Secondary | ICD-10-CM

## 2017-10-29 DIAGNOSIS — J181 Lobar pneumonia, unspecified organism: Principal | ICD-10-CM | POA: Diagnosis present

## 2017-10-29 DIAGNOSIS — Z888 Allergy status to other drugs, medicaments and biological substances status: Secondary | ICD-10-CM

## 2017-10-29 DIAGNOSIS — I130 Hypertensive heart and chronic kidney disease with heart failure and stage 1 through stage 4 chronic kidney disease, or unspecified chronic kidney disease: Secondary | ICD-10-CM | POA: Diagnosis present

## 2017-10-29 DIAGNOSIS — K449 Diaphragmatic hernia without obstruction or gangrene: Secondary | ICD-10-CM | POA: Diagnosis present

## 2017-10-29 DIAGNOSIS — Z951 Presence of aortocoronary bypass graft: Secondary | ICD-10-CM | POA: Diagnosis not present

## 2017-10-29 DIAGNOSIS — J440 Chronic obstructive pulmonary disease with acute lower respiratory infection: Secondary | ICD-10-CM | POA: Diagnosis present

## 2017-10-29 DIAGNOSIS — Z87891 Personal history of nicotine dependence: Secondary | ICD-10-CM | POA: Diagnosis not present

## 2017-10-29 DIAGNOSIS — Z79899 Other long term (current) drug therapy: Secondary | ICD-10-CM | POA: Diagnosis not present

## 2017-10-29 DIAGNOSIS — Q631 Lobulated, fused and horseshoe kidney: Secondary | ICD-10-CM

## 2017-10-29 DIAGNOSIS — Z7982 Long term (current) use of aspirin: Secondary | ICD-10-CM | POA: Diagnosis not present

## 2017-10-29 DIAGNOSIS — J90 Pleural effusion, not elsewhere classified: Secondary | ICD-10-CM

## 2017-10-29 DIAGNOSIS — I4581 Long QT syndrome: Secondary | ICD-10-CM | POA: Diagnosis present

## 2017-10-29 DIAGNOSIS — J189 Pneumonia, unspecified organism: Secondary | ICD-10-CM

## 2017-10-29 DIAGNOSIS — Z85118 Personal history of other malignant neoplasm of bronchus and lung: Secondary | ICD-10-CM | POA: Diagnosis not present

## 2017-10-29 DIAGNOSIS — I2510 Atherosclerotic heart disease of native coronary artery without angina pectoris: Secondary | ICD-10-CM | POA: Diagnosis present

## 2017-10-29 DIAGNOSIS — E785 Hyperlipidemia, unspecified: Secondary | ICD-10-CM | POA: Diagnosis present

## 2017-10-29 DIAGNOSIS — J431 Panlobular emphysema: Secondary | ICD-10-CM | POA: Diagnosis not present

## 2017-10-29 DIAGNOSIS — J449 Chronic obstructive pulmonary disease, unspecified: Secondary | ICD-10-CM | POA: Diagnosis present

## 2017-10-29 DIAGNOSIS — G20 Parkinson's disease: Secondary | ICD-10-CM | POA: Diagnosis present

## 2017-10-29 DIAGNOSIS — N183 Chronic kidney disease, stage 3 unspecified: Secondary | ICD-10-CM | POA: Diagnosis present

## 2017-10-29 DIAGNOSIS — D649 Anemia, unspecified: Secondary | ICD-10-CM | POA: Diagnosis present

## 2017-10-29 DIAGNOSIS — M1712 Unilateral primary osteoarthritis, left knee: Secondary | ICD-10-CM | POA: Diagnosis present

## 2017-10-29 DIAGNOSIS — I5032 Chronic diastolic (congestive) heart failure: Secondary | ICD-10-CM | POA: Diagnosis not present

## 2017-10-29 DIAGNOSIS — E861 Hypovolemia: Secondary | ICD-10-CM | POA: Diagnosis present

## 2017-10-29 DIAGNOSIS — E860 Dehydration: Secondary | ICD-10-CM | POA: Diagnosis present

## 2017-10-29 DIAGNOSIS — Z881 Allergy status to other antibiotic agents status: Secondary | ICD-10-CM

## 2017-10-29 DIAGNOSIS — G250 Essential tremor: Secondary | ICD-10-CM | POA: Diagnosis not present

## 2017-10-29 DIAGNOSIS — Z91048 Other nonmedicinal substance allergy status: Secondary | ICD-10-CM

## 2017-10-29 DIAGNOSIS — Z8249 Family history of ischemic heart disease and other diseases of the circulatory system: Secondary | ICD-10-CM

## 2017-10-29 DIAGNOSIS — Z8673 Personal history of transient ischemic attack (TIA), and cerebral infarction without residual deficits: Secondary | ICD-10-CM

## 2017-10-29 HISTORY — DX: Diaphragmatic hernia without obstruction or gangrene: K44.9

## 2017-10-29 LAB — URINALYSIS, ROUTINE W REFLEX MICROSCOPIC
BACTERIA UA: NONE SEEN
Bilirubin Urine: NEGATIVE
GLUCOSE, UA: NEGATIVE mg/dL
HGB URINE DIPSTICK: NEGATIVE
KETONES UR: NEGATIVE mg/dL
Nitrite: NEGATIVE
PROTEIN: NEGATIVE mg/dL
Specific Gravity, Urine: 1.006 (ref 1.005–1.030)
pH: 5 (ref 5.0–8.0)

## 2017-10-29 LAB — CBC WITH DIFFERENTIAL/PLATELET
Basophils Absolute: 0 10*3/uL (ref 0.0–0.1)
Basophils Relative: 0 %
EOS ABS: 0 10*3/uL (ref 0.0–0.7)
Eosinophils Relative: 0 %
HEMATOCRIT: 30.5 % — AB (ref 39.0–52.0)
HEMOGLOBIN: 9.8 g/dL — AB (ref 13.0–17.0)
LYMPHS ABS: 1.1 10*3/uL (ref 0.7–4.0)
Lymphocytes Relative: 9 %
MCH: 30.7 pg (ref 26.0–34.0)
MCHC: 32.1 g/dL (ref 30.0–36.0)
MCV: 95.6 fL (ref 78.0–100.0)
MONOS PCT: 9 %
Monocytes Absolute: 1 10*3/uL (ref 0.1–1.0)
Neutro Abs: 10 10*3/uL — ABNORMAL HIGH (ref 1.7–7.7)
Neutrophils Relative %: 82 %
Platelets: 316 10*3/uL (ref 150–400)
RBC: 3.19 MIL/uL — AB (ref 4.22–5.81)
RDW: 15.3 % (ref 11.5–15.5)
WBC: 12.1 10*3/uL — ABNORMAL HIGH (ref 4.0–10.5)

## 2017-10-29 LAB — COMPREHENSIVE METABOLIC PANEL
AST: 27 U/L (ref 15–41)
Albumin: 3.2 g/dL — ABNORMAL LOW (ref 3.5–5.0)
Alkaline Phosphatase: 82 U/L (ref 38–126)
Anion gap: 11 (ref 5–15)
BUN: 57 mg/dL — AB (ref 8–23)
CALCIUM: 9.1 mg/dL (ref 8.9–10.3)
CO2: 25 mmol/L (ref 22–32)
CREATININE: 2.2 mg/dL — AB (ref 0.61–1.24)
Chloride: 103 mmol/L (ref 98–111)
GFR calc Af Amer: 28 mL/min — ABNORMAL LOW (ref >60)
GFR calc non Af Amer: 24 mL/min — ABNORMAL LOW (ref >60)
Glucose, Bld: 124 mg/dL — ABNORMAL HIGH (ref 70–99)
POTASSIUM: 4.1 mmol/L (ref 3.5–5.1)
Sodium: 139 mmol/L (ref 135–145)
Total Bilirubin: 0.6 mg/dL (ref 0.3–1.2)
Total Protein: 6.6 g/dL (ref 6.5–8.1)

## 2017-10-29 LAB — TROPONIN I: Troponin I: <0.03 ng/mL (ref <0.03)

## 2017-10-29 LAB — LIPASE, BLOOD: LIPASE: 27 U/L (ref 11–51)

## 2017-10-29 LAB — MAGNESIUM: MAGNESIUM: 2.2 mg/dL (ref 1.7–2.4)

## 2017-10-29 MED ORDER — GUAIFENESIN ER 600 MG PO TB12
600.0000 mg | ORAL_TABLET | Freq: Two times a day (BID) | ORAL | Status: DC
  Administered 2017-10-29 – 2017-10-30 (×2): 600 mg via ORAL
  Filled 2017-10-29 (×2): qty 1

## 2017-10-29 MED ORDER — CARBIDOPA-LEVODOPA 25-100 MG PO TABS
2.0000 | ORAL_TABLET | Freq: Three times a day (TID) | ORAL | Status: DC
  Administered 2017-10-29 – 2017-10-30 (×4): 2 via ORAL
  Filled 2017-10-29 (×4): qty 2

## 2017-10-29 MED ORDER — ONDANSETRON HCL 4 MG/2ML IJ SOLN
4.0000 mg | Freq: Four times a day (QID) | INTRAMUSCULAR | Status: DC | PRN
  Administered 2017-10-30: 4 mg via INTRAVENOUS
  Filled 2017-10-29: qty 2

## 2017-10-29 MED ORDER — MOMETASONE FURO-FORMOTEROL FUM 200-5 MCG/ACT IN AERO
2.0000 | INHALATION_SPRAY | Freq: Two times a day (BID) | RESPIRATORY_TRACT | Status: DC
  Administered 2017-10-29 – 2017-10-30 (×2): 2 via RESPIRATORY_TRACT
  Filled 2017-10-29: qty 8.8

## 2017-10-29 MED ORDER — METOPROLOL TARTRATE 25 MG PO TABS
25.0000 mg | ORAL_TABLET | Freq: Two times a day (BID) | ORAL | Status: DC
  Administered 2017-10-29 – 2017-10-30 (×2): 25 mg via ORAL
  Filled 2017-10-29 (×2): qty 1

## 2017-10-29 MED ORDER — ACETAMINOPHEN 650 MG RE SUPP: 650.0000 mg | Freq: Four times a day (QID) | RECTAL | Status: DC | PRN

## 2017-10-29 MED ORDER — SODIUM CHLORIDE 0.9 % IV SOLN
1.0000 g | INTRAVENOUS | Status: DC
  Administered 2017-10-30: 1 g via INTRAVENOUS
  Filled 2017-10-29 (×3): qty 10

## 2017-10-29 MED ORDER — AZITHROMYCIN 250 MG PO TABS
500.0000 mg | ORAL_TABLET | ORAL | Status: DC
  Administered 2017-10-30: 500 mg via ORAL
  Filled 2017-10-29: qty 2

## 2017-10-29 MED ORDER — NITROGLYCERIN 0.4 MG SL SUBL: 0.4000 mg | SUBLINGUAL_TABLET | SUBLINGUAL | Status: DC | PRN

## 2017-10-29 MED ORDER — SODIUM CHLORIDE 0.9 % IV SOLN
1.0000 g | INTRAVENOUS | Status: DC
  Administered 2017-10-29: 1 g via INTRAVENOUS
  Filled 2017-10-29 (×4): qty 10

## 2017-10-29 MED ORDER — ADULT MULTIVITAMIN W/MINERALS CH
1.0000 | ORAL_TABLET | Freq: Every day | ORAL | Status: DC
  Administered 2017-10-30: 1 via ORAL
  Filled 2017-10-29: qty 1

## 2017-10-29 MED ORDER — SODIUM CHLORIDE 0.9 % IV SOLN: INTRAVENOUS | Status: DC

## 2017-10-29 MED ORDER — ENSURE ENLIVE PO LIQD
237.0000 mL | Freq: Two times a day (BID) | ORAL | Status: DC
  Administered 2017-10-30 (×2): 237 mL via ORAL

## 2017-10-29 MED ORDER — SODIUM CHLORIDE 0.9 % IV SOLN
INTRAVENOUS | Status: DC
  Administered 2017-10-29 – 2017-10-30 (×3): via INTRAVENOUS

## 2017-10-29 MED ORDER — HEPARIN SODIUM (PORCINE) 5000 UNIT/ML IJ SOLN
5000.0000 [IU] | Freq: Three times a day (TID) | INTRAMUSCULAR | Status: DC
  Administered 2017-10-29 – 2017-10-30 (×4): 5000 [IU] via SUBCUTANEOUS
  Filled 2017-10-29 (×4): qty 1

## 2017-10-29 MED ORDER — DOCUSATE SODIUM 100 MG PO CAPS: 100.0000 mg | ORAL_CAPSULE | Freq: Every day | ORAL | Status: DC | PRN

## 2017-10-29 MED ORDER — ASPIRIN EC 81 MG PO TBEC
81.0000 mg | DELAYED_RELEASE_TABLET | Freq: Every day | ORAL | Status: DC
  Administered 2017-10-30: 81 mg via ORAL
  Filled 2017-10-29: qty 1

## 2017-10-29 MED ORDER — ACETAMINOPHEN 325 MG PO TABS: 650.0000 mg | ORAL_TABLET | Freq: Four times a day (QID) | ORAL | Status: DC | PRN

## 2017-10-29 MED ORDER — ONDANSETRON HCL 4 MG/2ML IJ SOLN: 4.0000 mg | Freq: Four times a day (QID) | INTRAMUSCULAR | Status: DC | PRN

## 2017-10-29 MED ORDER — FAMOTIDINE IN NACL 20-0.9 MG/50ML-% IV SOLN
20.0000 mg | Freq: Once | INTRAVENOUS | Status: AC
  Administered 2017-10-29: 20 mg via INTRAVENOUS
  Filled 2017-10-29: qty 50

## 2017-10-29 MED ORDER — TIOTROPIUM BROMIDE MONOHYDRATE 18 MCG IN CAPS
18.0000 ug | ORAL_CAPSULE | Freq: Every day | RESPIRATORY_TRACT | Status: DC
  Administered 2017-10-30: 18 ug via RESPIRATORY_TRACT
  Filled 2017-10-29: qty 5

## 2017-10-29 MED ORDER — POLYVINYL ALCOHOL 1.4 % OP SOLN: 1.0000 [drp] | Freq: Two times a day (BID) | OPHTHALMIC | Status: DC | PRN

## 2017-10-29 MED ORDER — TAMSULOSIN HCL 0.4 MG PO CAPS
0.4000 mg | ORAL_CAPSULE | Freq: Every day | ORAL | Status: DC
  Administered 2017-10-29: 0.4 mg via ORAL
  Filled 2017-10-29: qty 1

## 2017-10-29 MED ORDER — ALBUTEROL SULFATE (2.5 MG/3ML) 0.083% IN NEBU: 3.0000 mL | INHALATION_SOLUTION | RESPIRATORY_TRACT | Status: DC | PRN

## 2017-10-29 MED ORDER — ONDANSETRON HCL 4 MG PO TABS: 4.0000 mg | ORAL_TABLET | Freq: Four times a day (QID) | ORAL | Status: DC | PRN

## 2017-10-29 MED ORDER — ATORVASTATIN CALCIUM 40 MG PO TABS
40.0000 mg | ORAL_TABLET | Freq: Every day | ORAL | Status: DC
  Administered 2017-10-29 – 2017-10-30 (×2): 40 mg via ORAL
  Filled 2017-10-29 (×2): qty 1

## 2017-10-29 MED ORDER — ALBUTEROL SULFATE HFA 108 (90 BASE) MCG/ACT IN AERS: 2.0000 | INHALATION_SPRAY | Freq: Four times a day (QID) | RESPIRATORY_TRACT | Status: DC | PRN

## 2017-10-29 MED ORDER — AZITHROMYCIN 500 MG IV SOLR
500.0000 mg | INTRAVENOUS | Status: DC
  Administered 2017-10-29: 500 mg via INTRAVENOUS
  Filled 2017-10-29 (×4): qty 500

## 2017-10-29 NOTE — ED Notes (Signed)
Family at bedside. 

## 2017-10-29 NOTE — ED Triage Notes (Signed)
Pt states that he has been having abd pain at night this has been going on for months but it is getting worse.

## 2017-10-29 NOTE — ED Notes (Signed)
Pt family reports pt urinate around 1415 and at 1434 bladder scan shows 326 ml in bladder

## 2017-10-29 NOTE — ED Provider Notes (Signed)
[facility] EMERGENCY DEPARTMENT Provider Note   CSN: [id number] Arrival date & time: 10/29/17  3235     History   Chief Complaint Chief Complaint  Patient presents with  . Abdominal Pain    HPI TRAVARUS TRUDO is a 84 y.o. male.  HPI  Pt was seen at 1100.  Per pt, c/o gradual onset and persistence of constant generalized abd "pain" for the past 2 to 3 weeks.  Has been associated with cough for the past 2 weeks. Coughing and abd pain become worse when he lays down.   Family states pt has been tx with abx for the past 1 week for cough, and a 2nd antibiotic was added 2 days ago. Denies N/V, no diarrhea, no fevers, no back pain, no rash, no CP/SOB, no black or blood in stools.      Past Medical History:  Diagnosis Date  . ASCVD (arteriosclerotic cardiovascular disease)     CABG in 04/1993; and negative stress nuclear study in 08/2001  . Benign essential tremor   . CAD (coronary artery disease)   . Cancer ([place])    skin  . Cerebrovascular disease    Right carotid bruit-40-69% left internal carotid artery stenosis in 4/06; followed [facility]  . Chronic kidney disease, stage II (mild)    Creatinine-1.6 in 09/2008  . COPD (chronic obstructive pulmonary disease) ([place])   . Degenerative joint disease of knee, left   . Gout   . Hiatal hernia   . Horseshoe kidney   . Hyperlipidemia   . Hypertension   . Lung cancer ([place]) 2013  . Normocytic anemia   . Prediabetes   . Pulmonary disease   . Renal insufficiency   . Syncope   . Tobacco abuse, in remission    40 pack years; discontinued in 1980    Patient Active Problem List   Diagnosis Date Noted  . Pedal edema 05/14/2017  . Parkinson's disease ([place]) 10/05/2016  . Diastolic CHF, chronic ([place]) 05/10/2015  . Dizziness   . Coronary artery disease involving coronary bypass graft of native heart without angina pectoris   . Near syncope 04/02/2015  . Diarrhea 04/02/2015  . COPD exacerbation ([place]) 06/04/2013  . Shortness of breath 06/04/2013    . Acute respiratory failure with hypoxia ([place]) 06/04/2013  . Hemoptysis 08/10/2012  . Prediabetes 08/28/2011  . Lung cancer ([place]) 08/25/2011  . COPD (chronic obstructive pulmonary disease) ([place]) 08/25/2011  . CKD (chronic kidney disease), stage III ([place]) 12/07/2010  . Chest pain 08/18/2010  . Syncope   . Tobacco abuse, in remission   . Degenerative joint disease of knee, left   . COLONIC POLYPS 05/20/2009  . Hyperlipidemia 05/20/2009  . ANEMIA 05/20/2009  . Essential tremor 05/20/2009  . ATHEROSCLEROTIC CARDIOVASCULAR DISEASE 05/20/2009  . CEREBROVASCULAR DISEASE 05/20/2009  . Essential hypertension 01/14/2009    Past Surgical History:  Procedure Laterality Date  . CARDIAC SURGERY    . COLONOSCOPY W/ POLYPECTOMY  1985  . CORONARY ARTERY BYPASS GRAFT  1995  . LESION EXCISION    . PILONIDAL CYST EXCISION  1948        Home Medications    Prior to Admission medications   Medication Sig Start Date End Date Taking? Authorizing Provider  albuterol (PROVENTIL) (2.5 MG/3ML) 0.083% nebulizer solution Inhale 3 mLs into the lungs every 4 (four) hours as needed for wheezing or shortness of breath. 09/23/17  Yes Kathie Dike, MD  albuterol (VENTOLIN HFA) 108 (90 Base) MCG/ACT inhaler INHALE 2 PUFFS  INTO THE LUNGS EVERY 6 HOURS AS NEEDED FOR SHORTNESS OF BREATH 07/13/17  Yes Kathyrn Drown, MD  Artificial Tear Solution (SOOTHE XP) SOLN Place 1-2 drops into both eyes 2 (two) times daily as needed (for dry eyes).    Yes [provider]  aspirin EC 81 MG tablet Take 81 mg by mouth daily.   Yes [provider]  atorvastatin (LIPITOR) 40 MG tablet TAKE 1 TABLET (40 MG TOTAL) BY MOUTH DAILY. 09/20/17  Yes Luking, Elayne Snare, MD  budesonide-formoterol (SYMBICORT) 160-4.5 MCG/ACT inhaler INHALE 2 PUFF INTO THE LUNGS 2 TIMES DAILY. 08/09/17  Yes Luking, Elayne Snare, MD  carbidopa-levodopa (SINEMET IR) 25-100 MG tablet Take 2 tablets by mouth 3 (three) times daily. 09/08/17  Yes Kathyrn Drown, MD  cefPROZIL (CEFZIL) 500 MG tablet Take 1 tablet (500 mg total) by mouth 2 (two) times daily. 10/28/17  Yes Kathyrn Drown, MD  docusate sodium (COLACE) 100 MG capsule Take 100 mg by mouth daily as needed for mild constipation.   Yes [provider]  guaiFENesin (MUCINEX) 600 MG 12 hr tablet Take 1 tablet (600 mg total) by mouth 2 (two) times daily. 09/23/17 09/23/18 Yes Kathie Dike, MD  metoprolol tartrate (LOPRESSOR) 25 MG tablet Take 1 tablet (25 mg total) by mouth 2 (two) times daily. 07/16/17  Yes Luking, Elayne Snare, MD  Multiple Vitamins-Minerals (CENTRUM SILVER 50+MEN) TABS Take 1 tablet by mouth daily.   Yes [provider]  nitroGLYCERIN (NITROSTAT) 0.4 MG SL tablet Place 1 tablet (0.4 mg total) under the tongue every 5 (five) minutes as needed for chest pain. May take up to 3 doses per episode. 04/08/17  Yes Hongalgi, Lenis Dickinson, MD  predniSONE (DELTASONE) 20 MG tablet 3qd for 2d then 2qd for 2d then 1qd for 2d 10/27/17  Yes Luking, Elayne Snare, MD  tamsulosin (FLOMAX) 0.4 MG CAPS capsule TAKE 1 CAPSULE (0.4 MG TOTAL) BY MOUTH AT BEDTIME. 08/30/17  Yes Luking, Elayne Snare, MD  tiotropium (SPIRIVA) 18 MCG inhalation capsule Place 1 capsule (18 mcg total) into inhaler and inhale daily. 09/24/17  Yes Kathie Dike, MD  torsemide (DEMADEX) 20 MG tablet Take 20 mg by mouth daily.    Yes [provider]    Family History Family History  Problem Relation Age of Onset  . Cancer Brother   . Heart attack Brother     Social History Social History   Tobacco Use  . Smoking status: Former Smoker    Packs/day: 1.00    Years: 40.00    Pack years: 40.00    Types: Cigarettes    Last attempt to quit: 08/14/1973    Years since quitting: 44.2  . Smokeless tobacco: Former Systems developer    Types: [place] date: 12/05/1980  Substance Use Topics  . Alcohol use: No    Alcohol/week: 0.0 oz  . Drug use: No     Allergies   Propranolol; Levaquin [levofloxacin in d5w];  Penicillins; Sulfonamide derivatives; Sympathomimetics; and Tape   Review of Systems Review of Systems ROS: Statement: All systems negative except as marked or noted in the HPI; Constitutional: Negative for fever and chills. ; ; Eyes: Negative for eye pain, redness and discharge. ; ; ENMT: Negative for ear pain, hoarseness, nasal congestion, sinus pressure and sore throat. ; ; Cardiovascular: Negative for chest pain, palpitations, diaphoresis, dyspnea and peripheral edema. ; ; Respiratory: +cough. Negative for wheezing and stridor. ; ; Gastrointestinal: +abd pain. Negative for nausea,  vomiting, diarrhea, blood in stool, hematemesis, jaundice and rectal bleeding. . ; ; Genitourinary: Negative for dysuria, flank pain and hematuria. ; ; Musculoskeletal: Negative for back pain and neck pain. Negative for swelling and trauma.; ; Skin: Negative for pruritus, rash, abrasions, blisters, bruising and skin lesion.; ; Neuro: Negative for headache, lightheadedness and neck stiffness. Negative for weakness, altered level of consciousness, altered mental status, extremity weakness, paresthesias, involuntary movement, seizure and syncope.       Physical Exam Updated Vital Signs BP (!) 148/77 (BP Location: Left Arm)   Pulse 77   Temp 97.8 F (36.6 C) (Oral)   Resp 20   Ht 5\' 7"  (1.702 m)   Wt 58.1 kg (128 lb)   SpO2 98%   BMI 20.05 kg/m   Physical Exam 1005: Physical examination:  Nursing notes reviewed; Vital signs and O2 SAT reviewed;  Constitutional: Well developed, Well nourished, Well hydrated, In no acute distress; Head:  Normocephalic, atraumatic; Eyes: EOMI, PERRL, No scleral icterus; ENMT: Mouth and pharynx normal, Mucous membranes moist; Neck: Supple, Full range of motion, No lymphadenopathy; Cardiovascular: Regular rate and rhythm, No gallop; Respiratory: Breath sounds clear & equal bilaterally, No wheezes.  Speaking full sentences with ease, Normal respiratory effort/excursion; Chest: Nontender,  Movement normal; Abdomen: Soft, +diffuse tenderness to palp. No rebound or guarding. Nondistended, Normal bowel sounds; Genitourinary: No CVA tenderness; Extremities: Peripheral pulses normal, No tenderness, No edema, No calf edema or asymmetry.; Neuro: AA&Ox3, Major CN grossly intact.  Speech clear. No gross focal motor or sensory deficits in extremities.; Skin: Color normal, Warm, Dry.   ED Treatments / Results  Labs (all labs ordered are listed, but only abnormal results are displayed)   EKG EKG Interpretation  Date/Time:  Friday October 29 2017 10:51:15 EDT Ventricular Rate:  68 PR Interval:    QRS Duration: 160 QT Interval:  495 QTC Calculation: 527 R Axis:   157 Text Interpretation:  Ectopic atrial rhythm Right bundle branch block When compared with ECG of 09/22/2017 No significant change was found Confirmed by Francine Graven [phone]) on 10/29/2017 11:34:03 AM   Radiology   Procedures Procedures (including critical care time)  Medications Ordered in ED Medications - No data to display   Initial Impression / Assessment and Plan / ED Course  I have reviewed the triage vital signs and the nursing notes.  Pertinent labs & imaging results that were available during my care of the patient were reviewed by me and considered in my medical decision making (see chart for details).  MDM Reviewed: previous chart, nursing note and vitals Reviewed previous: labs and ECG Interpretation: labs, ECG, x-ray and CT scan   Results for orders placed or performed during the hospital encounter of 10/29/17  Comprehensive metabolic panel  Result Value Ref Range   Sodium 139 135 - 145 mmol/L   Potassium 4.1 3.5 - 5.1 mmol/L   Chloride 103 98 - 111 mmol/L   CO2 25 22 - 32 mmol/L   Glucose, Bld 124 (H) 70 - 99 mg/dL   BUN 57 (H) 8 - 23 mg/dL   Creatinine, Ser 2.20 (H) 0.61 - 1.24 mg/dL   Calcium 9.1 8.9 - 10.3 mg/dL   Total Protein 6.6 6.5 - 8.1 g/dL   Albumin 3.2 (L) 3.5 - 5.0 g/dL    AST 27 15 - 41 U/L   ALT <5 0 - 44 U/L   Alkaline Phosphatase 82 38 - 126 U/L   Total Bilirubin 0.6 0.3 - 1.2 mg/dL  GFR calc non Af Amer 24 (L) >60 mL/min   GFR calc Af Amer 28 (L) >60 mL/min   Anion gap 11 5 - 15  Lipase, blood  Result Value Ref Range   Lipase 27 11 - 51 U/L  Troponin I  Result Value Ref Range   Troponin I <0.03 <0.03 ng/mL  CBC with Differential  Result Value Ref Range   WBC 12.1 (H) 4.0 - 10.5 K/uL   RBC 3.19 (L) 4.22 - 5.81 MIL/uL   Hemoglobin 9.8 (L) 13.0 - 17.0 g/dL   HCT 30.5 (L) 39.0 - 52.0 %   MCV 95.6 78.0 - 100.0 fL   MCH 30.7 26.0 - 34.0 pg   MCHC 32.1 30.0 - 36.0 g/dL   RDW 15.3 11.5 - 15.5 %   Platelets 316 150 - 400 K/uL   Neutrophils Relative % 82 %   Neutro Abs 10.0 (H) 1.7 - 7.7 K/uL   Lymphocytes Relative 9 %   Lymphs Abs 1.1 0.7 - 4.0 K/uL   Monocytes Relative 9 %   Monocytes Absolute 1.0 0.1 - 1.0 K/uL   Eosinophils Relative 0 %   Eosinophils Absolute 0.0 0.0 - 0.7 K/uL   Basophils Relative 0 %   Basophils Absolute 0.0 0.0 - 0.1 K/uL  Urinalysis, Routine w reflex microscopic  Result Value Ref Range   Color, Urine STRAW (A) YELLOW   APPearance CLEAR CLEAR   Specific Gravity, Urine 1.006 1.005 - 1.030   pH 5.0 5.0 - 8.0   Glucose, UA NEGATIVE NEGATIVE mg/dL   Hgb urine dipstick NEGATIVE NEGATIVE   Bilirubin Urine NEGATIVE NEGATIVE   Ketones, ur NEGATIVE NEGATIVE mg/dL   Protein, ur NEGATIVE NEGATIVE mg/dL   Nitrite NEGATIVE NEGATIVE   Leukocytes, UA SMALL (A) NEGATIVE   RBC / HPF 0-5 0 - 5 RBC/hpf   WBC, UA 6-10 0 - 5 WBC/hpf   Bacteria, UA NONE SEEN NONE SEEN   Mucus PRESENT    Hyaline Casts, UA PRESENT    Ct Abdomen Pelvis Wo Contrast Result Date: 10/29/2017 CLINICAL DATA:  Pt states that he has been having abd pain at night this has been going on for at least three weeks but it is getting worse. Patient does have diarrhea. No hx of surgery, trauma. No IV contrast due to kidney function. EXAM: CT ABDOMEN AND PELVIS WITHOUT  CONTRAST TECHNIQUE: Multidetector CT imaging of the abdomen and pelvis was performed following the standard protocol without IV contrast. COMPARISON:  05/14/2016 FINDINGS: Lower chest: Small right pleural effusion. Patchy coarse airspace opacities in the visualized right lower lobe posterior and lateral basal segments. Coronary and Aortic Atherosclerosis (ICD10-170.0). sternotomy wires. Hepatobiliary: Subcentimeter probable partially calcified stones in the dependent aspect of the nondilated gallbladder. No focal liver lesion or biliary ductal dilatation evident. Pancreas: Unremarkable. No pancreatic ductal dilatation or surrounding inflammatory changes. Spleen: Normal in size without focal abnormality. Adrenals/Urinary Tract: Normal adrenal glands. Horseshoe kidney. No urolithiasis or hydronephrosis. Extensive renal arterial calcifications. Marked distention of the urinary bladder. Stomach/Bowel: Hiatal hernia. Stomach is nondilated. Small bowel decompressed. Moderate fecal material in the proximal colon and rectum, without dilatation. Multiple sigmoid diverticula without significant adjacent inflammatory/edematous change or abscess. Vascular/Lymphatic: Heavy atheromatous calcifications in the aorta and iliac arterial systems without aneurysm. No abdominal or pelvic adenopathy. Reproductive: Prostatic enlargement with scattered calcifications. Other: No ascites.  No free air. Musculoskeletal: Spondylitic changes L2-S1. Bilateral hip DJD. Negative for fracture or worrisome bone lesion. IMPRESSION: 1. New patchy airspace infiltrates in  the right lower lobe suggesting pneumonia, with small effusion. 2. No acute abdominal process. 3. Additional nonacute as detailed above, including: Cholelithiasis Horseshoe kidney Extensive aortoiliac atherosclerosis Diverticulosis Prostatic enlargement with marked distention of the urinary bladder Lumbar and hip degenerative change Electronically Signed   By: Lucrezia Europe M.D.   On:  10/29/2017 12:30   Dg Chest 2 View Result Date: 10/29/2017 CLINICAL DATA:  Shortness of breath and abdominal pain. History of lung cancer. History of COPD. EXAM: CHEST - 2 VIEW COMPARISON:  Chest x-rays dated 09/22/2017, 04/06/2017 and 04/10/2016 FINDINGS: There is a new ill-defined area of infiltrate at the right lung base superimposed on scarring and emphysematous changes. Heart size is normal.  No increased pulmonary vascularity. Extensive scarring in the left hemithorax with superior retraction of left hilar structures and chronic pleural thickening in the left apex. The appearance of the left lung is unchanged. CABG. Extensive aortic atherosclerosis. No acute bone abnormality. Chronic blunting of the costophrenic angles consistent with emphysema. IMPRESSION: 1. Acute patchy infiltrate at the right lung base superimposed on emphysema and parenchymal scarring. 2. Chronic stable scarring in the left hemithorax. 3.  Aortic Atherosclerosis (ICD10-I70.0). Electronically Signed   By: Lorriane Shire M.D.   On: 10/29/2017 11:33    1405:  Pt with multiple drug allergies but has taken a dose rocephin at PMD office several days ago without adverse rxn. IV abx ordered for CAP.  H/H per baseline. BUN/Cr mildly elevated from baseline. Dx and testing d/w pt and family.  Questions answered.  Verb understanding, agreeable to admit.   T/C returned from [facility] Dr. Clementeen Graham, case discussed, including:  HPI, pertinent PM/SHx, VS/PE, dx testing, ED course and treatment:  Agreeable to admit.    Final Clinical Impressions(s) / ED Diagnoses   Final diagnoses:  None    ED Discharge Orders    None       Francine Graven, DO 10/31/17 1811

## 2017-10-29 NOTE — Telephone Encounter (Signed)
Pt son contacted office to see if patient could be seen today or what he needed to do. Pt son explained that patient has been having stomach pain. Pt son stated that pt told him it had been hurting for 2-3 weeks. Pt son stated that patient was not able to sleep last night; pt son stated that he almost went to the ER. No fever, diarrhea at this time. Pt son advised to take patient to ER to get evaluated. Pt son verbalized understanding

## 2017-10-29 NOTE — ED Notes (Signed)
Bladder Scan: 359mL

## 2017-10-29 NOTE — H&P (Signed)
TRH H&P   Patient Demographics:    [name], is a 84 y.o. male  MRN: [id number]   DOB - [birth year]  Admit Date - 10/29/2017  Outpatient Primary MD for the patient is Kathyrn Drown, MD  Referring MD: Dr. Thurnell Garbe  Outpatient Specialists: None  Patient coming from: Home  Chief Complaint  Patient presents with  . Abdominal Pain      HPI:    [name]  is a 84 y.o. male, with history of lung cancer in remission, COPD not on home O2, hypertension, CAD with history of CABG in the 90s, history of CVA, Parkinson's disease and chronic kidney disease stage III (baseline creatinine around 2) who presented to the ED with ongoing cough with whitish phlegm and lower abdominal discomfort.  Patient was seen by his PCP on 7/15 with chest congestion for several weeks and abdominal discomfort.  He was placed on doxycycline and sent home.  He was then seen 2 days later for persistent symptoms and was switched to Cefzil with concern for early pneumonia along with prednisone taper for COPD exacerbation. Since symptoms did not improve and had persistent dull lower abdominal pain his son brought him to the ED.  Patient denies any fevers, chills, headache, dizziness, nausea, vomiting, chest pain, palpitations, orthopnea, PND, dysuria, diarrhea, increased weakness of his extremities.  He reports having dull aching lower abdominal discomfort mainly during the night and sometimes during the day, not associated with meals or with bowel or urinary symptoms.  He denies any hemoptysis.  Her appetite and no weight loss.  He had a fall at home in March of this year after which he has been using a cane or a walker.  In the ED vitals were stable except for mildly elevated blood pressure.  Blood work showed BC of 12 point 1K, hemoglobin of 9.8, BUN of 57 and creatinine of 2.20 (mildly elevated from baseline of 2).   Glucose of 124. Chest x-ray showed acute patchy infiltrate of the right lung base.  CT of the abdomen and pelvis without contrast for his abdominal pain again showed right lower lobe infiltrate with some effusion, no acute abdominal findings.  Has cholecystolithiasis but without cholecystitis. Patient given IV Rocephin and azithromycin and hospitalist consulted for admission to medical floor.     Review of systems:    In addition to the HPI above,  No Fever-chills, No Headache, No changes with Vision or hearing, No problems swallowing food or Liquids, Productive cough, no chest pain or shortness of breath Abdominal pain,  no Nausea or vomiting, Bowel movements are regular, No Blood in stool or Urine, No dysuria, No new skin rashes or bruises, No new joints pains-aches,  No new weakness, tingling, numbness in any extremity, No recent weight gain or loss, No polyuria, polydypsia or polyphagia, No significant Mental Stressors.   With Past History  of the following :    Past Medical History:  Diagnosis Date  . ASCVD (arteriosclerotic cardiovascular disease)     CABG in 04/1993; and negative stress nuclear study in 08/2001  . Benign essential tremor   . CAD (coronary artery disease)   . Cancer ([place])    skin  . Cerebrovascular disease    Right carotid bruit-40-69% left internal carotid artery stenosis in 4/06; followed [facility]  . Chronic kidney disease, stage II (mild)    Creatinine-1.6 in 09/2008  . COPD (chronic obstructive pulmonary disease) ([place])   . Degenerative joint disease of knee, left   . Gout   . Hiatal hernia   . Horseshoe kidney   . Hyperlipidemia   . Hypertension   . Lung cancer ([place]) 2013  . Normocytic anemia   . Prediabetes   . Pulmonary disease   . Renal insufficiency   . Syncope   . Tobacco abuse, in remission    40 pack years; discontinued in 1980      Past Surgical History:  Procedure Laterality Date  . CARDIAC SURGERY    . COLONOSCOPY W/ POLYPECTOMY   1985  . CORONARY ARTERY BYPASS GRAFT  1995  . LESION EXCISION    . PILONIDAL CYST EXCISION  1948      Social History:     Social History   Tobacco Use  . Smoking status: Former Smoker    Packs/day: 1.00    Years: 40.00    Pack years: 40.00    Types: Cigarettes    Last attempt to quit: 08/14/1973    Years since quitting: 44.2  . Smokeless tobacco: Former Systems developer    Types: [place] date: 12/05/1980  Substance Use Topics  . Alcohol use: No    Alcohol/week: 0.0 oz     Lives -home with wife  Mobility -uses cane at home and walker when going outside     Family History :     Family History  Problem Relation Age of Onset  . Cancer Brother   . Heart attack Brother       Home Medications:   Prior to Admission medications   Medication Sig Start Date End Date Taking? Authorizing Provider  albuterol (PROVENTIL) (2.5 MG/3ML) 0.083% nebulizer solution Inhale 3 mLs into the lungs every 4 (four) hours as needed for wheezing or shortness of breath. 09/23/17  Yes Kathie Dike, MD  albuterol (VENTOLIN HFA) 108 (90 Base) MCG/ACT inhaler INHALE 2 PUFFS INTO THE LUNGS EVERY 6 HOURS AS NEEDED FOR SHORTNESS OF BREATH 07/13/17  Yes Kathyrn Drown, MD  Artificial Tear Solution (SOOTHE XP) SOLN Place 1-2 drops into both eyes 2 (two) times daily as needed (for dry eyes).    Yes [provider]  aspirin EC 81 MG tablet Take 81 mg by mouth daily.   Yes [provider]  atorvastatin (LIPITOR) 40 MG tablet TAKE 1 TABLET (40 MG TOTAL) BY MOUTH DAILY. 09/20/17  Yes Luking, Elayne Snare, MD  budesonide-formoterol (SYMBICORT) 160-4.5 MCG/ACT inhaler INHALE 2 PUFF INTO THE LUNGS 2 TIMES DAILY. 08/09/17  Yes Luking, Elayne Snare, MD  carbidopa-levodopa (SINEMET IR) 25-100 MG tablet Take 2 tablets by mouth 3 (three) times daily. 09/08/17  Yes Kathyrn Drown, MD  cefPROZIL (CEFZIL) 500 MG tablet Take 1 tablet (500 mg total) by mouth 2 (two) times daily. 10/28/17  Yes Kathyrn Drown, MD  docusate  sodium (COLACE) 100 MG capsule Take 100 mg by mouth daily as needed  for mild constipation.   Yes [provider]  guaiFENesin (MUCINEX) 600 MG 12 hr tablet Take 1 tablet (600 mg total) by mouth 2 (two) times daily. 09/23/17 09/23/18 Yes Kathie Dike, MD  metoprolol tartrate (LOPRESSOR) 25 MG tablet Take 1 tablet (25 mg total) by mouth 2 (two) times daily. 07/16/17  Yes Luking, Elayne Snare, MD  Multiple Vitamins-Minerals (CENTRUM SILVER 50+MEN) TABS Take 1 tablet by mouth daily.   Yes [provider]  nitroGLYCERIN (NITROSTAT) 0.4 MG SL tablet Place 1 tablet (0.4 mg total) under the tongue every 5 (five) minutes as needed for chest pain. May take up to 3 doses per episode. 04/08/17  Yes Hongalgi, Lenis Dickinson, MD  predniSONE (DELTASONE) 20 MG tablet 3qd for 2d then 2qd for 2d then 1qd for 2d 10/27/17  Yes Luking, Elayne Snare, MD  tamsulosin (FLOMAX) 0.4 MG CAPS capsule TAKE 1 CAPSULE (0.4 MG TOTAL) BY MOUTH AT BEDTIME. 08/30/17  Yes Luking, Elayne Snare, MD  tiotropium (SPIRIVA) 18 MCG inhalation capsule Place 1 capsule (18 mcg total) into inhaler and inhale daily. 09/24/17  Yes Kathie Dike, MD  torsemide (DEMADEX) 20 MG tablet Take 20 mg by mouth daily.    Yes [provider]     Allergies:     Allergies  Allergen Reactions  . Propranolol Nausea Only  . Levaquin [Levofloxacin In D5w] Hives and Nausea And Vomiting  . Penicillins Hives and Swelling    Has patient had a PCN reaction causing immediate rash, facial/tongue/throat swelling, SOB or lightheadedness with hypotension: Yes Has patient had a PCN reaction causing severe rash involving mucus membranes or skin necrosis: No Has patient had a PCN reaction that required hospitalization No Has patient had a PCN reaction occurring within the last 10 years: No If all of the above answers are "NO", then may proceed with Cephalosporin use.   . Sulfonamide Derivatives Hives and Swelling  . Sympathomimetics Other (See Comments)  . Tape  Other (See Comments)    SKIN IS VERY THIN AND TEARS EASILY; PLEASE USE AN ALTERNATIVE TO TAPE!!     Physical Exam:   Vitals  Blood pressure (!) 156/69, pulse 70, temperature 97.8 F (36.6 C), temperature source Oral, resp. rate 18, height 5\' 7"  (1.702 m), weight 58.1 kg (128 lb), SpO2 99 %.   General: Elderly pleasant male lying in bed not in distress HEENT: Pupils reactive bilaterally, EOMI, no pallor, no icterus, moist oral mucosa, supple neck Chest: Diminished breath sounds over right lung base, no added sounds CVs: Normal S1-S2, no murmurs rub or gallop GI: Soft, nondistended, nontender, bowel sounds present Muscular skeletal: Warm, no edema CNS: Alert oriented, resting tremors      Data Review:    CBC Recent Labs  Lab 10/29/17 1112  WBC 12.1*  HGB 9.8*  HCT 30.5*  PLT 316  MCV 95.6  MCH 30.7  MCHC 32.1  RDW 15.3  LYMPHSABS 1.1  MONOABS 1.0  EOSABS 0.0  BASOSABS 0.0   ------------------------------------------------------------------------------------------------------------------  Chemistries  Recent Labs  Lab 10/29/17 1112  NA 139  K 4.1  CL 103  CO2 25  GLUCOSE 124*  BUN 57*  CREATININE 2.20*  CALCIUM 9.1  AST 27  ALT <5  ALKPHOS 82  BILITOT 0.6   ------------------------------------------------------------------------------------------------------------------ estimated creatinine clearance is 17.2 mL/min (A) (by C-G formula based on SCr of 2.2 mg/dL (H)). ------------------------------------------------------------------------------------------------------------------ No results for input(s): TSH, T4TOTAL, T3FREE, THYROIDAB in the last 72 hours.  Invalid input(s): FREET3  Coagulation profile  No results for input(s): INR, PROTIME in the last 168 hours. ------------------------------------------------------------------------------------------------------------------- No results for input(s): DDIMER in the last 72  hours. -------------------------------------------------------------------------------------------------------------------  Cardiac Enzymes Recent Labs  Lab 10/29/17 1112  TROPONINI <0.03   ------------------------------------------------------------------------------------------------------------------    Component Value Date/Time   BNP 279.0 (H) 09/22/2017 1947   BNP 171.6 04/01/2009     ---------------------------------------------------------------------------------------------------------------  Urinalysis    Component Value Date/Time   COLORURINE STRAW (A) 10/29/2017 1038   APPEARANCEUR CLEAR 10/29/2017 1038   LABSPEC 1.006 10/29/2017 1038   PHURINE 5.0 10/29/2017 1038   GLUCOSEU NEGATIVE 10/29/2017 1038   HGBUR NEGATIVE 10/29/2017 1038   BILIRUBINUR NEGATIVE 10/29/2017 1038   KETONESUR NEGATIVE 10/29/2017 1038   PROTEINUR NEGATIVE 10/29/2017 1038   UROBILINOGEN 0.2 10/21/2013 1655   NITRITE NEGATIVE 10/29/2017 1038   LEUKOCYTESUR SMALL (A) 10/29/2017 1038    ----------------------------------------------------------------------------------------------------------------   Imaging Results:    Ct Abdomen Pelvis Wo Contrast  Result Date: 10/29/2017 CLINICAL DATA:  Pt states that he has been having abd pain at night this has been going on for at least three weeks but it is getting worse. Patient does have diarrhea. No hx of surgery, trauma. No IV contrast due to kidney function. EXAM: CT ABDOMEN AND PELVIS WITHOUT CONTRAST TECHNIQUE: Multidetector CT imaging of the abdomen and pelvis was performed following the standard protocol without IV contrast. COMPARISON:  05/14/2016 FINDINGS: Lower chest: Small right pleural effusion. Patchy coarse airspace opacities in the visualized right lower lobe posterior and lateral basal segments. Coronary and Aortic Atherosclerosis (ICD10-170.0). sternotomy wires. Hepatobiliary: Subcentimeter probable partially calcified stones in the  dependent aspect of the nondilated gallbladder. No focal liver lesion or biliary ductal dilatation evident. Pancreas: Unremarkable. No pancreatic ductal dilatation or surrounding inflammatory changes. Spleen: Normal in size without focal abnormality. Adrenals/Urinary Tract: Normal adrenal glands. Horseshoe kidney. No urolithiasis or hydronephrosis. Extensive renal arterial calcifications. Marked distention of the urinary bladder. Stomach/Bowel: Hiatal hernia. Stomach is nondilated. Small bowel decompressed. Moderate fecal material in the proximal colon and rectum, without dilatation. Multiple sigmoid diverticula without significant adjacent inflammatory/edematous change or abscess. Vascular/Lymphatic: Heavy atheromatous calcifications in the aorta and iliac arterial systems without aneurysm. No abdominal or pelvic adenopathy. Reproductive: Prostatic enlargement with scattered calcifications. Other: No ascites.  No free air. Musculoskeletal: Spondylitic changes L2-S1. Bilateral hip DJD. Negative for fracture or worrisome bone lesion. IMPRESSION: 1. New patchy airspace infiltrates in the right lower lobe suggesting pneumonia, with small effusion. 2. No acute abdominal process. 3. Additional nonacute as detailed above, including: Cholelithiasis Horseshoe kidney Extensive aortoiliac atherosclerosis Diverticulosis Prostatic enlargement with marked distention of the urinary bladder Lumbar and hip degenerative change Electronically Signed   By: Lucrezia Europe M.D.   On: 10/29/2017 12:30   Dg Chest 2 View  Result Date: 10/29/2017 CLINICAL DATA:  Shortness of breath and abdominal pain. History of lung cancer. History of COPD. EXAM: CHEST - 2 VIEW COMPARISON:  Chest x-rays dated 09/22/2017, 04/06/2017 and 04/10/2016 FINDINGS: There is a new ill-defined area of infiltrate at the right lung base superimposed on scarring and emphysematous changes. Heart size is normal.  No increased pulmonary vascularity. Extensive scarring in  the left hemithorax with superior retraction of left hilar structures and chronic pleural thickening in the left apex. The appearance of the left lung is unchanged. CABG. Extensive aortic atherosclerosis. No acute bone abnormality. Chronic blunting of the costophrenic angles consistent with emphysema. IMPRESSION: 1. Acute patchy infiltrate at the right lung base superimposed on emphysema and parenchymal scarring. 2. Chronic  stable scarring in the left hemithorax. 3.  Aortic Atherosclerosis (ICD10-I70.0). Electronically Signed   By: Lorriane Shire M.D.   On: 10/29/2017 11:33    My personal review of EKG: No sinus rhythm with RBBB, prolonged QTC of 527   Assessment & Plan:    Principal Problem:   Lobar pneumonia, unspecified organism ([place]) Admit to MedSurg.  IV Rocephin and azithromycin.  Has mild parapneumonic effusion which does not need thoracentesis for now.  Check sputum culture and urine strep antigen.  Robitussin as needed and Mucinex twice daily.   Active Problems:    Abdominal pain Has vague symptoms.  Could be due to pneumonia.  CT abdomen unremarkable.  Monitor for now.  Prolonged QTC And already received azithromycin in the ED.  Next doses tomorrow morning.  Will check EKG in the morning and if QTC still prolonged need to hold azithromycin.  Check magnesium.    CKD (chronic kidney disease), stage III (HCC) Mild worsening from baseline.  Will monitor with gentle hydration.  (Stop after 1 L) Hold Lasix for now.    COPD (chronic obstructive pulmonary disease) (HCC) Stable.  Does not need prednisone prescribed as outpatient.  Continue home inhaler and PRN albuterol neb     Diastolic CHF, chronic (HCC) Hypovolemic.  Monitor with gentle hydration.  Hold Lasix for now    Parkinson's disease ([place])  CAD with history of CABG /CVA Continue aspirin, statin and beta-blocker     DVT Prophylaxis Heparin -    AM Labs Ordered, also please review Full Orders  Family Communication:  Admission, patients condition and plan of care including tests being ordered have been discussed with the patient, his wife and son at bedside.  Code Status full code  Likely DC to home possibly in the next 48-72 hours if improved  Condition: [place] called: None  Admission status: Inpatient  Time spent in minutes : 50   Gift Rueckert M.D on 10/29/2017 at 3:26 PM  Between 7am to 7pm - Pager - [phone]. After 7pm go to [email or web address] - password [facility]  [facility] - Office  [phone]

## 2017-10-30 DIAGNOSIS — N183 Chronic kidney disease, stage 3 (moderate): Secondary | ICD-10-CM

## 2017-10-30 DIAGNOSIS — G20 Parkinson's disease: Secondary | ICD-10-CM

## 2017-10-30 DIAGNOSIS — J181 Lobar pneumonia, unspecified organism: Principal | ICD-10-CM

## 2017-10-30 DIAGNOSIS — R109 Unspecified abdominal pain: Secondary | ICD-10-CM

## 2017-10-30 DIAGNOSIS — I5032 Chronic diastolic (congestive) heart failure: Secondary | ICD-10-CM

## 2017-10-30 DIAGNOSIS — J431 Panlobular emphysema: Secondary | ICD-10-CM

## 2017-10-30 LAB — BASIC METABOLIC PANEL
ANION GAP: 7 (ref 5–15)
BUN: 48 mg/dL — AB (ref 8–23)
CALCIUM: 8.3 mg/dL — AB (ref 8.9–10.3)
CO2: 26 mmol/L (ref 22–32)
Chloride: 109 mmol/L (ref 98–111)
Creatinine, Ser: 1.9 mg/dL — ABNORMAL HIGH (ref 0.61–1.24)
GFR calc Af Amer: 33 mL/min — ABNORMAL LOW (ref >60)
GFR calc non Af Amer: 29 mL/min — ABNORMAL LOW (ref >60)
GLUCOSE: 84 mg/dL (ref 70–99)
Potassium: 3.3 mmol/L — ABNORMAL LOW (ref 3.5–5.1)
Sodium: 142 mmol/L (ref 135–145)

## 2017-10-30 LAB — URINE CULTURE: Culture: NO GROWTH

## 2017-10-30 LAB — CBC
HEMATOCRIT: 28.9 % — AB (ref 39.0–52.0)
HEMOGLOBIN: 9.1 g/dL — AB (ref 13.0–17.0)
MCH: 30.6 pg (ref 26.0–34.0)
MCHC: 31.5 g/dL (ref 30.0–36.0)
MCV: 97.3 fL (ref 78.0–100.0)
Platelets: 318 10*3/uL (ref 150–400)
RBC: 2.97 MIL/uL — ABNORMAL LOW (ref 4.22–5.81)
RDW: 15.6 % — ABNORMAL HIGH (ref 11.5–15.5)
WBC: 9.9 10*3/uL (ref 4.0–10.5)

## 2017-10-30 LAB — STREP PNEUMONIAE URINARY ANTIGEN: STREP PNEUMO URINARY ANTIGEN: NEGATIVE

## 2017-10-30 LAB — HIV ANTIBODY (ROUTINE TESTING W REFLEX): HIV Screen 4th Generation wRfx: NONREACTIVE

## 2017-10-30 MED ORDER — PREDNISONE 20 MG PO TABS: ORAL_TABLET | ORAL | 0 refills | Status: DC

## 2017-10-30 MED ORDER — AZITHROMYCIN 250 MG PO TABS: 250.0000 mg | ORAL_TABLET | Freq: Every day | ORAL | 0 refills | Status: DC

## 2017-10-30 NOTE — Progress Notes (Signed)
SATURATION QUALIFICATIONS: (This note is used to comply with regulatory documentation for home oxygen)  Patient Saturations on Room Air at Rest = 99%  Patient Saturations on Room Air while Ambulating = 95%  Patient Saturations on N/A Liters of oxygen while Ambulating = N/A  Please briefly explain why patient needs home oxygen: patient does not require oxygen to maintain O2 sats above 90%. O2 sats never dropped below 95% while ambulating on room air.

## 2017-10-30 NOTE — Progress Notes (Signed)
IV removed, 2x2 gauze and paper tape applied to site, patient tolerated well.  Reviewed AVS with patient and patient's daughter, both verbalized understanding.  Patient taken down to lobby via wheelchair and transported home by his daughter.

## 2017-10-30 NOTE — Discharge Summary (Signed)
Physician Discharge Summary  [name]:[id number] DOB: [birth year] DOA: 10/29/2017  PCP: Kathyrn Drown, MD  Admit date: 10/29/2017 Discharge date: 10/30/2017  Admitted From: Home Disposition: Home  Recommendations for Outpatient Follow-up:  1. Follow up with PCP in 1-2 weeks 2. Please obtain BMP/CBC in one week 3. Repeat chest x-ray in 3 to 4 weeks to ensure resolution  Home Health: Home health RN Equipment/Devices:  Discharge Condition: Stable CODE STATUS: Full code Diet recommendation: Heart healthy  Brief/Interim Summary: 84 year old male with a history of Parkinson's disease, chronic kidney disease stage III, COPD, presents to the hospital with cough productive of whitish sputum as well as lower abdominal discomfort.  Recently been placed on doxycycline by his primary care physician.  When his symptoms have persisted, he switched to Cefzil due to concern for early pneumonia as well as prednisone taper.  He took this antibiotic for approximately 2 days and came to the hospital with abdominal discomfort.  CT of the abdomen did not show any acute findings.  His son feels that he may develop abdominal soreness from persistent coughing.  At this time, his abdominal pain has improved.  He is tolerating a solid diet.  He was noted to be mildly dehydrated and received gentle hydration with improvement of renal function back to baseline.  He was continued on intravenous antibiotics.  He was found to have a right lower lobe pneumonia.  The following day, patient appeared to be significantly better and back to baseline.  He did improve faster than anticipated.  He will be transitioned to oral antibiotics, complete out a course of prednisone as well as bronchodilators.  Patient was ambulated and maintained good saturations without any significant shortness of breath.  Appears to be reasonable to discharge this patient and have follow-up as an outpatient.  He has been set up with home  health.  Discharge Diagnoses:  Principal Problem:   Lobar pneumonia, unspecified organism [facility]) Active Problems:   ANEMIA   Essential tremor   Abdominal pain   CKD (chronic kidney disease), stage III (HCC)   COPD (chronic obstructive pulmonary disease) (HCC)   Prediabetes   Diastolic CHF, chronic (HCC)   Parkinson's disease ([place])    Discharge Instructions  Discharge Instructions    Diet - low sodium heart healthy   Complete by:  As directed    Increase activity slowly   Complete by:  As directed      Allergies as of 10/30/2017      Reactions   Propranolol Nausea Only   Levaquin [levofloxacin In D5w] Hives, Nausea And Vomiting   Penicillins Hives, Swelling   Has patient had a PCN reaction causing immediate rash, facial/tongue/throat swelling, SOB or lightheadedness with hypotension: Yes Has patient had a PCN reaction causing severe rash involving mucus membranes or skin necrosis: No Has patient had a PCN reaction that required hospitalization No Has patient had a PCN reaction occurring within the last 10 years: No If all of the above answers are "NO", then may proceed with Cephalosporin use.   Sulfonamide Derivatives Hives, Swelling   Sympathomimetics Other (See Comments)   Tape Other (See Comments)   SKIN IS VERY THIN AND TEARS EASILY; PLEASE USE AN ALTERNATIVE TO TAPE!!      Medication List    TAKE these medications   albuterol 108 (90 Base) MCG/ACT inhaler Commonly known as:  VENTOLIN HFA INHALE 2 PUFFS INTO THE LUNGS EVERY 6 HOURS AS NEEDED FOR SHORTNESS OF BREATH   albuterol (  2.5 MG/3ML) 0.083% nebulizer solution Commonly known as:  PROVENTIL Inhale 3 mLs into the lungs every 4 (four) hours as needed for wheezing or shortness of breath.   aspirin EC 81 MG tablet Take 81 mg by mouth daily.   atorvastatin 40 MG tablet Commonly known as:  LIPITOR TAKE 1 TABLET (40 MG TOTAL) BY MOUTH DAILY.   azithromycin 250 MG tablet Commonly known as:  ZITHROMAX Take 1  tablet (250 mg total) by mouth daily.   budesonide-formoterol 160-4.5 MCG/ACT inhaler Commonly known as:  SYMBICORT INHALE 2 PUFF INTO THE LUNGS 2 TIMES DAILY.   carbidopa-levodopa 25-100 MG tablet Commonly known as:  SINEMET IR Take 2 tablets by mouth 3 (three) times daily.   cefPROZIL 500 MG tablet Commonly known as:  CEFZIL Take 1 tablet (500 mg total) by mouth 2 (two) times daily.   CENTRUM SILVER 50+MEN Tabs Take 1 tablet by mouth daily.   docusate sodium 100 MG capsule Commonly known as:  COLACE Take 100 mg by mouth daily as needed for mild constipation.   guaiFENesin 600 MG 12 hr tablet Commonly known as:  MUCINEX Take 1 tablet (600 mg total) by mouth 2 (two) times daily.   metoprolol tartrate 25 MG tablet Commonly known as:  LOPRESSOR Take 1 tablet (25 mg total) by mouth 2 (two) times daily.   nitroGLYCERIN 0.4 MG SL tablet Commonly known as:  NITROSTAT Place 1 tablet (0.4 mg total) under the tongue every 5 (five) minutes as needed for chest pain. May take up to 3 doses per episode.   predniSONE 20 MG tablet Commonly known as:  DELTASONE 3qd for 2d then 2qd for 2d then 1qd for 2d   SOOTHE XP Soln Place 1-2 drops into both eyes 2 (two) times daily as needed (for dry eyes).   tamsulosin 0.4 MG Caps capsule Commonly known as:  FLOMAX TAKE 1 CAPSULE (0.4 MG TOTAL) BY MOUTH AT BEDTIME.   tiotropium 18 MCG inhalation capsule Commonly known as:  SPIRIVA Place 1 capsule (18 mcg total) into inhaler and inhale daily.   torsemide 20 MG tablet Commonly known as:  DEMADEX Take 20 mg by mouth daily.       Allergies  Allergen Reactions  . Propranolol Nausea Only  . Levaquin [Levofloxacin In D5w] Hives and Nausea And Vomiting  . Penicillins Hives and Swelling    Has patient had a PCN reaction causing immediate rash, facial/tongue/throat swelling, SOB or lightheadedness with hypotension: Yes Has patient had a PCN reaction causing severe rash involving mucus  membranes or skin necrosis: No Has patient had a PCN reaction that required hospitalization No Has patient had a PCN reaction occurring within the last 10 years: No If all of the above answers are "NO", then may proceed with Cephalosporin use.   . Sulfonamide Derivatives Hives and Swelling  . Sympathomimetics Other (See Comments)  . Tape Other (See Comments)    SKIN IS VERY THIN AND TEARS EASILY; PLEASE USE AN ALTERNATIVE TO TAPE!!    Consultations:     Procedures/Studies: Ct Abdomen Pelvis Wo Contrast  Result Date: 10/29/2017 CLINICAL DATA:  Pt states that he has been having abd pain at night this has been going on for at least three weeks but it is getting worse. Patient does have diarrhea. No hx of surgery, trauma. No IV contrast due to kidney function. EXAM: CT ABDOMEN AND PELVIS WITHOUT CONTRAST TECHNIQUE: Multidetector CT imaging of the abdomen and pelvis was performed following the standard protocol without  IV contrast. COMPARISON:  05/14/2016 FINDINGS: Lower chest: Small right pleural effusion. Patchy coarse airspace opacities in the visualized right lower lobe posterior and lateral basal segments. Coronary and Aortic Atherosclerosis (ICD10-170.0). sternotomy wires. Hepatobiliary: Subcentimeter probable partially calcified stones in the dependent aspect of the nondilated gallbladder. No focal liver lesion or biliary ductal dilatation evident. Pancreas: Unremarkable. No pancreatic ductal dilatation or surrounding inflammatory changes. Spleen: Normal in size without focal abnormality. Adrenals/Urinary Tract: Normal adrenal glands. Horseshoe kidney. No urolithiasis or hydronephrosis. Extensive renal arterial calcifications. Marked distention of the urinary bladder. Stomach/Bowel: Hiatal hernia. Stomach is nondilated. Small bowel decompressed. Moderate fecal material in the proximal colon and rectum, without dilatation. Multiple sigmoid diverticula without significant adjacent  inflammatory/edematous change or abscess. Vascular/Lymphatic: Heavy atheromatous calcifications in the aorta and iliac arterial systems without aneurysm. No abdominal or pelvic adenopathy. Reproductive: Prostatic enlargement with scattered calcifications. Other: No ascites.  No free air. Musculoskeletal: Spondylitic changes L2-S1. Bilateral hip DJD. Negative for fracture or worrisome bone lesion. IMPRESSION: 1. New patchy airspace infiltrates in the right lower lobe suggesting pneumonia, with small effusion. 2. No acute abdominal process. 3. Additional nonacute as detailed above, including: Cholelithiasis Horseshoe kidney Extensive aortoiliac atherosclerosis Diverticulosis Prostatic enlargement with marked distention of the urinary bladder Lumbar and hip degenerative change Electronically Signed   By: Lucrezia Europe M.D.   On: 10/29/2017 12:30   Dg Chest 2 View  Result Date: 10/29/2017 CLINICAL DATA:  Shortness of breath and abdominal pain. History of lung cancer. History of COPD. EXAM: CHEST - 2 VIEW COMPARISON:  Chest x-rays dated 09/22/2017, 04/06/2017 and 04/10/2016 FINDINGS: There is a new ill-defined area of infiltrate at the right lung base superimposed on scarring and emphysematous changes. Heart size is normal.  No increased pulmonary vascularity. Extensive scarring in the left hemithorax with superior retraction of left hilar structures and chronic pleural thickening in the left apex. The appearance of the left lung is unchanged. CABG. Extensive aortic atherosclerosis. No acute bone abnormality. Chronic blunting of the costophrenic angles consistent with emphysema. IMPRESSION: 1. Acute patchy infiltrate at the right lung base superimposed on emphysema and parenchymal scarring. 2. Chronic stable scarring in the left hemithorax. 3.  Aortic Atherosclerosis (ICD10-I70.0). Electronically Signed   By: Lorriane Shire M.D.   On: 10/29/2017 11:33       Subjective: Feeling better.  No shortness of breath or  significant cough.  Abdominal pain is better.  Discharge Exam: Vitals:   10/30/17 0951 10/30/17 1321  BP:  (!) 167/65  Pulse:  65  Resp:  16  Temp:  97.6 F (36.4 C)  SpO2: 95% 97%   Vitals:   10/30/17 0604 10/30/17 0609 10/30/17 0951 10/30/17 1321  BP: (!) 155/70 (!) 101/59  (!) 167/65  Pulse: 61 86  65  Resp: 18 18  16   Temp: (!) 97.5 F (36.4 C) 99 F (37.2 C)  97.6 F (36.4 C)  TempSrc: Oral Oral  Oral  SpO2: 100% 97% 95% 97%  Weight:      Height:        General: Pt is alert, awake, not in acute distress Cardiovascular: RRR, S1/S2 +, no rubs, no gallops Respiratory: CTA bilaterally, no wheezing, no rhonchi Abdominal: Soft, NT, ND, bowel sounds + Extremities: no edema, no cyanosis    The results of significant diagnostics from this hospitalization (including imaging, microbiology, ancillary and laboratory) are listed below for reference.     Microbiology: Recent Results (from the past 240 hour(s))  Urine culture  Status: None   Collection Time: 10/29/17 10:38 AM  Result Value Ref Range Status   Specimen Description   Final    URINE, CLEAN CATCH Performed at [facility], [address] [place], [place] [address]    Special Requests   Final    NONE Performed at [facility], [address] [place], [place] [address]    Culture   Final    NO GROWTH Performed at [facility], [place] [address] [place], [place] [address]    Report Status 10/30/2017 FINAL  Final  Culture, blood (routine x 2) Call MD if unable to obtain prior to antibiotics being given     Status: None (Preliminary result)   Collection Time: 10/29/17  5:46 PM  Result Value Ref Range Status   Specimen Description LEFT ANTECUBITAL  Final   Special Requests   Final    BOTTLES DRAWN AEROBIC AND ANAEROBIC Blood Culture adequate volume   Culture   Final    NO GROWTH < 12 HOURS Performed at [facility], [address], [place], [place] [address]    Report Status PENDING  Incomplete   Culture, blood (routine x 2) Call MD if unable to obtain prior to antibiotics being given     Status: None (Preliminary result)   Collection Time: 10/29/17  5:50 PM  Result Value Ref Range Status   Specimen Description BLOOD RIGHT HAND  Final   Special Requests   Final    BOTTLES DRAWN AEROBIC AND ANAEROBIC Blood Culture adequate volume   Culture   Final    NO GROWTH < 12 HOURS Performed at [facility], [address] [place], [place] [address]    Report Status PENDING  Incomplete     Labs: BNP (last 3 results) Recent Labs    04/06/17 2131 09/22/17 1947  BNP 383.9* 250.5*   Basic Metabolic Panel: Recent Labs  Lab 10/29/17 1112 10/30/17 0641  NA 139 142  K 4.1 3.3*  CL 103 109  CO2 25 26  GLUCOSE 124* 84  BUN 57* 48*  CREATININE 2.20* 1.90*  CALCIUM 9.1 8.3*  MG 2.2  --    Liver Function Tests: Recent Labs  Lab 10/29/17 1112  AST 27  ALT <5  ALKPHOS 82  BILITOT 0.6  PROT 6.6  ALBUMIN 3.2*   Recent Labs  Lab 10/29/17 1112  LIPASE 27   No results for input(s): AMMONIA in the last 168 hours. CBC: Recent Labs  Lab 10/29/17 1112 10/30/17 0641  WBC 12.1* 9.9  NEUTROABS 10.0*  --   HGB 9.8* 9.1*  HCT 30.5* 28.9*  MCV 95.6 97.3  PLT 316 318   Cardiac Enzymes: Recent Labs  Lab 10/29/17 1112  TROPONINI <0.03   BNP: Invalid input(s): POCBNP CBG: No results for input(s): GLUCAP in the last 168 hours. D-Dimer No results for input(s): DDIMER in the last 72 hours. Hgb A1c No results for input(s): HGBA1C in the last 72 hours. Lipid Profile No results for input(s): CHOL, HDL, LDLCALC, TRIG, CHOLHDL, LDLDIRECT in the last 72 hours. Thyroid function studies No results for input(s): TSH, T4TOTAL, T3FREE, THYROIDAB in the last 72 hours.  Invalid input(s): FREET3 Anemia work up No results for input(s): VITAMINB12, FOLATE, FERRITIN, TIBC, IRON, RETICCTPCT in the last 72 hours. Urinalysis    Component Value Date/Time   COLORURINE STRAW (A)  10/29/2017 1038   APPEARANCEUR CLEAR 10/29/2017 1038   LABSPEC 1.006 10/29/2017 1038   PHURINE 5.0 10/29/2017 1038   GLUCOSEU  NEGATIVE 10/29/2017 1038   HGBUR NEGATIVE 10/29/2017 [place] 10/29/2017 1038   KETONESUR NEGATIVE 10/29/2017 1038   PROTEINUR NEGATIVE 10/29/2017 1038   UROBILINOGEN 0.2 10/21/2013 1655   NITRITE NEGATIVE 10/29/2017 1038   LEUKOCYTESUR SMALL (A) 10/29/2017 1038   Sepsis Labs Invalid input(s): PROCALCITONIN,  WBC,  LACTICIDVEN Microbiology Recent Results (from the past 240 hour(s))  Urine culture     Status: None   Collection Time: 10/29/17 10:38 AM  Result Value Ref Range Status   Specimen Description   Final    URINE, CLEAN CATCH Performed at [facility], [address], [place], [place] [address]    Special Requests   Final    NONE Performed at [facility], [address], [place], [place] [address]    Culture   Final    NO GROWTH Performed at [facility], [place] [address], [place], [place] [address]    Report Status 10/30/2017 FINAL  Final  Culture, blood (routine x 2) Call MD if unable to obtain prior to antibiotics being given     Status: None (Preliminary result)   Collection Time: 10/29/17  5:46 PM  Result Value Ref Range Status   Specimen Description LEFT ANTECUBITAL  Final   Special Requests   Final    BOTTLES DRAWN AEROBIC AND ANAEROBIC Blood Culture adequate volume   Culture   Final    NO GROWTH < 12 HOURS Performed at [facility], [address] [place], [place] [address]    Report Status PENDING  Incomplete  Culture, blood (routine x 2) Call MD if unable to obtain prior to antibiotics being given     Status: None (Preliminary result)   Collection Time: 10/29/17  5:50 PM  Result Value Ref Range Status   Specimen Description BLOOD RIGHT HAND  Final   Special Requests   Final    BOTTLES DRAWN AEROBIC AND ANAEROBIC Blood Culture adequate volume   Culture   Final    NO GROWTH < 12 HOURS Performed at [facility], [address], [place], [place] [address]    Report Status PENDING  Incomplete     Time coordinating discharge: 53mins  SIGNED:   Kathie Dike, MD  [facility] 10/30/2017, 6:47 PM Pager   If 7PM-7AM, please contact night-coverage [email or web address] Password [email or web address]

## 2017-10-30 NOTE — Progress Notes (Signed)
Initial Nutrition Assessment  DOCUMENTATION CODES:  Not applicable  INTERVENTION:  Continue Ensure Enlive po BID, each supplement provides 350 kcal and 20 grams of protein  Will monitor PO intake and f/u as warranted.   NUTRITION DIAGNOSIS:  Inadequate oral intake related to acute illness(PNA) as evidenced by loss of 3.8% bw x2 weeks  GOAL:  Patient will meet greater than or equal to 90% of their needs  MONITOR:  PO intake, Supplement acceptance, Labs, Weight trends  REASON FOR ASSESSMENT:  Malnutrition Screening Tool    ASSESSMENT:  84 y/o male PMHx Lung cancer in remission, COPD (not on o2), HTN, CAD s/p CABG, HF, CVA, Parkinsons, CKD3. Presented to ED with cough, phlegm and abd pain refractory to outpatient abx. Admitted for IV Abx of lobar PNA.  RD operating remotely on weekend. Patient chart reviewed due to MST of 2, w/ pt reporting unintentional weight loss and decreased intake due to poor appetite.   Charted weight history does show weight loss. He was 129 lbs just a couple weeks ago. He is now 124 lbs. This is a loss of 3.8% bw x 2 weeks. He was 132-136 lbs in April, indicating a loss of ~10 lbs in 3 months, just about 7.5% of bw.   Physical Exam: Unable to conduct  ED notes show patient has had gastric pain for 2-3 weeks, likely the cause of decreased intake/wt loss. Of note, chart notes show patients abdominal pain is not associated with PO intake and, At this time, patient has eaten 75% of the two meals that have been documented. Though he is eating well, will would continue Ensure given his weight loss.   Labs: Albumin: 3.2, BUN/Creat: 48/1.90, Hgb:9.1/28.9, K:3.3, BG: 85-125 Meds: PO abx, Sinemet, Ensure Enlive, MVI with min, IVF, IV abx  Recent Labs  Lab 10/29/17 1112 10/30/17 0641  NA 139 142  K 4.1 3.3*  CL 103 109  CO2 25 26  BUN 57* 48*  CREATININE 2.20* 1.90*  CALCIUM 9.1 8.3*  MG 2.2  --   GLUCOSE 124* 84   NUTRITION - FOCUSED PHYSICAL  EXAM: Unable to conduct at this time  Diet Order:   Diet Order           Diet regular Room service appropriate? Yes; Fluid consistency: Thin  Diet effective now         EDUCATION NEEDS:  No education needs have been identified at this time  Skin:  Skin Assessment: Reviewed RN Assessment  Last BM:  7/19  Height:  Ht Readings from Last 1 Encounters:  10/29/17 5\' 7"  (1.702 m)   Weight:  Wt Readings from Last 1 Encounters:  10/29/17 124 lb 1.9 oz (56.3 kg)   Wt Readings from Last 10 Encounters:  10/29/17 124 lb 1.9 oz (56.3 kg)  10/25/17 128 lb (58.1 kg)  10/18/17 129 lb (58.5 kg)  10/13/17 128 lb (58.1 kg)  09/27/17 129 lb (58.5 kg)  09/22/17 127 lb 6.8 oz (57.8 kg)  09/08/17 131 lb (59.4 kg)  08/12/17 133 lb (60.3 kg)  08/05/17 133 lb (60.3 kg)  07/29/17 136 lb 6.4 oz (61.9 kg)   Ideal Body Weight:  67.27 kg  BMI:  Body mass index is 19.44 kg/m.  Estimated Nutritional Needs:  Kcal:  1600-1800 (28-32 kcal/kg bw) Protein:  73-85g Pro (1.3-1.5 g/kg bw) Fluid:  1.6-1.8 L fluid ( 35ml/kcal)  Burtis Junes RD, LDN, CNSC Clinical Nutrition Available Tues-Sat via Pager: [phone] 10/30/2017 11:02 AM

## 2017-11-01 ENCOUNTER — Ambulatory Visit (HOSPITAL_COMMUNITY): Payer: Medicare Other | Admitting: Physical Therapy

## 2017-11-01 ENCOUNTER — Encounter: Payer: Self-pay | Admitting: Family Medicine

## 2017-11-01 ENCOUNTER — Ambulatory Visit: Payer: Medicare Other | Admitting: Family Medicine

## 2017-11-01 VITALS — BP 110/50 | Temp 98.7°F | Wt 135.0 lb

## 2017-11-01 DIAGNOSIS — J189 Pneumonia, unspecified organism: Secondary | ICD-10-CM

## 2017-11-01 DIAGNOSIS — J181 Lobar pneumonia, unspecified organism: Secondary | ICD-10-CM

## 2017-11-01 NOTE — Patient Instructions (Signed)
Finish antibiotic  Do chest xray in 3 weeks

## 2017-11-01 NOTE — Progress Notes (Signed)
   Subjective:    Patient ID: [name], male    DOB: 04-May-1921, 84 y.o.   MRN: [id number]  HPI Patient is here today with his son. Still having some chest congestion and was admitted to the hospital Friday for pneumonia. Was released on Sat.  Patient was in the hospital for pneumonia and still having congestion coughing no high fevers no vomiting diarrhea sweats chills no other symptoms currently PMH benign  Review of Systems  Constitutional: Negative for activity change, chills and fever.  HENT: Positive for congestion and rhinorrhea. Negative for ear pain.   Eyes: Negative for discharge.  Respiratory: Positive for cough. Negative for wheezing.   Cardiovascular: Negative for chest pain.  Gastrointestinal: Negative for nausea and vomiting.  Musculoskeletal: Negative for arthralgias.   Please see above.    Objective:   Physical Exam  Constitutional: He appears well-developed.  HENT:  Head: Normocephalic.  Mouth/Throat: Oropharynx is clear and moist. No oropharyngeal exudate.  Neck: Normal range of motion.  Cardiovascular: Normal rate, regular rhythm and normal heart sounds.  No murmur heard. Pulmonary/Chest: Effort normal. He has no wheezes.  Lymphadenopathy:    He has no cervical adenopathy.  Neurological: He exhibits normal muscle tone.  Skin: Skin is warm and dry.  Nursing note and vitals reviewed.   Does have some crackles in the right base not as bad as what it was several days ago      Assessment & Plan:  Viral syndrome Pneumonia Z-Pak Continue current measures Follow-up x-ray 3 weeks Further labs x-rays not indicated currently Patient should be able to tolerate things at home well but if ongoing trouble follow-up sooner otherwise follow-up in September as planned

## 2017-11-02 ENCOUNTER — Telehealth (HOSPITAL_COMMUNITY): Payer: Self-pay | Admitting: Family Medicine

## 2017-11-02 NOTE — Telephone Encounter (Signed)
11/02/17  son called to cx this week... his dad just got out of the hospital with pneumonia

## 2017-11-03 ENCOUNTER — Ambulatory Visit (HOSPITAL_COMMUNITY): Payer: Medicare Other

## 2017-11-03 ENCOUNTER — Telehealth (HOSPITAL_COMMUNITY): Payer: Self-pay | Admitting: Family Medicine

## 2017-11-03 LAB — CULTURE, BLOOD (ROUTINE X 2)
CULTURE: NO GROWTH
Culture: NO GROWTH
SPECIAL REQUESTS: ADEQUATE
Special Requests: ADEQUATE

## 2017-11-03 NOTE — Telephone Encounter (Signed)
11/03/17  I called patient's son and he was okay with moving to 8/1 but will play this by ear to see how he is feeling after getting out of the hospital

## 2017-11-04 ENCOUNTER — Telehealth: Payer: Self-pay | Admitting: Family Medicine

## 2017-11-04 NOTE — Telephone Encounter (Signed)
miralax one scoop biid until he has a b m  Plus 30 cc's of milk of magnesia one time dose

## 2017-11-04 NOTE — Telephone Encounter (Signed)
Pt's son Ronalee Belts calling in wanting some advice for [place]. He hasnt had a bowel movement in 6 days.

## 2017-11-04 NOTE — Telephone Encounter (Signed)
Advised son(DPR) per Dr Arlana Lindau one scoop biid until he has a b m  Plus 30 cc's of milk of magnesia one time dose. Son verbalized understanding.

## 2017-11-04 NOTE — Telephone Encounter (Signed)
Patient on antibiotic and prednisone for pneumonia

## 2017-11-05 ENCOUNTER — Telehealth: Payer: Self-pay | Admitting: *Deleted

## 2017-11-05 ENCOUNTER — Ambulatory Visit (HOSPITAL_COMMUNITY): Payer: Medicare Other

## 2017-11-05 NOTE — Telephone Encounter (Signed)
Son(DPR) called stating the patient was having ongoing constipation. Patient started miralax Bid yesterday and did a one time dose of MOM-see previous message. Consult with Dr Nicki Reaper- Try a senokot suppository this am if no results then drink a bottle of mag citrate this pm. If develops belly pain needs to go straight to ER and if no results by Monday needs office visit. Son verbalized understanding.

## 2017-11-08 ENCOUNTER — Telehealth (HOSPITAL_COMMUNITY): Payer: Self-pay | Admitting: Family Medicine

## 2017-11-08 NOTE — Telephone Encounter (Signed)
11/08/17  son called and cx this weeks appts.. said he was still recovering from when he was in the hospital the other week

## 2017-11-09 ENCOUNTER — Ambulatory Visit (INDEPENDENT_AMBULATORY_CARE_PROVIDER_SITE_OTHER): Payer: Medicare Other | Admitting: Family Medicine

## 2017-11-09 ENCOUNTER — Ambulatory Visit (HOSPITAL_COMMUNITY): Payer: Medicare Other

## 2017-11-09 VITALS — Wt 135.0 lb

## 2017-11-09 DIAGNOSIS — M10041 Idiopathic gout, right hand: Secondary | ICD-10-CM | POA: Diagnosis not present

## 2017-11-09 MED ORDER — HYDROCODONE-ACETAMINOPHEN 5-325 MG PO TABS: ORAL_TABLET | ORAL | 0 refills | Status: DC

## 2017-11-09 MED ORDER — PREDNISONE 20 MG PO TABS: ORAL_TABLET | ORAL | 0 refills | Status: DC

## 2017-11-09 MED ORDER — PANTOPRAZOLE SODIUM 40 MG PO TBEC: 40.0000 mg | DELAYED_RELEASE_TABLET | Freq: Every day | ORAL | 3 refills | Status: DC

## 2017-11-09 NOTE — Progress Notes (Signed)
   Subjective:    Patient ID: [name], male    DOB: 22-Feb-1922, 84 y.o.   MRN: [id number]  HPI  Patient arrives with right hand swelling Relates pain discomfort with movement of his hand relates swelling in the joint relates stiffness pain denies high fever chills sweats PMH benign  Review of Systems Please see above in regards to current symptoms denies fevers does get some shortness of breath with his COPD denies any other underlying issues currently    Objective:   Physical Exam  Lungs clear respiratory rate normal heart regular has inflammation in the first MCP joint on the right hand consistent with the probability of gout I doubt cellulitis or septic arthritis  15 minutes was spent with patient today discussing healthcare issues which they came.  More than 50% of this visit-total duration of visit-was spent in counseling and coordination of care.  Please see diagnosis regarding the focus of this coordination and care     Assessment & Plan:  Arthralgia I doubt that this is a infected joint we do need to check some blood work and the likely gout I recommend prednisone in the next several days Hold off on any antibiotics Follow-up if progressive troubles If high fevers or other problems follow-up immediately

## 2017-11-10 ENCOUNTER — Other Ambulatory Visit: Payer: Self-pay | Admitting: Family Medicine

## 2017-11-10 LAB — CBC WITH DIFFERENTIAL/PLATELET
Basophils Absolute: 0 10*3/uL (ref 0.0–0.2)
Basos: 0 %
EOS (ABSOLUTE): 0.1 10*3/uL (ref 0.0–0.4)
EOS: 1 %
HEMATOCRIT: 31.2 % — AB (ref 37.5–51.0)
HEMOGLOBIN: 10.3 g/dL — AB (ref 13.0–17.7)
IMMATURE GRANS (ABS): 0 10*3/uL (ref 0.0–0.1)
Immature Granulocytes: 0 %
LYMPHS ABS: 2.1 10*3/uL (ref 0.7–3.1)
LYMPHS: 17 %
MCH: 30.6 pg (ref 26.6–33.0)
MCHC: 33 g/dL (ref 31.5–35.7)
MCV: 93 fL (ref 79–97)
MONOCYTES: 13 %
Monocytes Absolute: 1.6 10*3/uL — ABNORMAL HIGH (ref 0.1–0.9)
NEUTROS ABS: 8.9 10*3/uL — AB (ref 1.4–7.0)
Neutrophils: 69 %
Platelets: 276 10*3/uL (ref 150–450)
RBC: 3.37 x10E6/uL — AB (ref 4.14–5.80)
RDW: 14.1 % (ref 12.3–15.4)
WBC: 12.8 10*3/uL — ABNORMAL HIGH (ref 3.4–10.8)

## 2017-11-10 LAB — URIC ACID: URIC ACID: 8 mg/dL (ref 3.7–8.6)

## 2017-11-10 LAB — SEDIMENTATION RATE: Sed Rate: 22 mm/hr (ref 0–30)

## 2017-11-10 LAB — C-REACTIVE PROTEIN: CRP: 54 mg/L — ABNORMAL HIGH (ref 0–10)

## 2017-11-10 MED ORDER — CEPHALEXIN 500 MG PO CAPS: ORAL_CAPSULE | ORAL | 0 refills | Status: DC

## 2017-11-11 ENCOUNTER — Encounter (HOSPITAL_COMMUNITY): Payer: Medicare Other | Admitting: Physical Therapy

## 2017-11-12 ENCOUNTER — Ambulatory Visit (HOSPITAL_COMMUNITY): Payer: Medicare Other

## 2017-11-12 ENCOUNTER — Telehealth: Payer: Self-pay | Admitting: Family Medicine

## 2017-11-12 NOTE — Telephone Encounter (Signed)
[name] called to say he is doing much better swelling down and pain is less. Pt has not had to have any pain med since he was in the office; but when he does need pain med he tolerates it well. [name] states that he is very appreciative of all the help given.

## 2017-11-15 ENCOUNTER — Encounter (HOSPITAL_COMMUNITY): Payer: Self-pay

## 2017-11-15 ENCOUNTER — Ambulatory Visit (HOSPITAL_COMMUNITY): Payer: Medicare Other | Attending: Family Medicine

## 2017-11-15 DIAGNOSIS — R296 Repeated falls: Secondary | ICD-10-CM | POA: Diagnosis present

## 2017-11-15 DIAGNOSIS — R29818 Other symptoms and signs involving the nervous system: Secondary | ICD-10-CM | POA: Diagnosis present

## 2017-11-15 NOTE — Patient Instructions (Signed)
Tandem Stance    Right foot in front of left, heel touching toe both feet "straight ahead". Stand on Foot Triangle of Support with both feet. Balance in this position 30___ seconds; eyes closed. Do with left foot in front of right.  Copyright  VHI. All rights reserved.   Feet Together, Arm Motion - Eyes Closed    With eyes closed and feet together, move arms up and down: to front. Hold 30 seconds; 3 sets. Do ____ sessions per day.  Copyright  VHI. All rights reserved.  Seated Alternating Leg Raise (Marching)    Sit on ball. Raise bent knee and return. Repeat with other leg. Do _2__ sets of 10___ repetitions.  Copyright  VHI. All rights reserved.

## 2017-11-15 NOTE — Therapy (Signed)
[facility] [place], [place], [address] Phone: [phone]   Fax:  [phone]  Physical Therapy Treatment  Patient Details  Name: [name] MRN: [id number] Date of Birth: Aug 19, 1921 Referring Provider: Sallee Lange   Encounter Date: 11/15/2017  PT End of Session - 11/15/17 1113    Visit Number  2    Number of Visits  24    Date for PT Re-Evaluation  12/22/17    Authorization Type  [employer] Medicare    Authorization Time Period  10/21/17-12/22/17 (reassessment on 11/21/17)    Authorization - Visit Number  2    Authorization - Number of Visits  10    PT Start Time  1030    PT Stop Time  1112    PT Time Calculation (min)  42 min    Activity Tolerance  Patient tolerated treatment well    Behavior During Therapy  [facility] for tasks assessed/performed       Past Medical History:  Diagnosis Date  . ASCVD (arteriosclerotic cardiovascular disease)     CABG in 04/1993; and negative stress nuclear study in 08/2001  . Benign essential tremor   . CAD (coronary artery disease)   . Cancer ([place])    skin  . Cerebrovascular disease    Right carotid bruit-40-69% left internal carotid artery stenosis in 4/06; followed [facility]  . Chronic kidney disease, stage II (mild)    Creatinine-1.6 in 09/2008  . COPD (chronic obstructive pulmonary disease) ([place])   . Degenerative joint disease of knee, left   . Gout   . Hiatal hernia   . Horseshoe kidney   . Hyperlipidemia   . Hypertension   . Lung cancer ([place]) 2013  . Normocytic anemia   . Prediabetes   . Pulmonary disease   . Renal insufficiency   . Syncope   . Tobacco abuse, in remission    40 pack years; discontinued in 1980    Past Surgical History:  Procedure Laterality Date  . CARDIAC SURGERY    . COLONOSCOPY W/ POLYPECTOMY  1985  . CORONARY ARTERY BYPASS GRAFT  1995  . LESION EXCISION    . PILONIDAL CYST EXCISION  1948    There were no vitals filed for this visit.  Subjective  Assessment - 11/15/17 1112    Subjective  Pt arrives with Son who helps provide history. Recent short hospital stay due to pneumonia but feeling better today. using Rollator has really helped steadiness and confidence walking. Not going to church lately - too many stairs and ramp too far from worship hall.     Patient is accompained by:  Family member    Pertinent History  Falls history prior to this year but less injurious, son estimates 10-15 falls in past 5 years. Parkinsons has been diagnosed for about 10 years. Some tremors, some mild freezing, Son suspects carbidopa is not making much difference in symptoms.     How long can you sit comfortably?  none    How long can you stand comfortably?  none    How long can you walk comfortably?  5 minutes, with rollator (will get out and walk to church twice weekly)     Patient Stated Goals  fall less frequently.     Currently in Pain?  No/denies                       [facility] Adult PT Treatment/Exercise - 11/15/17 0001  Transfers   Five time sit to stand comments   13.16.secs    Transfer Cueing  to use armrests of chair and handles of Rollator      Balance   Balance Assessed  Yes      Static Standing Balance   Static Standing - Balance Support  No upper extremity supported    Static Standing - Level of Assistance  5: Stand by assistance    Single Leg Stance - Right Leg  2    Single Leg Stance - Left Leg  6      Knee/Hip Exercises: Seated   Long Arc Quad  2 sets;15 reps    Other Seated Knee/Hip Exercises  heel/toe raises 2x10    Other Seated Knee/Hip Exercises  shoulder scaption 2x10    Marching  2 sets;15 reps          Balance Exercises - 11/15/17 1235      Balance Exercises: Standing   Standing Eyes Closed  Narrow base of support (BOS);3 reps;30 secs    Tandem Stance  2 reps;30 secs semi-tandem        PT Education - 11/15/17 1112    Education provided  Yes    Education Details  educated patient and son re:  eyes closed (feet togther balance, semi-tandem balance) and seated marching exercises.    Person(s) Educated  Patient;Child(ren)    Methods  Explanation;Demonstration;Handout    Comprehension  Verbalized understanding;Returned demonstration;Need further instruction       PT Short Term Goals - 11/15/17 1113      PT SHORT TERM GOAL #1   Title  At 3 weeks pt/son independent in dynamic balance/LSVT style home program to improve falls recovery.     Status  On-going      PT SHORT TERM GOAL #2   Title  At 4 weeks pt will demonstrate tolerance of 626ft AMB without LLE pain limitations at >0.42m/s.     Status  On-going      PT SHORT TERM GOAL #3   Title  At 4 weeks patient will demonstrate BBT> 42    Status  On-going      PT SHORT TERM GOAL #4   Status  On-going        PT Long Term Goals - 11/15/17 1113      PT LONG TERM GOAL #1   Title  At 8 weeks pt will be independent in home based LE/UE strengthening program to improve falls recovery.     Status  On-going      PT LONG TERM GOAL #2   Title  At 8 weeks patient will demonstrate BBT socre> 46/56.     Status  On-going      PT LONG TERM GOAL #3   Title  At 8 weeks pt will demonstrate tolerance of 968ft AMB c 4WW at >0.3m/s to improve ability to access the community safely.     Status  On-going            Plan - 11/15/17 1113    Clinical Impression Statement  Re-assessment performed as patient not seen in clinic since 10/21/17 and has a short stay in hospital in between. Overall, patient appears to be functioning at approximate level. Patient did have mild losses of balance in clinic when performing semi-tandem balance requiring assistance to recover. Patient continues to ambulated with Rollator when outside the home. Patient will benefit from skilled PT intervention to address strength, balance, function, safety, motor planning and decreased  capacity to recover loss of balance to reduce risk of falls and associated potential  injuries.    Rehab Potential  Good    PT Frequency  -- 3x/w for 2 weeks-> 2x/w for 4 weeks, then 1x/w for 2 weeks.     PT Duration  8 weeks    PT Treatment/Interventions  ADLs/Self Care Home Management;Electrical Stimulation;Moist Heat;DME Instruction;Gait training;Stair training;Functional mobility training;Therapeutic activities;Therapeutic exercise;Balance training;Neuromuscular re-education;Patient/family education;Manual techniques;Passive range of motion;Dry needling;Taping;Energy conservation;Cognitive remediation;Cryotherapy    PT Next Visit Plan  Establish basic dynamic balance program for home that Son can assist with, establish basic chair based exercise program that patient can perform independently. Review treatment goals.     PT Home Exercise Plan  None at eval.     Consulted and Agree with Plan of Care  Family member/caregiver;Patient    Family Member Consulted  Son       Patient will benefit from skilled therapeutic intervention in order to improve the following deficits and impairments:  Abnormal gait, Cardiopulmonary status limiting activity, Decreased activity tolerance, Decreased balance, Decreased coordination, Decreased endurance, Decreased knowledge of use of DME, Decreased strength, Difficulty walking, Hypomobility, Increased muscle spasms, Impaired perceived functional ability, Impaired flexibility, Improper body mechanics, Postural dysfunction  Visit Diagnosis: Repeated falls  Other symptoms and signs involving the nervous system     Problem List Patient Active Problem List   Diagnosis Date Noted  . Lobar pneumonia, unspecified organism ([place]) 10/29/2017  . Pleural effusion, right   . Pedal edema 05/14/2017  . Parkinson's disease ([place]) 10/05/2016  . Diastolic CHF, chronic ([place]) 05/10/2015  . Dizziness   . Coronary artery disease involving coronary bypass graft of native heart without angina pectoris   . Near syncope 04/02/2015  . Diarrhea 04/02/2015  . COPD  exacerbation ([place]) 06/04/2013  . Shortness of breath 06/04/2013  . Acute respiratory failure with hypoxia ([place]) 06/04/2013  . Hemoptysis 08/10/2012  . Prediabetes 08/28/2011  . Lung cancer ([place]) 08/25/2011  . COPD (chronic obstructive pulmonary disease) ([place]) 08/25/2011  . CKD (chronic kidney disease), stage III ([place]) 12/07/2010  . Chest pain 08/18/2010  . Abdominal pain 08/18/2010  . Syncope   . Tobacco abuse, in remission   . Degenerative joint disease of knee, left   . COLONIC POLYPS 05/20/2009  . Hyperlipidemia 05/20/2009  . ANEMIA 05/20/2009  . Essential tremor 05/20/2009  . ATHEROSCLEROTIC CARDIOVASCULAR DISEASE 05/20/2009  . CEREBROVASCULAR DISEASE 05/20/2009  . Essential hypertension 01/14/2009    Floria Raveling. Hartnett-Rands, MS, PT Per [place] #[id number] 11/15/2017, 12:45 PM  [facility] [address] [place], [place], [address] Phone: [phone]   Fax:  [phone]  Name: [name] MRN: [id number] Date of Birth: 20-Mar-1922

## 2017-11-17 ENCOUNTER — Telehealth (HOSPITAL_COMMUNITY): Payer: Self-pay

## 2017-11-17 NOTE — Telephone Encounter (Signed)
left a very clear message to request  to change their appoint on 8/26 (to try and work a wound patient in that needed 71mins.)

## 2017-11-19 ENCOUNTER — Ambulatory Visit (HOSPITAL_COMMUNITY): Payer: Medicare Other

## 2017-11-19 DIAGNOSIS — R296 Repeated falls: Secondary | ICD-10-CM | POA: Diagnosis not present

## 2017-11-19 DIAGNOSIS — R29818 Other symptoms and signs involving the nervous system: Secondary | ICD-10-CM

## 2017-11-19 NOTE — Therapy (Signed)
[facility] [address] [place], [place], [address] Phone: [phone]   Fax:  [phone]  Physical Therapy Treatment  Patient Details  Name: BEHNAM SHAWHAN MRN: [id number] Date of Birth: [birth year] Referring Provider: Lilyan Punt   Encounter Date: 11/19/2017  PT End of Session - 11/19/17 1846    Visit Number  3    Number of Visits  24    Date for PT Re-Evaluation  12/22/17    Authorization Type  [employer]HC Medicare    Authorization Time Period  10/21/17-12/22/17 (reassessment on 11/21/17)    Authorization - Visit Number  3    Authorization - Number of Visits  10    PT Start Time  1034    PT Stop Time  1115    PT Time Calculation (min)  41 min    Activity Tolerance  Patient tolerated treatment well    Behavior During Therapy  [facility] for tasks assessed/performed       Past Medical History:  Diagnosis Date  . ASCVD (arteriosclerotic cardiovascular disease)     CABG in 04/1993; and negative stress nuclear study in 08/2001  . Benign essential tremor   . CAD (coronary artery disease)   . Cancer ([facility])    skin  . Cerebrovascular disease    Right carotid bruit-40-69% left internal carotid artery stenosis in 4/06; followed [facility]  . Chronic kidney disease, stage II (mild)    Creatinine-1.6 in 09/2008  . COPD (chronic obstructive pulmonary disease) ([facility])   . Degenerative joint disease of knee, left   . Gout   . Hiatal hernia   . Horseshoe kidney   . Hyperlipidemia   . Hypertension   . Lung cancer ([facility]) 2013  . Normocytic anemia   . Prediabetes   . Pulmonary disease   . Renal insufficiency   . Syncope   . Tobacco abuse, in remission    40 pack years; discontinued in 1980    Past Surgical History:  Procedure Laterality Date  . CARDIAC SURGERY    . COLONOSCOPY W/ POLYPECTOMY  1985  . CORONARY ARTERY BYPASS GRAFT  1995  . LESION EXCISION    . PILONIDAL CYST EXCISION  1948    There were no vitals filed for this visit.   Subjective  Assessment - 11/19/17 1847    Symptoms/Limitations  Subjective Patient arrived with son and reported he started the nex exercises for HEP provided last sesssion. He reports he is feeling well and denies pain.  Patient is accompained by: Family member  Pertinent History Falls history prior to this year but less injurious, son estimates 10-15 falls in past 5 years. Parkinsons has been diagnosed for about 10 years. Some tremors, some mild freezing, Son suspects carbidopa is not making much difference in symptoms.   How long can you sit comfortably? none  How long can you stand comfortably? none  How long can you walk comfortably? 5 minutes, with rollator (will get out and walk to church twice weekly)   Patient Stated Goals fall less frequently.   Pain Assessment  Currently in Pain? No/denies     [facility] Adult PT Treatment/Exercise - 11/19/17 0001    Knee/Hip Exercises: Standing  Other Standing Knee Exercises Forward punch: uppercut, bil UE 1x 15 reps with Red Theraband  Knee/Hip Exercises: Seated  Marching Both;2 sets;10 reps;Limitations  Marching Limitations 3 second holds     [facility]) - 11/19/17 1053    PWR! exercises Moves in sitting  Moves  in Standing  PWR! Twist 2x 10 reps  Functional PWR! Moves  PWR! Step Through Forward/Back 1x 10 reps bil directions, 6" hurdle with reach for cone from contralatearl UE  Reaching Comments 2x 10 reps bil, standing, twist/reach to tap cones  Moves in Sitting  PWR! Up 2x 10  PWR! Rock 2x 10  PWR! Twist 2x 10     PT Education - 11/19/17 1847    Education provided Yes  Education Details Educated on [employer]! exercises and provided seated PWR! HEP to progress home strengthening.  Person(s) Educated Patient;Child(ren)  Methods Explanation;Demonstration;Handout  Comprehension Verbalized understanding;Returned demonstration     PT Short Term Goals - 11/15/17 1113      PT SHORT TERM GOAL #1   Title  At 3 weeks pt/son independent in dynamic  balance/LSVT style home program to improve falls recovery.     Status  On-going      PT SHORT TERM GOAL #2   Title  At 4 weeks pt will demonstrate tolerance of 673ft AMB without LLE pain limitations at >0.16m/s.     Status  On-going      PT SHORT TERM GOAL #3   Title  At 4 weeks patient will demonstrate BBT> 42    Status  On-going      PT SHORT TERM GOAL #4   Status  On-going        PT Long Term Goals - 11/15/17 1113      PT LONG TERM GOAL #1   Title  At 8 weeks pt will be independent in home based LE/UE strengthening program to improve falls recovery.     Status  On-going      PT LONG TERM GOAL #2   Title  At 8 weeks patient will demonstrate BBT socre> 46/56.     Status  On-going      PT LONG TERM GOAL #3   Title  At 8 weeks pt will demonstrate tolerance of 944ft AMB c 4WW at >0.17m/s to improve ability to access the community safely.     Status  On-going        Plan - 11/19/17 1847    Clinical Impression Statement Patient arrived with rollator and demonstrates safe use of AD for ambulation. He initiated seated and stand PWR! Exercises this session to target functional movement training, coordination/timing, and balance deficits related to Parkinson?Ts. Therapeutic exercises for Bil UE/LE were also performed to strengthening. He demonstrates impaired timing with exercises and requires cues to pace his movements and take a break between repetitions to maintain good quality movements. He will continue to benefit from skilled PT intervention to address strength, balance, and motor planning deficits to reduce fall risk and improve functional mobility.  Pt will benefit from skilled therapeutic intervention in order to improve on the following deficits Abnormal gait;Cardiopulmonary status limiting activity;Decreased activity tolerance;Decreased balance;Decreased coordination;Decreased endurance;Decreased knowledge of use of DME;Decreased strength;Difficulty  walking;Hypomobility;Increased muscle spasms;Impaired perceived functional ability;Impaired flexibility;Improper body mechanics;Postural dysfunction  Rehab Potential Good  PT Frequency  (3x/w for 2 weeks-> 2x/w for 4 weeks, then 1x/w for 2 weeks. )  PT Duration 8 weeks  PT Treatment/Interventions ADLs/Self Care Home Management;Electrical Stimulation;Moist Heat;DME Instruction;Gait training;Stair training;Functional mobility training;Therapeutic activities;Therapeutic exercise;Balance training;Neuromuscular re-education;Patient/family education;Manual techniques;Passive range of motion;Dry needling;Taping;Energy conservation;Cognitive remediation;Cryotherapy  PT Next Visit Plan Continue to progress basic dynamic balance program for home that Son can assist with, include basic chair based exercise program that patient can perform independently. Continue with PWR! exercises and functional strengthening  for posture. Focus on timing/motor control.  PT Home Exercise Plan Seated marching, PWR! Up and Reach,   Consulted and Agree with Plan of Care Family member/caregiver;Patient  Family Member Consulted Son     Patient will benefit from skilled therapeutic intervention in order to improve the following deficits and impairments:  Abnormal gait, Cardiopulmonary status limiting activity, Decreased activity tolerance, Decreased balance, Decreased coordination, Decreased endurance, Decreased knowledge of use of DME, Decreased strength, Difficulty walking, Hypomobility, Increased muscle spasms, Impaired perceived functional ability, Impaired flexibility, Improper body mechanics, Postural dysfunction  Visit Diagnosis: Repeated falls  Other symptoms and signs involving the nervous system     Problem List Patient Active Problem List   Diagnosis Date Noted  . Lobar pneumonia, unspecified organism ([facility]) 10/29/2017  . Pleural effusion, right   . Pedal edema 05/14/2017  . Parkinson's disease ([facility]) 10/05/2016   . Diastolic CHF, chronic ([facility]) 05/10/2015  . Dizziness   . Coronary artery disease involving coronary bypass graft of native heart without angina pectoris   . Near syncope 04/02/2015  . Diarrhea 04/02/2015  . COPD exacerbation ([facility]) 06/04/2013  . Shortness of breath 06/04/2013  . Acute respiratory failure with hypoxia ([facility]) 06/04/2013  . Hemoptysis 08/10/2012  . Prediabetes 08/28/2011  . Lung cancer ([facility]) 08/25/2011  . COPD (chronic obstructive pulmonary disease) ([facility]) 08/25/2011  . CKD (chronic kidney disease), stage III ([facility]) 12/07/2010  . Chest pain 08/18/2010  . Abdominal pain 08/18/2010  . Syncope   . Tobacco abuse, in remission   . Degenerative joint disease of knee, left   . COLONIC POLYPS 05/20/2009  . Hyperlipidemia 05/20/2009  . ANEMIA 05/20/2009  . Essential tremor 05/20/2009  . ATHEROSCLEROTIC CARDIOVASCULAR DISEASE 05/20/2009  . CEREBROVASCULAR DISEASE 05/20/2009  . Essential hypertension 01/14/2009    Valentino Saxon, PT, DPT Physical Therapist with [facility]  11/19/2017 6:48 PM    [facility] [address] [place], [place], [address] Phone: [phone]   Fax:  [phone]  Name: MAJER BANSAL MRN: [id number] Date of Birth: 1921-09-25

## 2017-11-22 ENCOUNTER — Ambulatory Visit (HOSPITAL_COMMUNITY): Payer: Medicare Other

## 2017-11-22 ENCOUNTER — Other Ambulatory Visit: Payer: Self-pay

## 2017-11-22 ENCOUNTER — Encounter (HOSPITAL_COMMUNITY): Payer: Self-pay

## 2017-11-22 ENCOUNTER — Ambulatory Visit (HOSPITAL_COMMUNITY)
Admission: RE | Admit: 2017-11-22 | Discharge: 2017-11-22 | Disposition: A | Discharge status: Home / Self Care | Payer: Medicare Other | Source: Ambulatory Visit | Attending: Family Medicine | Admitting: Family Medicine

## 2017-11-22 DIAGNOSIS — J181 Lobar pneumonia, unspecified organism: Secondary | ICD-10-CM | POA: Insufficient documentation

## 2017-11-22 DIAGNOSIS — R29818 Other symptoms and signs involving the nervous system: Secondary | ICD-10-CM

## 2017-11-22 DIAGNOSIS — R296 Repeated falls: Secondary | ICD-10-CM

## 2017-11-22 DIAGNOSIS — J449 Chronic obstructive pulmonary disease, unspecified: Secondary | ICD-10-CM | POA: Insufficient documentation

## 2017-11-22 DIAGNOSIS — J189 Pneumonia, unspecified organism: Secondary | ICD-10-CM

## 2017-11-22 NOTE — Therapy (Signed)
[place], [place], [address] Phone: [phone]   Fax:  [phone]  Physical Therapy Treatment  Patient Details  Name: [name] MRN: [id number] Date of Birth: 01/07/1922 Referring Provider: Sallee Lange   Encounter Date: 11/22/2017  PT End of Session - 11/22/17 1038    Visit Number  4    Number of Visits  24    Date for PT Re-Evaluation  12/22/17    Authorization Type  [employer] Medicare    Authorization Time Period  10/21/17-12/22/17 (reassessment on 11/21/17)    Authorization - Visit Number  4    Authorization - Number of Visits  10    PT Start Time  1033    PT Stop Time  1114    PT Time Calculation (min)  41 min    Activity Tolerance  Patient tolerated treatment well    Behavior During Therapy  [facility] for tasks assessed/performed       Past Medical History:  Diagnosis Date  . ASCVD (arteriosclerotic cardiovascular disease)     CABG in 04/1993; and negative stress nuclear study in 08/2001  . Benign essential tremor   . CAD (coronary artery disease)   . Cancer ([place])    skin  . Cerebrovascular disease    Right carotid bruit-40-69% left internal carotid artery stenosis in 4/06; followed [facility]  . Chronic kidney disease, stage II (mild)    Creatinine-1.6 in 09/2008  . COPD (chronic obstructive pulmonary disease) ([place])   . Degenerative joint disease of knee, left   . Gout   . Hiatal hernia   . Horseshoe kidney   . Hyperlipidemia   . Hypertension   . Lung cancer ([place]) 2013  . Normocytic anemia   . Prediabetes   . Pulmonary disease   . Renal insufficiency   . Syncope   . Tobacco abuse, in remission    40 pack years; discontinued in 1980    Past Surgical History:  Procedure Laterality Date  . CARDIAC SURGERY    . COLONOSCOPY W/ POLYPECTOMY  1985  . CORONARY ARTERY BYPASS GRAFT  1995  . LESION EXCISION    . PILONIDAL CYST EXCISION  1948    There were no vitals filed for this visit.  Subjective  Assessment - 11/22/17 1038    Subjective  Patient arrives with son and reports he is feeling good today an had a good weekend. He reports he has not tried the new PWR! exercises at home yet. His son staes teh patient's oxygen was low on Saturday and he slept alot so he brought hi pulse oximeter to monitor SpO2 levels.    Patient is accompained by:  Family member    Pertinent History  Falls history prior to this year but less injurious, son estimates 10-15 falls in past 5 years. Parkinsons has been diagnosed for about 10 years. Some tremors, some mild freezing, Son suspects carbidopa is not making much difference in symptoms.     How long can you sit comfortably?  none    How long can you stand comfortably?  none    How long can you walk comfortably?  5 minutes, with rollator (will get out and walk to church twice weekly)     Patient Stated Goals  fall less frequently.     Currently in Pain?  No/denies        [facility] Adult PT Treatment/Exercise - 11/22/17 0001      Knee/Hip Exercises: Standing  Other Standing Knee Exercises  Wall arch: 2x 10 reps, counting 1, 2, 3 to maintain steady timing and full stretch       PWR [facility]) - 11/22/17 1121    PWR! Up  10 reps scarf toss; with ipsilateral reach and catch for timing, pt's son tossed scarf while therapist provided min guard    PWR! Step Through Forward/Back  2x 10 reps bil directions, 6" hurdle with reach for cone from contralatearl UE    Reaching Comments  2x 10 reps bil UE with boom wackers for ipsilateral reaching; seated    PWR! Up  2 x 10 reps, counting out loud "1, 2, 3," for each position    PWR! Rock  2 x 10 reps, counting out loud "1, 2, 3," for each position no leg extension in this move currently    PWR! Twist  2x 10 reps bil, with boom wackers for contralateral reaching at various heights        PT Education - 11/22/17 1038    Education provided  Yes    Education Details  Educated on PWR! exercises from last session and on new  exercises for timing.    Person(s) Educated  Patient    Methods  Explanation    Comprehension  Verbalized understanding       PT Short Term Goals - 11/15/17 1113      PT SHORT TERM GOAL #1   Title  At 3 weeks pt/son independent in dynamic balance/LSVT style home program to improve falls recovery.     Status  On-going      PT SHORT TERM GOAL #2   Title  At 4 weeks pt will demonstrate tolerance of 624ft AMB without LLE pain limitations at >0.1m/s.     Status  On-going      PT SHORT TERM GOAL #3   Title  At 4 weeks patient will demonstrate BBT> 42    Status  On-going      PT SHORT TERM GOAL #4   Status  On-going        PT Long Term Goals - 11/15/17 1113      PT LONG TERM GOAL #1   Title  At 8 weeks pt will be independent in home based LE/UE strengthening program to improve falls recovery.     Status  On-going      PT LONG TERM GOAL #2   Title  At 8 weeks patient will demonstrate BBT socre> 46/56.     Status  On-going      PT LONG TERM GOAL #3   Title  At 8 weeks pt will demonstrate tolerance of 964ft AMB c 4WW at >0.20m/s to improve ability to access the community safely.     Status  On-going        Plan - 11/22/17 1117    Clinical Impression Statement  Continued with PWR! moves in sitting today and some standing. Reviewed exercises for at home and added boom whackers reaching and twist exercise as well as scarf catch for motor timing. Patient maintain SpO2 levels of 96% or greater throughout session. Counting introduced for PWR! exercises as auditory cue for sequencing/timing and patient had improved pace throughout exercises resulting in improved movement quality. Wall arch initiated today for posture and patient required verbal cue of counting to pace and prevent LOB. He will continue to benefit from skilled PT intervention to address strength, balance, and motor planning deficits to reduce fall risk and improve functional mobility.  Rehab Potential  Good    PT  Frequency  --   3x/w for 2 weeks-> 2x/w for 4 weeks, then 1x/w for 2 weeks.    PT Duration  8 weeks    PT Treatment/Interventions  ADLs/Self Care Home Management;Electrical Stimulation;Moist Heat;DME Instruction;Gait training;Stair training;Functional mobility training;Therapeutic activities;Therapeutic exercise;Balance training;Neuromuscular re-education;Patient/family education;Manual techniques;Passive range of motion;Dry needling;Taping;Energy conservation;Cognitive remediation;Cryotherapy    PT Next Visit Plan  Continue to progress basic dynamic balance program for home that Son can assist with, include basic chair based exercise program that patient can perform independently. Continue with PWR! exercises and functional strengthening for posture. Focus on timing/motor control. Add music for timing next session.    PT Home Exercise Plan  Seated marching, PWR! Up and Reach,     Consulted and Agree with Plan of Care  Family member/caregiver;Patient    Family Member Consulted  Son       Patient will benefit from skilled therapeutic intervention in order to improve the following deficits and impairments:  Abnormal gait, Cardiopulmonary status limiting activity, Decreased activity tolerance, Decreased balance, Decreased coordination, Decreased endurance, Decreased knowledge of use of DME, Decreased strength, Difficulty walking, Hypomobility, Increased muscle spasms, Impaired perceived functional ability, Impaired flexibility, Improper body mechanics, Postural dysfunction  Visit Diagnosis: Repeated falls  Other symptoms and signs involving the nervous system     Problem List Patient Active Problem List   Diagnosis Date Noted  . Lobar pneumonia, unspecified organism ([place]) 10/29/2017  . Pleural effusion, right   . Pedal edema 05/14/2017  . Parkinson's disease ([place]) 10/05/2016  . Diastolic CHF, chronic ([place]) 05/10/2015  . Dizziness   . Coronary artery disease involving coronary bypass graft  of native heart without angina pectoris   . Near syncope 04/02/2015  . Diarrhea 04/02/2015  . COPD exacerbation ([place]) 06/04/2013  . Shortness of breath 06/04/2013  . Acute respiratory failure with hypoxia ([place]) 06/04/2013  . Hemoptysis 08/10/2012  . Prediabetes 08/28/2011  . Lung cancer ([place]) 08/25/2011  . COPD (chronic obstructive pulmonary disease) ([place]) 08/25/2011  . CKD (chronic kidney disease), stage III ([place]) 12/07/2010  . Chest pain 08/18/2010  . Abdominal pain 08/18/2010  . Syncope   . Tobacco abuse, in remission   . Degenerative joint disease of knee, left   . COLONIC POLYPS 05/20/2009  . Hyperlipidemia 05/20/2009  . ANEMIA 05/20/2009  . Essential tremor 05/20/2009  . ATHEROSCLEROTIC CARDIOVASCULAR DISEASE 05/20/2009  . CEREBROVASCULAR DISEASE 05/20/2009  . Essential hypertension 01/14/2009    Kipp Brood, PT, DPT Physical Therapist with [facility]  11/22/2017 11:29 AM    [place], [place], [address] Phone: [phone]   Fax:  [phone]  Name: BARLOW HARRISON MRN: [id number] Date of Birth: [birth year]

## 2017-11-24 ENCOUNTER — Other Ambulatory Visit: Payer: Self-pay

## 2017-11-24 ENCOUNTER — Encounter (HOSPITAL_COMMUNITY): Payer: Self-pay

## 2017-11-24 ENCOUNTER — Ambulatory Visit (HOSPITAL_COMMUNITY): Payer: Medicare Other

## 2017-11-24 DIAGNOSIS — R296 Repeated falls: Secondary | ICD-10-CM | POA: Diagnosis not present

## 2017-11-24 DIAGNOSIS — R29818 Other symptoms and signs involving the nervous system: Secondary | ICD-10-CM

## 2017-11-24 MED ORDER — AZITHROMYCIN 250 MG PO TABS: 250.0000 mg | ORAL_TABLET | Freq: Every day | ORAL | 0 refills | Status: DC

## 2017-11-24 NOTE — Therapy (Signed)
[place], [place], [address] Phone: [phone]   Fax:  [phone]  Physical Therapy Treatment  Patient Details  Name: [name] MRN: [id number] Date of Birth: Oct 06, 1921 Referring Provider: Sallee Lange   Encounter Date: 11/24/2017  PT End of Session - 11/24/17 1111    Visit Number  5    Number of Visits  24    Date for PT Re-Evaluation  12/22/17    Authorization Type  [employer] Medicare    Authorization Time Period  10/21/17-12/22/17 (reassessment on 11/21/17)    Authorization - Visit Number  5    Authorization - Number of Visits  10    PT Start Time  1034    PT Stop Time  1115    PT Time Calculation (min)  41 min    Activity Tolerance  Patient tolerated treatment well    Behavior During Therapy  [facility] for tasks assessed/performed       Past Medical History:  Diagnosis Date  . ASCVD (arteriosclerotic cardiovascular disease)     CABG in 04/1993; and negative stress nuclear study in 08/2001  . Benign essential tremor   . CAD (coronary artery disease)   . Cancer ([place])    skin  . Cerebrovascular disease    Right carotid bruit-40-69% left internal carotid artery stenosis in 4/06; followed [facility]  . Chronic kidney disease, stage II (mild)    Creatinine-1.6 in 09/2008  . COPD (chronic obstructive pulmonary disease) ([place])   . Degenerative joint disease of knee, left   . Gout   . Hiatal hernia   . Horseshoe kidney   . Hyperlipidemia   . Hypertension   . Lung cancer ([place]) 2013  . Normocytic anemia   . Prediabetes   . Pulmonary disease   . Renal insufficiency   . Syncope   . Tobacco abuse, in remission    40 pack years; discontinued in 1980    Past Surgical History:  Procedure Laterality Date  . CARDIAC SURGERY    . COLONOSCOPY W/ POLYPECTOMY  1985  . CORONARY ARTERY BYPASS GRAFT  1995  . LESION EXCISION    . PILONIDAL CYST EXCISION  1948    There were no vitals filed for this visit.  Subjective  Assessment - 11/24/17 1301    Subjective  Patient arrives with son and states he is well today. Reports he tried the seated PWR! exercises yesterday and he and his son states they went well. He denies pain.    Patient is accompained by:  Family member    Pertinent History  Falls history prior to this year but less injurious, son estimates 10-15 falls in past 5 years. Parkinsons has been diagnosed for about 10 years. Some tremors, some mild freezing, Son suspects carbidopa is not making much difference in symptoms.     How long can you sit comfortably?  none    How long can you stand comfortably?  none    How long can you walk comfortably?  5 minutes, with rollator (will get out and walk to church twice weekly)     Patient Stated Goals  fall less frequently.     Currently in Pain?  No/denies       [facility] Adult PT Treatment/Exercise - 11/24/17 0001      Exercises   Exercises  Knee/Hip      Knee/Hip Exercises: Seated   Other Seated Knee/Hip Exercises  heel/toe raises 2x10  PWR [facility]) - 11/24/17 1052    PWR Step  2x 10 reps bil; step to colored dot and hit boom whacker on ipsilateral side    PWR! Step Through Forward/Back  2x 10 reps bil directions, 6" hurdle with reach for cone from contralatearl UE    PWR! Up  2 x 10 reps, counting out loud "1, 2, 3," for each position       Balance Exercises - 11/24/17 1040      Balance Exercises: Standing   Tandem Stance  2 reps;30 secs    Stepping Strategy  Anterior;Posterior;Lateral;5 reps   bil LE, 3 colored spots on floor, step to and back       PT Education - 11/24/17 1304    Education provided  Yes    Education Details  Educated on dynamic standing balance activities with stepping strategies. Educated on [employer]! exercises throughotu and on importance of rest breaks.    Person(s) Educated  Patient;Child(ren)    Methods  Explanation;Tactile cues;Verbal cues    Comprehension  Verbalized understanding;Returned demonstration       PT  Short Term Goals - 11/15/17 1113      PT SHORT TERM GOAL #1   Title  At 3 weeks pt/son independent in dynamic balance/LSVT style home program to improve falls recovery.     Status  On-going      PT SHORT TERM GOAL #2   Title  At 4 weeks pt will demonstrate tolerance of 653ft AMB without LLE pain limitations at >0.48m/s.     Status  On-going      PT SHORT TERM GOAL #3   Title  At 4 weeks patient will demonstrate BBT> 42    Status  On-going      PT SHORT TERM GOAL #4   Status  On-going        PT Long Term Goals - 11/15/17 1113      PT LONG TERM GOAL #1   Title  At 8 weeks pt will be independent in home based LE/UE strengthening program to improve falls recovery.     Status  On-going      PT LONG TERM GOAL #2   Title  At 8 weeks patient will demonstrate BBT socre> 46/56.     Status  On-going      PT LONG TERM GOAL #3   Title  At 8 weeks pt will demonstrate tolerance of 933ft AMB c 4WW at >0.89m/s to improve ability to access the community safely.     Status  On-going         Plan - 11/24/17 1112    Clinical Impression Statement  Continued to progress to standing PWR! move exercises. Patient performed PWR! Step in seated with contralateral cones reaches today and required fewer cues to sequence movement, as well as demonstrated improved amplitude of movement. Standing PWR! step was performed with ipsilateral reaching to hit boom whackers and patient required more verbal/tactile cues to sequence/time step and reaching as separate movements. Patient had one instance of SpO2 levels dropping below 80% after performing seated toe raises, but it improved to 98% within 30 seconds of resting and focus on breathing. Throughout majority of session, SpO2 maintained at or above 96%. He will continue to benefit from skilled PT intervention to address strength, balance, and motor planning deficits to reduce fall risk and improve functional mobility.    Rehab Potential  Good    PT Frequency  --    3x/w for 2  weeks-> 2x/w for 4 weeks, then 1x/w for 2 weeks.    PT Duration  8 weeks    PT Treatment/Interventions  ADLs/Self Care Home Management;Electrical Stimulation;Moist Heat;DME Instruction;Gait training;Stair training;Functional mobility training;Therapeutic activities;Therapeutic exercise;Balance training;Neuromuscular re-education;Patient/family education;Manual techniques;Passive range of motion;Dry needling;Taping;Energy conservation;Cognitive remediation;Cryotherapy    PT Next Visit Plan  Continue to progress basic dynamic balance program for home that Son can assist with, include basic chair based exercise program that patient can perform independently. Continue with PWR! exercises and functional strengthening for posture. Continue with heel/toe raises for gait. Focus on timing/motor control. Add music for timing next session.    PT Home Exercise Plan  Seated marching, PWR! Up and Reach,     Consulted and Agree with Plan of Care  Family member/caregiver;Patient    Family Member Consulted  Son       Patient will benefit from skilled therapeutic intervention in order to improve the following deficits and impairments:  Abnormal gait, Cardiopulmonary status limiting activity, Decreased activity tolerance, Decreased balance, Decreased coordination, Decreased endurance, Decreased knowledge of use of DME, Decreased strength, Difficulty walking, Hypomobility, Increased muscle spasms, Impaired perceived functional ability, Impaired flexibility, Improper body mechanics, Postural dysfunction  Visit Diagnosis: Repeated falls  Other symptoms and signs involving the nervous system     Problem List Patient Active Problem List   Diagnosis Date Noted  . Lobar pneumonia, unspecified organism ([place]) 10/29/2017  . Pleural effusion, right   . Pedal edema 05/14/2017  . Parkinson's disease ([place]) 10/05/2016  . Diastolic CHF, chronic ([place]) 05/10/2015  . Dizziness   . Coronary artery disease  involving coronary bypass graft of native heart without angina pectoris   . Near syncope 04/02/2015  . Diarrhea 04/02/2015  . COPD exacerbation ([place]) 06/04/2013  . Shortness of breath 06/04/2013  . Acute respiratory failure with hypoxia ([place]) 06/04/2013  . Hemoptysis 08/10/2012  . Prediabetes 08/28/2011  . Lung cancer ([place]) 08/25/2011  . COPD (chronic obstructive pulmonary disease) ([place]) 08/25/2011  . CKD (chronic kidney disease), stage III ([place]) 12/07/2010  . Chest pain 08/18/2010  . Abdominal pain 08/18/2010  . Syncope   . Tobacco abuse, in remission   . Degenerative joint disease of knee, left   . COLONIC POLYPS 05/20/2009  . Hyperlipidemia 05/20/2009  . ANEMIA 05/20/2009  . Essential tremor 05/20/2009  . ATHEROSCLEROTIC CARDIOVASCULAR DISEASE 05/20/2009  . CEREBROVASCULAR DISEASE 05/20/2009  . Essential hypertension 01/14/2009    Kipp Brood, PT, DPT Physical Therapist with [facility]  11/24/2017 4:17 PM    [place], [place], [address] Phone: [phone]   Fax:  [phone]  Name: [name] MRN: [id number] Date of Birth: 05-29-1921

## 2017-11-29 ENCOUNTER — Ambulatory Visit (HOSPITAL_COMMUNITY): Payer: Medicare Other

## 2017-11-29 ENCOUNTER — Encounter (HOSPITAL_COMMUNITY): Payer: Self-pay

## 2017-11-29 ENCOUNTER — Other Ambulatory Visit: Payer: Self-pay | Admitting: Family Medicine

## 2017-11-29 DIAGNOSIS — R29818 Other symptoms and signs involving the nervous system: Secondary | ICD-10-CM

## 2017-11-29 DIAGNOSIS — R296 Repeated falls: Secondary | ICD-10-CM

## 2017-11-29 NOTE — Patient Instructions (Signed)
Access Code: [email or web address]  URL: [email or web address]  Date: 11/29/2017  Prepared by: Geraldine Solar   Exercises Seated Heel Toe Raises - 10 reps - 3 sets - 1x daily - 7x weekly Seated Long Arc Quad - 10 reps - 3 sets - 1x daily - 7x weekly Standing Knee Flexion - 10 reps - 3 sets - 1x daily - 7x weekly

## 2017-11-29 NOTE — Therapy (Signed)
[place], [place], [address] Phone: [phone]   Fax:  [phone]  Physical Therapy Treatment  Patient Details  Name: [name] MRN: [id number] Date of Birth: [birth year] Referring Provider: Sallee Lange   Encounter Date: 11/29/2017  PT End of Session - 11/29/17 1031    Visit Number  6    Number of Visits  24    Date for PT Re-Evaluation  12/22/17    Authorization Type  [employer] Medicare    Authorization Time Period  10/21/17-12/22/17 (reassessment on 11/21/17)    Authorization - Visit Number  6    Authorization - Number of Visits  10    PT Start Time  1030    PT Stop Time  1109    PT Time Calculation (min)  39 min    Activity Tolerance  Patient tolerated treatment well    Behavior During Therapy  [facility] for tasks assessed/performed       Past Medical History:  Diagnosis Date  . ASCVD (arteriosclerotic cardiovascular disease)     CABG in 04/1993; and negative stress nuclear study in 08/2001  . Benign essential tremor   . CAD (coronary artery disease)   . Cancer ([place])    skin  . Cerebrovascular disease    Right carotid bruit-40-69% left internal carotid artery stenosis in 4/06; followed [facility]  . Chronic kidney disease, stage II (mild)    Creatinine-1.6 in 09/2008  . COPD (chronic obstructive pulmonary disease) ([place])   . Degenerative joint disease of knee, left   . Gout   . Hiatal hernia   . Horseshoe kidney   . Hyperlipidemia   . Hypertension   . Lung cancer ([place]) 2013  . Normocytic anemia   . Prediabetes   . Pulmonary disease   . Renal insufficiency   . Syncope   . Tobacco abuse, in remission    40 pack years; discontinued in 1980    Past Surgical History:  Procedure Laterality Date  . CARDIAC SURGERY    . COLONOSCOPY W/ POLYPECTOMY  1985  . CORONARY ARTERY BYPASS GRAFT  1995  . LESION EXCISION    . PILONIDAL CYST EXCISION  1948    There were no vitals filed for this visit.  Subjective  Assessment - 11/29/17 1031    Subjective  Pt and his son states they had a good weekend. His son states that his chest x-ray last Monday showed that he still had some pneumonia so he was put on another 5-day antibiotic and it stopped yesterday was his last day of taking it.     Patient is accompained by:  Family member    Pertinent History  Falls history prior to this year but less injurious, son estimates 10-15 falls in past 5 years. Parkinsons has been diagnosed for about 10 years. Some tremors, some mild freezing, Son suspects carbidopa is not making much difference in symptoms.     How long can you sit comfortably?  none    How long can you stand comfortably?  none    How long can you walk comfortably?  5 minutes, with rollator (will get out and walk to church twice weekly)     Patient Stated Goals  fall less frequently.     Currently in Pain?  No/denies             [facility] Adult PT Treatment/Exercise - 11/29/17 0001      Exercises   Exercises  Knee/Hip  Knee/Hip Exercises: Standing   Knee Flexion  Both;2 sets;10 reps    Hip Flexion  Both;2 sets;10 reps    Hip Flexion Limitations  marching, BUE support on // bars      Knee/Hip Exercises: Seated   Long Arc Quad  2 sets;10 reps    Other Seated Knee/Hip Exercises  heel/toe raises x20      PWR ([facility]) - 11/29/17 1045    PWR Step  2x15 reps bil; step to colored dot and hit boom whacker on ipsilateral side    PWR! Step Through Forward/Back  in sitting and stancing -- 2x10 reps sitting (1x5 reps standing) bil directions, 6" hurdle with reach for cone      Balance Exercises - 11/29/17 1102      Balance Exercises: Standing   Standing Eyes Closed  Narrow base of support (BOS);Solid surface;3 reps;10 secs    Tandem Stance  Intermittent upper extremity support   +R/L head turns x10 reps each   Other Standing Exercises  bil tandem stance +UE flexion with PVC pipe x10 reps each            PT Education - 11/29/17 1031     Education provided  Yes    Education Details  updated HEP    Person(s) Educated  Patient;Child(ren)    Methods  Explanation;Demonstration;Verbal cues    Comprehension  Verbalized understanding;Returned demonstration       PT Short Term Goals - 11/15/17 1113      PT SHORT TERM GOAL #1   Title  At 3 weeks pt/son independent in dynamic balance/LSVT style home program to improve falls recovery.     Status  On-going      PT SHORT TERM GOAL #2   Title  At 4 weeks pt will demonstrate tolerance of 657ft AMB without LLE pain limitations at >0.110m/s.     Status  On-going      PT SHORT TERM GOAL #3   Title  At 4 weeks patient will demonstrate BBT> 42    Status  On-going      PT SHORT TERM GOAL #4   Status  On-going        PT Long Term Goals - 11/15/17 1113      PT LONG TERM GOAL #1   Title  At 8 weeks pt will be independent in home based LE/UE strengthening program to improve falls recovery.     Status  On-going      PT LONG TERM GOAL #2   Title  At 8 weeks patient will demonstrate BBT socre> 46/56.     Status  On-going      PT LONG TERM GOAL #3   Title  At 8 weeks pt will demonstrate tolerance of 961ft AMB c 4WW at >0.8m/s to improve ability to access the community safely.     Status  On-going            Plan - 11/29/17 1112    Clinical Impression Statement  Continued with estabalished POC focusing on functional strengthening, balance, coordination, and power. Pt slightly limited dueto subjective fatigue of LLE and cardiovascular endurance/breathing, however, his O2 sats remained >95% throughout the entire session. Pt challenged with standing PWR! step today and wished to stop after 5 reps. Min cues required for proper PWR! technique today. Progressed pt to head turns in tandem stance and he demo'd increased difficulty with LLE back. No pain reported at EOS, just LLE fatigue. Continue as planned, progressing as able.  Rehab Potential  Good    PT Frequency  --   3x/w for  2 weeks-> 2x/w for 4 weeks, then 1x/w for 2 weeks.    PT Duration  8 weeks    PT Treatment/Interventions  ADLs/Self Care Home Management;Electrical Stimulation;Moist Heat;DME Instruction;Gait training;Stair training;Functional mobility training;Therapeutic activities;Therapeutic exercise;Balance training;Neuromuscular re-education;Patient/family education;Manual techniques;Passive range of motion;Dry needling;Taping;Energy conservation;Cognitive remediation;Cryotherapy    PT Next Visit Plan  Add music for timing next session. Continue to progress basic dynamic balance program for home that Son can assist with, include basic chair based exercise program that patient can perform independently. Continue with PWR! exercises and functional strengthening for posture. Continue with heel/toe raises for gait. Focus on timing/motor control.    PT Home Exercise Plan  Seated marching, PWR! Up and Reach; 8/19: seated heel and toe, LAQ, standing knee flexion    Consulted and Agree with Plan of Care  Family member/caregiver;Patient    Family Member Consulted  Son       Patient will benefit from skilled therapeutic intervention in order to improve the following deficits and impairments:  Abnormal gait, Cardiopulmonary status limiting activity, Decreased activity tolerance, Decreased balance, Decreased coordination, Decreased endurance, Decreased knowledge of use of DME, Decreased strength, Difficulty walking, Hypomobility, Increased muscle spasms, Impaired perceived functional ability, Impaired flexibility, Improper body mechanics, Postural dysfunction  Visit Diagnosis: Repeated falls  Other symptoms and signs involving the nervous system     Problem List Patient Active Problem List   Diagnosis Date Noted  . Lobar pneumonia, unspecified organism ([place]) 10/29/2017  . Pleural effusion, right   . Pedal edema 05/14/2017  . Parkinson's disease ([place]) 10/05/2016  . Diastolic CHF, chronic ([place]) 05/10/2015  .  Dizziness   . Coronary artery disease involving coronary bypass graft of native heart without angina pectoris   . Near syncope 04/02/2015  . Diarrhea 04/02/2015  . COPD exacerbation ([place]) 06/04/2013  . Shortness of breath 06/04/2013  . Acute respiratory failure with hypoxia ([place]) 06/04/2013  . Hemoptysis 08/10/2012  . Prediabetes 08/28/2011  . Lung cancer ([place]) 08/25/2011  . COPD (chronic obstructive pulmonary disease) ([place]) 08/25/2011  . CKD (chronic kidney disease), stage III ([place]) 12/07/2010  . Chest pain 08/18/2010  . Abdominal pain 08/18/2010  . Syncope   . Tobacco abuse, in remission   . Degenerative joint disease of knee, left   . COLONIC POLYPS 05/20/2009  . Hyperlipidemia 05/20/2009  . ANEMIA 05/20/2009  . Essential tremor 05/20/2009  . ATHEROSCLEROTIC CARDIOVASCULAR DISEASE 05/20/2009  . CEREBROVASCULAR DISEASE 05/20/2009  . Essential hypertension 01/14/2009       Geraldine Solar PT, DPT  [place] [address] [place], [place], [address] Phone: [phone]   Fax:  [phone]  Name: [name] MRN: [id number] Date of Birth: Jul 31, 1921

## 2017-12-03 ENCOUNTER — Ambulatory Visit (HOSPITAL_COMMUNITY): Payer: Medicare Other

## 2017-12-03 ENCOUNTER — Encounter (HOSPITAL_COMMUNITY): Payer: Self-pay

## 2017-12-03 DIAGNOSIS — R296 Repeated falls: Secondary | ICD-10-CM | POA: Diagnosis not present

## 2017-12-03 DIAGNOSIS — R29818 Other symptoms and signs involving the nervous system: Secondary | ICD-10-CM

## 2017-12-03 NOTE — Therapy (Signed)
[place], [place], [address] Phone: [phone]   Fax:  [phone]  Physical Therapy Treatment  Patient Details  Name: [name] MRN: [id number] Date of Birth: 01-26-1922 Referring Provider: Sallee Lange   Encounter Date: 12/03/2017  PT End of Session - 12/03/17 1117    Visit Number  7    Number of Visits  24    Date for PT Re-Evaluation  12/22/17    Authorization Type  [employer] Medicare    Authorization Time Period  10/21/17-12/22/17 (reassessment on 11/21/17)    Authorization - Visit Number  7    Authorization - Number of Visits  10    PT Start Time  1036    PT Stop Time  1117    PT Time Calculation (min)  41 min    Equipment Utilized During Treatment  Gait belt    Activity Tolerance  Patient tolerated treatment well    Behavior During Therapy  [facility] for tasks assessed/performed       Past Medical History:  Diagnosis Date  . ASCVD (arteriosclerotic cardiovascular disease)     CABG in 04/1993; and negative stress nuclear study in 08/2001  . Benign essential tremor   . CAD (coronary artery disease)   . Cancer ([place])    skin  . Cerebrovascular disease    Right carotid bruit-40-69% left internal carotid artery stenosis in 4/06; followed [facility]  . Chronic kidney disease, stage II (mild)    Creatinine-1.6 in 09/2008  . COPD (chronic obstructive pulmonary disease) ([place])   . Degenerative joint disease of knee, left   . Gout   . Hiatal hernia   . Horseshoe kidney   . Hyperlipidemia   . Hypertension   . Lung cancer ([place]) 2013  . Normocytic anemia   . Prediabetes   . Pulmonary disease   . Renal insufficiency   . Syncope   . Tobacco abuse, in remission    40 pack years; discontinued in 1980    Past Surgical History:  Procedure Laterality Date  . CARDIAC SURGERY    . COLONOSCOPY W/ POLYPECTOMY  1985  . CORONARY ARTERY BYPASS GRAFT  1995  . LESION EXCISION    . PILONIDAL CYST EXCISION  1948    There were no  vitals filed for this visit.  Subjective Assessment - 12/03/17 1039    Subjective  Pt and his sore stated they are having a good day.  Reports they have completed HEP a couple times since last tx.  Done with antibiotics, does continue to have a wet cough.    Pertinent History  Falls history prior to this year but less injurious, son estimates 10-15 falls in past 5 years. Parkinsons has been diagnosed for about 10 years. Some tremors, some mild freezing, Son suspects carbidopa is not making much difference in symptoms.     Patient Stated Goals  fall less frequently.     Currently in Pain?  No/denies                       [facility] Adult PT Treatment/Exercise - 12/03/17 0001      Ambulation/Gait   Ambulation Distance (Feet)  339 Feet    Assistive device  Straight cane    Gait Comments  use of metronome 2 point sequence 63 adagio      Exercises   Exercises  Knee/Hip      Knee/Hip Exercises: Standing   Heel Raises  Both;2 sets;10 reps    Heel Raises Limitations  Toe raises        PWR [facility]) - 12/03/17 1118    PWR! exercises  Moves in sitting;Moves in standing    PWR Step         [facility]) - 11/24/17 1052    PWR Step  2x 10 reps bil; step to colored dot and hit boom whacker on ipsilateral side then contralateral   PWR! Step Through Forward/Back  2x 10 reps bil directions, 6" hurdle with reach for cone from contralatearl UE    PWR! Up  2 x 10 reps, counting out loud "1, 2, 3," for each position        Balance Exercises - 12/03/17 1108      Balance Exercises: Standing   Tandem Stance  Intermittent upper extremity support   head turns Rt/Lt   Sidestepping  4 reps;Limitations   in // bars with decreased HHA   Step Over Hurdles / Cones  6in hurdles 4RT with decreased HHA and alternating motions last set; forward and sidestep    Marching Limitations  10x 5" with intermittent HHA          PT Short Term Goals - 11/15/17 1113      PT SHORT TERM GOAL #1    Title  At 3 weeks pt/son independent in dynamic balance/LSVT style home program to improve falls recovery.     Status  On-going      PT SHORT TERM GOAL #2   Title  At 4 weeks pt will demonstrate tolerance of 651ft AMB without LLE pain limitations at >0.52m/s.     Status  On-going      PT SHORT TERM GOAL #3   Title  At 4 weeks patient will demonstrate BBT> 42    Status  On-going      PT SHORT TERM GOAL #4   Status  On-going        PT Long Term Goals - 11/15/17 1113      PT LONG TERM GOAL #1   Title  At 8 weeks pt will be independent in home based LE/UE strengthening program to improve falls recovery.     Status  On-going      PT LONG TERM GOAL #2   Title  At 8 weeks patient will demonstrate BBT socre> 46/56.     Status  On-going      PT LONG TERM GOAL #3   Title  At 8 weeks pt will demonstrate tolerance of 950ft AMB c 4WW at >0.66m/s to improve ability to access the community safely.     Status  On-going            Plan - 12/03/17 1127    Clinical Impression Statement  Continued with establised POC focus on functional strengthening, balance, coordination and power.  Pt. slightly limited by fatigue requiring seated rest breaks and noted wheezing through session.  Son reports they are done with antibiotics for pneunomia though continues to have wheezing and reoprts of moist coughing.  Min A for safety wth standing PWR! technqiues today.  Added metronome with gait with SPC 2 point sequence, pt able to demonstrate good sequence with cane though does continue to stagger gait.  Added sidestepping and forward/lateral hurdles for hip strengthening to assist.  No reports of pain at EOS, was limited by fatigue with Lt LE.    Rehab Potential  Good    PT Frequency  --   3x/w  for 2 weeks-> 2x/w for 4 weeks, then 1x/w for 2 weeks.    PT Duration  8 weeks    PT Treatment/Interventions  ADLs/Self Care Home Management;Electrical Stimulation;Moist Heat;DME Instruction;Gait training;Stair  training;Functional mobility training;Therapeutic activities;Therapeutic exercise;Balance training;Neuromuscular re-education;Patient/family education;Manual techniques;Passive range of motion;Dry needling;Taping;Energy conservation;Cognitive remediation;Cryotherapy    PT Next Visit Plan  Continue wiht music for timing next session. Continue to progress basic dynamic balance program for home that Son can assist with, include basic chair based exercise program that patient can perform independently. Continue with PWR! exercises and functional strengthening for posture. Continue with heel/toe raises for gait. Focus on timing/motor control.    PT Home Exercise Plan  Seated marching, PWR! Up and Reach; 8/19: seated heel and toe, LAQ, standing knee flexion       Patient will benefit from skilled therapeutic intervention in order to improve the following deficits and impairments:  Abnormal gait, Cardiopulmonary status limiting activity, Decreased activity tolerance, Decreased balance, Decreased coordination, Decreased endurance, Decreased knowledge of use of DME, Decreased strength, Difficulty walking, Hypomobility, Increased muscle spasms, Impaired perceived functional ability, Impaired flexibility, Improper body mechanics, Postural dysfunction  Visit Diagnosis: Repeated falls  Other symptoms and signs involving the nervous system     Problem List Patient Active Problem List   Diagnosis Date Noted  . Lobar pneumonia, unspecified organism ([place]) 10/29/2017  . Pleural effusion, right   . Pedal edema 05/14/2017  . Parkinson's disease ([place]) 10/05/2016  . Diastolic CHF, chronic ([place]) 05/10/2015  . Dizziness   . Coronary artery disease involving coronary bypass graft of native heart without angina pectoris   . Near syncope 04/02/2015  . Diarrhea 04/02/2015  . COPD exacerbation ([place]) 06/04/2013  . Shortness of breath 06/04/2013  . Acute respiratory failure with hypoxia ([place]) 06/04/2013  .  Hemoptysis 08/10/2012  . Prediabetes 08/28/2011  . Lung cancer ([place]) 08/25/2011  . COPD (chronic obstructive pulmonary disease) ([place]) 08/25/2011  . CKD (chronic kidney disease), stage III ([place]) 12/07/2010  . Chest pain 08/18/2010  . Abdominal pain 08/18/2010  . Syncope   . Tobacco abuse, in remission   . Degenerative joint disease of knee, left   . COLONIC POLYPS 05/20/2009  . Hyperlipidemia 05/20/2009  . ANEMIA 05/20/2009  . Essential tremor 05/20/2009  . ATHEROSCLEROTIC CARDIOVASCULAR DISEASE 05/20/2009  . CEREBROVASCULAR DISEASE 05/20/2009  . Essential hypertension 01/14/2009   Ihor Austin, [place]; [place]  Aldona Lento 12/03/2017, 12:56 PM  [facility] [address] [place], [place], [address] Phone: [phone]   Fax:  [phone]  Name: JACOB CICERO MRN: [id number] Date of Birth: Jun 22, 1921

## 2017-12-06 ENCOUNTER — Encounter (HOSPITAL_COMMUNITY): Payer: Self-pay

## 2017-12-06 ENCOUNTER — Other Ambulatory Visit: Payer: Self-pay

## 2017-12-06 ENCOUNTER — Ambulatory Visit (HOSPITAL_COMMUNITY): Payer: Medicare Other

## 2017-12-06 DIAGNOSIS — R296 Repeated falls: Secondary | ICD-10-CM

## 2017-12-06 DIAGNOSIS — R29818 Other symptoms and signs involving the nervous system: Secondary | ICD-10-CM

## 2017-12-06 NOTE — Therapy (Signed)
[place], [place], [address] Phone: [phone]   Fax:  [phone]  Physical Therapy Treatment  Patient Details  Name: [name] MRN: [id number] Date of Birth: 1922-01-05 Referring Provider: Sallee Lange   Encounter Date: 12/06/2017  PT End of Session - 12/06/17 1139    Visit Number  8    Number of Visits  24    Date for PT Re-Evaluation  12/22/17    Authorization Type  [employer] Medicare    Authorization Time Period  10/21/17-12/22/17 (reassessment on 11/21/17)    Authorization - Visit Number  8    Authorization - Number of Visits  10    PT Start Time  1118    PT Stop Time  1157    PT Time Calculation (min)  39 min    Equipment Utilized During Treatment  Gait belt    Activity Tolerance  Patient tolerated treatment well    Behavior During Therapy  [facility] for tasks assessed/performed       Past Medical History:  Diagnosis Date  . ASCVD (arteriosclerotic cardiovascular disease)     CABG in 04/1993; and negative stress nuclear study in 08/2001  . Benign essential tremor   . CAD (coronary artery disease)   . Cancer ([place])    skin  . Cerebrovascular disease    Right carotid bruit-40-69% left internal carotid artery stenosis in 4/06; followed [facility]  . Chronic kidney disease, stage II (mild)    Creatinine-1.6 in 09/2008  . COPD (chronic obstructive pulmonary disease) ([place])   . Degenerative joint disease of knee, left   . Gout   . Hiatal hernia   . Horseshoe kidney   . Hyperlipidemia   . Hypertension   . Lung cancer ([place]) 2013  . Normocytic anemia   . Prediabetes   . Pulmonary disease   . Renal insufficiency   . Syncope   . Tobacco abuse, in remission    40 pack years; discontinued in 1980    Past Surgical History:  Procedure Laterality Date  . CARDIAC SURGERY    . COLONOSCOPY W/ POLYPECTOMY  1985  . CORONARY ARTERY BYPASS GRAFT  1995  . LESION EXCISION    . PILONIDAL CYST EXCISION  1948    There were no  vitals filed for this visit.  Subjective Assessment - 12/06/17 1201    Subjective  Patient arrives with son and reports he is well. He states he is doing he exercises every day. He denies pain.    Pertinent History  Falls history prior to this year but less injurious, son estimates 10-15 falls in past 5 years. Parkinsons has been diagnosed for about 10 years. Some tremors, some mild freezing, Son suspects carbidopa is not making much difference in symptoms.     How long can you sit comfortably?  none    How long can you stand comfortably?  none    How long can you walk comfortably?  5 minutes, with rollator (will get out and walk to church twice weekly)     Patient Stated Goals  fall less frequently.     Currently in Pain?  No/denies        [facility] Adult PT Treatment/Exercise - 12/06/17 0001      Balance Poses: Yoga   Warrior I  4 reps;15 seconds;Limitations    Warrior I Limitations  2x Rt LE and 2 x Lt LE      Knee/Hip Exercises: Standing  Heel Raises  Both;2 sets;10 reps      Knee/Hip Exercises: Seated   Other Seated Knee/Hip Exercises  toe raises 2x 12 with 3 second holds        [facility]) - 12/06/17 1126    PWR Step  2x 10 reps bil; step over 6" hurdle and hit boom whacker on ipsilateral side    PWR! Up  2x 10    PWR! Rock  seated reach with contralateral UE for scarves, 2x 10 reps       Balance Exercises - 12/06/17 1133      Balance Exercises: Standing   Tandem Stance  Intermittent upper extremity support;4 reps   alternating foot align, 2x 10 reps UE flex with dowel   Sidestepping  2 reps;Limitations   2x RT in // bars 6" hurdle lateral step over   Step Over Hurdles / Cones  4x RT in // bars forward 6" hurdle step over        PT Education - 12/06/17 1202    Education provided  Yes    Education Details  Educated on exercises throughout and importance of rest breaks. Discussed remaining sessions and timeline for re-evaluation.    Person(s) Educated   Patient;Child(ren)    Methods  Explanation    Comprehension  Verbalized understanding       PT Short Term Goals - 11/15/17 1113      PT SHORT TERM GOAL #1   Title  At 3 weeks pt/son independent in dynamic balance/LSVT style home program to improve falls recovery.     Status  On-going      PT SHORT TERM GOAL #2   Title  At 4 weeks pt will demonstrate tolerance of 667ft AMB without LLE pain limitations at >0.22m/s.     Status  On-going      PT SHORT TERM GOAL #3   Title  At 4 weeks patient will demonstrate BBT> 42    Status  On-going      PT SHORT TERM GOAL #4   Status  On-going        PT Long Term Goals - 11/15/17 1113      PT LONG TERM GOAL #1   Title  At 8 weeks pt will be independent in home based LE/UE strengthening program to improve falls recovery.     Status  On-going      PT LONG TERM GOAL #2   Title  At 8 weeks patient will demonstrate BBT socre> 46/56.     Status  On-going      PT LONG TERM GOAL #3   Title  At 8 weeks pt will demonstrate tolerance of 92ft AMB c 4WW at >0.89m/s to improve ability to access the community safely.     Status  On-going        Plan - 12/06/17 1150    Clinical Impression Statement  Continued with functional balance training and PWR! based seated and standing exercises to improve coordination, balance, and strength. Patient was able to perform exercises with seated rest breaks throughout and maintain SpO2 at or above 97% today. He continued with hurdle step over drills and required min assistance to prevent LOB and encourage slow planned movements. He performed warrior 1 balance pose today and required cues for greater step length. He continues to deny pain during therapy but reports some Lt LE fatigue with standing exercises. He will continue to benefit from skilled PT interventions to address current deficits and progress mobility to  reduce fall risk.    Rehab Potential  Good    PT Frequency  --   3x/w for 2 weeks-> 2x/w for 4  weeks, then 1x/w for 2 weeks.    PT Duration  8 weeks    PT Treatment/Interventions  ADLs/Self Care Home Management;Electrical Stimulation;Moist Heat;DME Instruction;Gait training;Stair training;Functional mobility training;Therapeutic activities;Therapeutic exercise;Balance training;Neuromuscular re-education;Patient/family education;Manual techniques;Passive range of motion;Dry needling;Taping;Energy conservation;Cognitive remediation;Cryotherapy    PT Next Visit Plan  Continue with music for timing next session. Continue with PWR! exercises and functional strengthening for posture. Continue with heel/toe raises for gait. Focus on timing/motor control. Progress/update HEP for son to work on with patient at home prior to discharge. Provide handout on Parkinson's Cycle a the [employer] in [place] and [place].    PT Home Exercise Plan  Seated marching, PWR! Up and Reach; 8/19: seated heel and toe, LAQ, standing knee flexion    Consulted and Agree with Plan of Care  Family member/caregiver;Patient    Family Member Consulted  Son       Patient will benefit from skilled therapeutic intervention in order to improve the following deficits and impairments:  Abnormal gait, Cardiopulmonary status limiting activity, Decreased activity tolerance, Decreased balance, Decreased coordination, Decreased endurance, Decreased knowledge of use of DME, Decreased strength, Difficulty walking, Hypomobility, Increased muscle spasms, Impaired perceived functional ability, Impaired flexibility, Improper body mechanics, Postural dysfunction  Visit Diagnosis: Repeated falls  Other symptoms and signs involving the nervous system     Problem List Patient Active Problem List   Diagnosis Date Noted  . Lobar pneumonia, unspecified organism ([place]) 10/29/2017  . Pleural effusion, right   . Pedal edema 05/14/2017  . Parkinson's disease ([place]) 10/05/2016  . Diastolic CHF, chronic ([place]) 05/10/2015  . Dizziness   . Coronary  artery disease involving coronary bypass graft of native heart without angina pectoris   . Near syncope 04/02/2015  . Diarrhea 04/02/2015  . COPD exacerbation ([place]) 06/04/2013  . Shortness of breath 06/04/2013  . Acute respiratory failure with hypoxia ([place]) 06/04/2013  . Hemoptysis 08/10/2012  . Prediabetes 08/28/2011  . Lung cancer ([place]) 08/25/2011  . COPD (chronic obstructive pulmonary disease) ([place]) 08/25/2011  . CKD (chronic kidney disease), stage III ([place]) 12/07/2010  . Chest pain 08/18/2010  . Abdominal pain 08/18/2010  . Syncope   . Tobacco abuse, in remission   . Degenerative joint disease of knee, left   . COLONIC POLYPS 05/20/2009  . Hyperlipidemia 05/20/2009  . ANEMIA 05/20/2009  . Essential tremor 05/20/2009  . ATHEROSCLEROTIC CARDIOVASCULAR DISEASE 05/20/2009  . CEREBROVASCULAR DISEASE 05/20/2009  . Essential hypertension 01/14/2009    Kipp Brood, PT, DPT Physical Therapist with [facility]  12/06/2017 12:11 PM    [place], [place], [address] Phone: [phone]   Fax:  [phone]  Name: CORNELIUS MARULLO MRN: [id number] Date of Birth: [birth year]

## 2017-12-14 ENCOUNTER — Ambulatory Visit (INDEPENDENT_AMBULATORY_CARE_PROVIDER_SITE_OTHER): Payer: Medicare Other | Admitting: Family Medicine

## 2017-12-14 ENCOUNTER — Encounter: Payer: Self-pay | Admitting: Family Medicine

## 2017-12-14 VITALS — BP 110/68 | Ht 67.0 in | Wt 125.6 lb

## 2017-12-14 DIAGNOSIS — H6123 Impacted cerumen, bilateral: Secondary | ICD-10-CM

## 2017-12-14 DIAGNOSIS — R634 Abnormal weight loss: Secondary | ICD-10-CM

## 2017-12-14 DIAGNOSIS — I10 Essential (primary) hypertension: Secondary | ICD-10-CM | POA: Diagnosis not present

## 2017-12-14 DIAGNOSIS — R54 Age-related physical debility: Secondary | ICD-10-CM

## 2017-12-14 DIAGNOSIS — Z23 Encounter for immunization: Secondary | ICD-10-CM

## 2017-12-14 DIAGNOSIS — J431 Panlobular emphysema: Secondary | ICD-10-CM

## 2017-12-14 DIAGNOSIS — G20 Parkinson's disease: Secondary | ICD-10-CM

## 2017-12-14 NOTE — Progress Notes (Signed)
   Subjective:    Patient ID: [name], male    DOB: 1921/11/11, 84 y.o.   MRN: [id number]  HPI Pt here for 3 month follow up.   Patient for blood pressure check up.  The patient does have hypertension.  The patient is on medication.  Patient relates compliance with meds. Todays BP reviewed with the patient. Patient denies issues with medication. Patient relates reasonable diet. Patient tries to minimize salt. Patient aware of BP goals.  Patient has Parkinson's having some tremor having some difficulty getting around denies high fever chills sweats denies nausea vomiting diarrhea takes his medicine on a regular basis.  Frailty patient losing weight uses walker to get around sometimes unstable no falls recently  Patient had difficult time hearing wonders if he has cerumen impaction this is been going on for a little while  Patient has COPD uses medication on a regular basis seems to be doing well with this    Son states that patient diverticulitis flared up yesterday because he ate seeded foods. Son states pt takes a probiotic every day.    Review of Systems  Constitutional: Negative for diaphoresis and fatigue.  HENT: Negative for congestion and rhinorrhea.   Respiratory: Negative for cough and shortness of breath.   Cardiovascular: Negative for chest pain and leg swelling.  Gastrointestinal: Negative for abdominal pain and diarrhea.  Skin: Negative for color change and rash.  Neurological: Positive for tremors. Negative for dizziness and headaches.  Psychiatric/Behavioral: Negative for behavioral problems and confusion.       Objective:   Physical Exam  Constitutional: He appears well-nourished. No distress.  HENT:  Head: Normocephalic and atraumatic.  Eyes: Right eye exhibits no discharge. Left eye exhibits no discharge.  Neck: No tracheal deviation present.  Cardiovascular: Normal rate, regular rhythm and normal heart sounds.  No murmur heard. Pulmonary/Chest: Effort  normal and breath sounds normal. No respiratory distress.  Musculoskeletal: He exhibits no edema.  Lymphadenopathy:    He has no cervical adenopathy.  Neurological: He is alert. Coordination normal.  Skin: Skin is warm and dry.  Psychiatric: He has a normal mood and affect. His behavior is normal.  Vitals reviewed.  Patient does have resting tremors Bilateral cerumen impaction     25 minutes was spent with the patient.  This statement verifies that 25 minutes was indeed spent with the patient.  More than 50% of this visit-total duration of the visit-was spent in counseling and coordination of care. The issues that the patient came in for today as reflected in the diagnosis (s) please refer to documentation for further details.  Assessment & Plan:  HTN blood pressure under good control continue current measures watch diet closely  COPD stable on current medications continue this currently  Parkinson's somewhat worse we will be adjusting the medication  Frailty-we talked about ways to avoid falls continue using walker  Bilateral cerumen impaction referral to ENT for removal  Weight loss I doubt that this is due to his cancer but cannot rule this out totally follow-up again in 3 months I encourage patient to eat more in the evenings he typically does not eat well later in the evening consider using Ensure  I did research his Parkinson's I researched his medication We can go up to a maximum of 8 tablets a day I would recommend that he try doing 3 tablets in the morning 2 tablets midday 2 tablets in the evening He will keep all follow-up

## 2017-12-15 ENCOUNTER — Ambulatory Visit (HOSPITAL_COMMUNITY): Payer: Medicare Other | Attending: Family Medicine

## 2017-12-15 ENCOUNTER — Encounter (HOSPITAL_COMMUNITY): Payer: Self-pay

## 2017-12-15 DIAGNOSIS — R29818 Other symptoms and signs involving the nervous system: Secondary | ICD-10-CM | POA: Diagnosis present

## 2017-12-15 DIAGNOSIS — R296 Repeated falls: Secondary | ICD-10-CM | POA: Diagnosis present

## 2017-12-15 NOTE — Therapy (Signed)
[place], [place], [address] Phone: [phone]   Fax:  [phone]  Physical Therapy Treatment  Patient Details  Name: [name] MRN: [id number] Date of Birth: [birth year] Referring Provider: Sallee Lange   Encounter Date: 12/15/2017  PT End of Session - 12/15/17 1047    Visit Number  9    Number of Visits  24    Date for PT Re-Evaluation  12/22/17    Authorization Type  [employer]HC Medicare    Authorization Time Period  10/21/17-12/22/17 (reassessment on 11/21/17)    Authorization - Visit Number  9    Authorization - Number of Visits  10    PT Start Time  1033    PT Stop Time  1116    PT Time Calculation (min)  43 min    Equipment Utilized During Treatment  Gait belt    Activity Tolerance  Patient tolerated treatment well;Patient limited by fatigue    Behavior During Therapy  [facility] for tasks assessed/performed       Past Medical History:  Diagnosis Date  . ASCVD (arteriosclerotic cardiovascular disease)     CABG in 04/1993; and negative stress nuclear study in 08/2001  . Benign essential tremor   . CAD (coronary artery disease)   . Cancer ([place])    skin  . Cerebrovascular disease    Right carotid bruit-40-69% left internal carotid artery stenosis in 4/06; followed [facility]  . Chronic kidney disease, stage II (mild)    Creatinine-1.6 in 09/2008  . COPD (chronic obstructive pulmonary disease) ([place])   . Degenerative joint disease of knee, left   . Gout   . Hiatal hernia   . Horseshoe kidney   . Hyperlipidemia   . Hypertension   . Lung cancer ([place]) 2013  . Normocytic anemia   . Prediabetes   . Pulmonary disease   . Renal insufficiency   . Syncope   . Tobacco abuse, in remission    40 pack years; discontinued in 1980    Past Surgical History:  Procedure Laterality Date  . CARDIAC SURGERY    . COLONOSCOPY W/ POLYPECTOMY  1985  . CORONARY ARTERY BYPASS GRAFT  1995  . LESION EXCISION    . PILONIDAL CYST EXCISION   1948    There were no vitals filed for this visit.  Subjective Assessment - 12/15/17 1046    Subjective  Pt arrived with son, no reports of pain.  Son reports weight loss and now has wound on botton, given doughnut to sit on.      Pertinent History  Falls history prior to this year but less injurious, son estimates 10-15 falls in past 5 years. Parkinsons has been diagnosed for about 10 years. Some tremors, some mild freezing, Son suspects carbidopa is not making much difference in symptoms.     Patient Stated Goals  fall less frequently.     Currently in Pain?  No/denies                       [facility] Adult PT Treatment/Exercise - 12/15/17 0001      Balance Poses: Yoga   Warrior I  2 reps;30 seconds    Warrior II  2 reps;30 seconds      Exercises   Exercises  Knee/Hip      Knee/Hip Exercises: Clinical research associate  2 reps;30 seconds    Gastroc Stretch Limitations  slant board  Knee/Hip Exercises: Standing   Heel Raises  Both;20 reps    Heel Raises Limitations  Toe raises incline slope        [facility]) - 12/15/17 1048    PWR! Twist  2x 10    PWR Step  2x 10; step to colored dot and hit boom whacker ipsilateral then contralateral side    PWR! Step Through Forward/Back  1x 10 reps bil directions, 6" hurdle with reach for cone from contralatearl UE    PWR! Sit to Stand  reaching for volley ball, then UE flexion and squat to lower ball    Reaching Comments  2x 10 reps bil, standing, twist/reach to tap cones       Balance Exercises - 12/15/17 1116      Balance Exercises: Standing   Tandem Stance  Intermittent upper extremity support;4 reps    Sidestepping  2 reps;Limitations    Step Over Hurdles / Cones  4x RT in // bars forward 6" hurdle step over    Other Standing Exercises  Warrior I and II 2x 30"          PT Short Term Goals - 11/15/17 1113      PT SHORT TERM GOAL #1   Title  At 3 weeks pt/son independent in dynamic balance/LSVT style  home program to improve falls recovery.     Status  On-going      PT SHORT TERM GOAL #2   Title  At 4 weeks pt will demonstrate tolerance of 698ft AMB without LLE pain limitations at >0.13m/s.     Status  On-going      PT SHORT TERM GOAL #3   Title  At 4 weeks patient will demonstrate BBT> 42    Status  On-going      PT SHORT TERM GOAL #4   Status  On-going        PT Long Term Goals - 11/15/17 1113      PT LONG TERM GOAL #1   Title  At 8 weeks pt will be independent in home based LE/UE strengthening program to improve falls recovery.     Status  On-going      PT LONG TERM GOAL #2   Title  At 8 weeks patient will demonstrate BBT socre> 46/56.     Status  On-going      PT LONG TERM GOAL #3   Title  At 8 weeks pt will demonstrate tolerance of 970ft AMB c 4WW at >0.97m/s to improve ability to access the community safely.     Status  On-going            Plan - 12/15/17 1122    Clinical Impression Statement  Continued session focus with PWR! activities to improve balalnce, coordination and strengthening.  Pt with improved sequencing though does require more cueing with increased speed with activities.  Reviewed HEP compliance with additional PWR twist instructed to improve mobility wiht gait and balance.  Therapist also discussed benefits and provided pt and son a handout on Parkinson's Cycle a the [employer] in [place].  Pt was limited by fatigue thorugh session, required 3 seated rest breaks through session.      Rehab Potential  Good    PT Duration  8 weeks    PT Next Visit Plan  Review complaince iwth new HEP (seated PWR twist).  Continue with music for timing next session. Continue with PWR! exercises and functional strengthening for posture. Continue with heel/toe raises  for gait. Focus on timing/motor control. Progress/update HEP for son to work on with patient at home prior to discharge.     PT Home Exercise Plan  Seated marching, PWR! Up and Reach; 8/19:  seated heel and toe, LAQ, standing knee flexion; 12/15/17:  PWR! seated twist       Patient will benefit from skilled therapeutic intervention in order to improve the following deficits and impairments:  Abnormal gait, Cardiopulmonary status limiting activity, Decreased activity tolerance, Decreased balance, Decreased coordination, Decreased endurance, Decreased knowledge of use of DME, Decreased strength, Difficulty walking, Hypomobility, Increased muscle spasms, Impaired perceived functional ability, Impaired flexibility, Improper body mechanics, Postural dysfunction  Visit Diagnosis: Repeated falls  Other symptoms and signs involving the nervous system     Problem List Patient Active Problem List   Diagnosis Date Noted  . Lobar pneumonia, unspecified organism ([place]) 10/29/2017  . Pleural effusion, right   . Pedal edema 05/14/2017  . Parkinson's disease ([place]) 10/05/2016  . Diastolic CHF, chronic ([place]) 05/10/2015  . Dizziness   . Coronary artery disease involving coronary bypass graft of native heart without angina pectoris   . Near syncope 04/02/2015  . Diarrhea 04/02/2015  . COPD exacerbation ([place]) 06/04/2013  . Shortness of breath 06/04/2013  . Acute respiratory failure with hypoxia ([place]) 06/04/2013  . Hemoptysis 08/10/2012  . Prediabetes 08/28/2011  . Lung cancer ([place]) 08/25/2011  . COPD (chronic obstructive pulmonary disease) ([place]) 08/25/2011  . CKD (chronic kidney disease), stage III ([place]) 12/07/2010  . Chest pain 08/18/2010  . Abdominal pain 08/18/2010  . Syncope   . Tobacco abuse, in remission   . Degenerative joint disease of knee, left   . COLONIC POLYPS 05/20/2009  . Hyperlipidemia 05/20/2009  . ANEMIA 05/20/2009  . Essential tremor 05/20/2009  . ATHEROSCLEROTIC CARDIOVASCULAR DISEASE 05/20/2009  . CEREBROVASCULAR DISEASE 05/20/2009  . Essential hypertension 01/14/2009   Ihor Austin, [place]; [place]  Aldona Lento 12/15/2017, 2:06 PM  [place] [address] [place], [place], [address] Phone: [phone]   Fax:  [phone]  Name: [name] MRN: [id number] Date of Birth: 03-14-1922

## 2017-12-20 ENCOUNTER — Ambulatory Visit (HOSPITAL_COMMUNITY): Payer: Medicare Other

## 2017-12-20 ENCOUNTER — Other Ambulatory Visit: Payer: Self-pay

## 2017-12-20 ENCOUNTER — Encounter (HOSPITAL_COMMUNITY): Payer: Self-pay

## 2017-12-20 DIAGNOSIS — R296 Repeated falls: Secondary | ICD-10-CM | POA: Diagnosis not present

## 2017-12-20 DIAGNOSIS — R29818 Other symptoms and signs involving the nervous system: Secondary | ICD-10-CM

## 2017-12-20 NOTE — Therapy (Signed)
[place], [place], [address] Phone: [phone]   Fax:  [phone]  Physical Therapy Treatment/Discharge Summary  Patient Details  Name: [name] MRN: [id number] Date of Birth: 04/17/1921 Referring Provider: Sallee Lange   Encounter Date: 12/20/2017   PHYSICAL THERAPY DISCHARGE SUMMARY  Visits from Start of Care: 10  Current functional level related to goals / functional outcomes: Patient re-assessed today and he has made some progress with bil LE strength and functional strength. He no longer requires UE assist for sit to stand transfers and has demonstrated improved knowledge of use of rollator for daily mobility. He has maintained good compliance with HEP reports confidence with performing exercises to continue progressing. He has previously ambulated at 0.8 m/s for >600 feet but reported feeling slightly fatigued today and his walking speed was 0.71 m/s. He was unable to walk a full 6 minutes due to fatigue and requested a seated rest break. Patient and son were educated on benefit of regular exercise to maintain current functional independence and strength. Patient agrees he is ready to discharge from PT and attempt independent strengthening and exercise.    Remaining deficits: See below details   Education / Equipment: Educated on progress towards goals at this time. Edcuated on readiness to discharge from therapy and contineu with HEP. Educated on benefits of aquatic exercise as the pool is a safe environment for balance training.   Plan: Patient agrees to discharge.  Patient goals were partially met. Patient is being discharged due to meeting the stated rehab goals.  ?????       PT End of Session - 12/20/17 1213    Visit Number  10    Number of Visits  24    Date for PT Re-Evaluation  12/22/17    Authorization Type  [employer] Medicare    Authorization Time Period  10/21/17-12/22/17 (reassessment on 11/21/17)     Authorization - Visit Number  10    Authorization - Number of Visits  10    PT Start Time  1038    PT Stop Time  1113    PT Time Calculation (min)  35 min    Equipment Utilized During Treatment  Gait belt    Activity Tolerance  Patient tolerated treatment well;Patient limited by fatigue    Behavior During Therapy  WFL for tasks assessed/performed       Past Medical History:  Diagnosis Date  . ASCVD (arteriosclerotic cardiovascular disease)     CABG in 04/1993; and negative stress nuclear study in 08/2001  . Benign essential tremor   . CAD (coronary artery disease)   . Cancer ([place])    skin  . Cerebrovascular disease    Right carotid bruit-40-69% left internal carotid artery stenosis in 4/06; followed [facility]  . Chronic kidney disease, stage II (mild)    Creatinine-1.6 in 09/2008  . COPD (chronic obstructive pulmonary disease) ([place])   . Degenerative joint disease of knee, left   . Gout   . Hiatal hernia   . Horseshoe kidney   . Hyperlipidemia   . Hypertension   . Lung cancer ([place]) 2013  . Normocytic anemia   . Prediabetes   . Pulmonary disease   . Renal insufficiency   . Syncope   . Tobacco abuse, in remission    40 pack years; discontinued in 1980    Past Surgical History:  Procedure Laterality Date  . CARDIAC SURGERY    . COLONOSCOPY W/ POLYPECTOMY  [place]  . LESION EXCISION    . PILONIDAL CYST EXCISION  1948    There were no vitals filed for this visit.  Subjective Assessment - 12/20/17 1215    Subjective  Patient and son arrive. Patient denies pain. They both report the patient has not been sleeping well and has been more fatigued this past week.    Pertinent History  Falls history prior to this year but less injurious, son estimates 10-15 falls in past 5 years. Parkinsons has been diagnosed for about 10 years. Some tremors, some mild freezing, Son suspects carbidopa is not making much difference in symptoms.     Patient Stated  Goals  fall less frequently.     Currently in Pain?  No/denies         [facility] PT Assessment - 12/20/17 0001      Assessment   Medical Diagnosis  Falls in Parkinsonism    Referring Provider  Scott Luking    Hand Dominance  Right    Next MD Visit  Septmeber is regular check up      Prior Function   Level of Independence  Independent with basic ADLs   son helps with bathing; SPC in home, rollator outside     Cognition   Overall Cognitive Status  Within Functional Limits for tasks assessed      Strength   Right Hip Flexion  5/5    Right Hip External Rotation   5/5    Right Hip Internal Rotation  5/5    Right Hip ABduction  4/5    Left Hip Flexion  5/5    Left Hip External Rotation  5/5    Left Hip Internal Rotation  5/5    Left Hip ABduction  4/5    Right Knee Flexion  4+/5    Right Knee Extension  5/5    Left Knee Flexion  4+/5    Left Knee Extension  5/5    Right Ankle Dorsiflexion  4+/5    Left Ankle Dorsiflexion  4+/5      Transfers   Five time sit to stand comments   20.1 without UE assist   13.16 on 11/15/17 with UE assist     Ambulation/Gait   Ambulation/Gait  Yes    Ambulation/Gait Assistance  6: Modified independent (Device/Increase time)    Ambulation Distance (Feet)  678 Feet    Assistive device  4-wheeled walker    Gait Pattern  Decreased step length - right;Decreased stance time - right;Decreased stride length    Ambulation Surface  Level;Indoor    Gait velocity  0.71 m/s   was 0.86 m/s on 11/15/17     Standardized Balance Assessment   Standardized Balance Assessment  Berg Balance Test      Berg Balance Test   Sit to Stand  Able to stand  independently using hands    Standing Unsupported  Able to stand 2 minutes with supervision    Sitting with Back Unsupported but Feet Supported on Floor or Stool  Able to sit safely and securely 2 minutes    Stand to Sit  Controls descent by using hands    Transfers  Able to transfer safely, definite need of hands     Standing Unsupported with Eyes Closed  Able to stand 10 seconds with supervision    Standing Ubsupported with Feet Together  Able to place feet together independently and stand for 1 minute with supervision  From Standing, Reach Forward with Outstretched Arm  Can reach forward >12 cm safely (5")    From Standing Position, Pick up Object from Floor  Able to pick up shoe, needs supervision    From Standing Position, Turn to Look Behind Over each Shoulder  Looks behind one side only/other side shows less weight shift    Turn 360 Degrees  Able to turn 360 degrees safely but slowly    Standing Unsupported, Alternately Place Feet on Step/Stool  Able to complete >2 steps/needs minimal assist    Standing Unsupported, One Foot in Front  Able to take small step independently and hold 30 seconds    Standing on One Leg  Tries to lift leg/unable to hold 3 seconds but remains standing independently    Total Score  37    Berg comment:  indicates he remains at risk for falls          PT Education - 12/20/17 1221    Education provided  Yes    Education Details  Educated on progress towards goals at this time. Edcuated on readiness to discharge from therapy and contineu with HEP. Educated on benefits of aquatic exercise as the pool is a safe environment for balance training.     Person(s) Educated  Patient;Child(ren)    Methods  Explanation    Comprehension  Verbalized understanding       PT Short Term Goals - 12/20/17 1052      PT SHORT TERM GOAL #1   Title  At 3 weeks pt/son independent in dynamic balance/LSVT style home program to improve falls recovery.     Time  3    Period  Weeks    Status  Achieved      PT SHORT TERM GOAL #2   Title  At 4 weeks pt will demonstrate tolerance of 635f AMB without LLE pain limitations at >0.842m.     Baseline  previously met on 11/15/17, today ambulated at 0.71 m/s    Time  3    Period  Weeks    Status  Partially Met      PT SHORT TERM GOAL #3   Title   At 4 weeks patient will demonstrate BBT> 42    Time  3    Period  Weeks    Status  Not Met      PT SHORT TERM GOAL #4   Status  --        PT Long Term Goals - 12/20/17 1223      PT LONG TERM GOAL #1   Title  At 8 weeks pt will be independent in home based LE/UE strengthening program to improve falls recovery.     Time  6    Period  Weeks    Status  Achieved      PT LONG TERM GOAL #2   Title  At 8 weeks patient will demonstrate BBT socre> 46/56.     Time  6    Period  Weeks    Status  Not Met      PT LONG TERM GOAL #3   Title  At 8 weeks pt will demonstrate tolerance of 90078fMB c 4WW at >0.70m42mo improve ability to access the community safely.     Baseline  patient fatigued after 678 feet requesting rest break    Status  Not Met         Plan - 12/20/17 1224    Clinical Impression Statement  Patient  re-assessed today and he has made some progress with bil LE strength and functional strength. He no longer requires UE assist for sit to stand transfers and has demonstrated improved knowledge of use of rollator for daily mobility. He has maintained good compliance with HEP reports confidence with performing exercises to continue progressing. He has previously ambulated at 0.8 m/s for >600 feet but reported feeling slightly fatigued today and his walking speed was 0.71 m/s. He was unable to walk a full 6 minutes due to fatigue and requested a seated rest break. Patient and son were educated on benefit of regular exercise to maintain current functional independence and strength. Patient agrees he is ready to discharge from PT and attempt independent strengthening and exercise.     Rehab Potential  Good    PT Duration  8 weeks    PT Treatment/Interventions  ADLs/Self Care Home Management;Electrical Stimulation;Moist Heat;DME Instruction;Gait training;Stair training;Functional mobility training;Therapeutic activities;Therapeutic exercise;Balance training;Neuromuscular  re-education;Patient/family education;Manual techniques;Passive range of motion;Dry needling;Taping;Energy conservation;Cognitive remediation;Cryotherapy    PT Next Visit Plan  discharge this session    PT Home Exercise Plan  Seated marching, PWR! Up and Reach; 8/19: seated heel and toe, LAQ, standing knee flexion; 12/15/17:  PWR! seated twist    Consulted and Agree with Plan of Care  Family member/caregiver;Patient    Family Member Consulted  Son       Patient will benefit from skilled therapeutic intervention in order to improve the following deficits and impairments:  Abnormal gait, Cardiopulmonary status limiting activity, Decreased activity tolerance, Decreased balance, Decreased coordination, Decreased endurance, Decreased knowledge of use of DME, Decreased strength, Difficulty walking, Hypomobility, Increased muscle spasms, Impaired perceived functional ability, Impaired flexibility, Improper body mechanics, Postural dysfunction  Visit Diagnosis: Repeated falls  Other symptoms and signs involving the nervous system     Problem List Patient Active Problem List   Diagnosis Date Noted  . Lobar pneumonia, unspecified organism ([place]) 10/29/2017  . Pleural effusion, right   . Pedal edema 05/14/2017  . Parkinson's disease ([place]) 10/05/2016  . Diastolic CHF, chronic ([place]) 05/10/2015  . Dizziness   . Coronary artery disease involving coronary bypass graft of native heart without angina pectoris   . Near syncope 04/02/2015  . Diarrhea 04/02/2015  . COPD exacerbation ([place]) 06/04/2013  . Shortness of breath 06/04/2013  . Acute respiratory failure with hypoxia ([place]) 06/04/2013  . Hemoptysis 08/10/2012  . Prediabetes 08/28/2011  . Lung cancer ([place]) 08/25/2011  . COPD (chronic obstructive pulmonary disease) ([place]) 08/25/2011  . CKD (chronic kidney disease), stage III ([place]) 12/07/2010  . Chest pain 08/18/2010  . Abdominal pain 08/18/2010  . Syncope   . Tobacco abuse, in remission   .  Degenerative joint disease of knee, left   . COLONIC POLYPS 05/20/2009  . Hyperlipidemia 05/20/2009  . ANEMIA 05/20/2009  . Essential tremor 05/20/2009  . ATHEROSCLEROTIC CARDIOVASCULAR DISEASE 05/20/2009  . CEREBROVASCULAR DISEASE 05/20/2009  . Essential hypertension 01/14/2009    Kipp Brood, PT, DPT Physical Therapist with [facility]  12/20/2017 12:25 PM    [place], [place], [address] Phone: [phone]   Fax:  [phone]  Name: [name] MRN: [id number] Date of Birth: August 10, 1921

## 2017-12-23 MED ORDER — CARBIDOPA-LEVODOPA 25-100 MG PO TABS: ORAL_TABLET | ORAL | 5 refills | Status: DC

## 2017-12-23 NOTE — Addendum Note (Signed)
Addended by: Karle Barr on: 12/23/2017 01:43 PM   Modules accepted: Orders

## 2017-12-23 NOTE — Progress Notes (Signed)
Son is aware of all .Increased and put in epic.

## 2018-01-05 ENCOUNTER — Other Ambulatory Visit: Payer: Self-pay | Admitting: Family Medicine

## 2018-01-14 ENCOUNTER — Encounter: Payer: Self-pay | Admitting: Family Medicine

## 2018-01-14 ENCOUNTER — Ambulatory Visit: Payer: Medicare Other | Admitting: Family Medicine

## 2018-01-14 VITALS — BP 112/62 | Ht 67.0 in | Wt 128.4 lb

## 2018-01-14 DIAGNOSIS — N183 Chronic kidney disease, stage 3 unspecified: Secondary | ICD-10-CM

## 2018-01-14 DIAGNOSIS — R600 Localized edema: Secondary | ICD-10-CM | POA: Diagnosis not present

## 2018-01-14 DIAGNOSIS — D649 Anemia, unspecified: Secondary | ICD-10-CM | POA: Diagnosis not present

## 2018-01-14 DIAGNOSIS — E778 Other disorders of glycoprotein metabolism: Secondary | ICD-10-CM | POA: Diagnosis not present

## 2018-01-14 DIAGNOSIS — M1000 Idiopathic gout, unspecified site: Secondary | ICD-10-CM

## 2018-01-14 DIAGNOSIS — M109 Gout, unspecified: Secondary | ICD-10-CM | POA: Insufficient documentation

## 2018-01-14 MED ORDER — TORSEMIDE 20 MG PO TABS: ORAL_TABLET | ORAL | 1 refills | Status: DC

## 2018-01-14 MED ORDER — PREDNISONE 10 MG PO TABS: ORAL_TABLET | ORAL | 0 refills | Status: DC

## 2018-01-14 NOTE — Progress Notes (Signed)
   Subjective:    Patient ID: [name], male    DOB: [birth year], 84 y.o.   MRN: [id number]  Foot Pain  This is a chronic problem. The current episode started in the past 7 days. Pertinent negatives include no abdominal pain, chest pain, congestion, coughing, fatigue, fever, headaches, nausea or weakness. Treatments tried: elevating feet. The treatment provided mild relief.  Pt son states that pt has this problem on and off. Pt states it hurts more in the morning and subsides as the day goes on. Pt states his feet and ankles are swollen. Son states that pt right hand was swollen last week into this week. Tylenol given and that did help Patient relates bilateral foot swelling.  Denies high fever chills sweats wheezing difficulty breathing other than what is normal for him Relates pain discomfort with walking Not eating much Weight is steady    Review of Systems  Constitutional: Negative for activity change, fatigue and fever.  HENT: Negative for congestion and rhinorrhea.   Respiratory: Negative for cough and shortness of breath.   Cardiovascular: Negative for chest pain and leg swelling.  Gastrointestinal: Negative for abdominal pain, diarrhea and nausea.  Genitourinary: Negative for dysuria and hematuria.  Neurological: Negative for weakness and headaches.  Psychiatric/Behavioral: Negative for agitation and behavioral problems.   Please see above     Objective:   Physical Exam  Constitutional: He appears well-nourished.  Cardiovascular: Normal rate, regular rhythm and normal heart sounds.  No murmur heard. Pulmonary/Chest: Effort normal and breath sounds normal.  Musculoskeletal: He exhibits no edema.  Lymphadenopathy:    He has no cervical adenopathy.  Neurological: He is alert.  Psychiatric: His behavior is normal.  Vitals reviewed.    Bilateral foot swelling along with tenderness in the ankle and the foot     Assessment & Plan:  Possible gout Prednisone  taper Increase diuretic Follow-up lab work Probable low protein related to poor diet This is also triggering the pedal edema

## 2018-01-15 LAB — BASIC METABOLIC PANEL
BUN/Creatinine Ratio: 18 (ref 10–24)
BUN: 33 mg/dL (ref 10–36)
CALCIUM: 9 mg/dL (ref 8.6–10.2)
CHLORIDE: 100 mmol/L (ref 96–106)
CO2: 24 mmol/L (ref 20–29)
Creatinine, Ser: 1.85 mg/dL — ABNORMAL HIGH (ref 0.76–1.27)
GFR calc Af Amer: 35 mL/min/{1.73_m2} — ABNORMAL LOW (ref >59)
GFR, EST NON AFRICAN AMERICAN: 30 mL/min/{1.73_m2} — AB (ref >59)
GLUCOSE: 104 mg/dL — AB (ref 65–99)
POTASSIUM: 4.5 mmol/L (ref 3.5–5.2)
SODIUM: 141 mmol/L (ref 134–144)

## 2018-01-15 LAB — ALBUMIN: ALBUMIN: 3.6 g/dL (ref 3.2–4.6)

## 2018-01-15 LAB — URIC ACID: URIC ACID: 9.2 mg/dL — AB (ref 3.7–8.6)

## 2018-01-20 ENCOUNTER — Telehealth: Payer: Self-pay | Admitting: Family Medicine

## 2018-01-20 NOTE — Telephone Encounter (Signed)
Notes recorded by Kathyrn Drown, MD on 01/16/2018 at 7:08 PM EDT Kidney functions are stable Uric acid is elevated Because of kidney functions can add additional medicine for uric acid Recommend low purine diet Minimize processed foods as best as possible especially processed meats-such as bologna, pepperoni, excessive meats, minimize cheeses, nuts Protein level actually pretty good Do the best he can at eating and continue current medications please notify us if ongoing troubles

## 2018-01-20 NOTE — Telephone Encounter (Signed)
Pt's son Legrand Como) is calling in regards of blood work pt had done the other day. Pt's son is going out of town tomorrow and was wondering if it would be possible for a nurse to give son a call to go over results. Advise.  (SON IS ON DPR)

## 2018-01-20 NOTE — Telephone Encounter (Signed)
Discussed results with son (DPR). Son verbalized understanding.

## 2018-01-21 ENCOUNTER — Telehealth: Payer: Self-pay | Admitting: Family Medicine

## 2018-01-21 NOTE — Telephone Encounter (Signed)
Please send in wound coverage treatments that they are requesting

## 2018-01-21 NOTE — Telephone Encounter (Signed)
Janean Sark (daughter) called saying Mr. Fitzgerald has scrapped his arm. Not bad enough to be seen, just removed the skin.  She would like a prescription for the yellow gauze bandage that we wrapped his arm in last time to be sent in to [facility].  [employer] [phone]

## 2018-01-21 NOTE — Telephone Encounter (Signed)
Called son Ronalee Belts and he states he got what he needed otc and did not need rx.

## 2018-01-24 ENCOUNTER — Ambulatory Visit (INDEPENDENT_AMBULATORY_CARE_PROVIDER_SITE_OTHER): Payer: Medicare Other | Admitting: Otolaryngology

## 2018-01-24 DIAGNOSIS — H900 Conductive hearing loss, bilateral: Secondary | ICD-10-CM | POA: Diagnosis not present

## 2018-01-24 DIAGNOSIS — H6123 Impacted cerumen, bilateral: Secondary | ICD-10-CM

## 2018-01-31 ENCOUNTER — Other Ambulatory Visit: Payer: Self-pay | Admitting: Family Medicine

## 2018-01-31 ENCOUNTER — Telehealth: Payer: Self-pay | Admitting: Family Medicine

## 2018-02-28 ENCOUNTER — Other Ambulatory Visit: Payer: Self-pay | Admitting: Family Medicine

## 2018-03-15 ENCOUNTER — Encounter: Payer: Self-pay | Admitting: Family Medicine

## 2018-03-15 ENCOUNTER — Ambulatory Visit: Payer: Medicare Other | Admitting: Family Medicine

## 2018-03-15 ENCOUNTER — Other Ambulatory Visit: Payer: Self-pay

## 2018-03-15 VITALS — BP 124/66 | Ht 67.0 in | Wt 126.4 lb

## 2018-03-15 DIAGNOSIS — N183 Chronic kidney disease, stage 3 unspecified: Secondary | ICD-10-CM

## 2018-03-15 DIAGNOSIS — I10 Essential (primary) hypertension: Secondary | ICD-10-CM

## 2018-03-15 DIAGNOSIS — E785 Hyperlipidemia, unspecified: Secondary | ICD-10-CM | POA: Diagnosis not present

## 2018-03-15 DIAGNOSIS — G20 Parkinson's disease: Secondary | ICD-10-CM

## 2018-03-15 DIAGNOSIS — J431 Panlobular emphysema: Secondary | ICD-10-CM

## 2018-03-15 MED ORDER — CARBIDOPA-LEVODOPA 25-100 MG PO TABS: ORAL_TABLET | ORAL | 5 refills | Status: AC

## 2018-03-15 MED ORDER — ZOSTER VAC RECOMB ADJUVANTED 50 MCG/0.5ML IM SUSR: 0.5000 mL | Freq: Once | INTRAMUSCULAR | 1 refills | Status: AC

## 2018-03-15 NOTE — Progress Notes (Signed)
Subjective:    Patient ID: [name], male    DOB: 1921/04/20, 84 y.o.   MRN: [id number]  Hypertension  This is a chronic problem. Pertinent negatives include no chest pain, headaches or shortness of breath. There are no compliance problems (takes meds every day, eats healthy).   Patient for blood pressure check up.  The patient does have hypertension.  The patient is on medication.  Patient relates compliance with meds. Todays BP reviewed with the patient. Patient denies issues with medication. Patient relates reasonable diet. Patient tries to minimize salt. Patient aware of BP goals.  COPD overall stable on the medications uses his inhalers on a regular basis breathing seems to be doing relatively good  Parkinson's disease takes his medicine still has a lot of tremors but is able to get around no falls recently  Patient here for follow-up regarding cholesterol.  The patient does have hyperlipidemia.  Patient does try to maintain a reasonable diet.  Patient does take the medication on a regular basis.  Denies missing a dose.  The patient denies any obvious side effects.  Prior blood work results reviewed with the patient.  The patient is aware of his cholesterol goals and the need to keep it under good control to lessen the risk of disease.  Patient does have ongoing trouble with reflux.  Takes medication on a regular basis.  Tries to minimize foods as best they can.  They understand the importance of dietary compliance.  May also try to avoid eating a large meal close to bedtime.  Patient denies any dysphagia denies hematochezia.  States medicine does a good job keeping the problem under good control.  Without the medication may certainly have issues.They desire to continue taking their medication.  Patient also with some urinary flow issues but uses Flomax on a regular basis this seems to be helping   Pt states no concerns today.    Review of Systems  Constitutional: Negative for  diaphoresis and fatigue.  HENT: Negative for congestion and rhinorrhea.   Respiratory: Negative for cough and shortness of breath.   Cardiovascular: Negative for chest pain and leg swelling.  Gastrointestinal: Negative for abdominal pain and diarrhea.  Skin: Negative for color change and rash.  Neurological: Negative for dizziness and headaches.  Psychiatric/Behavioral: Negative for behavioral problems and confusion.       Objective:   Physical Exam  Constitutional: He appears well-nourished. No distress.  HENT:  Head: Normocephalic and atraumatic.  Eyes: Right eye exhibits no discharge. Left eye exhibits no discharge.  Neck: No tracheal deviation present.  Cardiovascular: Normal rate, regular rhythm and normal heart sounds.  No murmur heard. Pulmonary/Chest: Effort normal and breath sounds normal. No respiratory distress.  Musculoskeletal: He exhibits no edema.  Lymphadenopathy:    He has no cervical adenopathy.  Neurological: He is alert. Coordination normal.  Skin: Skin is warm and dry.  Psychiatric: He has a normal mood and affect. His behavior is normal.  Vitals reviewed.         Assessment & Plan:  25 minutes was spent with the patient.  This statement verifies that 25 minutes was indeed spent with the patient.  More than 50% of this visit-total duration of the visit-was spent in counseling and coordination of care. The issues that the patient came in for today as reflected in the diagnosis (s) please refer to documentation for further details.  COPD stable continue current inhalers  Shin Grix recommended  Parkinson's will increase  the dose of the medicine to see if this helps  The patient was seen today as part of an evaluation regarding hyperlipidemia.  Recent lab work has been reviewed with the patient as well as the goals for good cholesterol care.  In addition to this medications have been discussed the importance of compliance with diet and medications  discussed as well.  Finally the patient is aware that poor control of cholesterol, noncompliance can dramatically increase the risk of complications. The patient will keep regular office visits and the patient does agreed to periodic lab work.  HTN- Patient was seen today as part of a visit regarding hypertension. The importance of healthy diet and regular physical activity was discussed. The importance of compliance with medications discussed.  Ideal goal is to keep blood pressure low elevated levels certainly below 241/75 when possible.  The patient was counseled that keeping blood pressure under control lessen his risk of complications.  The importance of regular follow-ups was discussed with the patient.  Low-salt diet such as DASH recommended.  Regular physical activity was recommended as well.  Patient was advised to keep regular follow-ups.  Weight is steady very important for patient to take an significant amount of calories  Follow-up in the spring

## 2018-03-15 NOTE — Progress Notes (Signed)
Medication up dated on Epic list

## 2018-03-17 LAB — BASIC METABOLIC PANEL
BUN/Creatinine Ratio: 22 (ref 10–24)
BUN: 48 mg/dL — ABNORMAL HIGH (ref 10–36)
CALCIUM: 9.2 mg/dL (ref 8.6–10.2)
CO2: 24 mmol/L (ref 20–29)
Chloride: 107 mmol/L — ABNORMAL HIGH (ref 96–106)
Creatinine, Ser: 2.15 mg/dL — ABNORMAL HIGH (ref 0.76–1.27)
GFR calc non Af Amer: 25 mL/min/{1.73_m2} — ABNORMAL LOW (ref >59)
GFR, EST AFRICAN AMERICAN: 29 mL/min/{1.73_m2} — AB (ref >59)
Glucose: 101 mg/dL — ABNORMAL HIGH (ref 65–99)
Potassium: 4.5 mmol/L (ref 3.5–5.2)
Sodium: 145 mmol/L — ABNORMAL HIGH (ref 134–144)

## 2018-03-17 LAB — HEPATIC FUNCTION PANEL
ALT: 4 IU/L (ref 0–44)
AST: 16 IU/L (ref 0–40)
Albumin: 3.6 g/dL (ref 3.2–4.6)
Alkaline Phosphatase: 83 IU/L (ref 39–117)
BILIRUBIN TOTAL: 0.4 mg/dL (ref 0.0–1.2)
Bilirubin, Direct: 0.17 mg/dL (ref 0.00–0.40)
Total Protein: 5.6 g/dL — ABNORMAL LOW (ref 6.0–8.5)

## 2018-03-17 LAB — LIPID PANEL
CHOLESTEROL TOTAL: 152 mg/dL (ref 100–199)
Chol/HDL Ratio: 1.9 ratio (ref 0.0–5.0)
HDL: 81 mg/dL (ref >39)
LDL CALC: 62 mg/dL (ref 0–99)
TRIGLYCERIDES: 45 mg/dL (ref 0–149)
VLDL CHOLESTEROL CAL: 9 mg/dL (ref 5–40)

## 2018-03-18 MED ORDER — TORSEMIDE 20 MG PO TABS: ORAL_TABLET | ORAL | 1 refills | Status: DC

## 2018-03-18 NOTE — Addendum Note (Signed)
Addended by: Karle Barr on: 03/18/2018 04:54 PM   Modules accepted: Orders

## 2018-03-23 ENCOUNTER — Other Ambulatory Visit: Payer: Self-pay | Admitting: *Deleted

## 2018-03-23 ENCOUNTER — Telehealth: Payer: Self-pay | Admitting: Family Medicine

## 2018-03-23 MED ORDER — TORSEMIDE 20 MG PO TABS: ORAL_TABLET | ORAL | 3 refills | Status: DC

## 2018-03-23 NOTE — Telephone Encounter (Signed)
According to our documentation he is taking half tablet every morning for his swelling?  If so he can increase this to 1 tablet for the next few days  See if this does not help reduce the swelling  If progressive troubles I recommend follow-up  He will need a new prescription sent in for the medication eventually New prescription should say half tablet to a whole tablet every morning as needed swelling, may have 33-month supply with 2 refills

## 2018-03-23 NOTE — Telephone Encounter (Signed)
Pt is having swelling in his feet for the last 2 days. His son is calling in seeing if they need to up his torsemide (DEMADEX) 20 MG tablet to one tablet in the morning. This morning it was hard for them to get his socks and shoes on this morning.   CB# [phone]

## 2018-03-23 NOTE — Telephone Encounter (Signed)
Discussed with pt's son Ronalee Belts. He verbalized understanding of all. He states he does not need a rx at this time. rx with new directions sent to pharm with a note to pharm to put on file.

## 2018-03-27 ENCOUNTER — Encounter (HOSPITAL_COMMUNITY): Payer: Self-pay

## 2018-03-27 ENCOUNTER — Emergency Department (HOSPITAL_COMMUNITY)
Admission: EM | Admit: 2018-03-27 | Discharge: 2018-03-27 | Disposition: A | Discharge status: Home / Self Care | Payer: Medicare Other | Attending: Emergency Medicine | Admitting: Emergency Medicine

## 2018-03-27 ENCOUNTER — Emergency Department (HOSPITAL_COMMUNITY): Payer: Medicare Other

## 2018-03-27 ENCOUNTER — Other Ambulatory Visit: Payer: Self-pay

## 2018-03-27 DIAGNOSIS — I2510 Atherosclerotic heart disease of native coronary artery without angina pectoris: Secondary | ICD-10-CM | POA: Insufficient documentation

## 2018-03-27 DIAGNOSIS — J449 Chronic obstructive pulmonary disease, unspecified: Secondary | ICD-10-CM | POA: Insufficient documentation

## 2018-03-27 DIAGNOSIS — W01198A Fall on same level from slipping, tripping and stumbling with subsequent striking against other object, initial encounter: Secondary | ICD-10-CM | POA: Diagnosis not present

## 2018-03-27 DIAGNOSIS — Z951 Presence of aortocoronary bypass graft: Secondary | ICD-10-CM | POA: Diagnosis not present

## 2018-03-27 DIAGNOSIS — I130 Hypertensive heart and chronic kidney disease with heart failure and stage 1 through stage 4 chronic kidney disease, or unspecified chronic kidney disease: Secondary | ICD-10-CM | POA: Insufficient documentation

## 2018-03-27 DIAGNOSIS — W19XXXA Unspecified fall, initial encounter: Secondary | ICD-10-CM

## 2018-03-27 DIAGNOSIS — Y999 Unspecified external cause status: Secondary | ICD-10-CM | POA: Insufficient documentation

## 2018-03-27 DIAGNOSIS — Y9301 Activity, walking, marching and hiking: Secondary | ICD-10-CM | POA: Insufficient documentation

## 2018-03-27 DIAGNOSIS — S0181XA Laceration without foreign body of other part of head, initial encounter: Secondary | ICD-10-CM | POA: Diagnosis not present

## 2018-03-27 DIAGNOSIS — Z7982 Long term (current) use of aspirin: Secondary | ICD-10-CM | POA: Insufficient documentation

## 2018-03-27 DIAGNOSIS — I5032 Chronic diastolic (congestive) heart failure: Secondary | ICD-10-CM | POA: Diagnosis not present

## 2018-03-27 DIAGNOSIS — Y929 Unspecified place or not applicable: Secondary | ICD-10-CM | POA: Insufficient documentation

## 2018-03-27 DIAGNOSIS — Z87891 Personal history of nicotine dependence: Secondary | ICD-10-CM | POA: Insufficient documentation

## 2018-03-27 DIAGNOSIS — T148XXA Other injury of unspecified body region, initial encounter: Secondary | ICD-10-CM

## 2018-03-27 DIAGNOSIS — N183 Chronic kidney disease, stage 3 (moderate): Secondary | ICD-10-CM | POA: Insufficient documentation

## 2018-03-27 DIAGNOSIS — S12112A Nondisplaced Type II dens fracture, initial encounter for closed fracture: Secondary | ICD-10-CM | POA: Diagnosis not present

## 2018-03-27 DIAGNOSIS — S0990XA Unspecified injury of head, initial encounter: Secondary | ICD-10-CM | POA: Diagnosis present

## 2018-03-27 LAB — CBC WITH DIFFERENTIAL/PLATELET
Abs Immature Granulocytes: 0.03 10*3/uL (ref 0.00–0.07)
Basophils Absolute: 0.1 10*3/uL (ref 0.0–0.1)
Basophils Relative: 1 %
Eosinophils Absolute: 0.1 10*3/uL (ref 0.0–0.5)
Eosinophils Relative: 2 %
HCT: 30.3 % — ABNORMAL LOW (ref 39.0–52.0)
Hemoglobin: 9.3 g/dL — ABNORMAL LOW (ref 13.0–17.0)
IMMATURE GRANULOCYTES: 1 %
Lymphocytes Relative: 20 %
Lymphs Abs: 1.2 10*3/uL (ref 0.7–4.0)
MCH: 30 pg (ref 26.0–34.0)
MCHC: 30.7 g/dL (ref 30.0–36.0)
MCV: 97.7 fL (ref 80.0–100.0)
Monocytes Absolute: 0.7 10*3/uL (ref 0.1–1.0)
Monocytes Relative: 11 %
NRBC: 0 % (ref 0.0–0.2)
Neutro Abs: 4.1 10*3/uL (ref 1.7–7.7)
Neutrophils Relative %: 65 %
Platelets: 193 10*3/uL (ref 150–400)
RBC: 3.1 MIL/uL — ABNORMAL LOW (ref 4.22–5.81)
RDW: 16.4 % — ABNORMAL HIGH (ref 11.5–15.5)
WBC: 6.2 10*3/uL (ref 4.0–10.5)

## 2018-03-27 LAB — COMPREHENSIVE METABOLIC PANEL
ALBUMIN: 3.3 g/dL — AB (ref 3.5–5.0)
ALT: 5 U/L (ref 0–44)
AST: 22 U/L (ref 15–41)
Alkaline Phosphatase: 72 U/L (ref 38–126)
Anion gap: 6 (ref 5–15)
BUN: 50 mg/dL — ABNORMAL HIGH (ref 8–23)
CO2: 25 mmol/L (ref 22–32)
Calcium: 8.5 mg/dL — ABNORMAL LOW (ref 8.9–10.3)
Chloride: 109 mmol/L (ref 98–111)
Creatinine, Ser: 2.27 mg/dL — ABNORMAL HIGH (ref 0.61–1.24)
GFR calc Af Amer: 28 mL/min — ABNORMAL LOW (ref >60)
GFR calc non Af Amer: 24 mL/min — ABNORMAL LOW (ref >60)
GLUCOSE: 148 mg/dL — AB (ref 70–99)
Potassium: 3.7 mmol/L (ref 3.5–5.1)
SODIUM: 140 mmol/L (ref 135–145)
Total Bilirubin: 0.9 mg/dL (ref 0.3–1.2)
Total Protein: 6.3 g/dL — ABNORMAL LOW (ref 6.5–8.1)

## 2018-03-27 LAB — I-STAT TROPONIN, ED: Troponin i, poc: 0.03 ng/mL (ref 0.00–0.08)

## 2018-03-27 MED ORDER — MUPIROCIN CALCIUM 2 % EX CREA: 1.0000 "application " | TOPICAL_CREAM | Freq: Two times a day (BID) | CUTANEOUS | 0 refills | Status: DC

## 2018-03-27 MED ORDER — CLINDAMYCIN HCL 300 MG PO CAPS: 300.0000 mg | ORAL_CAPSULE | Freq: Three times a day (TID) | ORAL | 0 refills | Status: AC

## 2018-03-27 NOTE — Discharge Instructions (Addendum)
Neurosurgery will follow you up in the office.  They usually reevaluate this again in about 2 weeks but call on Monday to set up the appointment.  Referral information provided above.  Wound care for the skin tears as as noted.

## 2018-03-27 NOTE — ED Provider Notes (Addendum)
[facility] EMERGENCY DEPARTMENT Provider Note   CSN: [id number] Arrival date & time: 03/27/18  6160     History   Chief Complaint Chief Complaint  Patient presents with  . Altered Mental Status    HPI [name] is a 83 y.o. male with a PMH of CAD, HTN, CKD, and Parkinson's Disease presenting with a fall onset this morning while walking to the bathroom. Patient's son is a contributing historian. Patient has had about five falls in the last few years and last fall was in 07/2017. Patient states he does not remember what caused his fall. Patient reports hitting his head on the tile floor. Son reports patient possibly lost consciousness for 1 minute. Patient reports multiple lacerations on face and hands due to the fall. Son reports he had similar wounds with his last fall, and steri strips and sutures were not helpful and had to be removed. Son reports xeroform was the only treatment that helped his wounds. Patient takes 81mg  ASA, but no other anticoagulants. Son states his Sinemet was increased from BID to TID recently. Son also states patient had a teeth pulled last week and has been on antibiotics. Son reports his Torsemide was decreased from 1-2 pills to 0.5 pill for pedal edema. Patient reports diffuse weakness, but denies vision changes, chest pain, shortness of breath, fever, abdominal pain, nausea, vomiting, or diarrhea.   HPI  Past Medical History:  Diagnosis Date  . ASCVD (arteriosclerotic cardiovascular disease)     CABG in 04/1993; and negative stress nuclear study in 08/2001  . Benign essential tremor   . CAD (coronary artery disease)   . Cancer ([place])    skin  . Cerebrovascular disease    Right carotid bruit-40-69% left internal carotid artery stenosis in 4/06; followed [facility]  . Chronic kidney disease, stage II (mild)    Creatinine-1.6 in 09/2008  . COPD (chronic obstructive pulmonary disease) ([place])   . Degenerative joint disease of knee, left   . Gout   . Hiatal hernia    . Horseshoe kidney   . Hyperlipidemia   . Hypertension   . Lung cancer ([place]) 2013  . Normocytic anemia   . Prediabetes   . Pulmonary disease   . Renal insufficiency   . Syncope   . Tobacco abuse, in remission    40 pack years; discontinued in 1980    Patient Active Problem List   Diagnosis Date Noted  . Gout 01/14/2018  . Lobar pneumonia, unspecified organism ([place]) 10/29/2017  . Pleural effusion, right   . Pedal edema 05/14/2017  . Parkinson's disease ([place]) 10/05/2016  . Diastolic CHF, chronic ([place]) 05/10/2015  . Dizziness   . Coronary artery disease involving coronary bypass graft of native heart without angina pectoris   . Near syncope 04/02/2015  . Diarrhea 04/02/2015  . COPD exacerbation ([place]) 06/04/2013  . Shortness of breath 06/04/2013  . Acute respiratory failure with hypoxia ([place]) 06/04/2013  . Hemoptysis 08/10/2012  . Prediabetes 08/28/2011  . Lung cancer ([place]) 08/25/2011  . COPD (chronic obstructive pulmonary disease) ([place]) 08/25/2011  . CKD (chronic kidney disease), stage III ([place]) 12/07/2010  . Chest pain 08/18/2010  . Abdominal pain 08/18/2010  . Syncope   . Tobacco abuse, in remission   . Degenerative joint disease of knee, left   . COLONIC POLYPS 05/20/2009  . Hyperlipidemia 05/20/2009  . ANEMIA 05/20/2009  . Essential tremor 05/20/2009  . ATHEROSCLEROTIC CARDIOVASCULAR DISEASE 05/20/2009  . CEREBROVASCULAR DISEASE 05/20/2009  . Essential hypertension  01/14/2009    Past Surgical History:  Procedure Laterality Date  . CARDIAC SURGERY    . COLONOSCOPY W/ POLYPECTOMY  1985  . CORONARY ARTERY BYPASS GRAFT  1995  . LESION EXCISION    . PILONIDAL CYST EXCISION  1948        Home Medications    Prior to Admission medications   Medication Sig Start Date End Date Taking? Authorizing Provider  albuterol (PROVENTIL) (2.5 MG/3ML) 0.083% nebulizer solution Inhale 3 mLs into the lungs every 4 (four) hours as needed for wheezing or shortness of breath.  09/23/17  Yes Kathie Dike, MD  albuterol (VENTOLIN HFA) 108 (90 Base) MCG/ACT inhaler INHALE 2 PUFFS INTO THE LUNGS EVERY 6 HOURS AS NEEDED FOR SHORTNESS OF BREATH 07/13/17  Yes Kathyrn Drown, MD  Artificial Tear Solution (SOOTHE XP) SOLN Place 1-2 drops into both eyes 2 (two) times daily as needed (for dry eyes).    Yes [provider]  aspirin EC 81 MG tablet Take 81 mg by mouth daily.   Yes [provider]  atorvastatin (LIPITOR) 40 MG tablet TAKE 1 TABLET (40 MG TOTAL) BY MOUTH DAILY. 09/20/17  Yes Luking, Elayne Snare, MD  budesonide-formoterol (SYMBICORT) 160-4.5 MCG/ACT inhaler INHALE 2 PUFF INTO THE LUNGS 2 TIMES DAILY. 08/09/17  Yes Kathyrn Drown, MD  carbidopa-levodopa (SINEMET IR) 25-100 MG tablet Take three tablets by mouth three times daily 03/15/18  Yes Luking, Scott A, MD  docusate sodium (COLACE) 100 MG capsule Take 100 mg by mouth daily as needed for mild constipation.   Yes [provider]  guaiFENesin (MUCINEX) 600 MG 12 hr tablet Take 1 tablet (600 mg total) by mouth 2 (two) times daily. 09/23/17 09/23/18 Yes Kathie Dike, MD  metoprolol tartrate (LOPRESSOR) 25 MG tablet TAKE 1 TABLET BY MOUTH TWICE A DAY 01/05/18  Yes Luking, Elayne Snare, MD  Multiple Vitamins-Minerals (CENTRUM SILVER 50+MEN) TABS Take 1 tablet by mouth daily.   Yes [provider]  nitroGLYCERIN (NITROSTAT) 0.4 MG SL tablet Place 1 tablet (0.4 mg total) under the tongue every 5 (five) minutes as needed for chest pain. May take up to 3 doses per episode. 04/08/17  Yes Hongalgi, Lenis Dickinson, MD  pantoprazole (PROTONIX) 40 MG tablet TAKE 1 TABLET BY MOUTH DAILY. 01/31/18  Yes Kathyrn Drown, MD  tamsulosin (FLOMAX) 0.4 MG CAPS capsule TAKE ONE CAPSULE BY MOUTH AT BEDTIME. 03/01/18  Yes Luking, Scott A, MD  tiotropium (SPIRIVA) 18 MCG inhalation capsule Place 1 capsule (18 mcg total) into inhaler and inhale daily. 09/24/17  Yes Kathie Dike, MD  torsemide (DEMADEX) 20 MG tablet Take 1/2  tablet to one whole tablet prn  for pedal edema 03/23/18  Yes Luking, Elayne Snare, MD  clindamycin (CLEOCIN) 300 MG capsule Take 1 capsule (300 mg total) by mouth 3 (three) times daily for 7 days. 03/27/18 04/03/18  Darlin Drop P, PA-C  mupirocin cream (BACTROBAN) 2 % Apply 1 application topically 2 (two) times daily. 03/27/18   Arville Lime, PA-C    Family History Family History  Problem Relation Age of Onset  . Cancer Brother   . Heart attack Brother     Social History Social History   Tobacco Use  . Smoking status: Former Smoker    Packs/day: 1.00    Years: 40.00    Pack years: 40.00    Types: Cigarettes    Last attempt to quit: 08/14/1973    Years since quitting: 44.6  . Smokeless tobacco:  Former User    Types: Chew    Quit date: 12/05/1980  Substance Use Topics  . Alcohol use: No    Alcohol/week: 0.0 standard drinks  . Drug use: No     Allergies   Propranolol; Levaquin [levofloxacin in d5w]; Penicillins; Sulfonamide derivatives; Sympathomimetics; and Tape   Review of Systems Review of Systems  Constitutional: Negative for activity change, appetite change, chills, diaphoresis, fatigue, fever and unexpected weight change.  Eyes: Negative for visual disturbance.  Respiratory: Negative for cough and shortness of breath.   Cardiovascular: Negative for chest pain, palpitations and leg swelling.  Gastrointestinal: Negative for abdominal pain, blood in stool, diarrhea, nausea and vomiting.  Skin: Negative for pallor.  Allergic/Immunologic: Negative for immunocompromised state.  Neurological: Positive for syncope and weakness. Negative for dizziness, seizures, speech difficulty, numbness and headaches.  Hematological: Does not bruise/bleed easily.  Psychiatric/Behavioral: Negative for sleep disturbance. The patient is not nervous/anxious.      Physical Exam Updated Vital Signs BP (!) 162/70 (BP Location: Left Arm)   Pulse 72   Temp 97.7 F (36.5 C) (Oral)   Resp  20   Ht 5\' 7"  (1.702 m)   Wt 54.4 kg   SpO2 100%   BMI 18.79 kg/m   Physical Exam Vitals signs and nursing note reviewed.  Constitutional:      General: He is not in acute distress.    Appearance: He is well-developed. He is not diaphoretic.  HENT:     Head: Normocephalic.      Right Ear: Tympanic membrane, ear canal and external ear normal.     Left Ear: External ear normal.     Ears:      Nose:     Comments: Small laceration noted over nose.     Mouth/Throat:     Mouth: Mucous membranes are dry.     Pharynx: No posterior oropharyngeal erythema.  Eyes:     Conjunctiva/sclera: Conjunctivae normal.     Pupils: Pupils are equal, round, and reactive to light.  Neck:     Musculoskeletal: Neck supple.     Vascular: No carotid bruit.     Comments: Pt is wearing a cervical collar due to fall. Patient does not have midline or paraspinal cervical tenderness.  Cardiovascular:     Rate and Rhythm: Normal rate and regular rhythm.     Heart sounds: Normal heart sounds. No murmur. No friction rub. No gallop.   Pulmonary:     Effort: Pulmonary effort is normal. No respiratory distress.     Breath sounds: Normal breath sounds. No wheezing or rales.  Abdominal:     Palpations: Abdomen is soft.     Tenderness: There is no abdominal tenderness.  Musculoskeletal: Normal range of motion.     Right wrist: Normal. He exhibits normal range of motion, no tenderness and no bony tenderness.     Left wrist: Normal. He exhibits normal range of motion, no tenderness and no bony tenderness.     Right hip: Normal. He exhibits normal range of motion, normal strength and no tenderness.     Left hip: Normal. He exhibits normal range of motion, normal strength and no tenderness.  Skin:    General: Skin is warm.     Coloration: Skin is not pale.     Findings: No rash.       Neurological:     Mental Status: He is alert and oriented to person, place, and time.     Motor: No abnormal  muscle tone.      Deep Tendon Reflexes: Reflexes are normal and symmetric.     Reflex Scores:      Tricep reflexes are 2+ on the right side and 2+ on the left side.      Bicep reflexes are 2+ on the right side and 2+ on the left side.      Brachioradialis reflexes are 2+ on the right side and 2+ on the left side.      Patellar reflexes are 2+ on the right side and 2+ on the left side.      Achilles reflexes are 2+ on the right side and 2+ on the left side. Psychiatric:        Speech: Speech normal.        Behavior: Behavior normal.   Mental Status:  Alert, oriented, thought content appropriate, able to give a coherent history. Speech fluent without evidence of aphasia. Able to follow 2 step commands without difficulty.  Cranial Nerves:  II:  Peripheral visual fields grossly normal, pupils equal, round, reactive to light III,IV, VI: ptosis not present, extra-ocular motions intact bilaterally  V,VII: smile symmetric, facial light touch sensation equal VIII: hearing grossly normal to voice  X: uvula elevates symmetrically  XI: bilateral shoulder shrug symmetric and strong XII: midline tongue extension without fassiculations Motor:  Normal tone. 5/5 in upper and lower extremities bilaterally including strong and equal grip strength and dorsiflexion/plantar flexion Sensory: Pinprick and light touch normal in all extremities.  Deep Tendon Reflexes: 2+ and symmetric in the biceps and patella Cerebellar: normal finger-to-nose with bilateral upper extremities CV: distal pulses palpable throughout   ED Treatments / Results  Labs (all labs ordered are listed, but only abnormal results are displayed) Labs Reviewed  COMPREHENSIVE METABOLIC PANEL - Abnormal; Notable for the following components:      Result Value   Glucose, Bld 148 (*)    BUN 50 (*)    Creatinine, Ser 2.27 (*)    Calcium 8.5 (*)    Total Protein 6.3 (*)    Albumin 3.3 (*)    GFR calc non Af Amer 24 (*)    GFR calc Af Amer 28 (*)    All  other components within normal limits  CBC WITH DIFFERENTIAL/PLATELET - Abnormal; Notable for the following components:   RBC 3.10 (*)    Hemoglobin 9.3 (*)    HCT 30.3 (*)    RDW 16.4 (*)    All other components within normal limits  I-STAT TROPONIN, ED    EKG EKG Interpretation  Date/Time:  Sunday March 27 2018 07:42:32 EST Ventricular Rate:  70 PR Interval:    QRS Duration: 169 QT Interval:  489 QTC Calculation: 528 R Axis:   -132 Text Interpretation:  Sinus rhythm Right bundle branch block Confirmed by Fredia Sorrow [phone]) on 03/27/2018 8:22:03 AM   Radiology Dg Cervical Spine Complete  Result Date: 03/27/2018 CLINICAL DATA:  Blacked out.  Fall. EXAM: CERVICAL SPINE - COMPLETE 4+ VIEW COMPARISON:  CT scan March 10, 2011 FINDINGS: Dense calcifications in the neck or consistent with carotid calcifications. Minimal anterolisthesis of C2 versus C3 is unchanged and likely degenerative. Minimal anterolisthesis of C7 versus T1 is stable as well. No other malalignment identified. No fractures are seen. Multilevel degenerative disc disease. The neural foramina are patent. Chronic pleuroparenchymal thickening in the left apex is stable since at least December 2018. The superior most sternal wire is fractured, also stable. A dedicated odontoid view is limited.  IMPRESSION: 1. This study is significantly limited due to positioning and osteopenia. No definitive fracture or traumatic malalignment. However, if there is clinical concern for cervical spine fracture, a CT scan is recommended. 2. Dense carotid calcifications. 3. Chronic changes in the left apex of the lung. Electronically Signed   By: Dorise Bullion III M.D   On: 03/27/2018 09:16   Ct Head Wo Contrast  Result Date: 03/27/2018 CLINICAL DATA:  Fall after loss of consciousness.  Facial trauma. EXAM: CT HEAD WITHOUT CONTRAST CT MAXILLOFACIAL WITHOUT CONTRAST TECHNIQUE: Multidetector CT imaging of the head and maxillofacial  structures were performed using the standard protocol without intravenous contrast. Multiplanar CT image reconstructions of the maxillofacial structures were also generated. COMPARISON:  CT scan of July 28, 2016. FINDINGS: CT HEAD FINDINGS Brain: Mild diffuse cortical atrophy is noted. Mild chronic ischemic white matter disease is noted. No mass effect or midline shift is noted. Ventricular size is within normal limits. There is no evidence of mass lesion, hemorrhage or acute infarction. Vascular: No hyperdense vessel or unexpected calcification. Skull: Normal. Negative for fracture or focal lesion. Other: None. CT MAXILLOFACIAL FINDINGS Osseous: Mildly displaced type 2 odontoid fracture of C2 is noted. The mandible is unremarkable. No other bony abnormality is noted in maxillofacial region. Orbits: Negative. No traumatic or inflammatory finding. Sinuses: Clear. Soft tissues: Negative. IMPRESSION: Mild diffuse cortical atrophy. Mild chronic ischemic white matter disease. No acute intracranial abnormality seen. Mildly displaced type 2 odontoid fracture of C2 is noted. Critical Value/emergent results were called by telephone at the time of interpretation on 03/27/2018 at 9:29 am to Dr. Kris Mouton, who verbally acknowledged these results. No definite abnormality seen in maxillofacial region. Electronically Signed   By: Marijo Conception, M.D.   On: 03/27/2018 09:30   Ct Cervical Spine Wo Contrast  Result Date: 03/27/2018 CLINICAL DATA:  Was walking today and suffered a loss of consciousness, fall, multiple lacerations to face; history lung cancer, hypertension, coronary artery disease, former smoker; abnormal CT facial bones EXAM: CT CERVICAL SPINE WITHOUT CONTRAST TECHNIQUE: Multidetector CT imaging of the cervical spine was performed without intravenous contrast. Multiplanar CT image reconstructions were also generated. COMPARISON:  03/10/2011 CT cervical spine, chest radiograph 11/22/2017 FINDINGS: Alignment:  Mild anterolisthesis at C7-T1, unchanged. Remaining alignments normal Skull base and vertebrae: Diffuse osseous demineralization. Visualized skull base intact. Nondisplaced type II odontoid fracture, minimally oblique anterior to posterior. Prominent calcified pannus posterior to the odontoid process. Vertebral body heights maintained. No additional fracture, subluxation or bone destruction. Multilevel disc space narrowing and endplate spur formation. Multilevel facet degenerative changes bilaterally. Soft tissues and spinal canal: Prevertebral soft tissues normal thickness. Dense atherosclerotic calcifications aorta. Disc levels:  No additional abnormalities Upper chest: RIGHT lung apex clear. Opacified LEFT lung apex, corresponding to pleural fluid/thickening on prior chest radiographs, question sequela of treatment of lung cancer. Other: N/A IMPRESSION: Nondisplaced minimally oblique type II odontoid fracture. Osseous demineralization with multilevel degenerative disc and facet disease changes of the cervical spine. Pleural scarring and effusion at LEFT apex unchanged since prior chest radiograph, question sequela of treatment for lung cancer. Electronically Signed   By: Lavonia Dana M.D.   On: 03/27/2018 10:27   Ct Maxillofacial Wo Cm  Result Date: 03/27/2018 CLINICAL DATA:  Fall after loss of consciousness.  Facial trauma. EXAM: CT HEAD WITHOUT CONTRAST CT MAXILLOFACIAL WITHOUT CONTRAST TECHNIQUE: Multidetector CT imaging of the head and maxillofacial structures were performed using the standard protocol without intravenous contrast. Multiplanar CT image reconstructions of  the maxillofacial structures were also generated. COMPARISON:  CT scan of July 28, 2016. FINDINGS: CT HEAD FINDINGS Brain: Mild diffuse cortical atrophy is noted. Mild chronic ischemic white matter disease is noted. No mass effect or midline shift is noted. Ventricular size is within normal limits. There is no evidence of mass lesion,  hemorrhage or acute infarction. Vascular: No hyperdense vessel or unexpected calcification. Skull: Normal. Negative for fracture or focal lesion. Other: None. CT MAXILLOFACIAL FINDINGS Osseous: Mildly displaced type 2 odontoid fracture of C2 is noted. The mandible is unremarkable. No other bony abnormality is noted in maxillofacial region. Orbits: Negative. No traumatic or inflammatory finding. Sinuses: Clear. Soft tissues: Negative. IMPRESSION: Mild diffuse cortical atrophy. Mild chronic ischemic white matter disease. No acute intracranial abnormality seen. Mildly displaced type 2 odontoid fracture of C2 is noted. Critical Value/emergent results were called by telephone at the time of interpretation on 03/27/2018 at 9:29 am to Dr. Kris Mouton, who verbally acknowledged these results. No definite abnormality seen in maxillofacial region. Electronically Signed   By: Marijo Conception, M.D.   On: 03/27/2018 09:30    Procedures Procedures (including critical care time)  Medications Ordered in ED Medications - No data to display   Initial Impression / Assessment and Plan / ED Course  I have reviewed the triage vital signs and the nursing notes.  Pertinent labs & imaging results that were available during my care of the patient were reviewed by me and considered in my medical decision making (see chart for details).  Clinical Course as of Mar 27 1404  Sun Mar 27, 2018  1033 Nondisplaced minimally oblique type II odontoid fracture noted on CT cervical spine. Consulted neurosurgery.   CT Cervical Spine Wo Contrast [AH]  1056 No acute intracranial abnormality noted on head CT.   CT Head Wo Contrast [AH]  1057 CT Maxillofacial WO CM [AH]  1058 Patient is comfortable at this time and x ray/CT findings were discussed with family.    [AH]    Clinical Course User Index [AH] Arville Lime, [place]   Patient presents with complaint of fall and syncope. Patient nontoxic appearing, in no apparent distress,  vitals WNL, stable.   Assessment/Plan: Cleaned lacerations and provided xeroform and dressing over lacerations due to thin skin. Discussed wound care with son. Consulted neurosurgery due to odontoid type 2 fracture. Neurosurgery suggested to apply an aspen cervical collar and follow up outpatient. Consulted case management for home health outpatient follow up evaluation for wound care.   Doubt need for further emergent work up at this time. I discussed results, treatment plan, need for PCP follow-up, and return precautions to return to the ER including for any other new or worsening symptoms with the patient. Provided opportunity for questions, patient and family confirmed understanding and is in agreement with plan. I have answered their questions. Discharge instructions concerning home care and prescriptions have been given. The patient is STABLE and is discharged to home in good condition. Encouraged patient to follow up with PCP and have PCP obtain results of this visit in 2 or sooner if needed.    Final Clinical Impressions(s) / ED Diagnoses   Final diagnoses:  Fall, initial encounter  Multiple skin tears  Laceration of forehead, initial encounter  Closed nondisplaced odontoid fracture with type II morphology, initial encounter [facility])    ED Discharge Orders         Ordered    mupirocin cream (BACTROBAN) 2 %  2 times daily  03/27/18 1404    clindamycin (CLEOCIN) 300 MG capsule  3 times daily     03/27/18 1404           Arville Lime, [place] 03/27/18 1326    Darlin Drop [place], [place] 03/27/18 1405    Fredia Sorrow, MD 03/30/18 1052

## 2018-03-27 NOTE — ED Provider Notes (Addendum)
Medical screening examination/treatment/procedure(s) were conducted as a shared visit with non-physician practitioner(s) and myself.  I personally evaluated the patient during the encounter.  EKG Interpretation  Date/Time:  Sunday March 27 2018 07:42:32 EST Ventricular Rate:  70 PR Interval:    QRS Duration: 169 QT Interval:  489 QTC Calculation: 528 R Axis:   -132 Text Interpretation:  Sinus rhythm Right bundle branch block Confirmed by Fredia Sorrow [phone]) on 03/27/2018 8:22:03 AM   Patient seen by me along with physician assistant.  Patient had a fall does not sound like there was any syncope.  EMS talked about loss of consciousness family members are not sure that there was any loss of consciousness.  Patient remembers walking to the bathroom with his walker he had to reach for something when he reached he fell.  Patient with obvious skin tears to both hands.  And has forehead sort of stellate superficial laceration and some skin tears to the nose.  Recommend CT head and face.  Skin tears to the bilateral hands can be dressed with Xeroform.  Do not recommend suturing or Steri-Strips.  Patient does not appear to have any pain in his hips.  Based on range of motion of the hips.  Abdomen is soft nontender lungs are clear.  Heart regular EKG showed evidence of a right bundle branch block which is old.   Patient's had a history of falls.   Fredia Sorrow, MD  CT head showed evidence of an odontoid fracture.  Based on this patient was placed back in a collar and sent for cervical spine CT.  Which shows a type II odontoid fracture nondisplaced.  Discussed with neurosurgery Dr.Ostergaard he reviewed the CT stat studies.  Recommends an Aspen collar in the follow-up in the clinic. 03/27/18 6384    Fredia Sorrow, MD 03/27/18 1310

## 2018-03-27 NOTE — ED Triage Notes (Signed)
Pt was walking today and lost consciousness. Multiple lacerations to face as well as bilateral hands. C Collar applied by EMS. They also state when pt was in EMS he lost consciousness again. Daily aspirin use but no other blood thinners. NAD.

## 2018-03-27 NOTE — ED Notes (Signed)
Patient's dressing reinforced with kerlix on hands bilaterally.

## 2018-03-28 ENCOUNTER — Ambulatory Visit: Payer: Medicare Other | Admitting: Family Medicine

## 2018-03-28 VITALS — Ht 67.0 in | Wt 126.0 lb

## 2018-03-28 DIAGNOSIS — T148XXA Other injury of unspecified body region, initial encounter: Secondary | ICD-10-CM | POA: Diagnosis not present

## 2018-03-28 DIAGNOSIS — M79642 Pain in left hand: Secondary | ICD-10-CM

## 2018-03-28 DIAGNOSIS — S12100D Unspecified displaced fracture of second cervical vertebra, subsequent encounter for fracture with routine healing: Secondary | ICD-10-CM

## 2018-03-28 DIAGNOSIS — M79641 Pain in right hand: Secondary | ICD-10-CM

## 2018-03-28 DIAGNOSIS — R296 Repeated falls: Secondary | ICD-10-CM

## 2018-03-28 DIAGNOSIS — W01198A Fall on same level from slipping, tripping and stumbling with subsequent striking against other object, initial encounter: Secondary | ICD-10-CM

## 2018-03-28 NOTE — Progress Notes (Signed)
&lt;p id="[email or web address]-message"&gt; The page could not be loaded. The Medicare.gov Home page currently does not fully support browsers with &amp;quot;JavaScript&amp;quot; disabled. Please note that if you choose to continue without enabling &amp;quot;JavaScript&amp;quot; certain functionalities on this website may not be available. &lt;/p&gt;        Close window    Home health agencies that serve [phone]. Your favorite home health agencies  [place] of Patient Care Rating Patient Survey Summary Rating  [place]  [phone] 4 out of 5 stars 4 out of [place]  [phone] 3 out of 5 stars 5 out of [place]  [phone] 3 out of 5 stars 4 out of [place]  [phone] 3  out of 5 stars 4 out of [place]  [phone] 4  out of 5 stars 4 out of [place]  [phone] 4  out of 5 stars 4 out of [place]  [phone] 4  out of 5 stars 4 out of [place]  [phone] 4 out of 5 stars 4 out of [place] AGE  [phone] 3  out of 5 stars 4 out of 5 stars  [facility]  [phone] 4 out of 5 stars 4 out of [place]  [phone] 3 out of 5 stars 4 out of 5 stars  [facility]  [phone] 2  out of 5 stars 3 out of [place]  [phone] 3  out of 5 stars 3 out of [place]  [phone] Not Available4 Not [place]  [phone] 5 out of 5 stars 3 out of [place] number Footnote as displayed on [place]  1 This agency provides services under a federal waiver program to  non-traditional, chronic long term population.  2 This agency provides services to a special needs population.  3 Not Available.  4 The number of patient episodes for this measure is too small to report.  5 This measure currently does not have data or provider has been certified/recertified for less than 6 months.  6 Medicare is not displaying rates for this measure for any home health agency, because of an issue with the data.  7 Medicare is not displaying rates for this measure for any home health agency, because of an issue with the data.  8 There were problems with the data and they are being corrected.  9 Zero, or very few, patients met the survey's rules for inclusion. The scores shown, if any, reflect a very small number of surveys and may not accurately tell how an agency is doing.  10 Survey results are based on less than 12 months of data.  11 Fewer than 70 patients completed the survey. Use the scores shown, if any, with caution as the number of surveys may be too low to accurately tell how an agency is doing.  12 No survey results are available for this period.  13 Data suppressed by CMS for one or more  quarters.

## 2018-03-28 NOTE — Care Management Note (Signed)
Case Management Note  Patient Details  Name: [name] MRN: [id number] Date of Birth: 1922/04/02    Pt in ED for fall, cervical fx and skin tears. Pt from home. Referral for [facility]. No HH Ordered put in. CM received call from Daughter [name]) who asks that referral be sent to [facility]. CM discussed options and CMS star ratings. Pt going to see PCP this afternoon. CM has contacted pt's PCP and requested [place] orders be given. CM has given referral to [facility].                           Expected Discharge Plan:  [name]  In-House Referral:  NA  Discharge planning Services  CM Consult  Post Acute Care Choice:  Home Health Choice offered to:  Adult Children  HH Arranged:  RN, PT [place] Agency:  [place]  Status of Service:  Completed, signed off  Sherald Barge, RN 03/28/2018, 11:10 AM

## 2018-03-28 NOTE — Progress Notes (Signed)
   Subjective:    Patient ID: [name], male    DOB: Mar 08, 1922, 84 y.o.   MRN: [id number]  HPI  Patient arrives for a follow up from a recent ER visit for a fall. Patient will need home health consult for further treatment and care of skin tears on hands The son states that he passed out He was standing and fell to the ground Son did not witness it No seizure disorder with it no Seemingly doing fairly well currently Certainly very scary Multiple skin tears on the hands here today for dressing change and follow-up Also had a fracture of the odontoid is scheduled to see neurosurgery January 2 Review of Systems  Constitutional: Negative for activity change.  HENT: Negative for congestion and rhinorrhea.   Respiratory: Negative for cough and shortness of breath.   Cardiovascular: Negative for chest pain.  Gastrointestinal: Negative for abdominal pain, diarrhea, nausea and vomiting.  Genitourinary: Negative for dysuria and hematuria.  Musculoskeletal: Positive for back pain. Negative for arthralgias.  Neurological: Negative for weakness and headaches.  Psychiatric/Behavioral: Negative for behavioral problems and confusion.       Objective:   Physical Exam Vitals signs reviewed.  Cardiovascular:     Rate and Rhythm: Normal rate and regular rhythm.     Heart sounds: Normal heart sounds. No murmur.  Pulmonary:     Effort: Pulmonary effort is normal.     Breath sounds: Normal breath sounds.  Lymphadenopathy:     Cervical: No cervical adenopathy.  Neurological:     Mental Status: He is alert.  Psychiatric:        Behavior: Behavior normal.    Multiple skin tears on the hands he also had an incidental skin tear on the left elbow when standing up in the office  All of these were cleansed dressed and bandaged today       Assessment & Plan:  Multiple skin tears follow-up tomorrow for dressing change No sign of infection Use Bactroban ointment daily  Face-to-face  evaluation does need home physical therapy as well as home nursing evaluation and dressing changes  Cervical neck fracture to follow-up with neurosurgery as planned

## 2018-03-29 ENCOUNTER — Ambulatory Visit: Payer: Medicare Other | Admitting: Family Medicine

## 2018-03-29 DIAGNOSIS — M79642 Pain in left hand: Secondary | ICD-10-CM

## 2018-03-29 DIAGNOSIS — T148XXA Other injury of unspecified body region, initial encounter: Secondary | ICD-10-CM | POA: Diagnosis not present

## 2018-03-29 DIAGNOSIS — M79641 Pain in right hand: Secondary | ICD-10-CM

## 2018-03-29 DIAGNOSIS — S12100D Unspecified displaced fracture of second cervical vertebra, subsequent encounter for fracture with routine healing: Secondary | ICD-10-CM | POA: Diagnosis not present

## 2018-03-29 DIAGNOSIS — W01198D Fall on same level from slipping, tripping and stumbling with subsequent striking against other object, subsequent encounter: Secondary | ICD-10-CM

## 2018-03-29 NOTE — Progress Notes (Signed)
Referral put in to home health for pt for frequent falls and nursing for wound care

## 2018-03-29 NOTE — Progress Notes (Signed)
   Subjective:    Patient ID: [name], male    DOB: [birth year], 84 y.o.   MRN: [id number]  HPIpt arrives for dressing changes on both hands and on left elbow.  Patient is here today for recheck of skin tears Has multiple ones on his hand as well as elbow Family needed our help changing the dressings Also his neck brace is causing him pain in his back and this concerns him He does have a fracture of the neck but is seeing neurosurgery January 2  Also wants his neck brace adjusted.     Review of Systems Is relating pain from the brace Denies any other particular troubles Is here today for recheck of skin tears     Objective:   Physical Exam Multiple skin tears are noted left hand right hand left elbow also on his fingers   Padding recommended for his neck brace follow through with seeing neurosurgery    Assessment & Plan:  Time was taken to clean these areas Dressings were made on these Patient was encouraged to follow-up Home health will do some dressing changes Patient will see neurosurgery on January 2 Family will follow-up here if any particular problems are necessary

## 2018-03-29 NOTE — Addendum Note (Signed)
Addended by: Carmelina Noun on: 03/29/2018 08:39 AM   Modules accepted: Orders

## 2018-03-31 ENCOUNTER — Telehealth: Payer: Self-pay | Admitting: Family Medicine

## 2018-03-31 NOTE — Telephone Encounter (Signed)
[facility] came in today and recommend hospital bed for patient. Needing a prescription faxed over to [facility].

## 2018-03-31 NOTE — Telephone Encounter (Signed)
ok 

## 2018-04-01 NOTE — Telephone Encounter (Signed)
Script faxed to [facility]

## 2018-04-01 NOTE — Telephone Encounter (Signed)
Script printed out and awaiting signature.

## 2018-04-03 ENCOUNTER — Emergency Department (HOSPITAL_COMMUNITY)
Admission: EM | Admit: 2018-04-03 | Discharge: 2018-04-03 | Disposition: A | Discharge status: Home / Self Care | Payer: Medicare Other

## 2018-04-04 ENCOUNTER — Ambulatory Visit: Payer: Medicare Other | Admitting: Family Medicine

## 2018-04-04 DIAGNOSIS — S12100D Unspecified displaced fracture of second cervical vertebra, subsequent encounter for fracture with routine healing: Secondary | ICD-10-CM

## 2018-04-04 DIAGNOSIS — M542 Cervicalgia: Secondary | ICD-10-CM | POA: Diagnosis not present

## 2018-04-04 NOTE — Progress Notes (Signed)
   Subjective:    Patient ID: [name], male    DOB: 03/21/1922, 84 y.o.   MRN: [id number]  HPI Pt here today to have neck collar adjusted. Son states the collar has become loser and loser. Pt son went to [facility] last night to have collar readjusted.  Patient did have it adjusted where the chin was inside the brace now it is outside the brace and seems to be loose no neck pain or discomfort.  Patient does not always keep his head still and often turns at left or right or up or down  Review of Systems     Objective:   Physical Exam On examination no tenderness in the neck no sores noted The neck brace was readjusted 3 separate times in order to get good connection with the chest the back of the neck as well as to put pressure on the mandible in such a way to hold the head steady       Assessment & Plan:  Cervical neck fracture Continue to wear the brace until he sees neurosurgery in early January Try to avoid turning his neck is much as possible  Face-to-face evaluation Patient would benefit from a hospital bed Patient unable to do proper movements in the standard bed and needs the ability to adjust the head of bed as well as the height of the bed to allow him to function/ADLs/to get comfortable which is not feasible in a standard bed patient will need this hospital bed at least through treatment of his neck fracture over the next several months

## 2018-04-04 NOTE — Progress Notes (Signed)
Done and faxed to [facility] per [place].

## 2018-04-11 ENCOUNTER — Telehealth: Payer: Self-pay | Admitting: Family Medicine

## 2018-04-11 NOTE — Telephone Encounter (Signed)
Erline Levine, PT w/[facility] needs verbal 'OK" from [name] for PT once a week for 2-3 weeks  Please advise & call  [phone] - Jefm Petty, PT w/[facility]

## 2018-04-11 NOTE — Telephone Encounter (Signed)
Please advise 

## 2018-04-11 NOTE — Telephone Encounter (Signed)
Please go ahead give verbal order for physical therapy as requested

## 2018-04-11 NOTE — Telephone Encounter (Signed)
[name] is aware.

## 2018-04-12 ENCOUNTER — Telehealth: Payer: Self-pay | Admitting: Family Medicine

## 2018-04-12 ENCOUNTER — Other Ambulatory Visit: Payer: Self-pay | Admitting: Family Medicine

## 2018-04-12 NOTE — Telephone Encounter (Signed)
[facility] Verdis Frederickson) calling to get verbal order for two week wound care and two week physical therapy with patient.[phone])

## 2018-04-12 NOTE — Telephone Encounter (Signed)
Verbal order given to [facility]

## 2018-04-12 NOTE — Telephone Encounter (Signed)
Please go ahead and give verbal order for this thank you

## 2018-04-14 ENCOUNTER — Inpatient Hospital Stay (HOSPITAL_COMMUNITY)
Admission: EM | Admit: 2018-04-14 | Discharge: 2018-04-19 | DRG: 070 | Disposition: A | Discharge status: Skilled Nursing Facility | Payer: Medicare Other | Attending: Internal Medicine | Admitting: Internal Medicine

## 2018-04-14 ENCOUNTER — Other Ambulatory Visit: Payer: Self-pay

## 2018-04-14 ENCOUNTER — Observation Stay (HOSPITAL_COMMUNITY)
Admit: 2018-04-14 | Discharge: 2018-04-14 | Disposition: A | Discharge status: Home / Self Care | Payer: Medicare Other | Attending: Cardiovascular Disease | Admitting: Cardiovascular Disease

## 2018-04-14 ENCOUNTER — Emergency Department (HOSPITAL_COMMUNITY): Payer: Medicare Other

## 2018-04-14 ENCOUNTER — Observation Stay (HOSPITAL_COMMUNITY): Payer: Medicare Other

## 2018-04-14 ENCOUNTER — Encounter (HOSPITAL_COMMUNITY): Payer: Self-pay | Admitting: Emergency Medicine

## 2018-04-14 DIAGNOSIS — K5641 Fecal impaction: Secondary | ICD-10-CM | POA: Diagnosis present

## 2018-04-14 DIAGNOSIS — E860 Dehydration: Secondary | ICD-10-CM | POA: Diagnosis present

## 2018-04-14 DIAGNOSIS — Z882 Allergy status to sulfonamides status: Secondary | ICD-10-CM

## 2018-04-14 DIAGNOSIS — I951 Orthostatic hypotension: Secondary | ICD-10-CM | POA: Diagnosis present

## 2018-04-14 DIAGNOSIS — E785 Hyperlipidemia, unspecified: Secondary | ICD-10-CM | POA: Diagnosis present

## 2018-04-14 DIAGNOSIS — R338 Other retention of urine: Secondary | ICD-10-CM

## 2018-04-14 DIAGNOSIS — Z7951 Long term (current) use of inhaled steroids: Secondary | ICD-10-CM

## 2018-04-14 DIAGNOSIS — N183 Chronic kidney disease, stage 3 unspecified: Secondary | ICD-10-CM | POA: Diagnosis present

## 2018-04-14 DIAGNOSIS — Z66 Do not resuscitate: Secondary | ICD-10-CM | POA: Diagnosis not present

## 2018-04-14 DIAGNOSIS — F0280 Dementia in other diseases classified elsewhere without behavioral disturbance: Secondary | ICD-10-CM | POA: Diagnosis present

## 2018-04-14 DIAGNOSIS — G9341 Metabolic encephalopathy: Principal | ICD-10-CM | POA: Diagnosis present

## 2018-04-14 DIAGNOSIS — R54 Age-related physical debility: Secondary | ICD-10-CM | POA: Diagnosis present

## 2018-04-14 DIAGNOSIS — I5032 Chronic diastolic (congestive) heart failure: Secondary | ICD-10-CM | POA: Diagnosis present

## 2018-04-14 DIAGNOSIS — D631 Anemia in chronic kidney disease: Secondary | ICD-10-CM | POA: Diagnosis present

## 2018-04-14 DIAGNOSIS — R55 Syncope and collapse: Secondary | ICD-10-CM | POA: Diagnosis not present

## 2018-04-14 DIAGNOSIS — Z515 Encounter for palliative care: Secondary | ICD-10-CM

## 2018-04-14 DIAGNOSIS — Z7189 Other specified counseling: Secondary | ICD-10-CM

## 2018-04-14 DIAGNOSIS — I10 Essential (primary) hypertension: Secondary | ICD-10-CM | POA: Diagnosis present

## 2018-04-14 DIAGNOSIS — Z7982 Long term (current) use of aspirin: Secondary | ICD-10-CM

## 2018-04-14 DIAGNOSIS — N1330 Unspecified hydronephrosis: Secondary | ICD-10-CM

## 2018-04-14 DIAGNOSIS — G20 Parkinson's disease: Secondary | ICD-10-CM | POA: Diagnosis present

## 2018-04-14 DIAGNOSIS — R296 Repeated falls: Secondary | ICD-10-CM | POA: Diagnosis present

## 2018-04-14 DIAGNOSIS — Z888 Allergy status to other drugs, medicaments and biological substances status: Secondary | ICD-10-CM

## 2018-04-14 DIAGNOSIS — I130 Hypertensive heart and chronic kidney disease with heart failure and stage 1 through stage 4 chronic kidney disease, or unspecified chronic kidney disease: Secondary | ICD-10-CM | POA: Diagnosis present

## 2018-04-14 DIAGNOSIS — Z79899 Other long term (current) drug therapy: Secondary | ICD-10-CM

## 2018-04-14 DIAGNOSIS — Z880 Allergy status to penicillin: Secondary | ICD-10-CM

## 2018-04-14 DIAGNOSIS — M109 Gout, unspecified: Secondary | ICD-10-CM | POA: Diagnosis present

## 2018-04-14 DIAGNOSIS — Z87891 Personal history of nicotine dependence: Secondary | ICD-10-CM

## 2018-04-14 DIAGNOSIS — Z881 Allergy status to other antibiotic agents status: Secondary | ICD-10-CM

## 2018-04-14 DIAGNOSIS — Z8249 Family history of ischemic heart disease and other diseases of the circulatory system: Secondary | ICD-10-CM

## 2018-04-14 DIAGNOSIS — Q631 Lobulated, fused and horseshoe kidney: Secondary | ICD-10-CM

## 2018-04-14 DIAGNOSIS — Z85118 Personal history of other malignant neoplasm of bronchus and lung: Secondary | ICD-10-CM

## 2018-04-14 DIAGNOSIS — I25810 Atherosclerosis of coronary artery bypass graft(s) without angina pectoris: Secondary | ICD-10-CM | POA: Diagnosis present

## 2018-04-14 DIAGNOSIS — R627 Adult failure to thrive: Secondary | ICD-10-CM

## 2018-04-14 DIAGNOSIS — E43 Unspecified severe protein-calorie malnutrition: Secondary | ICD-10-CM | POA: Diagnosis present

## 2018-04-14 DIAGNOSIS — Z681 Body mass index (BMI) 19 or less, adult: Secondary | ICD-10-CM

## 2018-04-14 DIAGNOSIS — G250 Essential tremor: Secondary | ICD-10-CM | POA: Diagnosis present

## 2018-04-14 DIAGNOSIS — J449 Chronic obstructive pulmonary disease, unspecified: Secondary | ICD-10-CM | POA: Diagnosis present

## 2018-04-14 DIAGNOSIS — Z85828 Personal history of other malignant neoplasm of skin: Secondary | ICD-10-CM

## 2018-04-14 LAB — CBC WITH DIFFERENTIAL/PLATELET
Abs Immature Granulocytes: 0.02 10*3/uL (ref 0.00–0.07)
BASOS PCT: 0 %
Basophils Absolute: 0 10*3/uL (ref 0.0–0.1)
EOS ABS: 0 10*3/uL (ref 0.0–0.5)
Eosinophils Relative: 0 %
HCT: 30.1 % — ABNORMAL LOW (ref 39.0–52.0)
Hemoglobin: 9.4 g/dL — ABNORMAL LOW (ref 13.0–17.0)
Immature Granulocytes: 0 %
Lymphocytes Relative: 13 %
Lymphs Abs: 1.2 10*3/uL (ref 0.7–4.0)
MCH: 30.6 pg (ref 26.0–34.0)
MCHC: 31.2 g/dL (ref 30.0–36.0)
MCV: 98 fL (ref 80.0–100.0)
MONOS PCT: 9 %
Monocytes Absolute: 0.8 10*3/uL (ref 0.1–1.0)
NEUTROS PCT: 78 %
Neutro Abs: 7 10*3/uL (ref 1.7–7.7)
Platelets: 290 10*3/uL (ref 150–400)
RBC: 3.07 MIL/uL — ABNORMAL LOW (ref 4.22–5.81)
RDW: 15.9 % — ABNORMAL HIGH (ref 11.5–15.5)
WBC: 9.1 10*3/uL (ref 4.0–10.5)
nRBC: 0 % (ref 0.0–0.2)

## 2018-04-14 LAB — COMPREHENSIVE METABOLIC PANEL
ALT: 5 U/L (ref 0–44)
AST: 20 U/L (ref 15–41)
Albumin: 3.3 g/dL — ABNORMAL LOW (ref 3.5–5.0)
Alkaline Phosphatase: 81 U/L (ref 38–126)
Anion gap: 7 (ref 5–15)
BUN: 43 mg/dL — AB (ref 8–23)
CO2: 23 mmol/L (ref 22–32)
CREATININE: 1.85 mg/dL — AB (ref 0.61–1.24)
Calcium: 8.8 mg/dL — ABNORMAL LOW (ref 8.9–10.3)
Chloride: 106 mmol/L (ref 98–111)
GFR calc Af Amer: 35 mL/min — ABNORMAL LOW (ref >60)
GFR, EST NON AFRICAN AMERICAN: 30 mL/min — AB (ref >60)
Glucose, Bld: 108 mg/dL — ABNORMAL HIGH (ref 70–99)
Potassium: 4.1 mmol/L (ref 3.5–5.1)
Sodium: 136 mmol/L (ref 135–145)
Total Bilirubin: 0.5 mg/dL (ref 0.3–1.2)
Total Protein: 6.2 g/dL — ABNORMAL LOW (ref 6.5–8.1)

## 2018-04-14 LAB — URINALYSIS, COMPLETE (UACMP) WITH MICROSCOPIC
Bacteria, UA: NONE SEEN
Bilirubin Urine: NEGATIVE
Glucose, UA: NEGATIVE mg/dL
KETONES UR: NEGATIVE mg/dL
Leukocytes, UA: NEGATIVE
Nitrite: NEGATIVE
PROTEIN: NEGATIVE mg/dL
Specific Gravity, Urine: 1.021 (ref 1.005–1.030)
pH: 5 (ref 5.0–8.0)

## 2018-04-14 LAB — AMMONIA: Ammonia: 9 umol/L (ref 9–35)

## 2018-04-14 LAB — FOLATE: Folate: 23.8 ng/mL (ref >5.9)

## 2018-04-14 LAB — TROPONIN I

## 2018-04-14 LAB — TSH: TSH: 7.565 u[IU]/mL — ABNORMAL HIGH (ref 0.350–4.500)

## 2018-04-14 LAB — POC OCCULT BLOOD, ED: FECAL OCCULT BLD: POSITIVE — AB

## 2018-04-14 LAB — VITAMIN B12: Vitamin B-12: 1236 pg/mL — ABNORMAL HIGH (ref 180–914)

## 2018-04-14 MED ORDER — ADULT MULTIVITAMIN W/MINERALS CH
1.0000 | ORAL_TABLET | Freq: Every day | ORAL | Status: DC
  Administered 2018-04-14 – 2018-04-19 (×6): 1 via ORAL
  Filled 2018-04-14 (×6): qty 1

## 2018-04-14 MED ORDER — ONDANSETRON HCL 4 MG PO TABS: 4.0000 mg | ORAL_TABLET | Freq: Four times a day (QID) | ORAL | Status: DC | PRN

## 2018-04-14 MED ORDER — TAMSULOSIN HCL 0.4 MG PO CAPS
0.4000 mg | ORAL_CAPSULE | Freq: Every day | ORAL | Status: DC
  Administered 2018-04-14 – 2018-04-18 (×5): 0.4 mg via ORAL
  Filled 2018-04-14 (×5): qty 1

## 2018-04-14 MED ORDER — ATORVASTATIN CALCIUM 40 MG PO TABS
40.0000 mg | ORAL_TABLET | Freq: Every day | ORAL | Status: DC
  Administered 2018-04-14 – 2018-04-18 (×5): 40 mg via ORAL
  Filled 2018-04-14 (×6): qty 1

## 2018-04-14 MED ORDER — BISACODYL 10 MG RE SUPP
10.0000 mg | Freq: Once | RECTAL | Status: AC
  Administered 2018-04-14: 10 mg via RECTAL
  Filled 2018-04-14: qty 1

## 2018-04-14 MED ORDER — SENNA 8.6 MG PO TABS
2.0000 | ORAL_TABLET | Freq: Every day | ORAL | Status: DC
  Filled 2018-04-14: qty 2

## 2018-04-14 MED ORDER — THIAMINE HCL 100 MG/ML IJ SOLN
100.0000 mg | Freq: Once | INTRAMUSCULAR | Status: AC
  Administered 2018-04-14: 100 mg via INTRAVENOUS
  Filled 2018-04-14: qty 2

## 2018-04-14 MED ORDER — ASPIRIN EC 81 MG PO TBEC
81.0000 mg | DELAYED_RELEASE_TABLET | Freq: Every day | ORAL | Status: DC
  Administered 2018-04-15 – 2018-04-19 (×5): 81 mg via ORAL
  Filled 2018-04-14 (×6): qty 1

## 2018-04-14 MED ORDER — IPRATROPIUM-ALBUTEROL 0.5-2.5 (3) MG/3ML IN SOLN
3.0000 mL | Freq: Four times a day (QID) | RESPIRATORY_TRACT | Status: DC
  Administered 2018-04-14 (×2): 3 mL via RESPIRATORY_TRACT
  Filled 2018-04-14 (×3): qty 3

## 2018-04-14 MED ORDER — ONDANSETRON HCL 4 MG/2ML IJ SOLN: 4.0000 mg | Freq: Four times a day (QID) | INTRAMUSCULAR | Status: DC | PRN

## 2018-04-14 MED ORDER — PANTOPRAZOLE SODIUM 40 MG PO TBEC
40.0000 mg | DELAYED_RELEASE_TABLET | Freq: Every day | ORAL | Status: DC
  Administered 2018-04-14 – 2018-04-19 (×6): 40 mg via ORAL
  Filled 2018-04-14 (×6): qty 1

## 2018-04-14 MED ORDER — ACETAMINOPHEN 325 MG PO TABS
650.0000 mg | ORAL_TABLET | Freq: Four times a day (QID) | ORAL | Status: DC | PRN
  Administered 2018-04-16: 650 mg via ORAL
  Filled 2018-04-14: qty 2

## 2018-04-14 MED ORDER — SODIUM CHLORIDE 0.9 % IV SOLN
INTRAVENOUS | Status: DC
  Administered 2018-04-14: 15:00:00 via INTRAVENOUS

## 2018-04-14 MED ORDER — SODIUM CHLORIDE 0.9 % IV BOLUS
1000.0000 mL | Freq: Once | INTRAVENOUS | Status: AC
  Administered 2018-04-14: 1000 mL via INTRAVENOUS

## 2018-04-14 MED ORDER — CARBIDOPA-LEVODOPA 25-100 MG PO TABS
3.0000 | ORAL_TABLET | Freq: Three times a day (TID) | ORAL | Status: DC
  Administered 2018-04-14 – 2018-04-19 (×15): 3 via ORAL
  Filled 2018-04-14 (×16): qty 3

## 2018-04-14 MED ORDER — POLYETHYLENE GLYCOL 3350 17 G PO PACK
17.0000 g | PACK | Freq: Every day | ORAL | Status: DC
  Administered 2018-04-14: 17 g via ORAL
  Filled 2018-04-14 (×2): qty 1

## 2018-04-14 MED ORDER — MOMETASONE FURO-FORMOTEROL FUM 200-5 MCG/ACT IN AERO
2.0000 | INHALATION_SPRAY | Freq: Two times a day (BID) | RESPIRATORY_TRACT | Status: DC
  Administered 2018-04-14 – 2018-04-19 (×10): 2 via RESPIRATORY_TRACT
  Filled 2018-04-14: qty 8.8

## 2018-04-14 MED ORDER — IOHEXOL 300 MG/ML  SOLN
75.0000 mL | Freq: Once | INTRAMUSCULAR | Status: AC | PRN
  Administered 2018-04-14: 75 mL via INTRAVENOUS

## 2018-04-14 MED ORDER — GUAIFENESIN ER 600 MG PO TB12
600.0000 mg | ORAL_TABLET | Freq: Two times a day (BID) | ORAL | Status: DC
  Administered 2018-04-14 – 2018-04-19 (×10): 600 mg via ORAL
  Filled 2018-04-14 (×11): qty 1

## 2018-04-14 MED ORDER — SENNA 8.6 MG PO TABS
2.0000 | ORAL_TABLET | Freq: Two times a day (BID) | ORAL | Status: DC
  Administered 2018-04-14 – 2018-04-16 (×4): 17.2 mg via ORAL
  Filled 2018-04-14 (×6): qty 2

## 2018-04-14 MED ORDER — METOPROLOL TARTRATE 25 MG PO TABS
25.0000 mg | ORAL_TABLET | Freq: Two times a day (BID) | ORAL | Status: DC
  Administered 2018-04-14 – 2018-04-19 (×10): 25 mg via ORAL
  Filled 2018-04-14 (×10): qty 1

## 2018-04-14 MED ORDER — HEPARIN SODIUM (PORCINE) 5000 UNIT/ML IJ SOLN
5000.0000 [IU] | Freq: Three times a day (TID) | INTRAMUSCULAR | Status: DC
  Administered 2018-04-14 – 2018-04-19 (×15): 5000 [IU] via SUBCUTANEOUS
  Filled 2018-04-14 (×16): qty 1

## 2018-04-14 MED ORDER — ACETAMINOPHEN 650 MG RE SUPP: 650.0000 mg | Freq: Four times a day (QID) | RECTAL | Status: DC | PRN

## 2018-04-14 NOTE — Procedures (Addendum)
History: 85 year old male being evaluated for altered mental status  Sedation: None  Technique: This is a 21 channel routine scalp EEG performed at the bedside with bipolar and monopolar montages arranged in accordance to the international 10/20 system of electrode placement. One channel was dedicated to EKG recording.    Background: There is slowing of the posterior dominant rhythm with a frequency of 7 Hz.  This is well organized and reactive to eye opening and eye closure.  There is prominent muscle activity throughout the study, but no clearly abnormal epileptiform discharges were seen.  Photic stimulation: Physiologic driving is performed  EEG Abnormalities: 1) slow posterior dominant rhythm  Clinical Interpretation: This borderline EEG is consistent with mild cerebral dysfunction as can be seen in neurodegenerative disease such as dementia, though this can also sometimes be seen in healthy elderly.   There was no seizure or seizure predisposition recorded on this study. Please note that lack of epileptiform activity on EEG does not preclude the possibility of epilepsy.   Roland Rack, MD [facility] [phone]  If 7pm- 7am, please page neurology on call as listed in [place].

## 2018-04-14 NOTE — H&P (Signed)
History and Physical  [name] RIBAUDO JKK:[id number] DOB: [birth year] DOA: 04/14/2018   PCP: Kathyrn Drown, MD   Patient coming from: Home  Chief Complaint: Altered mental status  HPI:  [name] is a 85 y.o. male with medical history of coronary artery disease, COPD, Parkinson's disease, chronic diastolic CHF, horseshoe kidney, hypertension, hyperlipidemia, squamous cell lung cancer, and cognitive impairment presenting with altered mental status.  The patient has been having mechanical falls for the last several months.  The patient had a fall approximately 2 weeks prior to this admission.  The patient's son brought the patient to the emergency department on 03/27/2018.  Work-up at that time revealed that the patient had an odontoid fracture.  Neurosurgery was consulted at that time and felt the patient would benefit from nonoperative management.  The patient was placed in an Aspen collar and discharged home.  Since that time, the patient has rarely used hydrocodone for pain.  In fact, the patient is only taking 2 x 1/2 tablets of hydrocodone.  There have been no other new medications.  Since his fall 2 weeks prior to this admission, the patient's son states that the patient has had a cognitive decline and functional decline worse than his baseline.  He has been having more confusion and wandering.  However further history from the son reveals that the patient has had a gradual cognitive and functional decline over the past year.  The patient's son states that he has been having syncopal episodes, at least 5 since his ED visit.  However further history suggest that these may be mechanical falls or may be due to gait instability.  The patient's son states that the patient is usually falling when repositioning his walker, and the patient falls back onto the sons hands during which time the son feels like the patient may have been "out" for only a few seconds.  However, the patient remains conversant  and answers appropriately.  There is no bowel bladder incontinence or tongue biting. Nevertheless, the patient denies any fevers, chills, chest discomfort, nausea, vomiting, diarrhea.  The patient has had cough producing yellowish sputum.  His shortness of breath is unchanged.  The patient has been complaining of some lower abdominal pain.  His last bowel movement was on 04/10/2018.  They have only been using cathartics on a as needed basis at home.  There is been no complaints of dysuria, hematuria, hematochezia, melena, headaches, visual disturbance, focal extremity weakness. In the emergency department, the patient was afebrile hemodynamically stable saturating 99% room air.  BMP was essentially unremarkable with a serum creatinine at baseline at 1.85.  LFTs were unremarkable.  CBC showed hemoglobin 9.4 which is at the patient's baseline.  CT of the abdomen and pelvis showed bilateral hydronephrosis likely secondary to a very distended urinary bladder.  There was formed stool throughout much of the colon most notably distending the rectum up to 10 cm.  There is no other acute findings.  FOBT in the emergency department showed brown stool that was soft without blood that was Hemoccult positive.  Because of the patient's dehydration, failure to thrive, and "syncopal episodes" the patient was admitted for further evaluation.  Assessment/Plan: Acute metabolic encephalopathy -Likely multifactorial including recent use of opioids, constipation, urinary retention, and dehydration -Obtain urinalysis and urine culture -Serum Z16 -Folic acid -TSH -Ammonia -The patient likely has continued progression of his underlying cognitive impairment  Questionable syncopal episodes -Clinical history does not suggest true syncope -  echo -Orthostatic vital signs -EEG -The patient likely has a component of dysautonomia from his Parkinson's disease  Failure to thrive -Dietitian consult -Liberalize diet -IV  fluids -Work-up as above -Palliative medicine consultation for goals of care  Fecal impaction -Start MiraLAX and bisacodyl -Disimpaction  COPD -Stable on room air -Start duo nebs -Continue Symbicort  Urinary retention -Likely secondary to the patient's stool impaction and severe constipation -Foley catheter placed in the emergency department -Plan for voiding trial once the patient's constipation is improved -Continue tamsulosin  Coronary artery disease -Stable presently without any chest pain -EKG without concerning ischemic changes -Continue aspirin, Lipitor, metoprolol  Parkinson's disease -Was followed by neurology at [name]- -continue Sinemet        Past Medical History:  Diagnosis Date  . ASCVD (arteriosclerotic cardiovascular disease)     CABG in 04/1993; and negative stress nuclear study in 08/2001  . Benign essential tremor   . CAD (coronary artery disease)   . Cancer ([place])    skin  . Cerebrovascular disease    Right carotid bruit-40-69% left internal carotid artery stenosis in 4/06; followed [facility]  . Chronic kidney disease, stage II (mild)    Creatinine-1.6 in 09/2008  . COPD (chronic obstructive pulmonary disease) ([place])   . Degenerative joint disease of knee, left   . Gout   . Hiatal hernia   . Horseshoe kidney   . Hyperlipidemia   . Hypertension   . Lung cancer ([place]) 2013  . Normocytic anemia   . Prediabetes   . Pulmonary disease   . Renal insufficiency   . Syncope   . Tobacco abuse, in remission    40 pack years; discontinued in 1980   Past Surgical History:  Procedure Laterality Date  . CARDIAC SURGERY    . COLONOSCOPY W/ POLYPECTOMY  1985  . CORONARY ARTERY BYPASS GRAFT  1995  . LESION EXCISION    . PILONIDAL CYST EXCISION  1948   Social History:  reports that he quit smoking about 44 years ago. His smoking use included cigarettes. He has a 40.00 pack-year smoking history. He quit smokeless tobacco use about 37 years ago.  His smokeless  tobacco use included chew. He reports that he does not drink alcohol or use drugs.   Family History  Problem Relation Age of Onset  . Cancer Brother   . Heart attack Brother      Allergies  Allergen Reactions  . Propranolol Nausea Only  . Levaquin [Levofloxacin In D5w] Hives and Nausea And Vomiting  . Penicillins Hives and Swelling    Patient has tolerated cephalosporins several times  . Sulfonamide Derivatives Hives and Swelling  . Sympathomimetics Other (See Comments)  . Tape Other (See Comments)    SKIN IS VERY THIN AND TEARS EASILY; PLEASE USE AN ALTERNATIVE TO TAPE!!     Prior to Admission medications   Medication Sig Start Date End Date Taking? Authorizing Provider  albuterol (PROVENTIL) (2.5 MG/3ML) 0.083% nebulizer solution Inhale 3 mLs into the lungs every 4 (four) hours as needed for wheezing or shortness of breath. 09/23/17  Yes Kathie Dike, MD  albuterol (VENTOLIN HFA) 108 (90 Base) MCG/ACT inhaler INHALE 2 PUFFS INTO THE LUNGS EVERY 6 HOURS AS NEEDED FOR SHORTNESS OF BREATH 07/13/17  Yes Kathyrn Drown, MD  Artificial Tear Solution (SOOTHE XP) SOLN Place 1-2 drops into both eyes 2 (two) times daily as needed (for dry eyes).    Yes [provider]  aspirin EC 81 MG tablet Take  81 mg by mouth daily.   Yes [provider]  atorvastatin (LIPITOR) 40 MG tablet TAKE 1 TABLET (40 MG TOTAL) BY MOUTH DAILY. 09/20/17  Yes Luking, Elayne Snare, MD  budesonide-formoterol (SYMBICORT) 160-4.5 MCG/ACT inhaler INHALE 2 PUFF INTO THE LUNGS 2 TIMES DAILY. 08/09/17  Yes Kathyrn Drown, MD  carbidopa-levodopa (SINEMET IR) 25-100 MG tablet Take three tablets by mouth three times daily 03/15/18  Yes Luking, Scott A, MD  docusate sodium (COLACE) 100 MG capsule Take 100 mg by mouth daily as needed for mild constipation.   Yes [provider]  guaiFENesin (MUCINEX) 600 MG 12 hr tablet Take 1 tablet (600 mg total) by mouth 2 (two) times daily. 09/23/17 09/23/18 Yes Kathie Dike, MD  metoprolol tartrate (LOPRESSOR) 25 MG tablet TAKE 1 TABLET BY MOUTH TWICE A DAY 01/05/18  Yes Luking, Elayne Snare, MD  Multiple Vitamins-Minerals (CENTRUM SILVER 50+MEN) TABS Take 1 tablet by mouth daily.   Yes [provider]  mupirocin cream (BACTROBAN) 2 % Apply 1 application topically 2 (two) times daily. 03/27/18  Yes Hernandez, Ana P, PA-C  mupirocin ointment (BACTROBAN) 2 % APPLY TO SKIN 2 TIMES A DAY. 04/12/18  Yes Luking, Elayne Snare, MD  nitroGLYCERIN (NITROSTAT) 0.4 MG SL tablet Place 1 tablet (0.4 mg total) under the tongue every 5 (five) minutes as needed for chest pain. May take up to 3 doses per episode. 04/08/17  Yes Hongalgi, Lenis Dickinson, MD  pantoprazole (PROTONIX) 40 MG tablet TAKE 1 TABLET BY MOUTH DAILY. 01/31/18  Yes Kathyrn Drown, MD  senna (SENOKOT) 8.6 MG tablet Take 2 tablets by mouth daily.   Yes [provider]  tamsulosin (FLOMAX) 0.4 MG CAPS capsule TAKE ONE CAPSULE BY MOUTH AT BEDTIME. 03/01/18  Yes Luking, Elayne Snare, MD  tiotropium (SPIRIVA) 18 MCG inhalation capsule Place 1 capsule (18 mcg total) into inhaler and inhale daily. 09/24/17  Yes Kathie Dike, MD  torsemide (DEMADEX) 20 MG tablet Take 1/2 tablet to one whole tablet prn  for pedal edema 03/23/18  Yes Luking, Elayne Snare, MD    Review of Systems:  Constitutional:  No weight loss, night sweats, Fevers, chills Head&Eyes: No headache.  No vision loss.  No eye pain or scotoma ENT:  No Difficulty swallowing,Tooth/dental problems,Sore throat,  No ear ache, post nasal drip,  Cardio-vascular:  No chest pain, Orthopnea, PND, swelling in lower extremities,  dizziness, palpitations  GI:  No  nausea, vomiting, diarrhea, loss of appetite, hematochezia, melena, heartburn, indigestion, Resp:   No coughing up of blood .No wheezing.No chest wall deformity  Skin:  no rash or lesions.  GU:  no dysuria, change in color of urine, no urgency or frequency. No flank pain.  Musculoskeletal:  No joint  pain or swelling. No decreased range of motion. No back pain.  Psych:   No depression or anxiety. Neurologic: No headache, no dysesthesia, no focal weakness, no vision loss. No syncope  Physical Exam: Vitals:   04/14/18 0740 04/14/18 0800 04/14/18 0930 04/14/18 1000  BP: (!) 149/68 (!) 155/66 (!) 186/76 (!) 148/73  Pulse: 66 69 70 81  Resp: 17 15 16  (!) 25  Temp:      TempSrc:      SpO2: 100% 100% 99% 100%  Weight:      Height:       General:  A&O x 2, NAD, nontoxic, pleasant/cooperative Head/Eye: No conjunctival hemorrhage, no icterus, [place]/AT, No nystagmus ENT:  No icterus,  No thrush, good dentition,  no pharyngeal exudate Neck:  No masses, no lymphadenpathy, no bruits CV:  RRR, no rub, no gallop, no S3 Lung: Scattered bilateral rales.  No wheezing.  Good air movement. Abdomen: soft/NT, +BS, nondistended, no peritoneal signs Ext: No cyanosis, No rashes, No petechiae, No lymphangitis, No edema Neuro: CNII-XII intact, strength 4/5 in bilateral upper and lower extremities, no dysmetria  Labs on Admission:  Basic Metabolic Panel: Recent Labs  Lab 04/14/18 0832  NA 136  K 4.1  CL 106  CO2 23  GLUCOSE 108*  BUN 43*  CREATININE 1.85*  CALCIUM 8.8*   Liver Function Tests: Recent Labs  Lab 04/14/18 0832  AST 20  ALT 5  ALKPHOS 81  BILITOT 0.5  PROT 6.2*  ALBUMIN 3.3*   No results for input(s): LIPASE, AMYLASE in the last 168 hours. No results for input(s): AMMONIA in the last 168 hours. CBC: Recent Labs  Lab 04/14/18 0832  WBC 9.1  NEUTROABS 7.0  HGB 9.4*  HCT 30.1*  MCV 98.0  PLT 290   Coagulation Profile: No results for input(s): INR, PROTIME in the last 168 hours. Cardiac Enzymes: Recent Labs  Lab 04/14/18 0832  TROPONINI <0.03   BNP: Invalid input(s): POCBNP CBG: No results for input(s): GLUCAP in the last 168 hours. Urine analysis:    Component Value Date/Time   COLORURINE STRAW (A) 10/29/2017 1038   APPEARANCEUR CLEAR 10/29/2017 1038    LABSPEC 1.006 10/29/2017 1038   PHURINE 5.0 10/29/2017 1038   GLUCOSEU NEGATIVE 10/29/2017 1038   HGBUR NEGATIVE 10/29/2017 1038   BILIRUBINUR NEGATIVE 10/29/2017 1038   KETONESUR NEGATIVE 10/29/2017 1038   PROTEINUR NEGATIVE 10/29/2017 1038   UROBILINOGEN 0.2 10/21/2013 1655   NITRITE NEGATIVE 10/29/2017 1038   LEUKOCYTESUR SMALL (A) 10/29/2017 1038   Sepsis Labs: @LABRCNTIP (procalcitonin:4,lacticidven:4) )No results found for this or any previous visit (from the past 240 hour(s)).   Radiological Exams on Admission: Dg Chest 2 View  Result Date: 04/14/2018 CLINICAL DATA:  Weakness and syncope. Fell today. History of lung cancer. EXAM: CHEST - 2 VIEW COMPARISON:  Multiple prior chest x-rays from 2019 and 2018. FINDINGS: The cardiac silhouette, mediastinal and hilar contours are within normal limits and stable. Moderate tortuosity and calcification of the thoracic aorta is again demonstrated. Prior bypass surgery. Chronic left apical scarring changes and probable radiation changes. Underlying emphysema. No acute overlying pulmonary findings. Chronic right basilar scarring changes and pleural thickening. The bony thorax is intact. IMPRESSION: Chronic emphysematous changes, pulmonary scarring and left upper lobe scarring and radiation. No acute overlying pulmonary process. Electronically Signed   By: Marijo Sanes M.D.   On: 04/14/2018 08:10   Ct Head Wo Contrast  Result Date: 04/14/2018 CLINICAL DATA:  Tremors and altered mental status EXAM: CT HEAD WITHOUT CONTRAST TECHNIQUE: Contiguous axial images were obtained from the base of the skull through the vertex without intravenous contrast. COMPARISON:  March 27, 2018 FINDINGS: Brain: Mild diffuse atrophy is stable. There is no intracranial mass, hemorrhage, extra-axial fluid collection, or midline shift. There is patchy small vessel disease in the centra semiovale bilaterally. There is an apparent old infarct in the peripheral left  temporal-occipital region, stable. No acute infarct is appreciable. Vascular: There is no evident vascular calcification. There is calcification in each distal vertebral artery and carotid siphon region. Skull: Bony calvarium appears intact. Sinuses/Orbits: There is mucosal thickening in multiple ethmoid air cells bilaterally. Other visualized paranasal sinuses are clear. Orbits appear symmetric bilaterally. Other: Mastoid air cells are clear. IMPRESSION: Atrophy  with supratentorial small vessel disease. Prior left temporal-occipital infarct. No acute infarct. No mass or hemorrhage. There are foci of arterial vascular calcification. There is mucosal thickening in several ethmoid air cells. Electronically Signed   By: Lowella Grip III M.D.   On: 04/14/2018 10:30   Ct Abdomen Pelvis W Contrast  Result Date: 04/14/2018 CLINICAL DATA:  Abdominal pain, acute, generalized. EXAM: CT ABDOMEN AND PELVIS WITH CONTRAST TECHNIQUE: Multidetector CT imaging of the abdomen and pelvis was performed using the standard protocol following bolus administration of intravenous contrast. CONTRAST:  67mL OMNIPAQUE IOHEXOL 300 MG/ML  SOLN COMPARISON:  10/29/2017 FINDINGS: Lower chest: Moderate sliding hiatal hernia. Extensive coronary calcification. Scarring in the right lower lobe with volume loss and mild bronchiectasis. Mild centrilobular emphysema. Hepatobiliary: No focal liver abnormality.No evidence of biliary obstruction or stone. Pancreas: Unremarkable. Spleen: Unremarkable. Adrenals/Urinary Tract: Negative adrenals. Horseshoe kidney. Bilateral hydroureteronephrosis likely from the very distended bladder which is likely patulous at baseline due to the partially folded appearance. Stomach/Bowel: Formed stool throughout much of the colon, most notably the rectum which is distended to 10 cm and fills the pelvis. No evident bowel inflammation. The appendix is difficult to discretely visualize. Vascular/Lymphatic: Extensive  atherosclerotic calcification with high-grade bilateral common femoral and superficial femoral stenosis. No mass or adenopathy. Reproductive:Negative Other: No ascites or pneumoperitoneum. Musculoskeletal: Advanced spinal degeneration. No acute osseous finding. IMPRESSION: 1. Constipation with rectal impaction and distention to 10 cm. 2. Dilated bladder, possibly related to#1, with bilateral hydronephrosis of the horseshoe kidneys. 3. Chronic findings are described above. Electronically Signed   By: Monte Fantasia M.D.   On: 04/14/2018 10:30    EKG: Independently reviewed.  Sinus rhythm, unchanged right bundle branch block    Time spent:60 minutes Code Status:   FULL Family Communication:  Son updated at bedside Disposition Plan: expect 1-2 day hospitalization Consults called: none DVT Prophylaxis: Okay Heparin    Orson Eva, DO  [facility] Pager [phone]  If 7PM-7AM, please contact night-coverage [email or web address] Password [email or web address] 04/14/2018, 12:02 PM

## 2018-04-14 NOTE — Progress Notes (Signed)
EEG completed, results pending. 

## 2018-04-14 NOTE — Consult Note (Signed)
Consultation Note Date: 04/14/2018   Patient Name: [name]  DOB: 1921-09-09  MRN: [id number]  Age / Sex: 85 y.o., male  PCP: Kathyrn Drown, MD Referring Physician: Orson Eva, MD  Reason for Consultation: Establishing goals of care  HPI/Patient Profile: 85 y.o. male  with past medical history of arteriosclerotic cardiovascular disease with a CABG in 1995, coronary artery disease, cerebrovascular disease, COPD, CKD, high blood pressure and cholesterol, history of lung cancer 2013, history of tobacco use 40 pack years stopped in 1980, DJD left knee, gout, benign essential tremor admitted on 04/14/2018 with acute metabolic encephalopathy related to use of opioids, constipation, urinary retention, dehydration.  Palliative medicine consulted for goals of care.   Clinical Assessment and Goals of Care: [name] is lying quietly in bed.  He greets me making and keeping eye contact.  He looks elderly and frail.  Present at bedside is his wife [name], who also looks frail and elderly, and son [name].  [name] tells me that he lives in his parents home, he is their main caregiver.  He tells me that he has 2 sisters who live nearby.  He tells me that he gets a few days off from their care every 3 to 4 months.  [name] tells me that he has seen a decline in his father over the last year, last few months in particular.  He shares that [name] is falling frequently.  [name] states that he believes his father is "20 to 60% and dementia".  [name] tells me that the family would be agreeable to rehab if qualified, and is also considering the possibility of long-term placement.  [name] tells me that it is becoming more more difficult for him to care for both his father and mother.  We talked about his acute health problems including urinary retention/constipation, encephalopathy.  We talked about the treatment plan in detail. EEG tech at room.    Conversation with nursing staff related to urinary retention, catheter placement.  We talked about healthcare power of attorney, see below. We talked about CODE STATUS, see below.  We plan for family meeting at bedside 1/3 at [place] -son [name] shares that eldest sister, [name] is both durable and healthcare power of attorney.   SUMMARY OF RECOMMENDATIONS   Continue full scope treatment at this time. Continue CODE STATUS discussions.  Code Status/Advance Care Planning:  Full code -we talked about CODE STATUS in detail.  [name] tells me that his eldest sister, [name] is healthcare power of attorney.  [name] shares that his father's wish was to have CPR, life support.  I encourage family to review healthcare power of attorney/advanced directive paperwork.  I share that dementia is terminal and incurable, and some people note that if they have a terminal and incurable disease they do not want life support.  Also encourage [name] to discuss with his sister if they should follow the advanced directive or if she is allowed to make  different choices.  Symptom Management:   Per hospitalist, bowel regimen discussed  Palliative Prophylaxis:   Turn Reposition  Additional Recommendations (Limitations, Scope, Preferences):  Full Scope Treatment  Psycho-social/Spiritual:   Desire for further Chaplaincy support:no  Additional Recommendations: Caregiving  Support/Resources and Education on Hospice  Prognosis:   Unable to determine, based on outcomes.  Discharge Planning: To be determined, family would likely accept rehab if qualified.      Primary Diagnoses: Present on Admission: . Acute metabolic encephalopathy . Diastolic CHF, chronic ([place]) . Essential hypertension . Coronary artery disease involving coronary bypass graft of native heart without angina pectoris . CKD (chronic [name] disease), stage III ([place])   I have reviewed the medical record,  interviewed the patient and family, and examined the patient. The following aspects are pertinent.  Past Medical History:  Diagnosis Date  . ASCVD (arteriosclerotic cardiovascular disease)     CABG in 04/1993; and negative stress nuclear study in 08/2001  . Benign essential tremor   . CAD (coronary artery disease)   . Cancer ([place])    skin  . Cerebrovascular disease    Right carotid bruit-40-69% left internal carotid artery stenosis in 4/06; followed [facility]  . Chronic [name] disease, stage II (mild)    Creatinine-1.6 in 09/2008  . COPD (chronic obstructive pulmonary disease) ([place])   . Degenerative joint disease of knee, left   . Gout   . Hiatal hernia   . [place] [name]   . Hyperlipidemia   . Hypertension   . Lung cancer ([place]) 2013  . Normocytic anemia   . Prediabetes   . Pulmonary disease   . Renal insufficiency   . Syncope   . Tobacco abuse, in remission    40 pack years; discontinued in [place] History   Socioeconomic History  . Marital status: Married    Spouse name: Not on file  . Number of children: 3  . Years of education: Not on file  . Highest education level: Not on file  Occupational History  . Occupation: retired    Comment: Clinical biochemist with [employer]  Social Needs  . Financial resource strain: Not hard at all  . Food insecurity:    Worry: Patient refused    Inability: Patient refused  . Transportation needs:    Medical: Patient refused    Non-medical: Patient refused  Tobacco Use  . Smoking status: Former Smoker    Packs/day: 1.00    Years: 40.00    Pack years: 40.00    Types: Cigarettes    Last attempt to quit: 08/14/1973    Years since quitting: 44.6  . Smokeless tobacco: Former Systems developer    Types: [place] date: 12/05/1980  Substance and Sexual Activity  . Alcohol use: No    Alcohol/week: 0.0 standard drinks  . Drug use: No  . Sexual activity: Not Currently  Lifestyle  . Physical activity:    Days per week: Patient refused    Minutes  per session: Patient refused  . Stress: Not on file  Relationships  . Social connections:    Talks on phone: Patient refused    Gets together: Patient refused    Attends religious service: Patient refused    Active member of club or organization: Patient refused    Attends meetings of clubs or organizations: Patient refused    Relationship status: Patient refused  Other Topics Concern  . Not on file  Social History Narrative  . Not on file  Family History  Problem Relation Age of Onset  . Cancer Brother   . Heart attack Brother    Scheduled Meds: . aspirin EC  81 mg Oral Daily  . atorvastatin  40 mg Oral q1800  . carbidopa-levodopa  3 tablet Oral TID  . guaiFENesin  600 mg Oral BID  . heparin  5,000 Units Subcutaneous Q8H  . ipratropium-albuterol  3 mL Nebulization Q6H  . metoprolol tartrate  25 mg Oral BID  . mometasone-formoterol  2 puff Inhalation BID  . multivitamin with minerals  1 tablet Oral Daily  . pantoprazole  40 mg Oral Daily  . polyethylene glycol  17 g Oral Daily  . senna  2 tablet Oral Daily  . tamsulosin  0.4 mg Oral QHS   Continuous Infusions: . sodium chloride 75 mL/hr at 04/14/18 1442   PRN Meds:.acetaminophen **OR** acetaminophen, ondansetron **OR** ondansetron (ZOFRAN) IV Medications Prior to Admission:  Prior to Admission medications   Medication Sig Start Date End Date Taking? Authorizing Provider  albuterol (PROVENTIL) (2.5 MG/3ML) 0.083% nebulizer solution Inhale 3 mLs into the lungs every 4 (four) hours as needed for wheezing or shortness of breath. 09/23/17  Yes Kathie Dike, MD  albuterol (VENTOLIN HFA) 108 (90 Base) MCG/ACT inhaler INHALE 2 PUFFS INTO THE LUNGS EVERY 6 HOURS AS NEEDED FOR SHORTNESS OF BREATH 07/13/17  Yes Kathyrn Drown, MD  Artificial Tear Solution (SOOTHE XP) SOLN Place 1-2 drops into both eyes 2 (two) times daily as needed (for dry eyes).    Yes [provider]  aspirin EC 81 MG tablet Take 81 mg by mouth daily.    Yes [provider]  atorvastatin (LIPITOR) 40 MG tablet TAKE 1 TABLET (40 MG TOTAL) BY MOUTH DAILY. 09/20/17  Yes Luking, Elayne Snare, MD  budesonide-formoterol (SYMBICORT) 160-4.5 MCG/ACT inhaler INHALE 2 PUFF INTO THE LUNGS 2 TIMES DAILY. 08/09/17  Yes Kathyrn Drown, MD  carbidopa-levodopa (SINEMET IR) 25-100 MG tablet Take three tablets by mouth three times daily 03/15/18  Yes Luking, Scott A, MD  docusate sodium (COLACE) 100 MG capsule Take 100 mg by mouth daily as needed for mild constipation.   Yes [provider]  guaiFENesin (MUCINEX) 600 MG 12 hr tablet Take 1 tablet (600 mg total) by mouth 2 (two) times daily. 09/23/17 09/23/18 Yes Kathie Dike, MD  metoprolol tartrate (LOPRESSOR) 25 MG tablet TAKE 1 TABLET BY MOUTH TWICE A DAY 01/05/18  Yes Luking, Elayne Snare, MD  Multiple Vitamins-Minerals (CENTRUM SILVER 50+MEN) TABS Take 1 tablet by mouth daily.   Yes [provider]  mupirocin cream (BACTROBAN) 2 % Apply 1 application topically 2 (two) times daily. 03/27/18  Yes Hernandez, Ana P, PA-C  mupirocin ointment (BACTROBAN) 2 % APPLY TO SKIN 2 TIMES A DAY. 04/12/18  Yes Luking, Elayne Snare, MD  nitroGLYCERIN (NITROSTAT) 0.4 MG SL tablet Place 1 tablet (0.4 mg total) under the tongue every 5 (five) minutes as needed for chest pain. May take up to 3 doses per episode. 04/08/17  Yes Hongalgi, Lenis Dickinson, MD  pantoprazole (PROTONIX) 40 MG tablet TAKE 1 TABLET BY MOUTH DAILY. 01/31/18  Yes Kathyrn Drown, MD  senna (SENOKOT) 8.6 MG tablet Take 2 tablets by mouth daily.   Yes [provider]  tamsulosin (FLOMAX) 0.4 MG CAPS capsule TAKE ONE CAPSULE BY MOUTH AT BEDTIME. 03/01/18  Yes Luking, Elayne Snare, MD  tiotropium (SPIRIVA) 18 MCG inhalation capsule Place 1 capsule (18 mcg total) into inhaler and inhale daily. 09/24/17  Yes Kathie Dike, MD  torsemide (DEMADEX) 20 MG tablet Take 1/2 tablet to one whole tablet prn  for pedal edema 03/23/18  Yes Kathyrn Drown, MD    Allergies  Allergen Reactions  . Propranolol Nausea Only  . Levaquin [Levofloxacin In D5w] Hives and Nausea And Vomiting  . Penicillins Hives and Swelling    Patient has tolerated cephalosporins several times  . Sulfonamide Derivatives Hives and Swelling  . Sympathomimetics Other (See Comments)  . Tape Other (See Comments)    SKIN IS VERY THIN AND TEARS EASILY; PLEASE USE AN ALTERNATIVE TO TAPE!!   Review of Systems  Unable to perform ROS: Age    Physical Exam Vitals signs and nursing note reviewed.  Constitutional:      Comments: Makes and keeps eye contact, appears frail  HENT:     Head:     Comments: Cervical collar in place Neck:     Comments: Cervical collar in place Cardiovascular:     Rate and Rhythm: Normal rate.  Pulmonary:     Effort: Pulmonary effort is normal. No respiratory distress.  Abdominal:     General: Abdomen is flat.     Palpations: Abdomen is soft.  Skin:    General: Skin is warm and dry.  Neurological:     Mental Status: He is alert.     Comments: Not asked orientation questions, calm and cooperative  Psychiatric:        Mood and Affect: Mood normal.        Behavior: Behavior normal.     Vital Signs: BP (!) 146/69 (BP Location: Left Arm)   Pulse 88   Temp 97.7 F (36.5 C) (Oral)   Resp (!) 21   Ht 5\' 7"  (1.702 m)   Wt 55.3 kg   SpO2 95%   BMI 19.11 kg/m  Pain Scale: 0-10   Pain Score: 6    SpO2: SpO2: 95 % O2 Device:SpO2: 95 % O2 Flow Rate: .   IO: Intake/output summary:   Intake/Output Summary (Last 24 hours) at 04/14/2018 1519 Last data filed at 04/14/2018 1300 Gross per 24 hour  Intake 240 ml  Output -  Net 240 ml    LBM:   Baseline Weight: Weight: 57.1 kg Most recent weight: Weight: 55.3 kg     Palliative Assessment/Data:   Flowsheet Rows     Most Recent Value  Intake Tab  Referral Department  Hospitalist  Unit at Time of Referral  Cardiac/Telemetry Unit  Palliative Care Primary Diagnosis  Neurology  Date  Notified  04/14/18  Palliative Care Type  New Palliative care  Date of Admission  04/14/18  Date first seen by Palliative Care  04/14/18  # of days Palliative referral response time  0 Day(s)  # of days IP prior to Palliative referral  0  Clinical Assessment  Palliative Performance Scale Score  30%  Pain Max last 24 hours  Not able to report  Pain Min Last 24 hours  Not able to report  Dyspnea Max Last 24 Hours  Not able to report  Dyspnea Min Last 24 hours  Not able to report  Psychosocial & Spiritual Assessment  Palliative Care Outcomes  Patient/Family meeting held?  Yes  Who was at the meeting?  pt and son at bedside.       Time In: 1325 Time Out: 1435 Time Total: 70 minutes  Greater than 50%  of this time was spent counseling and coordinating care related to the above  assessment and plan.  Signed by: Drue Novel, NP   Please contact Palliative Medicine Team phone at [phone] for questions and concerns.  For individual provider: See Shea Evans

## 2018-04-14 NOTE — ED Notes (Signed)
Pt returned from xray,  

## 2018-04-14 NOTE — ED Notes (Signed)
Pt states that he is still unable to urinate, bladder scan performed with over 970ml in bladder, Dr. Winfred Leeds notified, additional orders given,

## 2018-04-14 NOTE — ED Triage Notes (Signed)
Patient sent by EMS for altered mental status per son. States "this has been going on for years, but worse since the fall on December 15." Patient complaining of pain to neck and states "I'm supposed to go to [facility] today for my neck." Patient alert and oriented at triage.

## 2018-04-14 NOTE — ED Notes (Signed)
Patient had bandages to left hand, left forearm, and right hand. Removed bandages, scabbed over area noted to left hand and right hand and fingers. Dime sized scabbed area noted to left elbow area.

## 2018-04-14 NOTE — ED Notes (Signed)
Attempted foley catheter, unable to succeed due to enlarged prostate.  Notified nurse

## 2018-04-14 NOTE — ED Provider Notes (Signed)
[facility] EMERGENCY DEPARTMENT Provider Note   CSN: [id number] Arrival date & time: 04/14/18  6387     History   Chief Complaint Chief Complaint  Patient presents with  . Altered Mental Status  History is obtained from patient, from paramedics and from patient's son who accompanies him  HPI [name] is a 85 y.o. male.  Patient was seen here on 03/27/2018 for a fall.  Son reports at that time he had a syncopal event.  He suffered an odontoid fracture and abrasions to his arms as a result of the falL that day.  He was sent home in a hard cervical collar which he continues to wear he presents today as he has had 4 or 5 syncopal events since leaving the hospital on 03/27/2018.  Last event was this morning.  He has not fallen to thE ground or injured himself.  His son has been behind him and witnessed each of these falls and has caught him to prevent injury.  He also presents complaining of diffuse abdominal pain since last night.  Son reports he is not had a bowel movement in 5 days.  He has been eating but has been drinking very little.  His son fears that he may be dehydrated.  His son also reports that he is been confused for approximately the past year but more so since leaving the hospital 03/27/2018.  No treatment prior to coming here.  His son is concerned that he is unable to care for him adequately in his present state.  Home health comes into the home twice per week  HPI patient  Past Medical History:  Diagnosis Date  . ASCVD (arteriosclerotic cardiovascular disease)     CABG in 04/1993; and negative stress nuclear study in 08/2001  . Benign essential tremor   . CAD (coronary artery disease)   . Cancer ([place])    skin  . Cerebrovascular disease    Right carotid bruit-40-69% left internal carotid artery stenosis in 4/06; followed [facility]  . Chronic kidney disease, stage II (mild)    Creatinine-1.6 in 09/2008  . COPD (chronic obstructive pulmonary disease) ([place])   . Degenerative  joint disease of knee, left   . Gout   . Hiatal hernia   . Horseshoe kidney   . Hyperlipidemia   . Hypertension   . Lung cancer ([place]) 2013  . Normocytic anemia   . Prediabetes   . Pulmonary disease   . Renal insufficiency   . Syncope   . Tobacco abuse, in remission    40 pack years; discontinued in 1980    Patient Active Problem List   Diagnosis Date Noted  . Gout 01/14/2018  . Lobar pneumonia, unspecified organism ([place]) 10/29/2017  . Pleural effusion, right   . Pedal edema 05/14/2017  . Parkinson's disease ([place]) 10/05/2016  . Diastolic CHF, chronic ([place]) 05/10/2015  . Dizziness   . Coronary artery disease involving coronary bypass graft of native heart without angina pectoris   . Near syncope 04/02/2015  . Diarrhea 04/02/2015  . COPD exacerbation ([place]) 06/04/2013  . Shortness of breath 06/04/2013  . Acute respiratory failure with hypoxia ([place]) 06/04/2013  . Hemoptysis 08/10/2012  . Prediabetes 08/28/2011  . Lung cancer ([place]) 08/25/2011  . COPD (chronic obstructive pulmonary disease) ([place]) 08/25/2011  . CKD (chronic kidney disease), stage III ([place]) 12/07/2010  . Chest pain 08/18/2010  . Abdominal pain 08/18/2010  . Syncope   . Tobacco abuse, in remission   . Degenerative joint  disease of knee, left   . COLONIC POLYPS 05/20/2009  . Hyperlipidemia 05/20/2009  . ANEMIA 05/20/2009  . Essential tremor 05/20/2009  . ATHEROSCLEROTIC CARDIOVASCULAR DISEASE 05/20/2009  . CEREBROVASCULAR DISEASE 05/20/2009  . Essential hypertension 01/14/2009    Past Surgical History:  Procedure Laterality Date  . CARDIAC SURGERY    . COLONOSCOPY W/ POLYPECTOMY  1985  . CORONARY ARTERY BYPASS GRAFT  1995  . LESION EXCISION    . PILONIDAL CYST EXCISION  1948        Home Medications    Prior to Admission medications   Medication Sig Start Date End Date Taking? Authorizing Provider  albuterol (PROVENTIL) (2.5 MG/3ML) 0.083% nebulizer solution Inhale 3 mLs into the lungs every 4  (four) hours as needed for wheezing or shortness of breath. 09/23/17   Kathie Dike, MD  albuterol (VENTOLIN HFA) 108 (90 Base) MCG/ACT inhaler INHALE 2 PUFFS INTO THE LUNGS EVERY 6 HOURS AS NEEDED FOR SHORTNESS OF BREATH 07/13/17   Kathyrn Drown, MD  Artificial Tear Solution (SOOTHE XP) SOLN Place 1-2 drops into both eyes 2 (two) times daily as needed (for dry eyes).     [provider]  aspirin EC 81 MG tablet Take 81 mg by mouth daily.    [provider]  atorvastatin (LIPITOR) 40 MG tablet TAKE 1 TABLET (40 MG TOTAL) BY MOUTH DAILY. 09/20/17   Kathyrn Drown, MD  budesonide-formoterol (SYMBICORT) 160-4.5 MCG/ACT inhaler INHALE 2 PUFF INTO THE LUNGS 2 TIMES DAILY. 08/09/17   Kathyrn Drown, MD  carbidopa-levodopa (SINEMET IR) 25-100 MG tablet Take three tablets by mouth three times daily 03/15/18   Kathyrn Drown, MD  docusate sodium (COLACE) 100 MG capsule Take 100 mg by mouth daily as needed for mild constipation.    [provider]  guaiFENesin (MUCINEX) 600 MG 12 hr tablet Take 1 tablet (600 mg total) by mouth 2 (two) times daily. 09/23/17 09/23/18  Kathie Dike, MD  metoprolol tartrate (LOPRESSOR) 25 MG tablet TAKE 1 TABLET BY MOUTH TWICE A DAY 01/05/18   Kathyrn Drown, MD  Multiple Vitamins-Minerals (CENTRUM SILVER 50+MEN) TABS Take 1 tablet by mouth daily.    [provider]  mupirocin cream (BACTROBAN) 2 % Apply 1 application topically 2 (two) times daily. 03/27/18   Darlin Drop P, PA-C  mupirocin ointment (BACTROBAN) 2 % APPLY TO SKIN 2 TIMES A DAY. 04/12/18   Kathyrn Drown, MD  nitroGLYCERIN (NITROSTAT) 0.4 MG SL tablet Place 1 tablet (0.4 mg total) under the tongue every 5 (five) minutes as needed for chest pain. May take up to 3 doses per episode. 04/08/17   Hongalgi, Lenis Dickinson, MD  pantoprazole (PROTONIX) 40 MG tablet TAKE 1 TABLET BY MOUTH DAILY. 01/31/18   Kathyrn Drown, MD  tamsulosin (FLOMAX) 0.4 MG CAPS capsule TAKE ONE CAPSULE BY MOUTH  AT BEDTIME. 03/01/18   Kathyrn Drown, MD  tiotropium (SPIRIVA) 18 MCG inhalation capsule Place 1 capsule (18 mcg total) into inhaler and inhale daily. 09/24/17   Kathie Dike, MD  torsemide (DEMADEX) 20 MG tablet Take 1/2 tablet to one whole tablet prn  for pedal edema 03/23/18   Kathyrn Drown, MD    Family History Family History  Problem Relation Age of Onset  . Cancer Brother   . Heart attack Brother     Social History Social History   Tobacco Use  . Smoking status: Former Smoker    Packs/day: 1.00    Years: 40.00  Pack years: 40.00    Types: Cigarettes    Last attempt to quit: 08/14/1973    Years since quitting: 44.6  . Smokeless tobacco: Former Systems developer    Types: [place] date: 12/05/1980  Substance Use Topics  . Alcohol use: No    Alcohol/week: 0.0 standard drinks  . Drug use: No     Allergies   Propranolol; Levaquin [levofloxacin in d5w]; Penicillins; Sulfonamide derivatives; Sympathomimetics; and Tape   Review of Systems Review of Systems  Cardiovascular:       Syncope  Gastrointestinal: Positive for abdominal pain and constipation.  Musculoskeletal: Positive for gait problem.       Walks with walker due to odontoid fracture  All other systems reviewed and are negative.    Physical Exam Updated Vital Signs BP (!) 141/60 (BP Location: Left Arm)   Pulse 69   Temp 97.6 F (36.4 C) (Oral)   Resp 20   Ht 5\' 7"  (1.702 m)   Wt 57.1 kg   SpO2 100%   BMI 19.72 kg/m   Physical Exam Vitals signs and nursing note reviewed.  Constitutional:      Appearance: He is well-developed.  HENT:     Head: Normocephalic and atraumatic.  Eyes:     Conjunctiva/sclera: Conjunctivae normal.     Pupils: Pupils are equal, round, and reactive to light.  Neck:     Thyroid: No thyromegaly.     Trachea: No tracheal deviation.     Comments: IN Hard cervical collar.  Trachea midline Cardiovascular:     Rate and Rhythm: Normal rate and regular rhythm.     Heart  sounds: No murmur.  Pulmonary:     Effort: Pulmonary effort is normal.     Breath sounds: Normal breath sounds.  Abdominal:     General: Bowel sounds are normal. There is no distension.     Palpations: Abdomen is soft.     Tenderness: There is abdominal tenderness.     Comments: Tender over lower quadrants bilaterally  Genitourinary:    Penis: Normal.      Scrotum/Testes: Normal.     Rectum: Guaiac result positive.     Comments: Rectum normal tone soft brown stool trace Hemoccult positive Musculoskeletal: Normal range of motion.        General: No tenderness.     Comments: Thoracic and lumbar spine nontender.  Pelvis stable nontender.  All 4 extremities without deformity or swelling, neurovascular intact  Skin:    General: Skin is warm and dry.     Findings: No rash.  Neurological:     General: No focal deficit present.     Mental Status: He is alert and oriented to person, place, and time.     Coordination: Coordination normal.     Comments: Motor strength 5/5 overall  Psychiatric:        Mood and Affect: Mood normal.        Behavior: Behavior normal.      ED Treatments / Results  Labs (all labs ordered are listed, but only abnormal results are displayed) Labs Reviewed  POC OCCULT BLOOD, ED - Abnormal; Notable for the following components:      Result Value   Fecal Occult Bld POSITIVE (*)    All other components within normal limits  CBC WITH DIFFERENTIAL/PLATELET  COMPREHENSIVE METABOLIC PANEL  URINALYSIS, ROUTINE W REFLEX MICROSCOPIC  TROPONIN I  POC OCCULT BLOOD, ED    EKG None  Radiology No results found.  Procedures Procedures (including critical care time)  Medications Ordered in ED Medications  sodium chloride 0.9 % bolus 1,000 mL (has no administration in time range)  thiamine (B-1) injection 100 mg (has no administration in time range)     Initial Impression / Assessment and Plan / ED Course  I have reviewed the triage vital signs and the  nursing notes.  Pertinent labs & imaging results that were available during my care of the patient were reviewed by me and considered in my medical decision making (see chart for details).     Patient is fully oriented presently and does not appear to be confused presently per his son Results for orders placed or performed during the hospital encounter of 04/14/18  CBC with Differential/Platelet  Result Value Ref Range   WBC 9.1 4.0 - 10.5 K/uL   RBC 3.07 (L) 4.22 - 5.81 MIL/uL   Hemoglobin 9.4 (L) 13.0 - 17.0 g/dL   HCT 30.1 (L) 39.0 - 52.0 %   MCV 98.0 80.0 - 100.0 fL   MCH 30.6 26.0 - 34.0 pg   MCHC 31.2 30.0 - 36.0 g/dL   RDW 15.9 (H) 11.5 - 15.5 %   Platelets 290 150 - 400 K/uL   nRBC 0.0 0.0 - 0.2 %   Neutrophils Relative % 78 %   Neutro Abs 7.0 1.7 - 7.7 K/uL   Lymphocytes Relative 13 %   Lymphs Abs 1.2 0.7 - 4.0 K/uL   Monocytes Relative 9 %   Monocytes Absolute 0.8 0.1 - 1.0 K/uL   Eosinophils Relative 0 %   Eosinophils Absolute 0.0 0.0 - 0.5 K/uL   Basophils Relative 0 %   Basophils Absolute 0.0 0.0 - 0.1 K/uL   Immature Granulocytes 0 %   Abs Immature Granulocytes 0.02 0.00 - 0.07 K/uL  Comprehensive metabolic panel  Result Value Ref Range   Sodium 136 135 - 145 mmol/L   Potassium 4.1 3.5 - 5.1 mmol/L   Chloride 106 98 - 111 mmol/L   CO2 23 22 - 32 mmol/L   Glucose, Bld 108 (H) 70 - 99 mg/dL   BUN 43 (H) 8 - 23 mg/dL   Creatinine, Ser 1.85 (H) 0.61 - 1.24 mg/dL   Calcium 8.8 (L) 8.9 - 10.3 mg/dL   Total Protein 6.2 (L) 6.5 - 8.1 g/dL   Albumin 3.3 (L) 3.5 - 5.0 g/dL   AST 20 15 - 41 U/L   ALT 5 0 - 44 U/L   Alkaline Phosphatase 81 38 - 126 U/L   Total Bilirubin 0.5 0.3 - 1.2 mg/dL   GFR calc non Af Amer 30 (L) >60 mL/min   GFR calc Af Amer 35 (L) >60 mL/min   Anion gap 7 5 - 15  Troponin I - ONCE - STAT  Result Value Ref Range   Troponin I <0.03 <0.03 ng/mL  POC occult blood, ED  Result Value Ref Range   Fecal Occult Bld POSITIVE (A) NEGATIVE   Dg  Chest 2 View  Result Date: 04/14/2018 CLINICAL DATA:  Weakness and syncope. Fell today. History of lung cancer. EXAM: CHEST - 2 VIEW COMPARISON:  Multiple prior chest x-rays from 2019 and 2018. FINDINGS: The cardiac silhouette, mediastinal and hilar contours are within normal limits and stable. Moderate tortuosity and calcification of the thoracic aorta is again demonstrated. Prior bypass surgery. Chronic left apical scarring changes and probable radiation changes. Underlying emphysema. No acute overlying pulmonary findings. Chronic right basilar scarring changes and pleural thickening. The  bony thorax is intact. IMPRESSION: Chronic emphysematous changes, pulmonary scarring and left upper lobe scarring and radiation. No acute overlying pulmonary process. Electronically Signed   By: Marijo Sanes M.D.   On: 04/14/2018 08:10   Dg Cervical Spine Complete  Result Date: 03/27/2018 CLINICAL DATA:  Blacked out.  Fall. EXAM: CERVICAL SPINE - COMPLETE 4+ VIEW COMPARISON:  CT scan March 10, 2011 FINDINGS: Dense calcifications in the neck or consistent with carotid calcifications. Minimal anterolisthesis of C2 versus C3 is unchanged and likely degenerative. Minimal anterolisthesis of C7 versus T1 is stable as well. No other malalignment identified. No fractures are seen. Multilevel degenerative disc disease. The neural foramina are patent. Chronic pleuroparenchymal thickening in the left apex is stable since at least December 2018. The superior most sternal wire is fractured, also stable. A dedicated odontoid view is limited. IMPRESSION: 1. This study is significantly limited due to positioning and osteopenia. No definitive fracture or traumatic malalignment. However, if there is clinical concern for cervical spine fracture, a CT scan is recommended. 2. Dense carotid calcifications. 3. Chronic changes in the left apex of the lung. Electronically Signed   By: Dorise Bullion III M.D   On: 03/27/2018 09:16   Ct Head  Wo Contrast  Result Date: 04/14/2018 CLINICAL DATA:  Tremors and altered mental status EXAM: CT HEAD WITHOUT CONTRAST TECHNIQUE: Contiguous axial images were obtained from the base of the skull through the vertex without intravenous contrast. COMPARISON:  March 27, 2018 FINDINGS: Brain: Mild diffuse atrophy is stable. There is no intracranial mass, hemorrhage, extra-axial fluid collection, or midline shift. There is patchy small vessel disease in the centra semiovale bilaterally. There is an apparent old infarct in the peripheral left temporal-occipital region, stable. No acute infarct is appreciable. Vascular: There is no evident vascular calcification. There is calcification in each distal vertebral artery and carotid siphon region. Skull: Bony calvarium appears intact. Sinuses/Orbits: There is mucosal thickening in multiple ethmoid air cells bilaterally. Other visualized paranasal sinuses are clear. Orbits appear symmetric bilaterally. Other: Mastoid air cells are clear. IMPRESSION: Atrophy with supratentorial small vessel disease. Prior left temporal-occipital infarct. No acute infarct. No mass or hemorrhage. There are foci of arterial vascular calcification. There is mucosal thickening in several ethmoid air cells. Electronically Signed   By: Lowella Grip III M.D.   On: 04/14/2018 10:30   Ct Head Wo Contrast  Result Date: 03/27/2018 CLINICAL DATA:  Fall after loss of consciousness.  Facial trauma. EXAM: CT HEAD WITHOUT CONTRAST CT MAXILLOFACIAL WITHOUT CONTRAST TECHNIQUE: Multidetector CT imaging of the head and maxillofacial structures were performed using the standard protocol without intravenous contrast. Multiplanar CT image reconstructions of the maxillofacial structures were also generated. COMPARISON:  CT scan of July 28, 2016. FINDINGS: CT HEAD FINDINGS Brain: Mild diffuse cortical atrophy is noted. Mild chronic ischemic white matter disease is noted. No mass effect or midline shift is  noted. Ventricular size is within normal limits. There is no evidence of mass lesion, hemorrhage or acute infarction. Vascular: No hyperdense vessel or unexpected calcification. Skull: Normal. Negative for fracture or focal lesion. Other: None. CT MAXILLOFACIAL FINDINGS Osseous: Mildly displaced type 2 odontoid fracture of C2 is noted. The mandible is unremarkable. No other bony abnormality is noted in maxillofacial region. Orbits: Negative. No traumatic or inflammatory finding. Sinuses: Clear. Soft tissues: Negative. IMPRESSION: Mild diffuse cortical atrophy. Mild chronic ischemic white matter disease. No acute intracranial abnormality seen. Mildly displaced type 2 odontoid fracture of C2 is noted. Critical Value/emergent results  were called by telephone at the time of interpretation on 03/27/2018 at 9:29 am to Dr. Kris Mouton, who verbally acknowledged these results. No definite abnormality seen in maxillofacial region. Electronically Signed   By: Marijo Conception, M.D.   On: 03/27/2018 09:30   Ct Cervical Spine Wo Contrast  Result Date: 03/27/2018 CLINICAL DATA:  Was walking today and suffered a loss of consciousness, fall, multiple lacerations to face; history lung cancer, hypertension, coronary artery disease, former smoker; abnormal CT facial bones EXAM: CT CERVICAL SPINE WITHOUT CONTRAST TECHNIQUE: Multidetector CT imaging of the cervical spine was performed without intravenous contrast. Multiplanar CT image reconstructions were also generated. COMPARISON:  03/10/2011 CT cervical spine, chest radiograph 11/22/2017 FINDINGS: Alignment: Mild anterolisthesis at C7-T1, unchanged. Remaining alignments normal Skull base and vertebrae: Diffuse osseous demineralization. Visualized skull base intact. Nondisplaced type II odontoid fracture, minimally oblique anterior to posterior. Prominent calcified pannus posterior to the odontoid process. Vertebral body heights maintained. No additional fracture, subluxation or  bone destruction. Multilevel disc space narrowing and endplate spur formation. Multilevel facet degenerative changes bilaterally. Soft tissues and spinal canal: Prevertebral soft tissues normal thickness. Dense atherosclerotic calcifications aorta. Disc levels:  No additional abnormalities Upper chest: RIGHT lung apex clear. Opacified LEFT lung apex, corresponding to pleural fluid/thickening on prior chest radiographs, question sequela of treatment of lung cancer. Other: N/A IMPRESSION: Nondisplaced minimally oblique type II odontoid fracture. Osseous demineralization with multilevel degenerative disc and facet disease changes of the cervical spine. Pleural scarring and effusion at LEFT apex unchanged since prior chest radiograph, question sequela of treatment for lung cancer. Electronically Signed   By: Lavonia Dana M.D.   On: 03/27/2018 10:27   Ct Abdomen Pelvis W Contrast  Result Date: 04/14/2018 CLINICAL DATA:  Abdominal pain, acute, generalized. EXAM: CT ABDOMEN AND PELVIS WITH CONTRAST TECHNIQUE: Multidetector CT imaging of the abdomen and pelvis was performed using the standard protocol following bolus administration of intravenous contrast. CONTRAST:  30mL OMNIPAQUE IOHEXOL 300 MG/ML  SOLN COMPARISON:  10/29/2017 FINDINGS: Lower chest: Moderate sliding hiatal hernia. Extensive coronary calcification. Scarring in the right lower lobe with volume loss and mild bronchiectasis. Mild centrilobular emphysema. Hepatobiliary: No focal liver abnormality.No evidence of biliary obstruction or stone. Pancreas: Unremarkable. Spleen: Unremarkable. Adrenals/Urinary Tract: Negative adrenals. Horseshoe kidney. Bilateral hydroureteronephrosis likely from the very distended bladder which is likely patulous at baseline due to the partially folded appearance. Stomach/Bowel: Formed stool throughout much of the colon, most notably the rectum which is distended to 10 cm and fills the pelvis. No evident bowel inflammation. The  appendix is difficult to discretely visualize. Vascular/Lymphatic: Extensive atherosclerotic calcification with high-grade bilateral common femoral and superficial femoral stenosis. No mass or adenopathy. Reproductive:Negative Other: No ascites or pneumoperitoneum. Musculoskeletal: Advanced spinal degeneration. No acute osseous finding. IMPRESSION: 1. Constipation with rectal impaction and distention to 10 cm. 2. Dilated bladder, possibly related to#1, with bilateral hydronephrosis of the horseshoe kidneys. 3. Chronic findings are described above. Electronically Signed   By: Monte Fantasia M.D.   On: 04/14/2018 10:30   Ct Maxillofacial Wo Cm  Result Date: 03/27/2018 CLINICAL DATA:  Fall after loss of consciousness.  Facial trauma. EXAM: CT HEAD WITHOUT CONTRAST CT MAXILLOFACIAL WITHOUT CONTRAST TECHNIQUE: Multidetector CT imaging of the head and maxillofacial structures were performed using the standard protocol without intravenous contrast. Multiplanar CT image reconstructions of the maxillofacial structures were also generated. COMPARISON:  CT scan of July 28, 2016. FINDINGS: CT HEAD FINDINGS Brain: Mild diffuse cortical atrophy is noted. Mild chronic  ischemic white matter disease is noted. No mass effect or midline shift is noted. Ventricular size is within normal limits. There is no evidence of mass lesion, hemorrhage or acute infarction. Vascular: No hyperdense vessel or unexpected calcification. Skull: Normal. Negative for fracture or focal lesion. Other: None. CT MAXILLOFACIAL FINDINGS Osseous: Mildly displaced type 2 odontoid fracture of C2 is noted. The mandible is unremarkable. No other bony abnormality is noted in maxillofacial region. Orbits: Negative. No traumatic or inflammatory finding. Sinuses: Clear. Soft tissues: Negative. IMPRESSION: Mild diffuse cortical atrophy. Mild chronic ischemic white matter disease. No acute intracranial abnormality seen. Mildly displaced type 2 odontoid fracture of  C2 is noted. Critical Value/emergent results were called by telephone at the time of interpretation on 03/27/2018 at 9:29 am to Dr. Kris Mouton, who verbally acknowledged these results. No definite abnormality seen in maxillofacial region. Electronically Signed   By: Marijo Conception, M.D.   On: 03/27/2018 09:30  Lab work consistent with anemia and renal insufficiency which is chronic. Foley catheter inserted as patient found to be in urinary retention.  On hopeful that when bladder decompresses fecal impaction will resolve itself.  He has soft stool in rectal vault.  I consulted [place] hospitalist service who will arrange for overnight stay.  Chest x-ray viewed by me   Patient should remain in hard cervical collar Final Clinical Impressions(s) / ED Diagnoses  Diagnosis #1 recurrent syncope Final diagnoses:  None  #2 chronic renal insufficiency #3 anemia #4 urinary retention #5 fecal impaction ED Discharge Orders    None    #6 Hemoccult positive stools   Orlie Dakin, MD 04/14/18 1131

## 2018-04-14 NOTE — Evaluation (Signed)
Physical Therapy Evaluation Patient Details Name: [name] MRN: [id number] DOB: 1921/08/03 Today's Date: 04/14/2018   History of Present Illness  [name] is a 85 y.o. male with medical history of coronary artery disease, COPD, Parkinson's disease, chronic diastolic CHF, horseshoe kidney, hypertension, hyperlipidemia, squamous cell lung cancer, and cognitive impairment presenting with altered mental status.  The patient has been having mechanical falls for the last several months.  The patient had a fall approximately 2 weeks prior to this admission.  The patient's son brought the patient to the emergency department on 03/27/2018.  Work-up at that time revealed that the patient had an odontoid fracture.  Neurosurgery was consulted at that time and felt the patient would benefit from nonoperative management.  The patient was placed in an Aspen collar and discharged home.  Since that time, the patient has rarely used hydrocodone for pain.  In fact, the patient is only taking 2 x 1/2 tablets of hydrocodone.  There have been no other new medications.  Since his fall 2 weeks prior to this admission, the patient's son states that the patient has had a cognitive decline and functional decline worse than his baseline.  He has been having more confusion and wandering.  However further history from the son reveals that the patient has had a gradual cognitive and functional decline over the past year.  The patient's son states that he has been having syncopal episodes, at least 5 since his ED visit.  However further history suggest that these may be mechanical falls or may be due to gait instability.  The patient's son states that the patient is usually falling when repositioning his walker, and the patient falls back onto the sons hands during which time the son feels like the patient may have been "out" for only a few seconds.  However, the patient remains conversant and answers appropriately.  There is no bowel  bladder incontinence or tongue biting.    Clinical Impression  Patient demonstrates labored movement for sitting up at bedside with shaky movement of extremities, c/o dizziness up on sitting that slightly decreased after a 3-4 minutes, able to ambulate in hallway without loss of balance, but limited secondary to c/o fatigue and requested to go back to bed after therapy.  Patient will benefit from continued physical therapy in hospital and recommended venue below to increase strength, balance, endurance for safe ADLs and gait.    Follow Up Recommendations SNF    Equipment Recommendations  None recommended by PT    Recommendations for Other Services       Precautions / Restrictions Precautions Precautions: Fall Restrictions Weight Bearing Restrictions: No      Mobility  Bed Mobility Overal bed mobility: Needs Assistance Bed Mobility: Supine to Sit;Sit to Supine     Supine to sit: Min assist Sit to supine: Min assist   General bed mobility comments: slow labored movement, very shaky  Transfers Overall transfer level: Needs assistance Equipment used: Rolling walker (2 wheeled) Transfers: Sit to/from [facility] Sit to Stand: Min assist Stand pivot transfers: Min assist       General transfer comment: unsteady and shaky on feet  Ambulation/Gait Ambulation/Gait assistance: Min assist Gait Distance (Feet): 100 Feet Assistive device: Rolling walker (2 wheeled) Gait Pattern/deviations: Decreased step length - right;Decreased step length - left;Decreased stride length Gait velocity: slightly decreased   General Gait Details: slightly unsteady cadence without loss of balance, limited secondary to fatigue  Stairs  Wheelchair Mobility    Modified Rankin (Stroke Patients Only)       Balance Overall balance assessment: Needs assistance Sitting-balance support: Feet supported;No upper extremity supported Sitting balance-Leahy Scale:  Fair     Standing balance support: Bilateral upper extremity supported;During functional activity Standing balance-Leahy Scale: Fair Standing balance comment: using RW                             Pertinent Vitals/Pain Pain Assessment: 0-10 Pain Score: 8  Pain Location: tail bone Pain Descriptors / Indicators: Aching;Sore Pain Intervention(s): Limited activity within patient's tolerance;Monitored during session    [place] expects to be discharged to:: Private residence Living Arrangements: Spouse/significant other Available Help at Discharge: Family;Available 24 hours/day Type of Home: House Home Access: Stairs to enter Entrance Stairs-Rails: Left Entrance Stairs-Number of Steps: 3 Home Layout: One level Home Equipment: Walker - 4 wheels;Cane - single point;Shower seat - built in      Prior Function Level of Independence: Needs assistance   Gait / Transfers Assistance Needed: household ambulator with RW since neck fracture  ADL's / Homemaking Assistance Needed: assisted by family        Hand Dominance   Dominant Hand: Right    Extremity/Trunk Assessment   Upper Extremity Assessment Upper Extremity Assessment: Generalized weakness    Lower Extremity Assessment Lower Extremity Assessment: Generalized weakness    Cervical / Trunk Assessment Cervical / Trunk Assessment: Normal  Communication   Communication: No difficulties  Cognition Arousal/Alertness: Awake/alert Behavior During Therapy: WFL for tasks assessed/performed Overall Cognitive Status: Within Functional Limits for tasks assessed                                        General Comments      Exercises     Assessment/Plan    PT Assessment Patient needs continued PT services  PT Problem List Decreased strength;Decreased activity tolerance;Decreased balance;Decreased mobility       PT Treatment Interventions Gait training;Stair training;Functional  mobility training;Therapeutic activities;Patient/family education;Therapeutic exercise    PT Goals (Current goals can be found in the Care Plan section)  Acute Rehab PT Goals Patient Stated Goal: return home after rehab PT Goal Formulation: With patient/family Time For Goal Achievement: 04/28/18 Potential to Achieve Goals: Good    Frequency Min 3X/week   Barriers to discharge        Co-evaluation               AM-PAC PT "6 Clicks" Mobility  Outcome Measure Help needed turning from your back to your side while in a flat bed without using bedrails?: None Help needed moving from lying on your back to sitting on the side of a flat bed without using bedrails?: A Lot Help needed moving to and from a bed to a chair (including a wheelchair)?: A Lot Help needed standing up from a chair using your arms (e.g., wheelchair or bedside chair)?: A Little Help needed to walk in hospital room?: A Little Help needed climbing 3-5 steps with a railing? : A Little 6 Click Score: 17    End of Session Equipment Utilized During Treatment: Gait belt;Cervical collar(hard cervical collar) Activity Tolerance: Patient tolerated treatment well;Patient limited by fatigue Patient left: in bed;with call bell/phone within reach;with bed alarm set;with family/visitor present Nurse Communication: Mobility status PT Visit Diagnosis: Unsteadiness on feet (  R26.81);Other abnormalities of gait and mobility (R26.89);Muscle weakness (generalized) (M62.81)    Time: 1173-5670 PT Time Calculation (min) (ACUTE ONLY): 25 min   Charges:   PT Evaluation $PT Eval Moderate Complexity: 1 Mod PT Treatments $Therapeutic Activity: 23-37 mins        4:16 PM, 04/14/18 Lonell Grandchild, MPT Physical Therapist with [facility] 336 [phone] office [phone] mobile phone

## 2018-04-14 NOTE — Plan of Care (Signed)
  Problem: Acute Rehab PT Goals(only PT should resolve) Goal: Pt Will Go Supine/Side To Sit Outcome: Progressing Flowsheets (Taken 04/14/2018 1617) Pt will go Supine/Side to Sit: with min guard assist Goal: Patient Will Transfer Sit To/From Stand Outcome: Progressing Flowsheets (Taken 04/14/2018 1617) Patient will transfer sit to/from stand: with min guard assist Goal: Pt Will Transfer Bed To Chair/Chair To Bed Outcome: Progressing Flowsheets (Taken 04/14/2018 1617) Pt will Transfer Bed to Chair/Chair to Bed: min guard assist Goal: Pt Will Ambulate Outcome: Progressing Flowsheets (Taken 04/14/2018 1617) Pt will Ambulate: > 125 feet; with min guard assist   4:18 PM, 04/14/18 Lonell Grandchild, MPT Physical Therapist with [facility] 336 [phone] office [phone] mobile phone

## 2018-04-14 NOTE — Progress Notes (Addendum)
Admitted to floor earlier and was alert and oriented x 4 and daughter Asencion Partridge said that he seemed like he was no longer confused.  Assessed buttocks and sacrum and no skin breakdown but areas dark and foam dressings placed.  Skin tears to hands are old and dry and were wrapped in gauze over telfa.  #14 coude cath placed with no difficulty.  Son was present and said that patient had been having syncopal episodes at home with him while walking  Daughter at bedside now.

## 2018-04-15 ENCOUNTER — Observation Stay (HOSPITAL_COMMUNITY): Payer: Medicare Other

## 2018-04-15 ENCOUNTER — Telehealth: Payer: Self-pay | Admitting: Family Medicine

## 2018-04-15 ENCOUNTER — Observation Stay (HOSPITAL_BASED_OUTPATIENT_CLINIC_OR_DEPARTMENT_OTHER): Payer: Medicare Other

## 2018-04-15 DIAGNOSIS — N1330 Unspecified hydronephrosis: Secondary | ICD-10-CM

## 2018-04-15 DIAGNOSIS — K5641 Fecal impaction: Secondary | ICD-10-CM | POA: Diagnosis not present

## 2018-04-15 DIAGNOSIS — N1339 Other hydronephrosis: Secondary | ICD-10-CM

## 2018-04-15 DIAGNOSIS — G9341 Metabolic encephalopathy: Secondary | ICD-10-CM | POA: Diagnosis not present

## 2018-04-15 DIAGNOSIS — Z7189 Other specified counseling: Secondary | ICD-10-CM | POA: Diagnosis not present

## 2018-04-15 DIAGNOSIS — I361 Nonrheumatic tricuspid (valve) insufficiency: Secondary | ICD-10-CM

## 2018-04-15 DIAGNOSIS — I340 Nonrheumatic mitral (valve) insufficiency: Secondary | ICD-10-CM | POA: Diagnosis not present

## 2018-04-15 DIAGNOSIS — R55 Syncope and collapse: Secondary | ICD-10-CM

## 2018-04-15 DIAGNOSIS — N183 Chronic kidney disease, stage 3 (moderate): Secondary | ICD-10-CM | POA: Diagnosis not present

## 2018-04-15 DIAGNOSIS — R338 Other retention of urine: Secondary | ICD-10-CM | POA: Diagnosis not present

## 2018-04-15 DIAGNOSIS — R627 Adult failure to thrive: Secondary | ICD-10-CM

## 2018-04-15 LAB — ECHOCARDIOGRAM COMPLETE
Height: 67 in
Weight: 1952 oz

## 2018-04-15 LAB — BASIC METABOLIC PANEL
Anion gap: 6 (ref 5–15)
BUN: 41 mg/dL — AB (ref 8–23)
CO2: 23 mmol/L (ref 22–32)
Calcium: 8.6 mg/dL — ABNORMAL LOW (ref 8.9–10.3)
Chloride: 111 mmol/L (ref 98–111)
Creatinine, Ser: 1.92 mg/dL — ABNORMAL HIGH (ref 0.61–1.24)
GFR calc Af Amer: 34 mL/min — ABNORMAL LOW (ref >60)
GFR calc non Af Amer: 29 mL/min — ABNORMAL LOW (ref >60)
Glucose, Bld: 98 mg/dL (ref 70–99)
Potassium: 4 mmol/L (ref 3.5–5.1)
Sodium: 140 mmol/L (ref 135–145)

## 2018-04-15 LAB — URINE CULTURE: Culture: NO GROWTH

## 2018-04-15 LAB — CBC
HCT: 27.2 % — ABNORMAL LOW (ref 39.0–52.0)
Hemoglobin: 8.4 g/dL — ABNORMAL LOW (ref 13.0–17.0)
MCH: 29.9 pg (ref 26.0–34.0)
MCHC: 30.9 g/dL (ref 30.0–36.0)
MCV: 96.8 fL (ref 80.0–100.0)
Platelets: 252 10*3/uL (ref 150–400)
RBC: 2.81 MIL/uL — ABNORMAL LOW (ref 4.22–5.81)
RDW: 16 % — ABNORMAL HIGH (ref 11.5–15.5)
WBC: 10.1 10*3/uL (ref 4.0–10.5)
nRBC: 0 % (ref 0.0–0.2)

## 2018-04-15 LAB — T4, FREE: Free T4: 1.21 ng/dL (ref 0.82–1.77)

## 2018-04-15 MED ORDER — IPRATROPIUM-ALBUTEROL 0.5-2.5 (3) MG/3ML IN SOLN
3.0000 mL | Freq: Two times a day (BID) | RESPIRATORY_TRACT | Status: DC
  Administered 2018-04-15 – 2018-04-19 (×9): 3 mL via RESPIRATORY_TRACT
  Filled 2018-04-15 (×8): qty 3

## 2018-04-15 MED ORDER — SODIUM CHLORIDE 0.9 % IV SOLN
INTRAVENOUS | Status: DC
  Administered 2018-04-15 – 2018-04-19 (×6): via INTRAVENOUS

## 2018-04-15 MED ORDER — POLYETHYLENE GLYCOL 3350 17 G PO PACK
17.0000 g | PACK | Freq: Two times a day (BID) | ORAL | Status: DC
  Administered 2018-04-15 – 2018-04-19 (×6): 17 g via ORAL
  Filled 2018-04-15 (×8): qty 1

## 2018-04-15 MED ORDER — BISACODYL 10 MG RE SUPP
10.0000 mg | Freq: Once | RECTAL | Status: AC
  Administered 2018-04-15: 10 mg via RECTAL
  Filled 2018-04-15: qty 1

## 2018-04-15 MED ORDER — IPRATROPIUM-ALBUTEROL 0.5-2.5 (3) MG/3ML IN SOLN: 3.0000 mL | RESPIRATORY_TRACT | Status: DC | PRN

## 2018-04-15 NOTE — NC FL2 (Signed)
[place] LEVEL OF CARE SCREENING TOOL     IDENTIFICATION  Patient Name: [name] Birthdate: 1922-03-02 Sex: male Admission Date (Current Location): 04/14/2018  [facility] and [place] Number:  [employer] and Address:  [place] [address], [place]      Provider Number: [id number]  Attending Physician Name and Address:  Orson Eva, MD  Relative Name and Phone Number:       Current Level of Care: Hospital Recommended Level of Care: [place] Prior Approval Number:    Date Approved/Denied:   PASRR Number:    Discharge Plan: SNF    Current Diagnoses: Patient Active Problem List   Diagnosis Date Noted  . Hydronephrosis   . Syncope and collapse   . Acute metabolic encephalopathy 17/61/6073  . Failure to thrive in adult 04/14/2018  . Acute urinary retention 04/14/2018  . Fecal impaction ([place]) 04/14/2018  . Goals of care, counseling/discussion   . Palliative care by specialist   . DNR (do not resuscitate) discussion   . Gout 01/14/2018  . Lobar pneumonia, unspecified organism ([place]) 10/29/2017  . Pleural effusion, right   . Pedal edema 05/14/2017  . Parkinson's disease ([place]) 10/05/2016  . Diastolic CHF, chronic ([place]) 05/10/2015  . Dizziness   . Coronary artery disease involving coronary bypass graft of native heart without angina pectoris   . Near syncope 04/02/2015  . Diarrhea 04/02/2015  . COPD exacerbation ([place]) 06/04/2013  . Shortness of breath 06/04/2013  . Acute respiratory failure with hypoxia ([place]) 06/04/2013  . Hemoptysis 08/10/2012  . Prediabetes 08/28/2011  . Lung cancer ([place]) 08/25/2011  . COPD (chronic obstructive pulmonary disease) ([place]) 08/25/2011  . CKD (chronic kidney disease), stage III ([place]) 12/07/2010  . Chest pain 08/18/2010  . Abdominal pain 08/18/2010  . Syncope   . Tobacco abuse, in remission   . Degenerative joint disease of knee, left   . COLONIC POLYPS 05/20/2009   . Hyperlipidemia 05/20/2009  . ANEMIA 05/20/2009  . Essential tremor 05/20/2009  . ATHEROSCLEROTIC CARDIOVASCULAR DISEASE 05/20/2009  . CEREBROVASCULAR DISEASE 05/20/2009  . Essential hypertension 01/14/2009    Orientation RESPIRATION BLADDER Height & Weight     Self, Place  Normal Continent Weight: 55.3 kg Height:  5\' 7"  (170.2 cm)  BEHAVIORAL SYMPTOMS/MOOD NEUROLOGICAL BOWEL NUTRITION STATUS  (none) (none) Continent Diet(Regular)  AMBULATORY STATUS COMMUNICATION OF NEEDS Skin   Extensive Assist Verbally Normal                       Personal Care Assistance Level of Assistance  Bathing, Feeding, Dressing Bathing Assistance: Limited assistance Feeding assistance: Independent Dressing Assistance: Limited assistance     Functional Limitations Info  Sight, Hearing, Speech Sight Info: Adequate Hearing Info: Adequate Speech Info: Adequate    SPECIAL CARE FACTORS FREQUENCY  PT (By licensed PT)     PT Frequency: 5X/W              Contractures Contractures Info: Not present    Additional Factors Info  Code Status, Allergies Code Status Info: Full Allergies Info: Levaquin, Penicillins, Sulfonamide Derivatives, Sympathomimetics, Tape           Current Medications (04/15/2018):  This is the current hospital active medication list Current Facility-Administered Medications  Medication Dose Route Frequency Provider Last Rate Last Dose  . 0.9 %  sodium chloride infusion   Intravenous Continuous Tat, Shanon Brow, MD 75 mL/hr at 04/15/18 1005    .  acetaminophen (TYLENOL) tablet 650 mg  650 mg Oral Q6H PRN Tat, David, MD       Or  . acetaminophen (TYLENOL) suppository 650 mg  650 mg Rectal Q6H PRN Tat, Shanon Brow, MD      . aspirin EC tablet 81 mg  81 mg Oral Daily Tat, David, MD   81 mg at 04/15/18 0939  . atorvastatin (LIPITOR) tablet 40 mg  40 mg Oral q1800 Orson Eva, MD   40 mg at 04/14/18 1843  . carbidopa-levodopa (SINEMET IR) 25-100 MG per tablet immediate release 3  tablet  3 tablet Oral TID Orson Eva, MD   3 tablet at 04/15/18 [phone]  . guaiFENesin (MUCINEX) 12 hr tablet 600 mg  600 mg Oral BID Tat, Shanon Brow, MD   600 mg at 04/15/18 0939  . heparin injection 5,000 Units  5,000 Units Subcutaneous Franco Collet, MD   5,000 Units at 04/15/18 0531  . ipratropium-albuterol (DUONEB) 0.5-2.5 (3) MG/3ML nebulizer solution 3 mL  3 mL Nebulization BID Tat, David, MD   3 mL at 04/15/18 0746  . ipratropium-albuterol (DUONEB) 0.5-2.5 (3) MG/3ML nebulizer solution 3 mL  3 mL Nebulization Q4H PRN Tat, David, MD      . metoprolol tartrate (LOPRESSOR) tablet 25 mg  25 mg Oral BID Orson Eva, MD   25 mg at 04/15/18 0939  . mometasone-formoterol (DULERA) 200-5 MCG/ACT inhaler 2 puff  2 puff Inhalation BID Orson Eva, MD   2 puff at 04/15/18 0746  . multivitamin with minerals tablet 1 tablet  1 tablet Oral Daily Tat, David, MD   1 tablet at 04/15/18 [phone]  . ondansetron (ZOFRAN) tablet 4 mg  4 mg Oral Q6H PRN Tat, David, MD       Or  . ondansetron (ZOFRAN) injection 4 mg  4 mg Intravenous Q6H PRN Tat, David, MD      . pantoprazole (PROTONIX) EC tablet 40 mg  40 mg Oral Daily Tat, David, MD   40 mg at 04/15/18 0939  . polyethylene glycol (MIRALAX / GLYCOLAX) packet 17 g  17 g Oral BID Orson Eva, MD   17 g at 04/15/18 0943  . senna (SENOKOT) tablet 17.2 mg  2 tablet Oral BID Dove, Tasha A, NP   17.2 mg at 04/15/18 0939  . tamsulosin (FLOMAX) capsule 0.4 mg  0.4 mg Oral Benay Pike, MD   0.4 mg at 04/14/18 2123     Discharge Medications: Please see discharge summary for a list of discharge medications.  Relevant Imaging Results:  Relevant Lab Results:   Additional [place], LCSW

## 2018-04-15 NOTE — Progress Notes (Signed)
*  PRELIMINARY RESULTS* Echocardiogram 2D Echocardiogram has been performed.  [name] 04/15/2018, 3:02 PM

## 2018-04-15 NOTE — Clinical Social Work Note (Signed)
PASSR number received. Called Tami at [facility] to find out about pending bed offer.  After reviewing chart, she reported that it was noted he had been wandering.  She stated she would need someone else to review chart on Monday.  Bed offer on hold.

## 2018-04-15 NOTE — Progress Notes (Signed)
Palliative:   I have reviewed medical records including EPIC notes, labs and imaging, assessed the patient and then met at the bedside to discuss diagnosis prognosis, GOC, EOL wishes, disposition and options.  Mr. Xiang is resting quietly in bed.  He greets me making and keeping eye contact.  He is calm and cooperative, alert and oriented x3.  Present today at bedside is wife Stanton Kidney, son Ronalee Belts who lives in the home, daughters Revonda Standard and [place].  I introduced Palliative Medicine as specialized medical care for people living with serious illness. It focuses on providing relief from the symptoms and stress of a serious illness. The goal is to improve quality of life for both the patient and the family.  I share that PMT is there and had an extra layer of support, answer questions.  There is no agenda.  We discussed a brief life review of the patient.  Mr. 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  Daughter Asencion Partridge states that he is still active as an Clinical biochemist, offering no advice when needed.    We reviewed Mr. Takahashi chronic and acute health problems in detail.  He and family are able to accurately name all of his problems.  We reviewed labs in detail.  We also reviewed the treatment plan in detail.  As far as functional and nutritional status, wife Stanton Kidney states that Mr. Vercher has lost weight recently, but has been eating more recently.  She shares that she feels he does not eat enough fruit, there by causing constipation.  I share that until we get his bowels moving, he will likely not be hungry, but I encouraged him to take in liquids.  Mr. Pletz and Ronalee Belts share that when the weather is nice they walk anywhere from 1/8-1/4 of a mile, Mr. Huy using a walker.    We discussed current illness and what it means in the larger context of on-going co-morbidities.  I share a diagram of the chronic illness pathway, what is normal and expected.  We also talked about natural disease  trajectory, I share that we see changes in the hair in the skin, there are changes on the inside to each body system also.   We talked about healthcare power of attorney.  Horris Latino has [place], but there is no healthcare power of attorney.  I share that unless Mr. Sassano wants to name one specific person, they do not need healthcare power of attorney paperwork.  Family states they make decisions about healthcare as a group.  I reinforced with Mr. Arzuaga that he decides, not doctors, not family.  We talked about how to make choices for loved ones including 1) keeping them at the center of decision-making 2) are we doing something for him or to him (can we change what is happening) 3) if his quality declines and he is unable to speak, listen to the voice of the hail and hardy Mr. Deridder, what would be important to him.  I attempted to elicit values and goals of care important to the patient.  He tells me that he is willing to go to rehab to get some strength back.  He tells me that being independent and able to care for himself, being able to walk is important to him.  Chaplain Joya Gaskins arrives to offer support.  Daughter Asencion Partridge states that she has been in contact with the chief nursing officer at this hospital who has worked with [facility]  nursing center to secure a bed for Mr. Irion.  Advanced directives, concepts specific to code status, artifical feeding and hydration were considered and discussed.  We talked about the realities of CPR and intubation.  We talked about the concept of treat the treatable but no extraordinary measures.  At this point Mr. Illes states that he would like to "allow a natural death".  Patient and family agrees to DNR status.  Questions and concerns were addressed.  The family was encouraged to call with questions or concerns.  Chaplain Wright left at bedside to provide further spiritual/psychosocial support.  75 minutes, extended time.  Quinn Axe, NP Palliative Medicine  Team Team Phone # [phone]  Greater than 50% of this time was spent counseling and coordinating care related to the above assessment and plan.

## 2018-04-15 NOTE — Progress Notes (Signed)
PROGRESS NOTE  DEAGLAN LILE FFM:[id number] DOB: 1921-11-14 DOA: 04/14/2018 PCP: Kathyrn Drown, MD  Brief History:  85 y.o. male with medical history of coronary artery disease, COPD, Parkinson's disease, chronic diastolic CHF, horseshoe kidney, hypertension, hyperlipidemia, squamous cell lung cancer, and cognitive impairment presenting with altered mental status.  The patient has been having mechanical falls for the last several months.  The patient had a fall approximately 2 weeks prior to this admission.  The patient's son brought the patient to the emergency department on 03/27/2018.  Work-up at that time revealed that the patient had an odontoid fracture.  Neurosurgery was consulted at that time and felt the patient would benefit from nonoperative management.  The patient was placed in an Aspen collar and discharged home.   Since his fall 2 weeks prior to this admission, the patient's son states that the patient has had a cognitive decline and functional decline worse than his baseline.  He has been having more confusion and wandering.  However further history from the son reveals that the patient has had a gradual cognitive and functional decline over the past year.  The patient's son states that he has been having syncopal episodes, at least 5 since his ED visit.  However further history suggest that these may be mechanical falls or may be due to gait instability.  The patient's son states that the patient is usually falling when repositioning his walker, and the patient falls back onto the sons hands during which time the son feels like the patient may have been "out" for only a few seconds.  However, the patient remains conversant and answers appropriately.  There is no bowel bladder incontinence or tongue biting.  Assessment/Plan: Acute metabolic encephalopathy -Likely multifactorial including recent use of opioids, constipation, urinary retention, and dehydration -Obtain urinalysis  and urine culture -Serum T70--1779 -Folic TJQZ--00.9 -QZR--0.076 -Ammonia--9 -The patient likely has continued progression of his underlying cognitive impairment  Questionable syncopal episodes/orthostatic hypotension -Clinical history does not suggest true syncope -echo--pending -Orthostatic vital signs--positive -EEG--negative for epileptiform discharges -The patient has a component of dysautonomia from his Parkinson's disease  Failure to thrive -Dietitian consult -Liberalize diet -IV fluids -Work-up as above -Palliative medicine consultation for goals of care  Fecal impaction -Still no bowel movement overnight -Increase  MiraLAX bid -soap suds enema -asked RN to assist with Disimpaction  COPD -Stable on room air - Continue duo nebs -Continue Symbicort  Urinary retention/Hydronephrosis -Likely secondary to the patient's stool impaction and severe constipation -Foley catheter placed 04/14/2018 -Plan for voiding trial once the patient's constipation is improved -Continue tamsulosin -renal US post foley to assess hydronephrosis  Coronary artery disease -Stable presently without any chest pain -EKG without concerning ischemic changes -Continue aspirin, Lipitor, metoprolol  Parkinson's disease -Was followed by neurology at [facility] -continue Sinemet    Disposition Plan:   SNF in 1-2 days  Family Communication:  No Family at bedside  Consultants: none   Code Status:  FULL   DVT Prophylaxis:  [place] Heparin    Procedures: As Listed in Progress Note Above  Antibiotics: None     Subjective: Patient still has not had a significant bowel movement overnight.  He states that his abdominal pain is little better.  He denies any fevers, chills, headache, neck pain, chest pain, shortness breath, nausea, vomiting, diarrhea.  Objective: Vitals:   04/14/18 2049 04/14/18 2105 04/15/18 0458 04/15/18 0746  BP:  (!) 117/53 (!) 133/59  Pulse:  95 78   Resp:        Temp:  98.1 F (36.7 C) 98 F (36.7 C)   TempSrc:  Oral Oral   SpO2: 96% 98% 100% 100%  Weight:      Height:        Intake/Output Summary (Last 24 hours) at 04/15/2018 8127 Last data filed at 04/14/2018 1852 Gross per 24 hour  Intake 690 ml  Output 1100 ml  Net -410 ml   Weight change:  Exam:   General:  Pt is alert, follows commands appropriately, not in acute distress  HEENT: No icterus, No thrush, No neck mass, [place]/AT  Cardiovascular: RRR, S1/S2, no rubs, no gallops  Respiratory: Bibasilar rales.  No wheezing.  Good air movement  Abdomen: Soft/+BS, non tender, non distended, no guarding  Extremities: No edema, No lymphangitis, No petechiae, No rashes, no synovitis   Data Reviewed: I have personally reviewed following labs and imaging studies Basic Metabolic Panel: Recent Labs  Lab 04/14/18 0832 04/15/18 0719  NA 136 140  K 4.1 4.0  CL 106 111  CO2 23 23  GLUCOSE 108* 98  BUN 43* 41*  CREATININE 1.85* 1.92*  CALCIUM 8.8* 8.6*   Liver Function Tests: Recent Labs  Lab 04/14/18 0832  AST 20  ALT 5  ALKPHOS 81  BILITOT 0.5  PROT 6.2*  ALBUMIN 3.3*   No results for input(s): LIPASE, AMYLASE in the last 168 hours. Recent Labs  Lab 04/14/18 1357  AMMONIA 9   Coagulation Profile: No results for input(s): INR, PROTIME in the last 168 hours. CBC: Recent Labs  Lab 04/14/18 0832 04/15/18 0719  WBC 9.1 10.1  NEUTROABS 7.0  --   HGB 9.4* 8.4*  HCT 30.1* 27.2*  MCV 98.0 96.8  PLT 290 252   Cardiac Enzymes: Recent Labs  Lab 04/14/18 0832  TROPONINI <0.03   BNP: Invalid input(s): POCBNP CBG: No results for input(s): GLUCAP in the last 168 hours. HbA1C: No results for input(s): HGBA1C in the last 72 hours. Urine analysis:    Component Value Date/Time   COLORURINE YELLOW 04/14/2018 1450   APPEARANCEUR CLEAR 04/14/2018 1450   LABSPEC 1.021 04/14/2018 1450   PHURINE 5.0 04/14/2018 1450   GLUCOSEU NEGATIVE 04/14/2018 1450   HGBUR MODERATE  (A) 04/14/2018 1450   BILIRUBINUR NEGATIVE 04/14/2018 1450   KETONESUR NEGATIVE 04/14/2018 1450   PROTEINUR NEGATIVE 04/14/2018 1450   UROBILINOGEN 0.2 10/21/2013 1655   NITRITE NEGATIVE 04/14/2018 1450   LEUKOCYTESUR NEGATIVE 04/14/2018 1450   Sepsis Labs: @LABRCNTIP (procalcitonin:4,lacticidven:4) )No results found for this or any previous visit (from the past 240 hour(s)).   Scheduled Meds: . aspirin EC  81 mg Oral Daily  . atorvastatin  40 mg Oral q1800  . bisacodyl  10 mg Rectal Once  . carbidopa-levodopa  3 tablet Oral TID  . guaiFENesin  600 mg Oral BID  . heparin  5,000 Units Subcutaneous Q8H  . ipratropium-albuterol  3 mL Nebulization BID  . metoprolol tartrate  25 mg Oral BID  . mometasone-formoterol  2 puff Inhalation BID  . multivitamin with minerals  1 tablet Oral Daily  . pantoprazole  40 mg Oral Daily  . polyethylene glycol  17 g Oral BID  . senna  2 tablet Oral BID  . tamsulosin  0.4 mg Oral QHS   Continuous Infusions: . sodium chloride 75 mL/hr at 04/14/18 1442    Procedures/Studies: Dg Chest 2 View  Result Date: 04/14/2018 CLINICAL DATA:  Weakness and  syncope. Fell today. History of lung cancer. EXAM: CHEST - 2 VIEW COMPARISON:  Multiple prior chest x-rays from 2019 and 2018. FINDINGS: The cardiac silhouette, mediastinal and hilar contours are within normal limits and stable. Moderate tortuosity and calcification of the thoracic aorta is again demonstrated. Prior bypass surgery. Chronic left apical scarring changes and probable radiation changes. Underlying emphysema. No acute overlying pulmonary findings. Chronic right basilar scarring changes and pleural thickening. The bony thorax is intact. IMPRESSION: Chronic emphysematous changes, pulmonary scarring and left upper lobe scarring and radiation. No acute overlying pulmonary process. Electronically Signed   By: Marijo Sanes M.D.   On: 04/14/2018 08:10   Dg Cervical Spine Complete  Result Date:  03/27/2018 CLINICAL DATA:  Blacked out.  Fall. EXAM: CERVICAL SPINE - COMPLETE 4+ VIEW COMPARISON:  CT scan March 10, 2011 FINDINGS: Dense calcifications in the neck or consistent with carotid calcifications. Minimal anterolisthesis of C2 versus C3 is unchanged and likely degenerative. Minimal anterolisthesis of C7 versus T1 is stable as well. No other malalignment identified. No fractures are seen. Multilevel degenerative disc disease. The neural foramina are patent. Chronic pleuroparenchymal thickening in the left apex is stable since at least December 2018. The superior most sternal wire is fractured, also stable. A dedicated odontoid view is limited. IMPRESSION: 1. This study is significantly limited due to positioning and osteopenia. No definitive fracture or traumatic malalignment. However, if there is clinical concern for cervical spine fracture, a CT scan is recommended. 2. Dense carotid calcifications. 3. Chronic changes in the left apex of the lung. Electronically Signed   By: Dorise Bullion III M.D   On: 03/27/2018 09:16   Ct Head Wo Contrast  Result Date: 04/14/2018 CLINICAL DATA:  Tremors and altered mental status EXAM: CT HEAD WITHOUT CONTRAST TECHNIQUE: Contiguous axial images were obtained from the base of the skull through the vertex without intravenous contrast. COMPARISON:  March 27, 2018 FINDINGS: Brain: Mild diffuse atrophy is stable. There is no intracranial mass, hemorrhage, extra-axial fluid collection, or midline shift. There is patchy small vessel disease in the centra semiovale bilaterally. There is an apparent old infarct in the peripheral left temporal-occipital region, stable. No acute infarct is appreciable. Vascular: There is no evident vascular calcification. There is calcification in each distal vertebral artery and carotid siphon region. Skull: Bony calvarium appears intact. Sinuses/Orbits: There is mucosal thickening in multiple ethmoid air cells bilaterally. Other  visualized paranasal sinuses are clear. Orbits appear symmetric bilaterally. Other: Mastoid air cells are clear. IMPRESSION: Atrophy with supratentorial small vessel disease. Prior left temporal-occipital infarct. No acute infarct. No mass or hemorrhage. There are foci of arterial vascular calcification. There is mucosal thickening in several ethmoid air cells. Electronically Signed   By: Lowella Grip III M.D.   On: 04/14/2018 10:30   Ct Head Wo Contrast  Result Date: 03/27/2018 CLINICAL DATA:  Fall after loss of consciousness.  Facial trauma. EXAM: CT HEAD WITHOUT CONTRAST CT MAXILLOFACIAL WITHOUT CONTRAST TECHNIQUE: Multidetector CT imaging of the head and maxillofacial structures were performed using the standard protocol without intravenous contrast. Multiplanar CT image reconstructions of the maxillofacial structures were also generated. COMPARISON:  CT scan of July 28, 2016. FINDINGS: CT HEAD FINDINGS Brain: Mild diffuse cortical atrophy is noted. Mild chronic ischemic white matter disease is noted. No mass effect or midline shift is noted. Ventricular size is within normal limits. There is no evidence of mass lesion, hemorrhage or acute infarction. Vascular: No hyperdense vessel or unexpected calcification. Skull: Normal. Negative  for fracture or focal lesion. Other: None. CT MAXILLOFACIAL FINDINGS Osseous: Mildly displaced type 2 odontoid fracture of C2 is noted. The mandible is unremarkable. No other bony abnormality is noted in maxillofacial region. Orbits: Negative. No traumatic or inflammatory finding. Sinuses: Clear. Soft tissues: Negative. IMPRESSION: Mild diffuse cortical atrophy. Mild chronic ischemic white matter disease. No acute intracranial abnormality seen. Mildly displaced type 2 odontoid fracture of C2 is noted. Critical Value/emergent results were called by telephone at the time of interpretation on 03/27/2018 at 9:29 am to Dr. Kris Mouton, who verbally acknowledged these results. No  definite abnormality seen in maxillofacial region. Electronically Signed   By: Marijo Conception, M.D.   On: 03/27/2018 09:30   Ct Cervical Spine Wo Contrast  Result Date: 03/27/2018 CLINICAL DATA:  Was walking today and suffered a loss of consciousness, fall, multiple lacerations to face; history lung cancer, hypertension, coronary artery disease, former smoker; abnormal CT facial bones EXAM: CT CERVICAL SPINE WITHOUT CONTRAST TECHNIQUE: Multidetector CT imaging of the cervical spine was performed without intravenous contrast. Multiplanar CT image reconstructions were also generated. COMPARISON:  03/10/2011 CT cervical spine, chest radiograph 11/22/2017 FINDINGS: Alignment: Mild anterolisthesis at C7-T1, unchanged. Remaining alignments normal Skull base and vertebrae: Diffuse osseous demineralization. Visualized skull base intact. Nondisplaced type II odontoid fracture, minimally oblique anterior to posterior. Prominent calcified pannus posterior to the odontoid process. Vertebral body heights maintained. No additional fracture, subluxation or bone destruction. Multilevel disc space narrowing and endplate spur formation. Multilevel facet degenerative changes bilaterally. Soft tissues and spinal canal: Prevertebral soft tissues normal thickness. Dense atherosclerotic calcifications aorta. Disc levels:  No additional abnormalities Upper chest: RIGHT lung apex clear. Opacified LEFT lung apex, corresponding to pleural fluid/thickening on prior chest radiographs, question sequela of treatment of lung cancer. Other: N/A IMPRESSION: Nondisplaced minimally oblique type II odontoid fracture. Osseous demineralization with multilevel degenerative disc and facet disease changes of the cervical spine. Pleural scarring and effusion at LEFT apex unchanged since prior chest radiograph, question sequela of treatment for lung cancer. Electronically Signed   By: Lavonia Dana M.D.   On: 03/27/2018 10:27   Ct Abdomen Pelvis W  Contrast  Result Date: 04/14/2018 CLINICAL DATA:  Abdominal pain, acute, generalized. EXAM: CT ABDOMEN AND PELVIS WITH CONTRAST TECHNIQUE: Multidetector CT imaging of the abdomen and pelvis was performed using the standard protocol following bolus administration of intravenous contrast. CONTRAST:  50mL OMNIPAQUE IOHEXOL 300 MG/ML  SOLN COMPARISON:  10/29/2017 FINDINGS: Lower chest: Moderate sliding hiatal hernia. Extensive coronary calcification. Scarring in the right lower lobe with volume loss and mild bronchiectasis. Mild centrilobular emphysema. Hepatobiliary: No focal liver abnormality.No evidence of biliary obstruction or stone. Pancreas: Unremarkable. Spleen: Unremarkable. Adrenals/Urinary Tract: Negative adrenals. Horseshoe kidney. Bilateral hydroureteronephrosis likely from the very distended bladder which is likely patulous at baseline due to the partially folded appearance. Stomach/Bowel: Formed stool throughout much of the colon, most notably the rectum which is distended to 10 cm and fills the pelvis. No evident bowel inflammation. The appendix is difficult to discretely visualize. Vascular/Lymphatic: Extensive atherosclerotic calcification with high-grade bilateral common femoral and superficial femoral stenosis. No mass or adenopathy. Reproductive:Negative Other: No ascites or pneumoperitoneum. Musculoskeletal: Advanced spinal degeneration. No acute osseous finding. IMPRESSION: 1. Constipation with rectal impaction and distention to 10 cm. 2. Dilated bladder, possibly related to#1, with bilateral hydronephrosis of the horseshoe kidneys. 3. Chronic findings are described above. Electronically Signed   By: Monte Fantasia M.D.   On: 04/14/2018 10:30   Ct Maxillofacial Wo Cm  Result Date: 03/27/2018 CLINICAL DATA:  Fall after loss of consciousness.  Facial trauma. EXAM: CT HEAD WITHOUT CONTRAST CT MAXILLOFACIAL WITHOUT CONTRAST TECHNIQUE: Multidetector CT imaging of the head and maxillofacial  structures were performed using the standard protocol without intravenous contrast. Multiplanar CT image reconstructions of the maxillofacial structures were also generated. COMPARISON:  CT scan of July 28, 2016. FINDINGS: CT HEAD FINDINGS Brain: Mild diffuse cortical atrophy is noted. Mild chronic ischemic white matter disease is noted. No mass effect or midline shift is noted. Ventricular size is within normal limits. There is no evidence of mass lesion, hemorrhage or acute infarction. Vascular: No hyperdense vessel or unexpected calcification. Skull: Normal. Negative for fracture or focal lesion. Other: None. CT MAXILLOFACIAL FINDINGS Osseous: Mildly displaced type 2 odontoid fracture of C2 is noted. The mandible is unremarkable. No other bony abnormality is noted in maxillofacial region. Orbits: Negative. No traumatic or inflammatory finding. Sinuses: Clear. Soft tissues: Negative. IMPRESSION: Mild diffuse cortical atrophy. Mild chronic ischemic white matter disease. No acute intracranial abnormality seen. Mildly displaced type 2 odontoid fracture of C2 is noted. Critical Value/emergent results were called by telephone at the time of interpretation on 03/27/2018 at 9:29 am to Dr. Kris Mouton, who verbally acknowledged these results. No definite abnormality seen in maxillofacial region. Electronically Signed   By: Marijo Conception, M.D.   On: 03/27/2018 09:30    Orson Eva, DO  [facility] Pager [phone]  If 7PM-7AM, please contact night-coverage [email or web address] Password [email or web address] 04/15/2018, 9:22 AM   LOS: 0 days

## 2018-04-15 NOTE — Progress Notes (Signed)
PT Cancellation Note  Patient Details Name: [name] MRN: [id number] DOB: [birth year]   Cancelled Treatment:    Reason Eval/Treat Not Completed: Other (comment);Patient at procedure or test/unavailable Attempted PT session.  RN in room giving [place], not available for therapy right now.  [address], LPTA; [place]  Aldona Lento 04/15/2018, 2:31 PM

## 2018-04-15 NOTE — Clinical Social Work Placement (Signed)
   CLINICAL SOCIAL WORK PLACEMENT  NOTE  Date:  04/15/2018  Patient Details  Name: [name] MRN: [id number] Date of Birth: Jul 06, 1921  Clinical Social Work is seeking post-discharge placement for this patient at the [place] level of care (*CSW will initial, date and re-position this form in  chart as items are completed):  Yes   Patient/family provided with [place] Work Department's list of facilities offering this level of care within the geographic area requested by the patient (or if unable, by the patient's family).  Yes   Patient/family informed of their freedom to choose among providers that offer the needed level of care, that participate in Medicare, Medicaid or managed care program needed by the patient, have an available bed and are willing to accept the patient.  Yes   Patient/family informed of [place]'s ownership interest in [facility] and [facility], as well as of the fact that they are under no obligation to receive care at these facilities.  PASRR submitted to EDS on 04/14/18     PASRR number received on       Existing PASRR number confirmed on       FL2 transmitted to all facilities in geographic area requested by pt/family on 04/15/18     FL2 transmitted to all facilities within larger geographic area on       Patient informed that his/her managed care company has contracts with or will negotiate with certain facilities, including the following:            Patient/family informed of bed offers received.  Patient chooses bed at       Physician recommends and patient chooses bed at      Patient to be transferred to   on  .  Patient to be transferred to facility by       Patient family notified on   of transfer.  Name of family member notified:        PHYSICIAN       Additional Comment: Family [brother, who lives with patient and mother and provides care in the home, and sister, [employer]  employee [phone]] are requesting referral to [facility] and state that is the only place they want. Info sent to [facility].  Awaiting response from PASSR after sending additional requested information.   _______________________________________________ Trish Mage, LCSW 04/15/2018, 1:23 PM

## 2018-04-15 NOTE — NC FL2 (Signed)
[place] LEVEL OF CARE SCREENING TOOL     IDENTIFICATION  Patient Name: [name] Birthdate: 08/12/1921 Sex: male Admission Date (Current Location): 04/14/2018  [facility] and [place] Number:  [employer] and Address:  [place] [address], [place]      Provider Number: [id number]  Attending Physician Name and Address:  Orson Eva, MD  Relative Name and Phone Number:       Current Level of Care: Hospital Recommended Level of Care: [place] Prior Approval Number:    Date Approved/Denied:   PASRR Number: [id number]  Discharge Plan: SNF    Current Diagnoses: Patient Active Problem List   Diagnosis Date Noted  . Hydronephrosis   . Syncope and collapse   . Acute metabolic encephalopathy 69/67/8938  . Failure to thrive in adult 04/14/2018  . Acute urinary retention 04/14/2018  . Fecal impaction ([place]) 04/14/2018  . Goals of care, counseling/discussion   . Palliative care by specialist   . DNR (do not resuscitate) discussion   . Gout 01/14/2018  . Lobar pneumonia, unspecified organism ([place]) 10/29/2017  . Pleural effusion, right   . Pedal edema 05/14/2017  . Parkinson's disease ([place]) 10/05/2016  . Diastolic CHF, chronic ([place]) 05/10/2015  . Dizziness   . Coronary artery disease involving coronary bypass graft of native heart without angina pectoris   . Near syncope 04/02/2015  . Diarrhea 04/02/2015  . COPD exacerbation ([place]) 06/04/2013  . Shortness of breath 06/04/2013  . Acute respiratory failure with hypoxia ([place]) 06/04/2013  . Hemoptysis 08/10/2012  . Prediabetes 08/28/2011  . Lung cancer ([place]) 08/25/2011  . COPD (chronic obstructive pulmonary disease) ([place]) 08/25/2011  . CKD (chronic kidney disease), stage III ([place]) 12/07/2010  . Chest pain 08/18/2010  . Abdominal pain 08/18/2010  . Syncope   . Tobacco abuse, in remission   . Degenerative joint disease of knee, left   . COLONIC POLYPS  05/20/2009  . Hyperlipidemia 05/20/2009  . ANEMIA 05/20/2009  . Essential tremor 05/20/2009  . ATHEROSCLEROTIC CARDIOVASCULAR DISEASE 05/20/2009  . CEREBROVASCULAR DISEASE 05/20/2009  . Essential hypertension 01/14/2009    Orientation RESPIRATION BLADDER Height & Weight     Self, Place  Normal Continent Weight: 55.3 kg Height:  5\' 7"  (170.2 cm)  BEHAVIORAL SYMPTOMS/MOOD NEUROLOGICAL BOWEL NUTRITION STATUS  (none) (none) Continent Diet(Regular)  AMBULATORY STATUS COMMUNICATION OF NEEDS Skin   Extensive Assist Verbally Normal                       Personal Care Assistance Level of Assistance  Bathing, Feeding, Dressing Bathing Assistance: Limited assistance Feeding assistance: Independent Dressing Assistance: Limited assistance     Functional Limitations Info  Sight, Hearing, Speech Sight Info: Adequate Hearing Info: Adequate Speech Info: Adequate    SPECIAL CARE FACTORS FREQUENCY  PT (By licensed PT)     PT Frequency: 5X/W              Contractures Contractures Info: Not present    Additional Factors Info  Code Status, Allergies Code Status Info: Full Allergies Info: Levaquin, Penicillins, Sulfonamide Derivatives, Sympathomimetics, Tape           Current Medications (04/15/2018):  This is the current hospital active medication list Current Facility-Administered Medications  Medication Dose Route Frequency Provider Last Rate Last Dose  . 0.9 %  sodium chloride infusion   Intravenous Continuous Tat, Shanon Brow, MD 75 mL/hr at 04/15/18 1005    .  acetaminophen (TYLENOL) tablet 650 mg  650 mg Oral Q6H PRN Tat, David, MD       Or  . acetaminophen (TYLENOL) suppository 650 mg  650 mg Rectal Q6H PRN Tat, Shanon Brow, MD      . aspirin EC tablet 81 mg  81 mg Oral Daily Tat, David, MD   81 mg at 04/15/18 0939  . atorvastatin (LIPITOR) tablet 40 mg  40 mg Oral q1800 Orson Eva, MD   40 mg at 04/14/18 1843  . carbidopa-levodopa (SINEMET IR) 25-100 MG per tablet immediate  release 3 tablet  3 tablet Oral TID Orson Eva, MD   3 tablet at 04/15/18 [phone]  . guaiFENesin (MUCINEX) 12 hr tablet 600 mg  600 mg Oral BID Tat, Shanon Brow, MD   600 mg at 04/15/18 0939  . heparin injection 5,000 Units  5,000 Units Subcutaneous Franco Collet, MD   5,000 Units at 04/15/18 1425  . ipratropium-albuterol (DUONEB) 0.5-2.5 (3) MG/3ML nebulizer solution 3 mL  3 mL Nebulization BID Tat, David, MD   3 mL at 04/15/18 0746  . ipratropium-albuterol (DUONEB) 0.5-2.5 (3) MG/3ML nebulizer solution 3 mL  3 mL Nebulization Q4H PRN Tat, David, MD      . metoprolol tartrate (LOPRESSOR) tablet 25 mg  25 mg Oral BID Orson Eva, MD   25 mg at 04/15/18 0939  . mometasone-formoterol (DULERA) 200-5 MCG/ACT inhaler 2 puff  2 puff Inhalation BID Orson Eva, MD   2 puff at 04/15/18 0746  . multivitamin with minerals tablet 1 tablet  1 tablet Oral Daily Tat, David, MD   1 tablet at 04/15/18 [phone]  . ondansetron (ZOFRAN) tablet 4 mg  4 mg Oral Q6H PRN Tat, David, MD       Or  . ondansetron (ZOFRAN) injection 4 mg  4 mg Intravenous Q6H PRN Tat, David, MD      . pantoprazole (PROTONIX) EC tablet 40 mg  40 mg Oral Daily Tat, David, MD   40 mg at 04/15/18 0939  . polyethylene glycol (MIRALAX / GLYCOLAX) packet 17 g  17 g Oral BID Orson Eva, MD   17 g at 04/15/18 0943  . senna (SENOKOT) tablet 17.2 mg  2 tablet Oral BID Dove, Tasha A, NP   17.2 mg at 04/15/18 0939  . tamsulosin (FLOMAX) capsule 0.4 mg  0.4 mg Oral Benay Pike, MD   0.4 mg at 04/14/18 2123     Discharge Medications: Please see discharge summary for a list of discharge medications.  Relevant Imaging Results:  Relevant Lab Results:   Additional [place], LCSW

## 2018-04-15 NOTE — Telephone Encounter (Signed)
FYI

## 2018-04-15 NOTE — Telephone Encounter (Signed)
Okay so noted

## 2018-04-15 NOTE — Telephone Encounter (Signed)
Pt's son called in to let [name] know to hold off on the hospital bed. Pt has been admitted to [facility] and will be transferred to the [facility] once he is discharged.

## 2018-04-15 NOTE — Progress Notes (Signed)
Present (for part of the discussion) with Quinn Axe, NP/Palliative Medicine for supportive presence for goals of care discussion, focusing on addressing fears/hopes. Family appears supportive and open to discussion focused on Mr Stoudt and his care.

## 2018-04-15 NOTE — Care Management Obs Status (Signed)
[place] NOTIFICATION   Patient Details  Name: ERIKSON DANZY MRN: [id number] Date of Birth: [birth year]   Medicare Observation Status Notification Given:       Shelda Altes 04/15/2018, 11:22 AM

## 2018-04-16 DIAGNOSIS — E43 Unspecified severe protein-calorie malnutrition: Secondary | ICD-10-CM | POA: Diagnosis present

## 2018-04-16 DIAGNOSIS — Z85828 Personal history of other malignant neoplasm of skin: Secondary | ICD-10-CM | POA: Diagnosis not present

## 2018-04-16 DIAGNOSIS — G250 Essential tremor: Secondary | ICD-10-CM | POA: Diagnosis present

## 2018-04-16 DIAGNOSIS — R338 Other retention of urine: Secondary | ICD-10-CM | POA: Diagnosis not present

## 2018-04-16 DIAGNOSIS — F0280 Dementia in other diseases classified elsewhere without behavioral disturbance: Secondary | ICD-10-CM | POA: Diagnosis present

## 2018-04-16 DIAGNOSIS — R296 Repeated falls: Secondary | ICD-10-CM | POA: Diagnosis present

## 2018-04-16 DIAGNOSIS — G9341 Metabolic encephalopathy: Secondary | ICD-10-CM | POA: Diagnosis present

## 2018-04-16 DIAGNOSIS — Z888 Allergy status to other drugs, medicaments and biological substances status: Secondary | ICD-10-CM | POA: Diagnosis not present

## 2018-04-16 DIAGNOSIS — E785 Hyperlipidemia, unspecified: Secondary | ICD-10-CM | POA: Diagnosis present

## 2018-04-16 DIAGNOSIS — Z681 Body mass index (BMI) 19 or less, adult: Secondary | ICD-10-CM | POA: Diagnosis not present

## 2018-04-16 DIAGNOSIS — N183 Chronic kidney disease, stage 3 (moderate): Secondary | ICD-10-CM | POA: Diagnosis present

## 2018-04-16 DIAGNOSIS — Q631 Lobulated, fused and horseshoe kidney: Secondary | ICD-10-CM | POA: Diagnosis not present

## 2018-04-16 DIAGNOSIS — Z9181 History of falling: Secondary | ICD-10-CM | POA: Diagnosis not present

## 2018-04-16 DIAGNOSIS — G20 Parkinson's disease: Secondary | ICD-10-CM | POA: Diagnosis present

## 2018-04-16 DIAGNOSIS — J449 Chronic obstructive pulmonary disease, unspecified: Secondary | ICD-10-CM | POA: Diagnosis present

## 2018-04-16 DIAGNOSIS — N1330 Unspecified hydronephrosis: Secondary | ICD-10-CM | POA: Diagnosis present

## 2018-04-16 DIAGNOSIS — Z85118 Personal history of other malignant neoplasm of bronchus and lung: Secondary | ICD-10-CM | POA: Diagnosis not present

## 2018-04-16 DIAGNOSIS — R627 Adult failure to thrive: Secondary | ICD-10-CM | POA: Diagnosis present

## 2018-04-16 DIAGNOSIS — K5641 Fecal impaction: Secondary | ICD-10-CM | POA: Diagnosis present

## 2018-04-16 DIAGNOSIS — M109 Gout, unspecified: Secondary | ICD-10-CM | POA: Diagnosis present

## 2018-04-16 DIAGNOSIS — R55 Syncope and collapse: Secondary | ICD-10-CM | POA: Diagnosis present

## 2018-04-16 DIAGNOSIS — D631 Anemia in chronic kidney disease: Secondary | ICD-10-CM | POA: Diagnosis present

## 2018-04-16 DIAGNOSIS — I951 Orthostatic hypotension: Secondary | ICD-10-CM | POA: Diagnosis present

## 2018-04-16 DIAGNOSIS — S12191D Other nondisplaced fracture of second cervical vertebra, subsequent encounter for fracture with routine healing: Secondary | ICD-10-CM | POA: Diagnosis not present

## 2018-04-16 DIAGNOSIS — I25810 Atherosclerosis of coronary artery bypass graft(s) without angina pectoris: Secondary | ICD-10-CM | POA: Diagnosis present

## 2018-04-16 DIAGNOSIS — R54 Age-related physical debility: Secondary | ICD-10-CM | POA: Diagnosis present

## 2018-04-16 DIAGNOSIS — I130 Hypertensive heart and chronic kidney disease with heart failure and stage 1 through stage 4 chronic kidney disease, or unspecified chronic kidney disease: Secondary | ICD-10-CM | POA: Diagnosis present

## 2018-04-16 DIAGNOSIS — E860 Dehydration: Secondary | ICD-10-CM | POA: Diagnosis present

## 2018-04-16 LAB — BASIC METABOLIC PANEL
Anion gap: 6 (ref 5–15)
BUN: 35 mg/dL — AB (ref 8–23)
CALCIUM: 8.5 mg/dL — AB (ref 8.9–10.3)
CO2: 22 mmol/L (ref 22–32)
Chloride: 111 mmol/L (ref 98–111)
Creatinine, Ser: 1.82 mg/dL — ABNORMAL HIGH (ref 0.61–1.24)
GFR calc Af Amer: 36 mL/min — ABNORMAL LOW (ref >60)
GFR, EST NON AFRICAN AMERICAN: 31 mL/min — AB (ref >60)
Glucose, Bld: 94 mg/dL (ref 70–99)
Potassium: 3.8 mmol/L (ref 3.5–5.1)
Sodium: 139 mmol/L (ref 135–145)

## 2018-04-16 LAB — CBC
HCT: 26 % — ABNORMAL LOW (ref 39.0–52.0)
Hemoglobin: 7.9 g/dL — ABNORMAL LOW (ref 13.0–17.0)
MCH: 29.8 pg (ref 26.0–34.0)
MCHC: 30.4 g/dL (ref 30.0–36.0)
MCV: 98.1 fL (ref 80.0–100.0)
Platelets: 238 10*3/uL (ref 150–400)
RBC: 2.65 MIL/uL — ABNORMAL LOW (ref 4.22–5.81)
RDW: 16.3 % — AB (ref 11.5–15.5)
WBC: 10.4 10*3/uL (ref 4.0–10.5)
nRBC: 0 % (ref 0.0–0.2)

## 2018-04-16 MED ORDER — SORBITOL 70 % SOLN
960.0000 mL | TOPICAL_OIL | Freq: Once | ORAL | Status: AC
  Administered 2018-04-16: 960 mL via RECTAL
  Filled 2018-04-16: qty 473

## 2018-04-16 MED ORDER — POLYVINYL ALCOHOL 1.4 % OP SOLN
1.0000 [drp] | OPHTHALMIC | Status: DC | PRN
  Administered 2018-04-16: 1 [drp] via OPHTHALMIC
  Filled 2018-04-16: qty 15

## 2018-04-16 NOTE — Progress Notes (Signed)
Patient had very large brown Type 2 stool per rectum. Smog enema effective.

## 2018-04-16 NOTE — Progress Notes (Signed)
Smog enema administered to patient. Patient had very large brown Type 2 stool per rectum. Patient tolerated enema well.

## 2018-04-16 NOTE — Progress Notes (Addendum)
PROGRESS NOTE  [name]:[id number] DOB: 04-Jun-1921 DOA: 04/14/2018 PCP: Kathyrn Drown, MD  Brief History:  85 y.o.malewith medical history ofcoronary artery disease, COPD, Parkinson's disease, chronic diastolic CHF, horseshoe kidney, hypertension, hyperlipidemia, squamous cell lung cancer, and cognitive impairment presenting with altered mental status. The patient has been having mechanical falls for the last several months. The patient had a fall approximately 2 weeks prior to this admission. The patient's son brought the patient to the emergency department on 03/27/2018. Work-up at that time revealed that the patient had an odontoid fracture. Neurosurgery was consulted at that time and felt the patient would benefit from nonoperative management. The patient was placed in an Aspen collar and discharged home.   Since his fall 2 weeks prior to this admission, the patient's son states that the patient has had a cognitive decline and functional decline worse than his baseline. He has been having more confusion.  Son states that he has been more restless at home walking from room to room requiring more reorientation;however, further history from the son reveals that the patient has had a gradual cognitive and functional decline over the past year. The patient's son states that he has been having syncopal episodes, at least 5 since his ED visit. However further history suggest that these may be mechanical falls or may be due to gait instability. The patient's son states that the patient is usually falling when repositioning his walker, and the patient falls back onto the sons hands during which time the son feels like the patient may have been "out" for only a few seconds. However, the patient remains conversant and answers appropriately. There is no bowel bladder incontinence or tongue biting.  Assessment/Plan: Acute metabolic encephalopathy -Likely multifactorial  including recent use of opioids, constipation, urinary retention, and dehydration -Obtain urinalysis and urine culture--not suggestive of UTI -Serum A83--4196 -Folic QIWL--79.8 -XQJ--1.941 -Ammonia--9 -The patient likely has continued progression of his underlying cognitive impairment -overall mental status has improved, but he remains pleasantly confused  Questionable syncopal episodes/orthostatic hypotension -Clinical history does not suggest true syncope -echo--EF 60-65%, G2 DD, trivial AI, mild MR -Orthostatic vital signs--positive -EEG--negative for epileptiform discharges -The patient has a component of dysautonomia from his Parkinson's disease -Patient was started on IV fluids and fluid resuscitated  Failure to thrive -Dietitian consult -Liberalize diet -IV fluids -Work-up as above -Palliative medicine consultation for goals of care-->DNR  Fecal impaction -Still no bowel movement overnight -Increase  MiraLAX bid -soap suds enema>>> no success -asked RN to assist with Disimpaction>> no success -The patient continues to have fecal impaction and constipation.  Despite aggressive measures including MiraLAX, stimulants, and soapsuds enema, he continues to retain stool at this time -try SMOG enema  CKD stage III -Baseline creatinine 1.8 -2.1 -A.m. BMP  COPD -Stable on room air - Continue duo nebs -Continue Symbicort  Urinary retention/Hydronephrosis -secondary to the patient's stool impaction and severe constipation -Foley catheter placed 04/14/2018 -Plan for voiding trial once the patient's constipation is improved -Continue tamsulosin -renal US post foley to assess hydronephrosis--improved left-sided hydronephrosis, unchanged mild right hydronephrosis  Coronary artery disease -Stable presently without any chest pain -EKG without concerning ischemic changes -Continue aspirin, Lipitor, metoprolol  Parkinson's disease -Was followed by neurology at  [facility] -continue Sinemet  Goals of care -Palliative medicine consulted -Patient is now DNR -The patient has exhibited a functional decline from his premorbid state.  He is not safe to return home  and his premorbid living condition.  He will require physical therapy/Occupational Therapy to improve his functional status and hopes to return to his premorbid functional state.    Disposition Plan:   SNF in 1-2 days  Family Communication:  Family at bedside updated 1/4  Consultants: none   Code Status:  FULL   DVT Prophylaxis:  [place] Heparin    Procedures: As Listed in Progress Note Above  Antibiotics: None     Subjective: The patient still has not had a bowel movement despite another suppository and cathartics and soapsuds enema.  The patient overall states his abdominal pain is improving.  He denies any headache, chest pain, shortness breath, fevers, chills, nausea, vomiting, diarrhea.  Objective: Vitals:   04/15/18 2133 04/16/18 0557 04/16/18 1045 04/16/18 1113  BP:  139/63  (!) 142/63  Pulse:  85  80  Resp:      Temp:      TempSrc:      SpO2: 96% 95% 97%   Weight:      Height:        Intake/Output Summary (Last 24 hours) at 04/16/2018 1250 Last data filed at 04/16/2018 0900 Gross per 24 hour  Intake 720 ml  Output 1950 ml  Net -1230 ml   Weight change:  Exam:   General:  Pt is alert, follows commands appropriately, not in acute distress  HEENT: No icterus, No thrush, No neck mass, [place]/AT  Cardiovascular: RRR, S1/S2, no rubs, no gallops  Respiratory: Fine bibasilar crackles but no wheezing.  Good air movement  Abdomen: Soft/+BS, non tender, non distended, no guarding  Extremities: No edema, No lymphangitis, No petechiae, No rashes, no synovitis   Data Reviewed: I have personally reviewed following labs and imaging studies Basic Metabolic Panel: Recent Labs  Lab 04/14/18 0832 04/15/18 0719 04/16/18 0621  NA 136 140 139  K 4.1 4.0 3.8  CL 106  111 111  CO2 23 23 22   GLUCOSE 108* 98 94  BUN 43* 41* 35*  CREATININE 1.85* 1.92* 1.82*  CALCIUM 8.8* 8.6* 8.5*   Liver Function Tests: Recent Labs  Lab 04/14/18 0832  AST 20  ALT 5  ALKPHOS 81  BILITOT 0.5  PROT 6.2*  ALBUMIN 3.3*   No results for input(s): LIPASE, AMYLASE in the last 168 hours. Recent Labs  Lab 04/14/18 1357  AMMONIA 9   Coagulation Profile: No results for input(s): INR, PROTIME in the last 168 hours. CBC: Recent Labs  Lab 04/14/18 0832 04/15/18 0719 04/16/18 0621  WBC 9.1 10.1 10.4  NEUTROABS 7.0  --   --   HGB 9.4* 8.4* 7.9*  HCT 30.1* 27.2* 26.0*  MCV 98.0 96.8 98.1  PLT 290 252 238   Cardiac Enzymes: Recent Labs  Lab 04/14/18 0832  TROPONINI <0.03   BNP: Invalid input(s): POCBNP CBG: No results for input(s): GLUCAP in the last 168 hours. HbA1C: No results for input(s): HGBA1C in the last 72 hours. Urine analysis:    Component Value Date/Time   COLORURINE YELLOW 04/14/2018 1450   APPEARANCEUR CLEAR 04/14/2018 1450   LABSPEC 1.021 04/14/2018 1450   PHURINE 5.0 04/14/2018 1450   GLUCOSEU NEGATIVE 04/14/2018 1450   HGBUR MODERATE (A) 04/14/2018 1450   BILIRUBINUR NEGATIVE 04/14/2018 1450   KETONESUR NEGATIVE 04/14/2018 1450   PROTEINUR NEGATIVE 04/14/2018 1450   UROBILINOGEN 0.2 10/21/2013 1655   NITRITE NEGATIVE 04/14/2018 1450   LEUKOCYTESUR NEGATIVE 04/14/2018 1450   Sepsis Labs: @LABRCNTIP (procalcitonin:4,lacticidven:4) ) Recent Results (from the past 240 hour(s))  Culture, Urine     Status: None   Collection Time: 04/14/18  2:50 PM  Result Value Ref Range Status   Specimen Description   Final    URINE, CATHETERIZED Performed at [facility], [address], [place], [place] [address]    Special Requests   Final    NONE Performed at [facility], [address] [place], [place] [address]    Culture   Final    NO GROWTH Performed at [facility], [place] [address] [place], [place] [address]    Report  Status 04/15/2018 FINAL  Final     Scheduled Meds: . aspirin EC  81 mg Oral Daily  . atorvastatin  40 mg Oral q1800  . carbidopa-levodopa  3 tablet Oral TID  . guaiFENesin  600 mg Oral BID  . heparin  5,000 Units Subcutaneous Q8H  . ipratropium-albuterol  3 mL Nebulization BID  . metoprolol tartrate  25 mg Oral BID  . mometasone-formoterol  2 puff Inhalation BID  . multivitamin with minerals  1 tablet Oral Daily  . pantoprazole  40 mg Oral Daily  . polyethylene glycol  17 g Oral BID  . senna  2 tablet Oral BID  . sorbitol, milk of mag, mineral oil, glycerin (SMOG) enema  960 mL Rectal Once  . tamsulosin  0.4 mg Oral QHS   Continuous Infusions: . sodium chloride 75 mL/hr at 04/16/18 0036    Procedures/Studies: Dg Chest 2 View  Result Date: 04/14/2018 CLINICAL DATA:  Weakness and syncope. Fell today. History of lung cancer. EXAM: CHEST - 2 VIEW COMPARISON:  Multiple prior chest x-rays from 2019 and 2018. FINDINGS: The cardiac silhouette, mediastinal and hilar contours are within normal limits and stable. Moderate tortuosity and calcification of the thoracic aorta is again demonstrated. Prior bypass surgery. Chronic left apical scarring changes and probable radiation changes. Underlying emphysema. No acute overlying pulmonary findings. Chronic right basilar scarring changes and pleural thickening. The bony thorax is intact. IMPRESSION: Chronic emphysematous changes, pulmonary scarring and left upper lobe scarring and radiation. No acute overlying pulmonary process. Electronically Signed   By: Marijo Sanes M.D.   On: 04/14/2018 08:10   Dg Cervical Spine Complete  Result Date: 03/27/2018 CLINICAL DATA:  Blacked out.  Fall. EXAM: CERVICAL SPINE - COMPLETE 4+ VIEW COMPARISON:  CT scan March 10, 2011 FINDINGS: Dense calcifications in the neck or consistent with carotid calcifications. Minimal anterolisthesis of C2 versus C3 is unchanged and likely degenerative. Minimal anterolisthesis of C7  versus T1 is stable as well. No other malalignment identified. No fractures are seen. Multilevel degenerative disc disease. The neural foramina are patent. Chronic pleuroparenchymal thickening in the left apex is stable since at least December 2018. The superior most sternal wire is fractured, also stable. A dedicated odontoid view is limited. IMPRESSION: 1. This study is significantly limited due to positioning and osteopenia. No definitive fracture or traumatic malalignment. However, if there is clinical concern for cervical spine fracture, a CT scan is recommended. 2. Dense carotid calcifications. 3. Chronic changes in the left apex of the lung. Electronically Signed   By: Dorise Bullion III M.D   On: 03/27/2018 09:16   Ct Head Wo Contrast  Result Date: 04/14/2018 CLINICAL DATA:  Tremors and altered mental status EXAM: CT HEAD WITHOUT CONTRAST TECHNIQUE: Contiguous axial images were obtained from the base of the skull through the vertex without intravenous contrast. COMPARISON:  March 27, 2018 FINDINGS: Brain: Mild diffuse atrophy is stable. There is  no intracranial mass, hemorrhage, extra-axial fluid collection, or midline shift. There is patchy small vessel disease in the centra semiovale bilaterally. There is an apparent old infarct in the peripheral left temporal-occipital region, stable. No acute infarct is appreciable. Vascular: There is no evident vascular calcification. There is calcification in each distal vertebral artery and carotid siphon region. Skull: Bony calvarium appears intact. Sinuses/Orbits: There is mucosal thickening in multiple ethmoid air cells bilaterally. Other visualized paranasal sinuses are clear. Orbits appear symmetric bilaterally. Other: Mastoid air cells are clear. IMPRESSION: Atrophy with supratentorial small vessel disease. Prior left temporal-occipital infarct. No acute infarct. No mass or hemorrhage. There are foci of arterial vascular calcification. There is mucosal  thickening in several ethmoid air cells. Electronically Signed   By: Lowella Grip III M.D.   On: 04/14/2018 10:30   Ct Head Wo Contrast  Result Date: 03/27/2018 CLINICAL DATA:  Fall after loss of consciousness.  Facial trauma. EXAM: CT HEAD WITHOUT CONTRAST CT MAXILLOFACIAL WITHOUT CONTRAST TECHNIQUE: Multidetector CT imaging of the head and maxillofacial structures were performed using the standard protocol without intravenous contrast. Multiplanar CT image reconstructions of the maxillofacial structures were also generated. COMPARISON:  CT scan of [name] 17, 2018. FINDINGS: CT HEAD FINDINGS Brain: Mild diffuse cortical atrophy is noted. Mild chronic ischemic white matter disease is noted. No mass effect or midline shift is noted. Ventricular size is within normal limits. There is no evidence of mass lesion, hemorrhage or acute infarction. Vascular: No hyperdense vessel or unexpected calcification. Skull: Normal. Negative for fracture or focal lesion. Other: None. CT MAXILLOFACIAL FINDINGS Osseous: Mildly displaced type 2 odontoid fracture of C2 is noted. The mandible is unremarkable. No other bony abnormality is noted in maxillofacial region. Orbits: Negative. No traumatic or inflammatory finding. Sinuses: Clear. Soft tissues: Negative. IMPRESSION: Mild diffuse cortical atrophy. Mild chronic ischemic white matter disease. No acute intracranial abnormality seen. Mildly displaced type 2 odontoid fracture of C2 is noted. Critical Value/emergent results were called by telephone at the time of interpretation on 03/27/2018 at 9:29 am to Dr. Kris Mouton, who verbally acknowledged these results. No definite abnormality seen in maxillofacial region. Electronically Signed   By: Marijo Conception, M.D.   On: 03/27/2018 09:30   Ct Cervical Spine Wo Contrast  Result Date: 03/27/2018 CLINICAL DATA:  Was walking today and suffered a loss of consciousness, fall, multiple lacerations to face; history lung cancer,  hypertension, coronary artery disease, former smoker; abnormal CT facial bones EXAM: CT CERVICAL SPINE WITHOUT CONTRAST TECHNIQUE: Multidetector CT imaging of the cervical spine was performed without intravenous contrast. Multiplanar CT image reconstructions were also generated. COMPARISON:  03/10/2011 CT cervical spine, chest radiograph 11/22/2017 FINDINGS: Alignment: Mild anterolisthesis at C7-T1, unchanged. Remaining alignments normal Skull base and vertebrae: Diffuse osseous demineralization. Visualized skull base intact. Nondisplaced type II odontoid fracture, minimally oblique anterior to posterior. Prominent calcified pannus posterior to the odontoid process. Vertebral body heights maintained. No additional fracture, subluxation or bone destruction. Multilevel disc space narrowing and endplate spur formation. Multilevel facet degenerative changes bilaterally. Soft tissues and spinal canal: Prevertebral soft tissues normal thickness. Dense atherosclerotic calcifications aorta. Disc levels:  No additional abnormalities Upper chest: RIGHT lung apex clear. Opacified LEFT lung apex, corresponding to pleural fluid/thickening on prior chest radiographs, question sequela of treatment of lung cancer. Other: N/A IMPRESSION: Nondisplaced minimally oblique type II odontoid fracture. Osseous demineralization with multilevel degenerative disc and facet disease changes of the cervical spine. Pleural scarring and effusion at LEFT apex unchanged since prior  chest radiograph, question sequela of treatment for lung cancer. Electronically Signed   By: Lavonia Dana M.D.   On: 03/27/2018 10:27   Ct Abdomen Pelvis W Contrast  Result Date: 04/14/2018 CLINICAL DATA:  Abdominal pain, acute, generalized. EXAM: CT ABDOMEN AND PELVIS WITH CONTRAST TECHNIQUE: Multidetector CT imaging of the abdomen and pelvis was performed using the standard protocol following bolus administration of intravenous contrast. CONTRAST:  34mL OMNIPAQUE  IOHEXOL 300 MG/ML  SOLN COMPARISON:  10/29/2017 FINDINGS: Lower chest: Moderate sliding hiatal hernia. Extensive coronary calcification. Scarring in the right lower lobe with volume loss and mild bronchiectasis. Mild centrilobular emphysema. Hepatobiliary: No focal liver abnormality.No evidence of biliary obstruction or stone. Pancreas: Unremarkable. Spleen: Unremarkable. Adrenals/Urinary Tract: Negative adrenals. Horseshoe kidney. Bilateral hydroureteronephrosis likely from the very distended bladder which is likely patulous at baseline due to the partially folded appearance. Stomach/Bowel: Formed stool throughout much of the colon, most notably the rectum which is distended to 10 cm and fills the pelvis. No evident bowel inflammation. The appendix is difficult to discretely visualize. Vascular/Lymphatic: Extensive atherosclerotic calcification with high-grade bilateral common femoral and superficial femoral stenosis. No mass or adenopathy. Reproductive:Negative Other: No ascites or pneumoperitoneum. Musculoskeletal: Advanced spinal degeneration. No acute osseous finding. IMPRESSION: 1. Constipation with rectal impaction and distention to 10 cm. 2. Dilated bladder, possibly related to#1, with bilateral hydronephrosis of the horseshoe kidneys. 3. Chronic findings are described above. Electronically Signed   By: Monte Fantasia M.D.   On: 04/14/2018 10:30   US Renal  Result Date: 04/15/2018 CLINICAL DATA:  Hydronephrosis. EXAM: RENAL / URINARY TRACT ULTRASOUND COMPLETE COMPARISON:  CT abdomen pelvis from yesterday. FINDINGS: Right Kidney: Renal measurements: 10.4 x 4.4 x 4.6 cm = volume: 108 mL . Echogenicity within normal limits. Mild right hydronephrosis is unchanged. No mass visualized. Left Kidney: Renal measurements: 10.6 x 4.2 x 3.7 cm = volume: 86 mL. Echogenicity within normal limits. No mass or hydronephrosis visualized. Bladder: Decompressed by Foley catheter. IMPRESSION: 1. Unchanged mild right  hydronephrosis. Resolved left hydronephrosis. Electronically Signed   By: Titus Dubin M.D.   On: 04/15/2018 12:22   Ct Maxillofacial Wo Cm  Result Date: 03/27/2018 CLINICAL DATA:  Fall after loss of consciousness.  Facial trauma. EXAM: CT HEAD WITHOUT CONTRAST CT MAXILLOFACIAL WITHOUT CONTRAST TECHNIQUE: Multidetector CT imaging of the head and maxillofacial structures were performed using the standard protocol without intravenous contrast. Multiplanar CT image reconstructions of the maxillofacial structures were also generated. COMPARISON:  CT scan of [name] 17, 2018. FINDINGS: CT HEAD FINDINGS Brain: Mild diffuse cortical atrophy is noted. Mild chronic ischemic white matter disease is noted. No mass effect or midline shift is noted. Ventricular size is within normal limits. There is no evidence of mass lesion, hemorrhage or acute infarction. Vascular: No hyperdense vessel or unexpected calcification. Skull: Normal. Negative for fracture or focal lesion. Other: None. CT MAXILLOFACIAL FINDINGS Osseous: Mildly displaced type 2 odontoid fracture of C2 is noted. The mandible is unremarkable. No other bony abnormality is noted in maxillofacial region. Orbits: Negative. No traumatic or inflammatory finding. Sinuses: Clear. Soft tissues: Negative. IMPRESSION: Mild diffuse cortical atrophy. Mild chronic ischemic white matter disease. No acute intracranial abnormality seen. Mildly displaced type 2 odontoid fracture of C2 is noted. Critical Value/emergent results were called by telephone at the time of interpretation on 03/27/2018 at 9:29 am to Dr. Kris Mouton, who verbally acknowledged these results. No definite abnormality seen in maxillofacial region. Electronically Signed   By: Marijo Conception, M.D.  On: 03/27/2018 09:30    [name] Eva, DO  [facility] Pager [phone]  If 7PM-7AM, please contact night-coverage [email or web address] Password [email or web address] 04/16/2018, 12:50 PM   LOS: 0 days

## 2018-04-17 DIAGNOSIS — N1330 Unspecified hydronephrosis: Secondary | ICD-10-CM

## 2018-04-17 LAB — BASIC METABOLIC PANEL
Anion gap: 6 (ref 5–15)
BUN: 33 mg/dL — ABNORMAL HIGH (ref 8–23)
CO2: 22 mmol/L (ref 22–32)
Calcium: 8.6 mg/dL — ABNORMAL LOW (ref 8.9–10.3)
Chloride: 111 mmol/L (ref 98–111)
Creatinine, Ser: 1.75 mg/dL — ABNORMAL HIGH (ref 0.61–1.24)
GFR calc Af Amer: 38 mL/min — ABNORMAL LOW (ref >60)
GFR calc non Af Amer: 33 mL/min — ABNORMAL LOW (ref >60)
Glucose, Bld: 97 mg/dL (ref 70–99)
Potassium: 3.8 mmol/L (ref 3.5–5.1)
Sodium: 139 mmol/L (ref 135–145)

## 2018-04-17 LAB — CBC
HCT: 27.3 % — ABNORMAL LOW (ref 39.0–52.0)
Hemoglobin: 8.3 g/dL — ABNORMAL LOW (ref 13.0–17.0)
MCH: 30.1 pg (ref 26.0–34.0)
MCHC: 30.4 g/dL (ref 30.0–36.0)
MCV: 98.9 fL (ref 80.0–100.0)
NRBC: 0 % (ref 0.0–0.2)
Platelets: 241 10*3/uL (ref 150–400)
RBC: 2.76 MIL/uL — ABNORMAL LOW (ref 4.22–5.81)
RDW: 16 % — ABNORMAL HIGH (ref 11.5–15.5)
WBC: 11.2 10*3/uL — ABNORMAL HIGH (ref 4.0–10.5)

## 2018-04-17 MED ORDER — SENNA 8.6 MG PO TABS: 2.0000 | ORAL_TABLET | Freq: Every day | ORAL | Status: DC

## 2018-04-17 MED ORDER — TRAZODONE HCL 50 MG PO TABS
50.0000 mg | ORAL_TABLET | Freq: Once | ORAL | Status: AC
  Administered 2018-04-17: 50 mg via ORAL
  Filled 2018-04-17: qty 1

## 2018-04-17 MED ORDER — SENNA 8.6 MG PO TABS: 2.0000 | ORAL_TABLET | Freq: Every day | ORAL | 0 refills | Status: DC

## 2018-04-17 MED ORDER — SENNA 8.6 MG PO TABS
2.0000 | ORAL_TABLET | Freq: Every day | ORAL | Status: DC
  Administered 2018-04-18: 17.2 mg via ORAL
  Filled 2018-04-17 (×2): qty 2

## 2018-04-17 MED ORDER — POLYETHYLENE GLYCOL 3350 17 G PO PACK: 17.0000 g | PACK | Freq: Every day | ORAL | 0 refills | Status: DC

## 2018-04-17 NOTE — Progress Notes (Signed)
Pt rambling on about having the police to come and "take him home." Attempt to reorient pt. Pt otherwise alert to self, place, time and situation. Pt becomes upset when educated that he can not go home at this time. Pt also upset that he continues to have bowel movements and he can't sleep. New orders received for sleep aid. Will continue to monitor.

## 2018-04-17 NOTE — Progress Notes (Addendum)
PROGRESS NOTE  [name] GYB:[id number] DOB: Aug 19, 1921 DOA: 04/14/2018 PCP: Kathyrn Drown, MD  Brief History: 85 y.o.malewith medical history ofcoronary artery disease, COPD, Parkinson's disease, chronic diastolic CHF, horseshoe kidney, hypertension, hyperlipidemia, squamous cell lung cancer, and cognitive impairment presenting with altered mental status. The patient has been having mechanical falls for the last several months. The patient had a fall approximately 2 weeks prior to this admission. The patient's son brought the patient to the emergency department on 03/27/2018. Work-up at that time revealed that the patient had an odontoid fracture. Neurosurgery was consulted at that time and felt the patient would benefit from nonoperative management. The patient was placed in an Aspen collar and discharged home.   Since his fall 2 weeks prior to this admission, the patient's son states that the patient has had a cognitive decline and functional decline worse than his baseline. He has been having more confusion.  Son states that he has been more restless at home walking from room to room requiring more reorientation;however, further history from the son reveals that the patient has had a gradual cognitive and functional decline over the past year. The patient's son states that he has been having syncopal episodes, at least 5 since his ED visit. However further history suggest that these may be mechanical falls or may be due to gait instability. The patient's son states that the patient is usually falling when repositioning his walker, and the patient falls back onto the sons hands during which time the son feels like the patient may have been "out" for only a few seconds. However, the patient remains conversant and answers appropriately. There is no bowel bladder incontinence or tongue biting.  Assessment/Plan: Acute metabolic encephalopathy -Likely multifactorial  including recent use of opioids, constipation, urinary retention, and dehydration -Obtain urinalysis and urine culture--not suggestive of UTI -Serum A76--8115 -Folic BWIO--03.5 -DHR--4.163 -Ammonia--9 -The patient likely has continued progression of his underlying cognitive impairment -overall mental status has improved, but he remains pleasantly confused with intermittent agitation  Questionable syncopal episodes/orthostatic hypotension -Clinical history does not suggest true syncope -echo--EF 60-65%, G2 DD, trivial AI, mild MR -Orthostatic vital signs--positive -EEG--negative for epileptiform discharges -The patient has a component of dysautonomia from his Parkinson's disease -Patient was started on IV fluids and fluid resuscitated  Failure to thrive -Dietitian consult -Liberalize diet -IV fluids -Work-up as above -Palliative medicine consultation for goals of care-->DNR  Fecal impaction -Still no bowel movement overnight -IncreaseMiraLAX bid -soap suds enema>>> no success -asked RN to assist withDisimpaction>> no success -The patient continues to have fecal impaction and constipation.  Despite aggressive measures including MiraLAX, stimulants, and soapsuds enema, he continues to retain stool at this time -try SMOG enema--> numerous bowel movements  CKD stage III -Baseline creatinine 1.8 -2.1 -A.m. BMP  COPD -Stable on room air -Continueduo nebs -Continue Symbicort  Urinary retention/Hydronephrosis -secondary to the patient's stool impaction and severe constipation -Foley catheter placed1/05/2018 -Plan for voiding trial once the patient's constipation is improved -Continue tamsulosin -renal US post foley to assess hydronephrosis--improved left-sided hydronephrosis, unchanged mild right hydronephrosis  Coronary artery disease -Stable presently without any chest pain -EKG without concerning ischemic changes -Continue aspirin, Lipitor,  metoprolol  Parkinson's disease -Was followed by neurology at [facility] -continue Sinemet  Goals of care -Palliative medicine consulted -Patient is now DNR -The patient has exhibited a functional decline from his premorbid state.  He is not safe to return home and  his premorbid living condition.  He will require physical therapy/Occupational Therapy to improve his functional status and hopes to return to his premorbid functional state.    Disposition Plan:SNF1/6 if bed available Family Communication:Family at bedside updated 1/6  Consultants:none  Code Status: FULL   DVT Prophylaxis: [place] Heparin    Procedures: As Listed in Progress Note Above  Antibiotics: None     Subjective: Patient denies fevers, chills, headache, chest pain, dyspnea, nausea, vomiting, diarrhea, abdominal pain, dysuria, hematuria, hematochezia, and melena.   Objective: Vitals:   04/17/18 0535 04/17/18 0734 04/17/18 1144 04/17/18 1349  BP: (!) 144/66  118/61 (!) 158/67  Pulse: 90  80 85  Resp: 20  19 19   Temp: 98.2 F (36.8 C)  97.8 F (36.6 C) 97.8 F (36.6 C)  TempSrc: Oral  Oral Oral  SpO2:  92% 96% 96%  Weight:      Height:        Intake/Output Summary (Last 24 hours) at 04/17/2018 1705 Last data filed at 04/17/2018 0900 Gross per 24 hour  Intake 720 ml  Output 450 ml  Net 270 ml   Weight change:  Exam:   General:  Pt is alert, follows commands appropriately, not in acute distress  HEENT: No icterus, No thrush, No neck mass, [place]/AT  Cardiovascular: RRR, S1/S2, no rubs, no gallops  Respiratory: Fine bibasilar crackles but no wheezing peer good air movement  Abdomen: Soft/+BS, non tender, non distended, no guarding  Extremities: No edema, No lymphangitis, No petechiae, No rashes, no synovitis   Data Reviewed: I have personally reviewed following labs and imaging studies Basic Metabolic Panel: Recent Labs  Lab 04/14/18 0832 04/15/18 0719 04/16/18 0621  04/17/18 0640  NA 136 140 139 139  K 4.1 4.0 3.8 3.8  CL 106 111 111 111  CO2 23 23 22 22   GLUCOSE 108* 98 94 97  BUN 43* 41* 35* 33*  CREATININE 1.85* 1.92* 1.82* 1.75*  CALCIUM 8.8* 8.6* 8.5* 8.6*   Liver Function Tests: Recent Labs  Lab 04/14/18 0832  AST 20  ALT 5  ALKPHOS 81  BILITOT 0.5  PROT 6.2*  ALBUMIN 3.3*   No results for input(s): LIPASE, AMYLASE in the last 168 hours. Recent Labs  Lab 04/14/18 1357  AMMONIA 9   Coagulation Profile: No results for input(s): INR, PROTIME in the last 168 hours. CBC: Recent Labs  Lab 04/14/18 0832 04/15/18 0719 04/16/18 0621 04/17/18 0640  WBC 9.1 10.1 10.4 11.2*  NEUTROABS 7.0  --   --   --   HGB 9.4* 8.4* 7.9* 8.3*  HCT 30.1* 27.2* 26.0* 27.3*  MCV 98.0 96.8 98.1 98.9  PLT 290 252 238 241   Cardiac Enzymes: Recent Labs  Lab 04/14/18 0832  TROPONINI <0.03   BNP: Invalid input(s): POCBNP CBG: No results for input(s): GLUCAP in the last 168 hours. HbA1C: No results for input(s): HGBA1C in the last 72 hours. Urine analysis:    Component Value Date/Time   COLORURINE YELLOW 04/14/2018 1450   APPEARANCEUR CLEAR 04/14/2018 1450   LABSPEC 1.021 04/14/2018 1450   PHURINE 5.0 04/14/2018 1450   GLUCOSEU NEGATIVE 04/14/2018 1450   HGBUR MODERATE (A) 04/14/2018 1450   BILIRUBINUR NEGATIVE 04/14/2018 1450   KETONESUR NEGATIVE 04/14/2018 1450   PROTEINUR NEGATIVE 04/14/2018 1450   UROBILINOGEN 0.2 10/21/2013 1655   NITRITE NEGATIVE 04/14/2018 1450   LEUKOCYTESUR NEGATIVE 04/14/2018 1450   Sepsis Labs: @LABRCNTIP (procalcitonin:4,lacticidven:4) ) Recent Results (from the past 240 hour(s))  Culture, Urine  Status: None   Collection Time: 04/14/18  2:50 PM  Result Value Ref Range Status   Specimen Description   Final    URINE, CATHETERIZED Performed at [facility], [address] [place], [place] [address]    Special Requests   Final    NONE Performed at [facility], [address], [place], [place]  [address]    Culture   Final    NO GROWTH Performed at [facility], [place] [address], [place], [place] [address]    Report Status 04/15/2018 FINAL  Final     Scheduled Meds: . aspirin EC  81 mg Oral Daily  . atorvastatin  40 mg Oral q1800  . carbidopa-levodopa  3 tablet Oral TID  . guaiFENesin  600 mg Oral BID  . heparin  5,000 Units Subcutaneous Q8H  . ipratropium-albuterol  3 mL Nebulization BID  . metoprolol tartrate  25 mg Oral BID  . mometasone-formoterol  2 puff Inhalation BID  . multivitamin with minerals  1 tablet Oral Daily  . pantoprazole  40 mg Oral Daily  . polyethylene glycol  17 g Oral BID  . [START ON 04/18/2018] senna  2 tablet Oral Daily  . tamsulosin  0.4 mg Oral QHS   Continuous Infusions: . sodium chloride 75 mL/hr at 04/17/18 1326    Procedures/Studies: Dg Chest 2 View  Result Date: 04/14/2018 CLINICAL DATA:  Weakness and syncope. Fell today. History of lung cancer. EXAM: CHEST - 2 VIEW COMPARISON:  Multiple prior chest x-rays from 2019 and 2018. FINDINGS: The cardiac silhouette, mediastinal and hilar contours are within normal limits and stable. Moderate tortuosity and calcification of the thoracic aorta is again demonstrated. Prior bypass surgery. Chronic left apical scarring changes and probable radiation changes. Underlying emphysema. No acute overlying pulmonary findings. Chronic right basilar scarring changes and pleural thickening. The bony thorax is intact. IMPRESSION: Chronic emphysematous changes, pulmonary scarring and left upper lobe scarring and radiation. No acute overlying pulmonary process. Electronically Signed   By: Marijo Sanes M.D.   On: 04/14/2018 08:10   Dg Cervical Spine Complete  Result Date: 03/27/2018 CLINICAL DATA:  Blacked out.  Fall. EXAM: CERVICAL SPINE - COMPLETE 4+ VIEW COMPARISON:  CT scan March 10, 2011 FINDINGS: Dense calcifications in the neck or consistent with carotid calcifications. Minimal anterolisthesis of C2 versus  C3 is unchanged and likely degenerative. Minimal anterolisthesis of C7 versus T1 is stable as well. No other malalignment identified. No fractures are seen. Multilevel degenerative disc disease. The neural foramina are patent. Chronic pleuroparenchymal thickening in the left apex is stable since at least December 2018. The superior most sternal wire is fractured, also stable. A dedicated odontoid view is limited. IMPRESSION: 1. This study is significantly limited due to positioning and osteopenia. No definitive fracture or traumatic malalignment. However, if there is clinical concern for cervical spine fracture, a CT scan is recommended. 2. Dense carotid calcifications. 3. Chronic changes in the left apex of the lung. Electronically Signed   By: Dorise Bullion III M.D   On: 03/27/2018 09:16   Ct Head Wo Contrast  Result Date: 04/14/2018 CLINICAL DATA:  Tremors and altered mental status EXAM: CT HEAD WITHOUT CONTRAST TECHNIQUE: Contiguous axial images were obtained from the base of the skull through the vertex without intravenous contrast. COMPARISON:  March 27, 2018 FINDINGS: Brain: Mild diffuse atrophy is stable. There is no intracranial mass, hemorrhage, extra-axial fluid collection, or midline shift. There is patchy small vessel disease in the centra  semiovale bilaterally. There is an apparent old infarct in the peripheral left temporal-occipital region, stable. No acute infarct is appreciable. Vascular: There is no evident vascular calcification. There is calcification in each distal vertebral artery and carotid siphon region. Skull: Bony calvarium appears intact. Sinuses/Orbits: There is mucosal thickening in multiple ethmoid air cells bilaterally. Other visualized paranasal sinuses are clear. Orbits appear symmetric bilaterally. Other: Mastoid air cells are clear. IMPRESSION: Atrophy with supratentorial small vessel disease. Prior left temporal-occipital infarct. No acute infarct. No mass or hemorrhage.  There are foci of arterial vascular calcification. There is mucosal thickening in several ethmoid air cells. Electronically Signed   By: Lowella Grip III M.D.   On: 04/14/2018 10:30   Ct Head Wo Contrast  Result Date: 03/27/2018 CLINICAL DATA:  Fall after loss of consciousness.  Facial trauma. EXAM: CT HEAD WITHOUT CONTRAST CT MAXILLOFACIAL WITHOUT CONTRAST TECHNIQUE: Multidetector CT imaging of the head and maxillofacial structures were performed using the standard protocol without intravenous contrast. Multiplanar CT image reconstructions of the maxillofacial structures were also generated. COMPARISON:  CT scan of July 28, 2016. FINDINGS: CT HEAD FINDINGS Brain: Mild diffuse cortical atrophy is noted. Mild chronic ischemic white matter disease is noted. No mass effect or midline shift is noted. Ventricular size is within normal limits. There is no evidence of mass lesion, hemorrhage or acute infarction. Vascular: No hyperdense vessel or unexpected calcification. Skull: Normal. Negative for fracture or focal lesion. Other: None. CT MAXILLOFACIAL FINDINGS Osseous: Mildly displaced type 2 odontoid fracture of C2 is noted. The mandible is unremarkable. No other bony abnormality is noted in maxillofacial region. Orbits: Negative. No traumatic or inflammatory finding. Sinuses: Clear. Soft tissues: Negative. IMPRESSION: Mild diffuse cortical atrophy. Mild chronic ischemic white matter disease. No acute intracranial abnormality seen. Mildly displaced type 2 odontoid fracture of C2 is noted. Critical Value/emergent results were called by telephone at the time of interpretation on 03/27/2018 at 9:29 am to Dr. Kris Mouton, who verbally acknowledged these results. No definite abnormality seen in maxillofacial region. Electronically Signed   By: Marijo Conception, M.D.   On: 03/27/2018 09:30   Ct Cervical Spine Wo Contrast  Result Date: 03/27/2018 CLINICAL DATA:  Was walking today and suffered a loss of  consciousness, fall, multiple lacerations to face; history lung cancer, hypertension, coronary artery disease, former smoker; abnormal CT facial bones EXAM: CT CERVICAL SPINE WITHOUT CONTRAST TECHNIQUE: Multidetector CT imaging of the cervical spine was performed without intravenous contrast. Multiplanar CT image reconstructions were also generated. COMPARISON:  03/10/2011 CT cervical spine, chest radiograph 11/22/2017 FINDINGS: Alignment: Mild anterolisthesis at C7-T1, unchanged. Remaining alignments normal Skull base and vertebrae: Diffuse osseous demineralization. Visualized skull base intact. Nondisplaced type II odontoid fracture, minimally oblique anterior to posterior. Prominent calcified pannus posterior to the odontoid process. Vertebral body heights maintained. No additional fracture, subluxation or bone destruction. Multilevel disc space narrowing and endplate spur formation. Multilevel facet degenerative changes bilaterally. Soft tissues and spinal canal: Prevertebral soft tissues normal thickness. Dense atherosclerotic calcifications aorta. Disc levels:  No additional abnormalities Upper chest: RIGHT lung apex clear. Opacified LEFT lung apex, corresponding to pleural fluid/thickening on prior chest radiographs, question sequela of treatment of lung cancer. Other: N/A IMPRESSION: Nondisplaced minimally oblique type II odontoid fracture. Osseous demineralization with multilevel degenerative disc and facet disease changes of the cervical spine. Pleural scarring and effusion at LEFT apex unchanged since prior chest radiograph, question sequela of treatment for lung cancer. Electronically Signed   By: Crist Infante.D.  On: 03/27/2018 10:27   Ct Abdomen Pelvis W Contrast  Result Date: 04/14/2018 CLINICAL DATA:  Abdominal pain, acute, generalized. EXAM: CT ABDOMEN AND PELVIS WITH CONTRAST TECHNIQUE: Multidetector CT imaging of the abdomen and pelvis was performed using the standard protocol following bolus  administration of intravenous contrast. CONTRAST:  69mL OMNIPAQUE IOHEXOL 300 MG/ML  SOLN COMPARISON:  10/29/2017 FINDINGS: Lower chest: Moderate sliding hiatal hernia. Extensive coronary calcification. Scarring in the right lower lobe with volume loss and mild bronchiectasis. Mild centrilobular emphysema. Hepatobiliary: No focal liver abnormality.No evidence of biliary obstruction or stone. Pancreas: Unremarkable. Spleen: Unremarkable. Adrenals/Urinary Tract: Negative adrenals. Horseshoe kidney. Bilateral hydroureteronephrosis likely from the very distended bladder which is likely patulous at baseline due to the partially folded appearance. Stomach/Bowel: Formed stool throughout much of the colon, most notably the rectum which is distended to 10 cm and fills the pelvis. No evident bowel inflammation. The appendix is difficult to discretely visualize. Vascular/Lymphatic: Extensive atherosclerotic calcification with high-grade bilateral common femoral and superficial femoral stenosis. No mass or adenopathy. Reproductive:Negative Other: No ascites or pneumoperitoneum. Musculoskeletal: Advanced spinal degeneration. No acute osseous finding. IMPRESSION: 1. Constipation with rectal impaction and distention to 10 cm. 2. Dilated bladder, possibly related to#1, with bilateral hydronephrosis of the horseshoe kidneys. 3. Chronic findings are described above. Electronically Signed   By: Monte Fantasia M.D.   On: 04/14/2018 10:30   US Renal  Result Date: 04/15/2018 CLINICAL DATA:  Hydronephrosis. EXAM: RENAL / URINARY TRACT ULTRASOUND COMPLETE COMPARISON:  CT abdomen pelvis from yesterday. FINDINGS: Right Kidney: Renal measurements: 10.4 x 4.4 x 4.6 cm = volume: 108 mL . Echogenicity within normal limits. Mild right hydronephrosis is unchanged. No mass visualized. Left Kidney: Renal measurements: 10.6 x 4.2 x 3.7 cm = volume: 86 mL. Echogenicity within normal limits. No mass or hydronephrosis visualized. Bladder:  Decompressed by Foley catheter. IMPRESSION: 1. Unchanged mild right hydronephrosis. Resolved left hydronephrosis. Electronically Signed   By: Titus Dubin M.D.   On: 04/15/2018 12:22   Ct Maxillofacial Wo Cm  Result Date: 03/27/2018 CLINICAL DATA:  Fall after loss of consciousness.  Facial trauma. EXAM: CT HEAD WITHOUT CONTRAST CT MAXILLOFACIAL WITHOUT CONTRAST TECHNIQUE: Multidetector CT imaging of the head and maxillofacial structures were performed using the standard protocol without intravenous contrast. Multiplanar CT image reconstructions of the maxillofacial structures were also generated. COMPARISON:  CT scan of July 28, 2016. FINDINGS: CT HEAD FINDINGS Brain: Mild diffuse cortical atrophy is noted. Mild chronic ischemic white matter disease is noted. No mass effect or midline shift is noted. Ventricular size is within normal limits. There is no evidence of mass lesion, hemorrhage or acute infarction. Vascular: No hyperdense vessel or unexpected calcification. Skull: Normal. Negative for fracture or focal lesion. Other: None. CT MAXILLOFACIAL FINDINGS Osseous: Mildly displaced type 2 odontoid fracture of C2 is noted. The mandible is unremarkable. No other bony abnormality is noted in maxillofacial region. Orbits: Negative. No traumatic or inflammatory finding. Sinuses: Clear. Soft tissues: Negative. IMPRESSION: Mild diffuse cortical atrophy. Mild chronic ischemic white matter disease. No acute intracranial abnormality seen. Mildly displaced type 2 odontoid fracture of C2 is noted. Critical Value/emergent results were called by telephone at the time of interpretation on 03/27/2018 at 9:29 am to Dr. Kris Mouton, who verbally acknowledged these results. No definite abnormality seen in maxillofacial region. Electronically Signed   By: Marijo Conception, M.D.   On: 03/27/2018 09:30    Orson Eva, DO  [facility] Pager [phone]  If 7PM-7AM, please contact  night-coverage [email or web address] Password [email or web address] 04/17/2018, 5:05 PM   LOS: 1 day

## 2018-04-17 NOTE — Progress Notes (Signed)
Pt aggressive this morning when attempting to obtain vital signs and check to see if under chuck was clean. Pt using curse words. Attempted to reorient pt that were not harming him. Bed alarm active. Will continue to monitor.

## 2018-04-18 DIAGNOSIS — E43 Unspecified severe protein-calorie malnutrition: Secondary | ICD-10-CM

## 2018-04-18 LAB — BASIC METABOLIC PANEL
BUN: 31 mg/dL — ABNORMAL HIGH (ref 8–23)
CO2: 21 mmol/L — ABNORMAL LOW (ref 22–32)
Calcium: 8.4 mg/dL — ABNORMAL LOW (ref 8.9–10.3)
Chloride: 115 mmol/L — ABNORMAL HIGH (ref 98–111)
Creatinine, Ser: 1.74 mg/dL — ABNORMAL HIGH (ref 0.61–1.24)
GFR calc non Af Amer: 33 mL/min — ABNORMAL LOW (ref >60)
GFR, EST AFRICAN AMERICAN: 38 mL/min — AB (ref >60)
Glucose, Bld: 106 mg/dL — ABNORMAL HIGH (ref 70–99)
Potassium: 4 mmol/L (ref 3.5–5.1)
SODIUM: 138 mmol/L (ref 135–145)

## 2018-04-18 MED ORDER — SENNA 8.6 MG PO TABS
1.0000 | ORAL_TABLET | Freq: Once | ORAL | Status: AC
  Administered 2018-04-18: 8.6 mg via ORAL
  Filled 2018-04-18: qty 1

## 2018-04-18 MED ORDER — PREDNISONE 20 MG PO TABS
50.0000 mg | ORAL_TABLET | Freq: Every day | ORAL | Status: DC
  Administered 2018-04-18 – 2018-04-19 (×2): 50 mg via ORAL
  Filled 2018-04-18 (×2): qty 2

## 2018-04-18 MED ORDER — ENSURE ENLIVE PO LIQD
237.0000 mL | Freq: Two times a day (BID) | ORAL | Status: DC
  Administered 2018-04-18 – 2018-04-19 (×2): 237 mL via ORAL

## 2018-04-18 NOTE — Clinical Social Work Placement (Signed)
   CLINICAL SOCIAL WORK PLACEMENT  NOTE  Date:  04/18/2018  Patient Details  Name: [name] MRN: [id number] Date of Birth: 1921-05-06  Clinical Social Work is seeking post-discharge placement for this patient at the [place] level of care (*CSW will initial, date and re-position this form in  chart as items are completed):  Yes   Patient/family provided with [place] Work Department's list of facilities offering this level of care within the geographic area requested by the patient (or if unable, by the patient's family).  Yes   Patient/family informed of their freedom to choose among providers that offer the needed level of care, that participate in Medicare, Medicaid or managed care program needed by the patient, have an available bed and are willing to accept the patient.  Yes   Patient/family informed of [facility]'s ownership interest in [facility] and [facility], as well as of the fact that they are under no obligation to receive care at these facilities.  PASRR submitted to EDS on 04/14/18     PASRR number received on 04/15/18     Existing PASRR number confirmed on       FL2 transmitted to all facilities in geographic area requested by pt/family on 04/15/18     FL2 transmitted to all facilities within larger geographic area on       Patient informed that his/her managed care company has contracts with or will negotiate with certain facilities, including the following:            Patient/family informed of bed offers received.  Patient chooses bed at [facility]     Physician recommends and patient chooses bed at      Patient to be transferred to   on  .  Patient to be transferred to facility by       Patient family notified on   of transfer.  Name of family member notified:        PHYSICIAN       Additional Comment:  Bed offer from [facility].  Informed patient and family.  [facility] is working on  authorization.   _______________________________________________ Trish Mage, LCSW 04/18/2018, 12:37 PM

## 2018-04-18 NOTE — Progress Notes (Signed)
Patient has remained pleasant and oriented to self and place throughout shift and has made no attempts to exit bed or pull at lines. Patient agreeable to repositioning and nursing interventions. No signs of aggression or irritability.

## 2018-04-18 NOTE — Progress Notes (Signed)
Initial Nutrition Assessment  DOCUMENTATION CODES:   Severe malnutrition in context of chronic illness, Underweight  INTERVENTION:  Recommend ST to screen for swallow problem and recommend appropriate textures, assess feeding ability- patient may also benefit from OT screen if congruent with progression of care goals  Start Ensure Enlive BID to support repletion of nutrients  MVI daily   RD will continue to follow results of palliative meeting and goals of care going forward   NUTRITION DIAGNOSIS:   Severe Malnutrition related to (acute decline (FTT) in the setting of multiple chronic illnesses) as evidenced by (Poor oral intake-dehydration, constipation -acute metabolic encephalapathy. -altered mental status) -severe fat and muscle loss.   GOAL:  Patient will meet greater than or equal to 90% of their needs  MONITOR:   PO intake, Skin, Supplement acceptance(Care goals-) REASON FOR ASSESSMENT:   Consult Assessment of nutrition requirement/status  ASSESSMENT:  Patient is a 85 yo male with hx of Parkinson's disease, COPD, HTN, CAD, Hiatal hernia, CKD III and lung cancer. He presents with altered mental status and reported decline since his fall in Mid-December. Present-Acute metabolic encephalopathy, failure to thrive as well as dehydration and fecal impaction on admission.   A daughter and wife is at bedside. His spouse is heard of hearing. Daughter and pt provided history.  Nutrition intake- poor oral intake with recent change in mental status.  Patient usually consumes only 2 meals daily breakfast and lunch only. His son helps with care, shops for and prepares household food.  Daughter brought in breakfast this morning from local restaurant he at [employer]% baked apples, a few bites of ham and biscuit which is a minimal amount of calories and very low protein.   Patient has been taking a daily MVI and only occasionally drinks an Ensure (chocolate).   Acute weight loss 4% in < 1  month which is significant. Patient is underweight for age (BMI <23).   Patient had been ambulateing with a cane or walker up until his recent fall and decline. Physical exam findings: moderate to severe muscle / fat depletions, poor oral intake and unplanned wt loss- meet criteria for severe malnutrition.   Medications reviewed and include: sinemet, lopressor, lipitor, MVI, Protonix, Miralax and senna.  Labs: BMP Latest Ref Rng & Units 04/18/2018 04/17/2018 04/16/2018  Glucose 70 - 99 mg/dL 106(H) 97 94  BUN 8 - 23 mg/dL 31(H) 33(H) 35(H)  Creatinine 0.61 - 1.24 mg/dL 1.74(H) 1.75(H) 1.82(H)  BUN/Creat Ratio 10 - 24 - - -  Sodium 135 - 145 mmol/L 138 139 139  Potassium 3.5 - 5.1 mmol/L 4.0 3.8 3.8  Chloride 98 - 111 mmol/L 115(H) 111 111  CO2 22 - 32 mmol/L 21(L) 22 22  Calcium 8.9 - 10.3 mg/dL 8.4(L) 8.6(L) 8.5(L)     NUTRITION - FOCUSED PHYSICAL EXAM:    Most Recent Value  Orbital Region  Severe depletion  Upper Arm Region  Moderate depletion  Thoracic and Lumbar Region  Severe depletion  Temple Region  Mild depletion  Clavicle Bone Region  Severe depletion  Clavicle and Acromion Bone Region  Moderate depletion  Scapular Bone Region  Unable to assess  Dorsal Hand  Severe depletion  Patellar Region  Severe depletion  Edema (RD Assessment)  Moderate [right foot]  Hair  Reviewed  Eyes  Unable to assess  Mouth  Unable to assess  Skin  Reviewed [bilateral dryness lower extremities]      Diet Order:   Diet Order  Diet regular Room service appropriate? Yes; Fluid consistency: Thin  Diet effective now              EDUCATION NEEDS:   Skin:  Skin Assessment: Reviewed RN Assessment  Last BM:  type 7 on 04/16/18 agressive bowel regimen in place   Height:   Ht Readings from Last 1 Encounters:  04/14/18 5\' 7"  (1.702 m)    Weight:   Wt Readings from Last 1 Encounters:  04/14/18 55.3 kg    Ideal Body Weight:  67 kg  BMI:  Body mass index is 19.11  kg/m.  Estimated Nutritional Needs:   Kcal:  1650-1925 (30-35 kcal/kg/bw)  Protein:  44-50 gr (0.8-0.9 gr/kg/bw)  Fluid:  >1500 ml daily  Colman Cater MS,RD,CSG,LDN Office: [phone] Pager: [phone]

## 2018-04-18 NOTE — Progress Notes (Signed)
Discussed with patient that it would be good for him to get up to sit in the chair. Pt states that his feet feel like pins and needles and hurt to be touched. Upon assessment, feet are warm to the touch with good pulses. Right foot has 2+ pitting edema. Pt can move both feet but states that they burn when touched. According to family, this problem started over the weekend. Pt declines to walk at this time due to pain. Notified Dr. Carles Collet.

## 2018-04-18 NOTE — Progress Notes (Signed)
PROGRESS NOTE  KHY PITRE TGY:[id number] DOB: 09-21-1921 DOA: 04/14/2018 PCP: Kathyrn Drown, MD  Brief History: 85 y.o.malewith medical history ofcoronary artery disease, COPD, Parkinson's disease, chronic diastolic CHF, horseshoe kidney, hypertension, hyperlipidemia, squamous cell lung cancer, and cognitive impairment presenting with altered mental status. The patient has been having mechanical falls for the last several months. The patient had a fall approximately 2 weeks prior to this admission. The patient's son brought the patient to the emergency department on 03/27/2018. Work-up at that time revealed that the patient had an odontoid fracture. Neurosurgery was consulted at that time and felt the patient would benefit from nonoperative management. The patient was placed in an Aspen collar and discharged home.   Since his fall 2 weeks prior to this admission, the patient's son states that the patient has had a cognitive decline and functional decline worse than his baseline. He has been having more confusion.Son states that he has been more restless at home walking from room to room requiring more reorientation;however,further history from the son reveals that the patient has had a gradual cognitive and functional decline over the past year. The patient's son states that he has been having syncopal episodes, at least 5 since his ED visit. However further history suggest that these may be mechanical falls or may be due to gait instability. The patient's son states that the patient is usually falling when repositioning his walker, and the patient falls back onto the sons hands during which time the son feels like the patient may have been "out" for only a few seconds. However, the patient remains conversant and answers appropriately. There is no bowel bladder incontinence or tongue biting.  Assessment/Plan: Acute metabolic encephalopathy -Likely multifactorial  including recent use of opioids, constipation, urinary retention, and dehydration -Obtain urinalysis and urine culture--not suggestive of UTI -Serum K87--6811 -Folic XBWI--20.3 -TDH--7.416 -Ammonia--9 -The patient likely has continued progression of his underlying cognitive impairment -overall mental status has improved, but he remains pleasantly confused with intermittent agitation  Questionable syncopal episodes/orthostatic hypotension -Clinical history does not suggest true syncope -echo--EF60-65%, G2 DD, trivial AI, mild MR -Orthostatic vital signs--positive -EEG--negative for epileptiform discharges -The patient has a component of dysautonomia from his Parkinson's disease -Patient was started on IV fluids and fluid resuscitated  Failure to thrive -Dietitian consult -Liberalize diet -IV fluids -Work-up as above -Palliative medicine consultation for goals of care-->DNR  Fecal impaction -IncreasedMiraLAX bid -soap suds enema>>>no success -asked RN to assist withDisimpaction>>no success -The patient continues to have fecal impaction and constipation. Despite aggressive measures including MiraLAX, stimulants, and soapsuds enema, he continues to retain stool at this time -try SMOG enema--> numerous bowel movements -Continue maintenance bowel regimen including MiraLAX and Senokot on a daily basis  Acute gouty arthritis -Start prednisone burst 04/18/2018  CKD stage III -Baseline creatinine 1.8 -2.1 -A.m. BMP  COPD -Stable on room air -Continueduo nebs -Continue Symbicort  Urinary retention/Hydronephrosis -secondary to the patient's stool impaction and severe constipation -Foley catheter placed1/05/2018 -Plan for voiding trial once the patient's constipation is improved -Continue tamsulosin -renal US post foley to assess hydronephrosis--improved left-sided hydronephrosis, unchanged mild right hydronephrosis  Coronary artery disease -Stable presently  without any chest pain -EKG without concerning ischemic changes -Continue aspirin, Lipitor, metoprolol  Parkinson's disease -Was [facility] by neurology at [facility] -continue Sinemet  Goals of care -Palliative medicine consulted -Patient is now DNR -The patient has exhibited a functional decline from his premorbid state. He  is not safe to return home and his premorbid living condition. He will require physical therapy/Occupational Therapy to improve his functional status and hopes to return to his premorbid functional state.  Severe malnutrition -Start dietary supplements  Disposition Plan:SNF1/7 if insurance gives authorization Family Communication:Family at bedsideupdated 1/6  Consultants:none  Code Status: FULL   DVT Prophylaxis: [place] Heparin    Procedures: As Listed in Progress Note Above  Antibiotics: None      Subjective: Patient complains of right foot pain greater than left great foot pain.  It started in the last 24 to 48 hours.  He denies any recent injury or trauma.  He denies any fever, chills, headache, chest pain, shortness of breath, nausea, vomiting, diarrhea, abdominal pain.  There is no dysuria hematuria.  Objective: Vitals:   04/18/18 0804 04/18/18 0813 04/18/18 0900 04/18/18 1509  BP:   (!) 121/57 (!) 166/70  Pulse:   75 85  Resp:    19  Temp:    98.4 F (36.9 C)  TempSrc:    Oral  SpO2: 92% 95%  95%  Weight:      Height:        Intake/Output Summary (Last 24 hours) at 04/18/2018 1800 Last data filed at 04/18/2018 1500 Gross per 24 hour  Intake 480 ml  Output 1050 ml  Net -570 ml   Weight change:  Exam:   General:  Pt is alert, follows commands appropriately, not in acute distress  HEENT: No icterus, No thrush, No neck mass, [place]/AT  Cardiovascular: RRR, S1/S2, no rubs, no gallops  Respiratory: Bibasilar crackles but no wheezing.  Good air movement.  Abdomen: Soft/+BS, non tender, non distended, no  guarding  Extremities: Trace edema of right ankle and foot without any crepitance, No lymphangitis, No petechiae, No rashes, no synovitis   Data Reviewed: I have personally reviewed following labs and imaging studies Basic Metabolic Panel: Recent Labs  Lab 04/14/18 0832 04/15/18 0719 04/16/18 0621 04/17/18 0640 04/18/18 0542  NA 136 140 139 139 138  K 4.1 4.0 3.8 3.8 4.0  CL 106 111 111 111 115*  CO2 23 23 22 22  21*  GLUCOSE 108* 98 94 97 106*  BUN 43* 41* 35* 33* 31*  CREATININE 1.85* 1.92* 1.82* 1.75* 1.74*  CALCIUM 8.8* 8.6* 8.5* 8.6* 8.4*   Liver Function Tests: Recent Labs  Lab 04/14/18 0832  AST 20  ALT 5  ALKPHOS 81  BILITOT 0.5  PROT 6.2*  ALBUMIN 3.3*   No results for input(s): LIPASE, AMYLASE in the last 168 hours. Recent Labs  Lab 04/14/18 1357  AMMONIA 9   Coagulation Profile: No results for input(s): INR, PROTIME in the last 168 hours. CBC: Recent Labs  Lab 04/14/18 0832 04/15/18 0719 04/16/18 0621 04/17/18 0640  WBC 9.1 10.1 10.4 11.2*  NEUTROABS 7.0  --   --   --   HGB 9.4* 8.4* 7.9* 8.3*  HCT 30.1* 27.2* 26.0* 27.3*  MCV 98.0 96.8 98.1 98.9  PLT 290 252 238 241   Cardiac Enzymes: Recent Labs  Lab 04/14/18 0832  TROPONINI <0.03   BNP: Invalid input(s): POCBNP CBG: No results for input(s): GLUCAP in the last 168 hours. HbA1C: No results for input(s): HGBA1C in the last 72 hours. Urine analysis:    Component Value Date/Time   COLORURINE YELLOW 04/14/2018 1450   APPEARANCEUR CLEAR 04/14/2018 1450   LABSPEC 1.021 04/14/2018 1450   PHURINE 5.0 04/14/2018 1450   GLUCOSEU NEGATIVE 04/14/2018 1450   HGBUR  MODERATE (A) 04/14/2018 1450   BILIRUBINUR NEGATIVE 04/14/2018 1450   KETONESUR NEGATIVE 04/14/2018 1450   PROTEINUR NEGATIVE 04/14/2018 1450   UROBILINOGEN 0.2 10/21/2013 1655   NITRITE NEGATIVE 04/14/2018 1450   LEUKOCYTESUR NEGATIVE 04/14/2018 1450   Sepsis Labs: @LABRCNTIP (procalcitonin:4,lacticidven:4) ) Recent  Results (from the past 240 hour(s))  Culture, Urine     Status: None   Collection Time: 04/14/18  2:50 PM  Result Value Ref Range Status   Specimen Description   Final    URINE, CATHETERIZED Performed at [facility], [address], [place], [place] [address]    Special Requests   Final    NONE Performed at [facility], [address] [place], [place] [address]    Culture   Final    NO GROWTH Performed at [facility], [place] [address] [place], [place] [address]    Report Status 04/15/2018 FINAL  Final     Scheduled Meds: . aspirin EC  81 mg Oral Daily  . atorvastatin  40 mg Oral q1800  . carbidopa-levodopa  3 tablet Oral TID  . feeding supplement (ENSURE ENLIVE)  237 mL Oral BID BM  . guaiFENesin  600 mg Oral BID  . heparin  5,000 Units Subcutaneous Q8H  . ipratropium-albuterol  3 mL Nebulization BID  . metoprolol tartrate  25 mg Oral BID  . mometasone-formoterol  2 puff Inhalation BID  . multivitamin with minerals  1 tablet Oral Daily  . pantoprazole  40 mg Oral Daily  . polyethylene glycol  17 g Oral BID  . predniSONE  50 mg Oral Q breakfast  . senna  2 tablet Oral QHS  . tamsulosin  0.4 mg Oral QHS   Continuous Infusions: . sodium chloride 75 mL/hr at 04/18/18 0200    Procedures/Studies: Dg Chest 2 View  Result Date: 04/14/2018 CLINICAL DATA:  Weakness and syncope. Fell today. History of lung cancer. EXAM: CHEST - 2 VIEW COMPARISON:  Multiple prior chest x-rays from 2019 and 2018. FINDINGS: The cardiac silhouette, mediastinal and hilar contours are within normal limits and stable. Moderate tortuosity and calcification of the thoracic aorta is again demonstrated. Prior bypass surgery. Chronic left apical scarring changes and probable radiation changes. Underlying emphysema. No acute overlying pulmonary findings. Chronic right basilar scarring changes and pleural thickening. The bony thorax is intact. IMPRESSION: Chronic emphysematous changes, pulmonary scarring and  left upper lobe scarring and radiation. No acute overlying pulmonary process. Electronically Signed   By: Marijo Sanes M.D.   On: 04/14/2018 08:10   Dg Cervical Spine Complete  Result Date: 03/27/2018 CLINICAL DATA:  Blacked out.  Fall. EXAM: CERVICAL SPINE - COMPLETE 4+ VIEW COMPARISON:  CT scan March 10, 2011 FINDINGS: Dense calcifications in the neck or consistent with carotid calcifications. Minimal anterolisthesis of C2 versus C3 is unchanged and likely degenerative. Minimal anterolisthesis of C7 versus T1 is stable as well. No other malalignment identified. No fractures are seen. Multilevel degenerative disc disease. The neural foramina are patent. Chronic pleuroparenchymal thickening in the left apex is stable since at least December 2018. The superior most sternal wire is fractured, also stable. A dedicated odontoid view is limited. IMPRESSION: 1. This study is significantly limited due to positioning and osteopenia. No definitive fracture or traumatic malalignment. However, if there is clinical concern for cervical spine fracture, a CT scan is recommended. 2. Dense carotid calcifications. 3. Chronic changes in the left apex of the lung. Electronically Signed   By: Dorise Bullion III M.D  On: 03/27/2018 09:16   Ct Head Wo Contrast  Result Date: 04/14/2018 CLINICAL DATA:  Tremors and altered mental status EXAM: CT HEAD WITHOUT CONTRAST TECHNIQUE: Contiguous axial images were obtained from the base of the skull through the vertex without intravenous contrast. COMPARISON:  March 27, 2018 FINDINGS: Brain: Mild diffuse atrophy is stable. There is no intracranial mass, hemorrhage, extra-axial fluid collection, or midline shift. There is patchy small vessel disease in the centra semiovale bilaterally. There is an apparent old infarct in the peripheral left temporal-occipital region, stable. No acute infarct is appreciable. Vascular: There is no evident vascular calcification. There is calcification  in each distal vertebral artery and carotid siphon region. Skull: Bony calvarium appears intact. Sinuses/Orbits: There is mucosal thickening in multiple ethmoid air cells bilaterally. Other visualized paranasal sinuses are clear. Orbits appear symmetric bilaterally. Other: Mastoid air cells are clear. IMPRESSION: Atrophy with supratentorial small vessel disease. Prior left temporal-occipital infarct. No acute infarct. No mass or hemorrhage. There are foci of arterial vascular calcification. There is mucosal thickening in several ethmoid air cells. Electronically Signed   By: Lowella Grip III M.D.   On: 04/14/2018 10:30   Ct Head Wo Contrast  Result Date: 03/27/2018 CLINICAL DATA:  Fall after loss of consciousness.  Facial trauma. EXAM: CT HEAD WITHOUT CONTRAST CT MAXILLOFACIAL WITHOUT CONTRAST TECHNIQUE: Multidetector CT imaging of the head and maxillofacial structures were performed using the standard protocol without intravenous contrast. Multiplanar CT image reconstructions of the maxillofacial structures were also generated. COMPARISON:  CT scan of July 28, 2016. FINDINGS: CT HEAD FINDINGS Brain: Mild diffuse cortical atrophy is noted. Mild chronic ischemic white matter disease is noted. No mass effect or midline shift is noted. Ventricular size is within normal limits. There is no evidence of mass lesion, hemorrhage or acute infarction. Vascular: No hyperdense vessel or unexpected calcification. Skull: Normal. Negative for fracture or focal lesion. Other: None. CT MAXILLOFACIAL FINDINGS Osseous: Mildly displaced type 2 odontoid fracture of C2 is noted. The mandible is unremarkable. No other bony abnormality is noted in maxillofacial region. Orbits: Negative. No traumatic or inflammatory finding. Sinuses: Clear. Soft tissues: Negative. IMPRESSION: Mild diffuse cortical atrophy. Mild chronic ischemic white matter disease. No acute intracranial abnormality seen. Mildly displaced type 2 odontoid fracture  of C2 is noted. Critical Value/emergent results were called by telephone at the time of interpretation on 03/27/2018 at 9:29 am to Dr. Kris Mouton, who verbally acknowledged these results. No definite abnormality seen in maxillofacial region. Electronically Signed   By: Marijo Conception, M.D.   On: 03/27/2018 09:30   Ct Cervical Spine Wo Contrast  Result Date: 03/27/2018 CLINICAL DATA:  Was walking today and suffered a loss of consciousness, fall, multiple lacerations to face; history lung cancer, hypertension, coronary artery disease, former smoker; abnormal CT facial bones EXAM: CT CERVICAL SPINE WITHOUT CONTRAST TECHNIQUE: Multidetector CT imaging of the cervical spine was performed without intravenous contrast. Multiplanar CT image reconstructions were also generated. COMPARISON:  03/10/2011 CT cervical spine, chest radiograph 11/22/2017 FINDINGS: Alignment: Mild anterolisthesis at C7-T1, unchanged. Remaining alignments normal Skull base and vertebrae: Diffuse osseous demineralization. Visualized skull base intact. Nondisplaced type II odontoid fracture, minimally oblique anterior to posterior. Prominent calcified pannus posterior to the odontoid process. Vertebral body heights maintained. No additional fracture, subluxation or bone destruction. Multilevel disc space narrowing and endplate spur formation. Multilevel facet degenerative changes bilaterally. Soft tissues and spinal canal: Prevertebral soft tissues normal thickness. Dense atherosclerotic calcifications aorta. Disc levels:  No additional abnormalities Upper  chest: RIGHT lung apex clear. Opacified LEFT lung apex, corresponding to pleural fluid/thickening on prior chest radiographs, question sequela of treatment of lung cancer. Other: N/A IMPRESSION: Nondisplaced minimally oblique type II odontoid fracture. Osseous demineralization with multilevel degenerative disc and facet disease changes of the cervical spine. Pleural scarring and effusion at LEFT  apex unchanged since prior chest radiograph, question sequela of treatment for lung cancer. Electronically Signed   By: Lavonia Dana M.D.   On: 03/27/2018 10:27   Ct Abdomen Pelvis W Contrast  Result Date: 04/14/2018 CLINICAL DATA:  Abdominal pain, acute, generalized. EXAM: CT ABDOMEN AND PELVIS WITH CONTRAST TECHNIQUE: Multidetector CT imaging of the abdomen and pelvis was performed using the standard protocol following bolus administration of intravenous contrast. CONTRAST:  59mL OMNIPAQUE IOHEXOL 300 MG/ML  SOLN COMPARISON:  10/29/2017 FINDINGS: Lower chest: Moderate sliding hiatal hernia. Extensive coronary calcification. Scarring in the right lower lobe with volume loss and mild bronchiectasis. Mild centrilobular emphysema. Hepatobiliary: No focal liver abnormality.No evidence of biliary obstruction or stone. Pancreas: Unremarkable. Spleen: Unremarkable. Adrenals/Urinary Tract: Negative adrenals. Horseshoe kidney. Bilateral hydroureteronephrosis likely from the very distended bladder which is likely patulous at baseline due to the partially folded appearance. Stomach/Bowel: Formed stool throughout much of the colon, most notably the rectum which is distended to 10 cm and fills the pelvis. No evident bowel inflammation. The appendix is difficult to discretely visualize. Vascular/Lymphatic: Extensive atherosclerotic calcification with high-grade bilateral common femoral and superficial femoral stenosis. No mass or adenopathy. Reproductive:Negative Other: No ascites or pneumoperitoneum. Musculoskeletal: Advanced spinal degeneration. No acute osseous finding. IMPRESSION: 1. Constipation with rectal impaction and distention to 10 cm. 2. Dilated bladder, possibly related to#1, with bilateral hydronephrosis of the horseshoe kidneys. 3. Chronic findings are described above. Electronically Signed   By: Monte Fantasia M.D.   On: 04/14/2018 10:30   US Renal  Result Date: 04/15/2018 CLINICAL DATA:  Hydronephrosis.  EXAM: RENAL / URINARY TRACT ULTRASOUND COMPLETE COMPARISON:  CT abdomen pelvis from yesterday. FINDINGS: Right Kidney: Renal measurements: 10.4 x 4.4 x 4.6 cm = volume: 108 mL . Echogenicity within normal limits. Mild right hydronephrosis is unchanged. No mass visualized. Left Kidney: Renal measurements: 10.6 x 4.2 x 3.7 cm = volume: 86 mL. Echogenicity within normal limits. No mass or hydronephrosis visualized. Bladder: Decompressed by Foley catheter. IMPRESSION: 1. Unchanged mild right hydronephrosis. Resolved left hydronephrosis. Electronically Signed   By: Titus Dubin M.D.   On: 04/15/2018 12:22   Ct Maxillofacial Wo Cm  Result Date: 03/27/2018 CLINICAL DATA:  Fall after loss of consciousness.  Facial trauma. EXAM: CT HEAD WITHOUT CONTRAST CT MAXILLOFACIAL WITHOUT CONTRAST TECHNIQUE: Multidetector CT imaging of the head and maxillofacial structures were performed using the standard protocol without intravenous contrast. Multiplanar CT image reconstructions of the maxillofacial structures were also generated. COMPARISON:  CT scan of July 28, 2016. FINDINGS: CT HEAD FINDINGS Brain: Mild diffuse cortical atrophy is noted. Mild chronic ischemic white matter disease is noted. No mass effect or midline shift is noted. Ventricular size is within normal limits. There is no evidence of mass lesion, hemorrhage or acute infarction. Vascular: No hyperdense vessel or unexpected calcification. Skull: Normal. Negative for fracture or focal lesion. Other: None. CT MAXILLOFACIAL FINDINGS Osseous: Mildly displaced type 2 odontoid fracture of C2 is noted. The mandible is unremarkable. No other bony abnormality is noted in maxillofacial region. Orbits: Negative. No traumatic or inflammatory finding. Sinuses: Clear. Soft tissues: Negative. IMPRESSION: Mild diffuse cortical atrophy. Mild chronic ischemic white matter disease. No  acute intracranial abnormality seen. Mildly displaced type 2 odontoid fracture of C2 is noted.  Critical Value/emergent results were called by telephone at the time of interpretation on 03/27/2018 at 9:29 am to Dr. Kris Mouton, who verbally acknowledged these results. No definite abnormality seen in maxillofacial region. Electronically Signed   By: Marijo Conception, M.D.   On: 03/27/2018 09:30    Orson Eva, DO  [facility] Pager [phone]  If 7PM-7AM, please contact night-coverage [email or web address] Password [email or web address] 04/18/2018, 6:00 PM   LOS: 2 days

## 2018-04-19 ENCOUNTER — Inpatient Hospital Stay
Admission: RE | Admit: 2018-04-19 | Discharge: 2018-05-26 | Disposition: A | Discharge status: Home / Self Care | Payer: Medicare Other | Source: Ambulatory Visit | Attending: Internal Medicine | Admitting: Internal Medicine

## 2018-04-19 DIAGNOSIS — G20 Parkinson's disease: Secondary | ICD-10-CM

## 2018-04-19 DIAGNOSIS — J449 Chronic obstructive pulmonary disease, unspecified: Secondary | ICD-10-CM

## 2018-04-19 DIAGNOSIS — Z9181 History of falling: Secondary | ICD-10-CM | POA: Diagnosis not present

## 2018-04-19 DIAGNOSIS — S12191D Other nondisplaced fracture of second cervical vertebra, subsequent encounter for fracture with routine healing: Secondary | ICD-10-CM

## 2018-04-19 MED ORDER — PREDNISONE 50 MG PO TABS: 50.0000 mg | ORAL_TABLET | Freq: Every day | ORAL | 0 refills | Status: DC

## 2018-04-19 MED ORDER — ENSURE ENLIVE PO LIQD: 237.0000 mL | Freq: Two times a day (BID) | ORAL | 12 refills | Status: DC

## 2018-04-19 MED ORDER — POLYETHYLENE GLYCOL 3350 17 G PO PACK: 17.0000 g | PACK | Freq: Two times a day (BID) | ORAL | 0 refills | Status: DC

## 2018-04-19 NOTE — Progress Notes (Signed)
Report called to Inez Catalina at the [facility].  Pt transported via wheelchair with transporters x 2 at chair side.  Pt's belongings taken by family.  [id number]

## 2018-04-19 NOTE — Clinical Social Work Placement (Signed)
   CLINICAL SOCIAL WORK PLACEMENT  NOTE  Date:  04/19/2018  Patient Details  Name: [name] MRN: [id number] Date of Birth: [birth year]  Clinical Social Work is seeking post-discharge placement for this patient at the [place] level of care (*CSW will initial, date and re-position this form in  chart as items are completed):  Yes   Patient/family provided with [facility] Work Department's list of facilities offering this level of care within the geographic area requested by the patient (or if unable, by the patient's family).  Yes   Patient/family informed of their freedom to choose among providers that offer the needed level of care, that participate in Medicare, Medicaid or managed care program needed by the patient, have an available bed and are willing to accept the patient.  Yes   Patient/family informed of [facility]'s ownership interest in [facility] and [facility], as well as of the fact that they are under no obligation to receive care at these facilities.  PASRR submitted to EDS on 04/14/18     PASRR number received on 04/15/18     Existing PASRR number confirmed on       FL2 transmitted to all facilities in geographic area requested by pt/family on 04/15/18     FL2 transmitted to all facilities within larger geographic area on       Patient informed that his/her managed care company has contracts with or will negotiate with certain facilities, including the following:            Patient/family informed of bed offers received.  Patient chooses bed at [facility]     Physician recommends and patient chooses bed at      Patient to be transferred to [facility] on 04/19/18.  Patient to be transferred to facility by tunnel     Patient family notified on 04/19/18 of transfer.  Name of family member notified:  Bunny Overby     PHYSICIAN       Additional Comment: Pt to be transferred to [facility] via tunnel  today.  Sister is signing patient in.   _______________________________________________ Trish Mage, LCSW 04/19/2018, 3:34 PM

## 2018-04-19 NOTE — Care Management Note (Signed)
Case Management Note  Patient Details  Name: [name] MRN: [id number] Date of Birth: 1921-10-20  If discussed at Long Length of Stay Meetings, dates discussed:  04/19/2018  Additional Comments:  Sherald Barge, RN 04/19/2018, 12:51 PM

## 2018-04-19 NOTE — Progress Notes (Signed)
Physical Therapy Treatment Patient Details Name: [name] MRN: [id number] DOB: Apr 25, 1921 Today's Date: 04/19/2018    History of Present Illness [name] is a 85 y.o. male with medical history of coronary artery disease, COPD, Parkinson's disease, chronic diastolic CHF, horseshoe kidney, hypertension, hyperlipidemia, squamous cell lung cancer, and cognitive impairment presenting with altered mental status.  The patient has been having mechanical falls for the last several months.  The patient had a fall approximately 2 weeks prior to this admission.  The patient's son brought the patient to the emergency department on 03/27/2018.  Work-up at that time revealed that the patient had an odontoid fracture.  Neurosurgery was consulted at that time and felt the patient would benefit from nonoperative management.  The patient was placed in an Aspen collar and discharged home.  Since that time, the patient has rarely used hydrocodone for pain.  In fact, the patient is only taking 2 x 1/2 tablets of hydrocodone.  There have been no other new medications.  Since his fall 2 weeks prior to this admission, the patient's son states that the patient has had a cognitive decline and functional decline worse than his baseline.  He has been having more confusion and wandering.  However further history from the son reveals that the patient has had a gradual cognitive and functional decline over the past year.  The patient's son states that he has been having syncopal episodes, at least 5 since his ED visit.  However further history suggest that these may be mechanical falls or may be due to gait instability.  The patient's son states that the patient is usually falling when repositioning his walker, and the patient falls back onto the sons hands during which time the son feels like the patient may have been "out" for only a few seconds.  However, the patient remains conversant and answers appropriately.  There is no bowel  bladder incontinence or tongue biting.    PT Comments    Patient presents significantly weaker and requires more assistance for sit to stands, transfers and ambulation.  Patient very shaky and tends to lean backwards when taking steps with RW, limited to ambulation to bathroom, able to transfer to commode and have a BM, required much verbal/tactile cueing to make it back to bedside and tolerated sitting up at bedside with family members present and supervising - RN notified.  Patient will benefit from continued physical therapy in hospital and recommended venue below to increase strength, balance, endurance for safe ADLs and gait.   Follow Up Recommendations  SNF     Equipment Recommendations  None recommended by PT    Recommendations for Other Services       Precautions / Restrictions Precautions Precautions: Fall Restrictions Weight Bearing Restrictions: No    Mobility  Bed Mobility Overal bed mobility: Needs Assistance Bed Mobility: Supine to Sit     Supine to sit: Min assist     General bed mobility comments: slow labored movement, very shaky  Transfers Overall transfer level: Needs assistance Equipment used: Rolling walker (2 wheeled) Transfers: Sit to/from [facility] Sit to Stand: Mod assist Stand pivot transfers: Mod assist       General transfer comment: unsteady and shaky on feet  Ambulation/Gait Ambulation/Gait assistance: Mod assist Gait Distance (Feet): 15 Feet Assistive device: Rolling walker (2 wheeled) Gait Pattern/deviations: Decreased step length - right;Decreased step length - left;Decreased stride length Gait velocity: slow   General Gait Details: limited to ambulation to  bathroom due to poor standing balance, shaky unsteady labored cadence with frequent near loss of balance prevented by therapist   Stairs             Wheelchair Mobility    Modified Rankin (Stroke Patients Only)       Balance Overall balance  assessment: Needs assistance Sitting-balance support: Feet supported;No upper extremity supported Sitting balance-Leahy Scale: Good     Standing balance support: Bilateral upper extremity supported;During functional activity Standing balance-Leahy Scale: Poor Standing balance comment: fair/poor using RW                            Cognition Arousal/Alertness: Awake/alert Behavior During Therapy: WFL for tasks assessed/performed Overall Cognitive Status: Within Functional Limits for tasks assessed                                        Exercises General Exercises - Lower Extremity Long Arc Quad: Seated;AROM;Strengthening;Both;10 reps Hip Flexion/Marching: Seated;Strengthening;AROM;Both;10 reps Toe Raises: Seated;AROM;Strengthening;Both Heel Raises: Seated;AROM;Strengthening;Both;10 reps    General Comments        Pertinent Vitals/Pain Pain Assessment: No/denies pain    Home Living                      Prior Function            PT Goals (current goals can now be found in the care plan section) Acute Rehab PT Goals Patient Stated Goal: return home after rehab PT Goal Formulation: With patient/family Time For Goal Achievement: 04/28/18 Potential to Achieve Goals: Good Progress towards PT goals: Progressing toward goals    Frequency    Min 3X/week      PT Plan Current plan remains appropriate    Co-evaluation              AM-PAC PT "6 Clicks" Mobility   Outcome Measure  Help needed turning from your back to your side while in a flat bed without using bedrails?: Total Help needed moving from lying on your back to sitting on the side of a flat bed without using bedrails?: A Lot Help needed moving to and from a bed to a chair (including a wheelchair)?: A Lot Help needed standing up from a chair using your arms (e.g., wheelchair or bedside chair)?: A Lot Help needed to walk in hospital room?: A Lot Help needed climbing  3-5 steps with a railing? : Total 6 Click Score: 10    End of Session Equipment Utilized During Treatment: Gait belt;Cervical collar Activity Tolerance: Patient tolerated treatment well;Patient limited by fatigue Patient left: in bed;with call bell/phone within reach;with family/visitor present(seated at bedside with family members present) Nurse Communication: Mobility status PT Visit Diagnosis: Unsteadiness on feet (R26.81);Other abnormalities of gait and mobility (R26.89);Muscle weakness (generalized) (M62.81)     Time: 1610-9604 PT Time Calculation (min) (ACUTE ONLY): 36 min  Charges:  $Therapeutic Exercise: 8-22 mins $Therapeutic Activity: 8-22 mins                     3:06 PM, 04/19/18 Lonell Grandchild, MPT Physical Therapist with [facility] 336 [phone] office [phone] mobile phone

## 2018-04-19 NOTE — Discharge Summary (Signed)
Physician Discharge Summary  [name] NIO:[id number] DOB: [birth year] DOA: 04/14/2018  PCP: Kathyrn Drown, MD  Admit date: 04/14/2018 Discharge date: 04/19/2018  Admitted From: Home Disposition:  SNF  Recommendations for Outpatient Follow-up:  1. Follow up with PCP in 1-2 weeks 2. Please obtain BMP/CBC in one week 3. Please take patient to neurosurgery appointment with Dr. Zada Finders at [facility] on 04/21/2018 at 1300 4. Please take pt to [facility] appointment on 04/22/2018 @ 1440 5. Please remove foley catheter on 04/25/2018 for voiding trial 6. Please schedule appointment with [facility] for urine retention   Discharge Condition: Stable CODE STATUS: DNR Diet recommendation: Regular   Brief/Interim Summary: 85 y.o.malewith medical history ofcoronary artery disease, COPD, Parkinson's disease, chronic diastolic CHF, horseshoe kidney, hypertension, hyperlipidemia, squamous cell lung cancer, and cognitive impairment presenting with altered mental status. The patient has been having mechanical falls for the last several months. The patient had a fall approximately 2 weeks prior to this admission. The patient's son brought the patient to the emergency department on 03/27/2018. Work-up at that time revealed that the patient had an odontoid fracture. Neurosurgery was consulted at that time and felt the patient would benefit from nonoperative management. The patient was placed in an Aspen collar and discharged home.   Since his fall 2 weeks prior to this admission, the patient's son states that the patient has had a cognitive decline and functional decline worse than his baseline. He has been having more confusion.Son states that he has been more restless at home walking from room to room requiring more reorientation;however,further history from the son reveals that the patient has had a gradual cognitive and functional decline over the past year. The  patient's son states that he has been having syncopal episodes, at least 5 since his ED visit. However further history suggest that these may be mechanical falls or may be due to gait instability. The patient's son states that the patient is usually falling when repositioning his walker, and the patient falls back onto the sons hands during which time the son feels like the patient may have been "out" for only a few seconds. However, the patient remains conversant and answers appropriately. There is no bowel bladder incontinence or tongue biting.  Discharge Diagnoses:  Acute metabolic encephalopathy -Likely multifactorial including recent use of opioids, constipation, urinary retention, and dehydration -Obtain urinalysis and urine culture--not suggestive of UTI -Serum G18--2993 -Folic ZJIR--67.8 -LFY--1.017 -Ammonia--9 -The patient likely has continued progression of his underlying cognitive impairment -overall mental status has improved, but he remains pleasantly confusedwith intermittent agitation  Questionable syncopal episodes/orthostatic hypotension -Clinical history does not suggest true syncope -echo--EF60-65%, G2 DD, trivial AI, mild MR -Orthostatic vital signs--positive -EEG--negative for epileptiform discharges -The patient has a component of dysautonomia from his Parkinson's disease -Patient was started on IV fluids and fluid resuscitated  Failure to thrive -Dietitian consult -Liberalize diet -IV fluids -Work-up as above -Palliative medicine consultation for goals of care-->DNR  Fecal impaction -IncreasedMiraLAX bid -soap suds enema>>>no success -asked RN to assist withDisimpaction>>no success -The patient continues to have fecal impaction and constipation. Despite aggressive measures including MiraLAX, stimulants, and soapsuds enema, he continues to retain stool at this time -try SMOG enema-->numerous bowel movements -Continue maintenance bowel regimen  including MiraLAX bid and Senokot 2 tabs on a daily basis--only hold for diarrhea  Acute gouty arthritis -Start prednisone burst 04/18/2018 x 5 days -slowly improving right foot  CKD stage III -Baseline creatinine 1.8 -2.1 -serum  creatinine 1.74 on day of d/c  COPD -Stable on room air -Continueduo nebs -Continue Symbicort  Urinary retention/Hydronephrosis -secondary to the patient's stool impaction and severe constipation -Coude Foley catheter placed1/05/2018 -Plan for voiding trial once the patient's constipation is improved -Continue tamsulosin -renal US post foley to assess hydronephrosis--improved left-sided hydronephrosis, unchanged mild right hydronephrosis--pt had not have any BM at that time, but anticipate improvement with BMs  Coronary artery disease -Stable presently without any chest pain -EKG without concerning ischemic changes -Continue aspirin, Lipitor, metoprolol  Parkinson's disease -Was followed by neurology at [facility] -continue Sinemet  Goals of care -Palliative medicine consulted -Patient is now DNR -The patient has exhibited a functional decline from his premorbid state. He is not safe to return home and his premorbid living condition. He will require physical therapy/Occupational Therapy to improve his functional status and hopes to return to his premorbid functional state.  Severe malnutrition -Continue Ensure   Discharge Instructions   Allergies as of 04/19/2018      Reactions   Propranolol Nausea Only   Levaquin [levofloxacin In D5w] Hives, Nausea And Vomiting   Penicillins Hives, Swelling   Patient has tolerated cephalosporins several times   Sulfonamide Derivatives Hives, Swelling   Sympathomimetics Other (See Comments)   Tape Other (See Comments)   SKIN IS VERY THIN AND TEARS EASILY; PLEASE USE AN ALTERNATIVE TO TAPE!!      Medication List    STOP taking these medications   docusate sodium 100 MG capsule Commonly known as:   COLACE     TAKE these medications   albuterol 108 (90 Base) MCG/ACT inhaler Commonly known as:  VENTOLIN HFA INHALE 2 PUFFS INTO THE LUNGS EVERY 6 HOURS AS NEEDED FOR SHORTNESS OF BREATH   albuterol (2.5 MG/3ML) 0.083% nebulizer solution Commonly known as:  PROVENTIL Inhale 3 mLs into the lungs every 4 (four) hours as needed for wheezing or shortness of breath.   aspirin EC 81 MG tablet Take 81 mg by mouth daily.   atorvastatin 40 MG tablet Commonly known as:  LIPITOR TAKE 1 TABLET (40 MG TOTAL) BY MOUTH DAILY.   budesonide-formoterol 160-4.5 MCG/ACT inhaler Commonly known as:  SYMBICORT INHALE 2 PUFF INTO THE LUNGS 2 TIMES DAILY.   carbidopa-levodopa 25-100 MG tablet Commonly known as:  SINEMET IR Take three tablets by mouth three times daily   CENTRUM SILVER 50+MEN Tabs Take 1 tablet by mouth daily.   feeding supplement (ENSURE ENLIVE) Liqd Take 237 mLs by mouth 2 (two) times daily between meals.   guaiFENesin 600 MG 12 hr tablet Commonly known as:  MUCINEX Take 1 tablet (600 mg total) by mouth 2 (two) times daily.   metoprolol tartrate 25 MG tablet Commonly known as:  LOPRESSOR TAKE 1 TABLET BY MOUTH TWICE A DAY   mupirocin cream 2 % Commonly known as:  BACTROBAN Apply 1 application topically 2 (two) times daily.   mupirocin ointment 2 % Commonly known as:  BACTROBAN APPLY TO SKIN 2 TIMES A DAY.   nitroGLYCERIN 0.4 MG SL tablet Commonly known as:  NITROSTAT Place 1 tablet (0.4 mg total) under the tongue every 5 (five) minutes as needed for chest pain. May take up to 3 doses per episode.   pantoprazole 40 MG tablet Commonly known as:  PROTONIX TAKE 1 TABLET BY MOUTH DAILY.   polyethylene glycol packet Commonly known as:  MIRALAX / GLYCOLAX Take 17 g by mouth 2 (two) times daily.   predniSONE 50 MG tablet Commonly known as:  DELTASONE Take 1 tablet (50 mg total) by mouth daily with breakfast. X 3 days Start taking on:  April 20, 2018   senna 8.6 MG  Tabs tablet Commonly known as:  SENOKOT Take 2 tablets (17.2 mg total) by mouth at bedtime. What changed:  when to take this   SOOTHE XP Soln Place 1-2 drops into both eyes 2 (two) times daily as needed (for dry eyes).   tamsulosin 0.4 MG Caps capsule Commonly known as:  FLOMAX TAKE ONE CAPSULE BY MOUTH AT BEDTIME.   tiotropium 18 MCG inhalation capsule Commonly known as:  SPIRIVA Place 1 capsule (18 mcg total) into inhaler and inhale daily.   torsemide 20 MG tablet Commonly known as:  DEMADEX Take 1/2 tablet to one whole tablet prn  for pedal edema      Contact information for after-discharge care    [facility] .   Service:  Skilled Nursing Contact information: [address] [address] [phone]             Allergies  Allergen Reactions  . Propranolol Nausea Only  . Levaquin [Levofloxacin In D5w] Hives and Nausea And Vomiting  . Penicillins Hives and Swelling    Patient has tolerated cephalosporins several times  . Sulfonamide Derivatives Hives and Swelling  . Sympathomimetics Other (See Comments)  . Tape Other (See Comments)    SKIN IS VERY THIN AND TEARS EASILY; PLEASE USE AN ALTERNATIVE TO TAPE!!    Consultations: None    Procedures/Studies: Dg Chest 2 View  Result Date: 04/14/2018 CLINICAL DATA:  Weakness and syncope. Fell today. History of lung cancer. EXAM: CHEST - 2 VIEW COMPARISON:  Multiple prior chest x-rays from 2019 and 2018. FINDINGS: The cardiac silhouette, mediastinal and hilar contours are within normal limits and stable. Moderate tortuosity and calcification of the thoracic aorta is again demonstrated. Prior bypass surgery. Chronic left apical scarring changes and probable radiation changes. Underlying emphysema. No acute overlying pulmonary findings. Chronic right basilar scarring changes and pleural thickening. The bony thorax is intact. IMPRESSION: Chronic emphysematous  changes, pulmonary scarring and left upper lobe scarring and radiation. No acute overlying pulmonary process. Electronically Signed   By: Marijo Sanes M.D.   On: 04/14/2018 08:10   Dg Cervical Spine Complete  Result Date: 03/27/2018 CLINICAL DATA:  Blacked out.  Fall. EXAM: CERVICAL SPINE - COMPLETE 4+ VIEW COMPARISON:  CT scan March 10, 2011 FINDINGS: Dense calcifications in the neck or consistent with carotid calcifications. Minimal anterolisthesis of C2 versus C3 is unchanged and likely degenerative. Minimal anterolisthesis of C7 versus T1 is stable as well. No other malalignment identified. No fractures are seen. Multilevel degenerative disc disease. The neural foramina are patent. Chronic pleuroparenchymal thickening in the left apex is stable since at least December 2018. The superior most sternal wire is fractured, also stable. A dedicated odontoid view is limited. IMPRESSION: 1. This study is significantly limited due to positioning and osteopenia. No definitive fracture or traumatic malalignment. However, if there is clinical concern for cervical spine fracture, a CT scan is recommended. 2. Dense carotid calcifications. 3. Chronic changes in the left apex of the lung. Electronically Signed   By: Dorise Bullion III M.D   On: 03/27/2018 09:16   Ct Head Wo Contrast  Result Date: 04/14/2018 CLINICAL DATA:  Tremors and altered mental status EXAM: CT HEAD WITHOUT CONTRAST TECHNIQUE: Contiguous axial images were obtained from the base of the skull through  the vertex without intravenous contrast. COMPARISON:  March 27, 2018 FINDINGS: Brain: Mild diffuse atrophy is stable. There is no intracranial mass, hemorrhage, extra-axial fluid collection, or midline shift. There is patchy small vessel disease in the centra semiovale bilaterally. There is an apparent old infarct in the peripheral left temporal-occipital region, stable. No acute infarct is appreciable. Vascular: There is no evident vascular  calcification. There is calcification in each distal vertebral artery and carotid siphon region. Skull: Bony calvarium appears intact. Sinuses/Orbits: There is mucosal thickening in multiple ethmoid air cells bilaterally. Other visualized paranasal sinuses are clear. Orbits appear symmetric bilaterally. Other: Mastoid air cells are clear. IMPRESSION: Atrophy with supratentorial small vessel disease. Prior left temporal-occipital infarct. No acute infarct. No mass or hemorrhage. There are foci of arterial vascular calcification. There is mucosal thickening in several ethmoid air cells. Electronically Signed   By: Lowella Grip III M.D.   On: 04/14/2018 10:30   Ct Head Wo Contrast  Result Date: 03/27/2018 CLINICAL DATA:  Fall after loss of consciousness.  Facial trauma. EXAM: CT HEAD WITHOUT CONTRAST CT MAXILLOFACIAL WITHOUT CONTRAST TECHNIQUE: Multidetector CT imaging of the head and maxillofacial structures were performed using the standard protocol without intravenous contrast. Multiplanar CT image reconstructions of the maxillofacial structures were also generated. COMPARISON:  CT scan of July 28, 2016. FINDINGS: CT HEAD FINDINGS Brain: Mild diffuse cortical atrophy is noted. Mild chronic ischemic white matter disease is noted. No mass effect or midline shift is noted. Ventricular size is within normal limits. There is no evidence of mass lesion, hemorrhage or acute infarction. Vascular: No hyperdense vessel or unexpected calcification. Skull: Normal. Negative for fracture or focal lesion. Other: None. CT MAXILLOFACIAL FINDINGS Osseous: Mildly displaced type 2 odontoid fracture of C2 is noted. The mandible is unremarkable. No other bony abnormality is noted in maxillofacial region. Orbits: Negative. No traumatic or inflammatory finding. Sinuses: Clear. Soft tissues: Negative. IMPRESSION: Mild diffuse cortical atrophy. Mild chronic ischemic white matter disease. No acute intracranial abnormality seen.  Mildly displaced type 2 odontoid fracture of C2 is noted. Critical Value/emergent results were called by telephone at the time of interpretation on 03/27/2018 at 9:29 am to Dr. Kris Mouton, who verbally acknowledged these results. No definite abnormality seen in maxillofacial region. Electronically Signed   By: Marijo Conception, M.D.   On: 03/27/2018 09:30   Ct Cervical Spine Wo Contrast  Result Date: 03/27/2018 CLINICAL DATA:  Was walking today and suffered a loss of consciousness, fall, multiple lacerations to face; history lung cancer, hypertension, coronary artery disease, former smoker; abnormal CT facial bones EXAM: CT CERVICAL SPINE WITHOUT CONTRAST TECHNIQUE: Multidetector CT imaging of the cervical spine was performed without intravenous contrast. Multiplanar CT image reconstructions were also generated. COMPARISON:  03/10/2011 CT cervical spine, chest radiograph 11/22/2017 FINDINGS: Alignment: Mild anterolisthesis at C7-T1, unchanged. Remaining alignments normal Skull base and vertebrae: Diffuse osseous demineralization. Visualized skull base intact. Nondisplaced type II odontoid fracture, minimally oblique anterior to posterior. Prominent calcified pannus posterior to the odontoid process. Vertebral body heights maintained. No additional fracture, subluxation or bone destruction. Multilevel disc space narrowing and endplate spur formation. Multilevel facet degenerative changes bilaterally. Soft tissues and spinal canal: Prevertebral soft tissues normal thickness. Dense atherosclerotic calcifications aorta. Disc levels:  No additional abnormalities Upper chest: RIGHT lung apex clear. Opacified LEFT lung apex, corresponding to pleural fluid/thickening on prior chest radiographs, question sequela of treatment of lung cancer. Other: N/A IMPRESSION: Nondisplaced minimally oblique type II odontoid fracture. Osseous demineralization with multilevel degenerative  disc and facet disease changes of the cervical  spine. Pleural scarring and effusion at LEFT apex unchanged since prior chest radiograph, question sequela of treatment for lung cancer. Electronically Signed   By: Lavonia Dana M.D.   On: 03/27/2018 10:27   Ct Abdomen Pelvis W Contrast  Result Date: 04/14/2018 CLINICAL DATA:  Abdominal pain, acute, generalized. EXAM: CT ABDOMEN AND PELVIS WITH CONTRAST TECHNIQUE: Multidetector CT imaging of the abdomen and pelvis was performed using the standard protocol following bolus administration of intravenous contrast. CONTRAST:  54mL OMNIPAQUE IOHEXOL 300 MG/ML  SOLN COMPARISON:  10/29/2017 FINDINGS: Lower chest: Moderate sliding hiatal hernia. Extensive coronary calcification. Scarring in the right lower lobe with volume loss and mild bronchiectasis. Mild centrilobular emphysema. Hepatobiliary: No focal liver abnormality.No evidence of biliary obstruction or stone. Pancreas: Unremarkable. Spleen: Unremarkable. Adrenals/Urinary Tract: Negative adrenals. Horseshoe kidney. Bilateral hydroureteronephrosis likely from the very distended bladder which is likely patulous at baseline due to the partially folded appearance. Stomach/Bowel: Formed stool throughout much of the colon, most notably the rectum which is distended to 10 cm and fills the pelvis. No evident bowel inflammation. The appendix is difficult to discretely visualize. Vascular/Lymphatic: Extensive atherosclerotic calcification with high-grade bilateral common femoral and superficial femoral stenosis. No mass or adenopathy. Reproductive:Negative Other: No ascites or pneumoperitoneum. Musculoskeletal: Advanced spinal degeneration. No acute osseous finding. IMPRESSION: 1. Constipation with rectal impaction and distention to 10 cm. 2. Dilated bladder, possibly related to#1, with bilateral hydronephrosis of the horseshoe kidneys. 3. Chronic findings are described above. Electronically Signed   By: Monte Fantasia M.D.   On: 04/14/2018 10:30   US Renal  Result Date:  04/15/2018 CLINICAL DATA:  Hydronephrosis. EXAM: RENAL / URINARY TRACT ULTRASOUND COMPLETE COMPARISON:  CT abdomen pelvis from yesterday. FINDINGS: Right Kidney: Renal measurements: 10.4 x 4.4 x 4.6 cm = volume: 108 mL . Echogenicity within normal limits. Mild right hydronephrosis is unchanged. No mass visualized. Left Kidney: Renal measurements: 10.6 x 4.2 x 3.7 cm = volume: 86 mL. Echogenicity within normal limits. No mass or hydronephrosis visualized. Bladder: Decompressed by Foley catheter. IMPRESSION: 1. Unchanged mild right hydronephrosis. Resolved left hydronephrosis. Electronically Signed   By: Titus Dubin M.D.   On: 04/15/2018 12:22   Ct Maxillofacial Wo Cm  Result Date: 03/27/2018 CLINICAL DATA:  Fall after loss of consciousness.  Facial trauma. EXAM: CT HEAD WITHOUT CONTRAST CT MAXILLOFACIAL WITHOUT CONTRAST TECHNIQUE: Multidetector CT imaging of the head and maxillofacial structures were performed using the standard protocol without intravenous contrast. Multiplanar CT image reconstructions of the maxillofacial structures were also generated. COMPARISON:  CT scan of July 28, 2016. FINDINGS: CT HEAD FINDINGS Brain: Mild diffuse cortical atrophy is noted. Mild chronic ischemic white matter disease is noted. No mass effect or midline shift is noted. Ventricular size is within normal limits. There is no evidence of mass lesion, hemorrhage or acute infarction. Vascular: No hyperdense vessel or unexpected calcification. Skull: Normal. Negative for fracture or focal lesion. Other: None. CT MAXILLOFACIAL FINDINGS Osseous: Mildly displaced type 2 odontoid fracture of C2 is noted. The mandible is unremarkable. No other bony abnormality is noted in maxillofacial region. Orbits: Negative. No traumatic or inflammatory finding. Sinuses: Clear. Soft tissues: Negative. IMPRESSION: Mild diffuse cortical atrophy. Mild chronic ischemic white matter disease. No acute intracranial abnormality seen. Mildly displaced  type 2 odontoid fracture of C2 is noted. Critical Value/emergent results were called by telephone at the time of interpretation on 03/27/2018 at 9:29 am to Dr. Kris Mouton, who verbally acknowledged these  results. No definite abnormality seen in maxillofacial region. Electronically Signed   By: Marijo Conception, M.D.   On: 03/27/2018 09:30        Discharge Exam: Vitals:   04/19/18 0748 04/19/18 1405  BP:  140/74  Pulse:  90  Resp:  18  Temp:    SpO2: 93% 96%   Vitals:   04/18/18 2151 04/19/18 0604 04/19/18 0748 04/19/18 1405  BP: (!) 147/61 140/70  140/74  Pulse: 95 86  90  Resp:  17  18  Temp: 98.6 F (37 C) 97.8 F (36.6 C)    TempSrc: Oral Oral    SpO2: 96% 96% 93% 96%  Weight:      Height:        General: Pt is alert, awake, not in acute distress Cardiovascular: RRR, S1/S2 +, no rubs, no gallops Respiratory: bibasilar crackles, no wheeze Abdominal: Soft, NT, ND, bowel sounds + Extremities: no edema, no cyanosis   The results of significant diagnostics from this hospitalization (including imaging, microbiology, ancillary and laboratory) are listed below for reference.    Significant Diagnostic Studies: Dg Chest 2 View  Result Date: 04/14/2018 CLINICAL DATA:  Weakness and syncope. Fell today. History of lung cancer. EXAM: CHEST - 2 VIEW COMPARISON:  Multiple prior chest x-rays from 2019 and 2018. FINDINGS: The cardiac silhouette, mediastinal and hilar contours are within normal limits and stable. Moderate tortuosity and calcification of the thoracic aorta is again demonstrated. Prior bypass surgery. Chronic left apical scarring changes and probable radiation changes. Underlying emphysema. No acute overlying pulmonary findings. Chronic right basilar scarring changes and pleural thickening. The bony thorax is intact. IMPRESSION: Chronic emphysematous changes, pulmonary scarring and left upper lobe scarring and radiation. No acute overlying pulmonary process. Electronically  Signed   By: Marijo Sanes M.D.   On: 04/14/2018 08:10   Dg Cervical Spine Complete  Result Date: 03/27/2018 CLINICAL DATA:  Blacked out.  Fall. EXAM: CERVICAL SPINE - COMPLETE 4+ VIEW COMPARISON:  CT scan March 10, 2011 FINDINGS: Dense calcifications in the neck or consistent with carotid calcifications. Minimal anterolisthesis of C2 versus C3 is unchanged and likely degenerative. Minimal anterolisthesis of C7 versus T1 is stable as well. No other malalignment identified. No fractures are seen. Multilevel degenerative disc disease. The neural foramina are patent. Chronic pleuroparenchymal thickening in the left apex is stable since at least December 2018. The superior most sternal wire is fractured, also stable. A dedicated odontoid view is limited. IMPRESSION: 1. This study is significantly limited due to positioning and osteopenia. No definitive fracture or traumatic malalignment. However, if there is clinical concern for cervical spine fracture, a CT scan is recommended. 2. Dense carotid calcifications. 3. Chronic changes in the left apex of the lung. Electronically Signed   By: Dorise Bullion III M.D   On: 03/27/2018 09:16   Ct Head Wo Contrast  Result Date: 04/14/2018 CLINICAL DATA:  Tremors and altered mental status EXAM: CT HEAD WITHOUT CONTRAST TECHNIQUE: Contiguous axial images were obtained from the base of the skull through the vertex without intravenous contrast. COMPARISON:  March 27, 2018 FINDINGS: Brain: Mild diffuse atrophy is stable. There is no intracranial mass, hemorrhage, extra-axial fluid collection, or midline shift. There is patchy small vessel disease in the centra semiovale bilaterally. There is an apparent old infarct in the peripheral left temporal-occipital region, stable. No acute infarct is appreciable. Vascular: There is no evident vascular calcification. There is calcification in each distal vertebral artery and carotid siphon region.  Skull: Bony calvarium appears  intact. Sinuses/Orbits: There is mucosal thickening in multiple ethmoid air cells bilaterally. Other visualized paranasal sinuses are clear. Orbits appear symmetric bilaterally. Other: Mastoid air cells are clear. IMPRESSION: Atrophy with supratentorial small vessel disease. Prior left temporal-occipital infarct. No acute infarct. No mass or hemorrhage. There are foci of arterial vascular calcification. There is mucosal thickening in several ethmoid air cells. Electronically Signed   By: Lowella Grip III M.D.   On: 04/14/2018 10:30   Ct Head Wo Contrast  Result Date: 03/27/2018 CLINICAL DATA:  Fall after loss of consciousness.  Facial trauma. EXAM: CT HEAD WITHOUT CONTRAST CT MAXILLOFACIAL WITHOUT CONTRAST TECHNIQUE: Multidetector CT imaging of the head and maxillofacial structures were performed using the standard protocol without intravenous contrast. Multiplanar CT image reconstructions of the maxillofacial structures were also generated. COMPARISON:  CT scan of July 28, 2016. FINDINGS: CT HEAD FINDINGS Brain: Mild diffuse cortical atrophy is noted. Mild chronic ischemic white matter disease is noted. No mass effect or midline shift is noted. Ventricular size is within normal limits. There is no evidence of mass lesion, hemorrhage or acute infarction. Vascular: No hyperdense vessel or unexpected calcification. Skull: Normal. Negative for fracture or focal lesion. Other: None. CT MAXILLOFACIAL FINDINGS Osseous: Mildly displaced type 2 odontoid fracture of C2 is noted. The mandible is unremarkable. No other bony abnormality is noted in maxillofacial region. Orbits: Negative. No traumatic or inflammatory finding. Sinuses: Clear. Soft tissues: Negative. IMPRESSION: Mild diffuse cortical atrophy. Mild chronic ischemic white matter disease. No acute intracranial abnormality seen. Mildly displaced type 2 odontoid fracture of C2 is noted. Critical Value/emergent results were called by telephone at the time of  interpretation on 03/27/2018 at 9:29 am to Dr. Kris Mouton, who verbally acknowledged these results. No definite abnormality seen in maxillofacial region. Electronically Signed   By: Marijo Conception, M.D.   On: 03/27/2018 09:30   Ct Cervical Spine Wo Contrast  Result Date: 03/27/2018 CLINICAL DATA:  Was walking today and suffered a loss of consciousness, fall, multiple lacerations to face; history lung cancer, hypertension, coronary artery disease, former smoker; abnormal CT facial bones EXAM: CT CERVICAL SPINE WITHOUT CONTRAST TECHNIQUE: Multidetector CT imaging of the cervical spine was performed without intravenous contrast. Multiplanar CT image reconstructions were also generated. COMPARISON:  03/10/2011 CT cervical spine, chest radiograph 11/22/2017 FINDINGS: Alignment: Mild anterolisthesis at C7-T1, unchanged. Remaining alignments normal Skull base and vertebrae: Diffuse osseous demineralization. Visualized skull base intact. Nondisplaced type II odontoid fracture, minimally oblique anterior to posterior. Prominent calcified pannus posterior to the odontoid process. Vertebral body heights maintained. No additional fracture, subluxation or bone destruction. Multilevel disc space narrowing and endplate spur formation. Multilevel facet degenerative changes bilaterally. Soft tissues and spinal canal: Prevertebral soft tissues normal thickness. Dense atherosclerotic calcifications aorta. Disc levels:  No additional abnormalities Upper chest: RIGHT lung apex clear. Opacified LEFT lung apex, corresponding to pleural fluid/thickening on prior chest radiographs, question sequela of treatment of lung cancer. Other: N/A IMPRESSION: Nondisplaced minimally oblique type II odontoid fracture. Osseous demineralization with multilevel degenerative disc and facet disease changes of the cervical spine. Pleural scarring and effusion at LEFT apex unchanged since prior chest radiograph, question sequela of treatment for lung  cancer. Electronically Signed   By: Lavonia Dana M.D.   On: 03/27/2018 10:27   Ct Abdomen Pelvis W Contrast  Result Date: 04/14/2018 CLINICAL DATA:  Abdominal pain, acute, generalized. EXAM: CT ABDOMEN AND PELVIS WITH CONTRAST TECHNIQUE: Multidetector CT imaging of the abdomen and pelvis  was performed using the standard protocol following bolus administration of intravenous contrast. CONTRAST:  27mL OMNIPAQUE IOHEXOL 300 MG/ML  SOLN COMPARISON:  10/29/2017 FINDINGS: Lower chest: Moderate sliding hiatal hernia. Extensive coronary calcification. Scarring in the right lower lobe with volume loss and mild bronchiectasis. Mild centrilobular emphysema. Hepatobiliary: No focal liver abnormality.No evidence of biliary obstruction or stone. Pancreas: Unremarkable. Spleen: Unremarkable. Adrenals/Urinary Tract: Negative adrenals. Horseshoe kidney. Bilateral hydroureteronephrosis likely from the very distended bladder which is likely patulous at baseline due to the partially folded appearance. Stomach/Bowel: Formed stool throughout much of the colon, most notably the rectum which is distended to 10 cm and fills the pelvis. No evident bowel inflammation. The appendix is difficult to discretely visualize. Vascular/Lymphatic: Extensive atherosclerotic calcification with high-grade bilateral common femoral and superficial femoral stenosis. No mass or adenopathy. Reproductive:Negative Other: No ascites or pneumoperitoneum. Musculoskeletal: Advanced spinal degeneration. No acute osseous finding. IMPRESSION: 1. Constipation with rectal impaction and distention to 10 cm. 2. Dilated bladder, possibly related to#1, with bilateral hydronephrosis of the horseshoe kidneys. 3. Chronic findings are described above. Electronically Signed   By: Monte Fantasia M.D.   On: 04/14/2018 10:30   US Renal  Result Date: 04/15/2018 CLINICAL DATA:  Hydronephrosis. EXAM: RENAL / URINARY TRACT ULTRASOUND COMPLETE COMPARISON:  CT abdomen pelvis from  yesterday. FINDINGS: Right Kidney: Renal measurements: 10.4 x 4.4 x 4.6 cm = volume: 108 mL . Echogenicity within normal limits. Mild right hydronephrosis is unchanged. No mass visualized. Left Kidney: Renal measurements: 10.6 x 4.2 x 3.7 cm = volume: 86 mL. Echogenicity within normal limits. No mass or hydronephrosis visualized. Bladder: Decompressed by Foley catheter. IMPRESSION: 1. Unchanged mild right hydronephrosis. Resolved left hydronephrosis. Electronically Signed   By: Titus Dubin M.D.   On: 04/15/2018 12:22   Ct Maxillofacial Wo Cm  Result Date: 03/27/2018 CLINICAL DATA:  Fall after loss of consciousness.  Facial trauma. EXAM: CT HEAD WITHOUT CONTRAST CT MAXILLOFACIAL WITHOUT CONTRAST TECHNIQUE: Multidetector CT imaging of the head and maxillofacial structures were performed using the standard protocol without intravenous contrast. Multiplanar CT image reconstructions of the maxillofacial structures were also generated. COMPARISON:  CT scan of July 28, 2016. FINDINGS: CT HEAD FINDINGS Brain: Mild diffuse cortical atrophy is noted. Mild chronic ischemic white matter disease is noted. No mass effect or midline shift is noted. Ventricular size is within normal limits. There is no evidence of mass lesion, hemorrhage or acute infarction. Vascular: No hyperdense vessel or unexpected calcification. Skull: Normal. Negative for fracture or focal lesion. Other: None. CT MAXILLOFACIAL FINDINGS Osseous: Mildly displaced type 2 odontoid fracture of C2 is noted. The mandible is unremarkable. No other bony abnormality is noted in maxillofacial region. Orbits: Negative. No traumatic or inflammatory finding. Sinuses: Clear. Soft tissues: Negative. IMPRESSION: Mild diffuse cortical atrophy. Mild chronic ischemic white matter disease. No acute intracranial abnormality seen. Mildly displaced type 2 odontoid fracture of C2 is noted. Critical Value/emergent results were called by telephone at the time of  interpretation on 03/27/2018 at 9:29 am to Dr. Kris Mouton, who verbally acknowledged these results. No definite abnormality seen in maxillofacial region. Electronically Signed   By: Marijo Conception, M.D.   On: 03/27/2018 09:30     Microbiology: Recent Results (from the past 240 hour(s))  Culture, Urine     Status: None   Collection Time: 04/14/18  2:50 PM  Result Value Ref Range Status   Specimen Description   Final    URINE, CATHETERIZED Performed at [facility], [place]  [address] [place], [place] [address]    Special Requests   Final    NONE Performed at [facility] [address], [place], [place] [address]    Culture   Final    NO GROWTH Performed at [facility], [place] [address], [place],  [address]    Report Status 04/15/2018 FINAL  Final     Labs: Basic Metabolic Panel: Recent Labs  Lab 04/14/18 0832 04/15/18 0719 04/16/18 0621 04/17/18 0640 04/18/18 0542  NA 136 140 139 139 138  K 4.1 4.0 3.8 3.8 4.0  CL 106 111 111 111 115*  CO2 23 23 22 22  21*  GLUCOSE 108* 98 94 97 106*  BUN 43* 41* 35* 33* 31*  CREATININE 1.85* 1.92* 1.82* 1.75* 1.74*  CALCIUM 8.8* 8.6* 8.5* 8.6* 8.4*   Liver Function Tests: Recent Labs  Lab 04/14/18 0832  AST 20  ALT 5  ALKPHOS 81  BILITOT 0.5  PROT 6.2*  ALBUMIN 3.3*   No results for input(s): LIPASE, AMYLASE in the last 168 hours. Recent Labs  Lab 04/14/18 1357  AMMONIA 9   CBC: Recent Labs  Lab 04/14/18 0832 04/15/18 0719 04/16/18 0621 04/17/18 0640  WBC 9.1 10.1 10.4 11.2*  NEUTROABS 7.0  --   --   --   HGB 9.4* 8.4* 7.9* 8.3*  HCT 30.1* 27.2* 26.0* 27.3*  MCV 98.0 96.8 98.1 98.9  PLT 290 252 238 241   Cardiac Enzymes: Recent Labs  Lab 04/14/18 0832  TROPONINI <0.03   BNP: Invalid input(s): POCBNP CBG: No results for input(s): GLUCAP in the last 168 hours.  Time coordinating discharge:  36 minutes  Signed:  Orson Eva, DO [facility] Pager: [phone] 04/19/2018, 4:12 PM

## 2018-04-20 ENCOUNTER — Non-Acute Institutional Stay (SKILLED_NURSING_FACILITY): Payer: Medicare Other | Admitting: Internal Medicine

## 2018-04-20 ENCOUNTER — Encounter: Payer: Self-pay | Admitting: Internal Medicine

## 2018-04-20 DIAGNOSIS — G20A1 Parkinson's disease without dyskinesia, without mention of fluctuations: Secondary | ICD-10-CM

## 2018-04-20 DIAGNOSIS — R55 Syncope and collapse: Secondary | ICD-10-CM

## 2018-04-20 DIAGNOSIS — G20 Parkinson's disease: Secondary | ICD-10-CM

## 2018-04-20 DIAGNOSIS — K5641 Fecal impaction: Secondary | ICD-10-CM

## 2018-04-20 DIAGNOSIS — D649 Anemia, unspecified: Secondary | ICD-10-CM

## 2018-04-20 DIAGNOSIS — J431 Panlobular emphysema: Secondary | ICD-10-CM

## 2018-04-20 DIAGNOSIS — I5032 Chronic diastolic (congestive) heart failure: Secondary | ICD-10-CM | POA: Diagnosis not present

## 2018-04-20 DIAGNOSIS — G9341 Metabolic encephalopathy: Secondary | ICD-10-CM

## 2018-04-20 NOTE — Progress Notes (Signed)
Location:    [facility] Room Number: 125/P Place of Service:  SNF (31) Provider: Granville Ger PA-C  Kathyrn Drown, MD  Patient Care Team: Kathyrn Drown, MD as PCP - General [facility] Medicine)  Extended Emergency Contact Information Primary Emergency Contact: [facility] Address: [address]          [place], [place] [address] Johnnette Litter of [place] Phone: [phone] Mobile Phone: [phone] Relation: Son Secondary Emergency Contact: [facility] Address: [address]          [place], [place] [address] [place] Phone: [phone] Relation: Spouse  Code Status:  DNR Goals of care: Advanced Directive information Advanced Directives 04/20/2018  Does Patient Have a Medical Advance Directive? Yes  Type of Advance Directive Out of facility DNR (pink MOST or yellow form)  Does patient want to make changes to medical advance directive? No - Patient declined  Copy of [place] in Chart? -  Would patient like information on creating a medical advance directive? No - Patient declined  Pre-existing out of facility DNR order (yellow form or pink MOST form) -     Chief Complaint  Patient presents with  . Hospitalization Follow-up    Hospitalization F/U Visit  Status post hospitalization for syncopal type episodes with metabolic encephalopathy-constipation- history of recurrent falls  HPI:  Pt is a 85 y.o. male seen today for a hospital f/u for apparently recurrent falls with questionable syncope with associated metabolic encephalopathy and constipation-also recent odontoid fracture---  Apparently patient had had recurrent falls at home with questionable syncope although per hospital assessment is thought this is probably not true syncope-probably some association with his parkinsonian   Patient has a history of coronary artery disease as well as Parkinson's disease COPD chronic diastolic CHF horseshoe kidney as  well as hypertension hyperlipidemia squamous cell lung cancer pain progressing cognitive impairment apparently presented to the hospital with altered mental status with recurrent falls apparently however during the falls patient was alert.  Was thought possibly there may be parkinsonian  element to the falls  He had sustained the  odontoid  fracture during a previous fall---.  At that point neurosurgery was consulted and thought he would benefit from nonoperative manner he does have an Aspen collar and will follow-up with neurosurgery tomorrow.  Patient apparently had continued cognitive decline worse than his baseline.  During most recent hospital admission he was thought to have acute metabolic encephalopathy thought to be multifactorial secondary to opioids constipation urinary retention and dehydration.  Urine culture appeared to be negative T46 folic acid were within normal range TSH was minimally elevated but T4 was within normal range ammonia level was unremarkable.  It was cognitive decline was secondary to underlying cognitive impairment he did improve during his hospitalization he continues to be very pleasant somewhat confused but I do not see any agitation today.  In regards to syncope this was thought to be questionable- EEG was negative apparently orthostatic vital signs were fairly unremarkable.  Was thought he may have an element of dyysautonomia from his Parkinson's disease.  He also had significant fecal impaction despite getting a soapsuds enema stimulants MiraLAX- eventually he did have an SMO G and amount with good results- recommendation is to continue his MiraLAX twice a day and Senokot 2 tabs a day hold for diarrhea.  He also had acute gouty arthritis he is on a prednisone burst and apparently this is improving arthritis is in both  feet it appears.  Regards to urinary retention this was thought secondary to constipation-he does have a Foley catheter with recommendation  to do a voiding trial in January 13.  He also will need urology follow-up.  Renal ultrasound did show bilateral hydronephrosis however he has had improved on the left side- and the right side was unchanged and thought to be mild.  His chronic kidney disease appear to be at baseline on discharge he did receive IV fluids discharge creatinine was 1.74.  Currently he is sitting in his chair comfortably and does not have any acute complaints he does have some sore feet with the gout but apparently this has improved- he is sitting in the room with his wife of 43 years- he is very pleasant although apparently at baseline somewhat confused.  Vital signs are stable systolic is somewhat elevated at 158 at this point will monitor-he is on Lopressor he is also on this for history of coronary artery disease which appears to be asymptomatic at this point           .    Past Medical History:  Diagnosis Date  . ASCVD (arteriosclerotic cardiovascular disease)     CABG in 04/1993; and negative stress nuclear study in 08/2001  . Benign essential tremor   . CAD (coronary artery disease)   . Cancer ([place])    skin  . Cerebrovascular disease    Right carotid bruit-40-69% left internal carotid artery stenosis in 4/06; followed [facility]  . Chronic kidney disease, stage II (mild)    Creatinine-1.6 in 09/2008  . COPD (chronic obstructive pulmonary disease) ([place])   . Degenerative joint disease of knee, left   . Gout   . Hiatal hernia   . Horseshoe kidney   . Hyperlipidemia   . Hypertension   . Lung cancer ([place]) 2013  . Normocytic anemia   . Prediabetes   . Pulmonary disease   . Renal insufficiency   . Syncope   . Tobacco abuse, in remission    40 pack years; discontinued in 1980   Past Surgical History:  Procedure Laterality Date  . CARDIAC SURGERY    . COLONOSCOPY W/ POLYPECTOMY  1985  . CORONARY ARTERY BYPASS GRAFT  1995  . LESION EXCISION    . PILONIDAL CYST EXCISION  1948    Allergies    Allergen Reactions  . Propranolol Nausea Only  . Levaquin [Levofloxacin In D5w] Hives and Nausea And Vomiting  . Penicillins Hives and Swelling    Patient has tolerated cephalosporins several times  . Sulfonamide Derivatives Hives and Swelling  . Sympathomimetics Other (See Comments)  . Tape Other (See Comments)    SKIN IS VERY THIN AND TEARS EASILY; PLEASE USE AN ALTERNATIVE TO TAPE!!    Allergies as of 04/20/2018      Reactions   Propranolol Nausea Only   Levaquin [levofloxacin In D5w] Hives, Nausea And Vomiting   Penicillins Hives, Swelling   Patient has tolerated cephalosporins several times   Sulfonamide Derivatives Hives, Swelling   Sympathomimetics Other (See Comments)   Tape Other (See Comments)   SKIN IS VERY THIN AND TEARS EASILY; PLEASE USE AN ALTERNATIVE TO TAPE!!      Medication List       Accurate as of April 20, 2018  9:53 AM. Always use your most recent med list.        albuterol 108 (90 Base) MCG/ACT inhaler Commonly known as:  VENTOLIN HFA INHALE 2 PUFFS INTO THE LUNGS EVERY  6 HOURS AS NEEDED FOR SHORTNESS OF BREATH   albuterol (2.5 MG/3ML) 0.083% nebulizer solution Commonly known as:  PROVENTIL Inhale 3 mLs into the lungs every 4 (four) hours as needed for wheezing or shortness of breath.   aspirin EC 81 MG tablet Take 81 mg by mouth daily.   atorvastatin 40 MG tablet Commonly known as:  LIPITOR TAKE 1 TABLET (40 MG TOTAL) BY MOUTH DAILY.   budesonide-formoterol 160-4.5 MCG/ACT inhaler Commonly known as:  SYMBICORT INHALE 2 PUFF INTO THE LUNGS 2 TIMES DAILY.   carbidopa-levodopa 25-100 MG tablet Commonly known as:  SINEMET IR Take three tablets by mouth three times daily   CENTRUM SILVER 50+MEN Tabs Take 1 tablet by mouth daily.   feeding supplement (ENSURE ENLIVE) Liqd Take 237 mLs by mouth 2 (two) times daily between meals.   guaiFENesin 600 MG 12 hr tablet Commonly known as:  MUCINEX Take 1 tablet (600 mg total) by mouth 2 (two)  times daily.   metoprolol tartrate 25 MG tablet Commonly known as:  LOPRESSOR TAKE 1 TABLET BY MOUTH TWICE A DAY   nitroGLYCERIN 0.4 MG SL tablet Commonly known as:  NITROSTAT Place 1 tablet (0.4 mg total) under the tongue every 5 (five) minutes as needed for chest pain. May take up to 3 doses per episode.   pantoprazole 40 MG tablet Commonly known as:  PROTONIX TAKE 1 TABLET BY MOUTH DAILY.   polyethylene glycol packet Commonly known as:  MIRALAX / GLYCOLAX Take 17 g by mouth 2 (two) times daily.   predniSONE 50 MG tablet Commonly known as:  DELTASONE Take 1 tablet (50 mg total) by mouth daily with breakfast. X 3 days   senna 8.6 MG Tabs tablet Commonly known as:  SENOKOT Take 2 tablets (17.2 mg total) by mouth at bedtime.   SOOTHE XP Soln Place 1 drop into both eyes 2 (two) times daily as needed (for dry eyes).   tamsulosin 0.4 MG Caps capsule Commonly known as:  FLOMAX TAKE ONE CAPSULE BY MOUTH AT BEDTIME.   tiotropium 18 MCG inhalation capsule Commonly known as:  SPIRIVA Place 1 capsule (18 mcg total) into inhaler and inhale daily.       Review of Systems   This is provided by his daughter at bedside as well as by patient.  In general he is not complaining of any fever or chills.  Skin does apparently have some small skin tears lower arms bilaterally as well as left knee these are currently covered-.  Head ears eyes nose mouth and throat is not complain of visual changes or sore throat.  He does have a neck collar in place he does not complain of neck pain.  Respiratory is not complaining of shortness of breath or cough he does have a history of COPD.  Cardiac does not complain of chest pain has some mild pedal edema bilaterally as well as some right lower extremity edema chronic status post vein harvesting in the past  GI is not complaining of abdominal pain apparently is now having regular bowel movements does not complain of diarrhea.  Musculoskeletal  is not complaining of joint pain other than foot pain at this time he is being treated for gout   neurologic does not complain of dizziness headache or syncope at this time he does have a history of Parkinson's.  Psych does not complain of being depressed or anxious at this time apparently he has had some progressive cognitive decline  Immunization History  Administered  Date(s) Administered  . Influenza Split 01/10/2013  . Influenza,inj,Quad PF,6+ Mos 01/10/2014, 01/07/2015, 01/09/2016, 02/09/2017, 12/14/2017  . Influenza-Unspecified 01/12/2012  . Pneumococcal Conjugate-13 10/27/2013  . Pneumococcal Polysaccharide-23 01/11/2001, 07/13/2007  . Td 11/26/2004, 05/23/2015  . Tdap 11/22/2014  . Zoster 02/28/2008  . Zoster Recombinat (Shingrix) 03/18/2018   Pertinent  Health Maintenance Due  Topic Date Due  . INFLUENZA VACCINE  Completed  . PNA vac Low Risk Adult  Completed   Fall Risk  09/06/2015 01/07/2015 04/20/2014 09/19/2013  Falls in the past year? No No Yes No  Number falls in past yr: - - 2 or more -  Risk Factor Category  - - High Fall Risk -  Risk for fall due to : - - History of fall(s);Impaired mobility -   Functional Status Survey:    Vitals:   04/20/18 0953  BP: (!) 160/69  Pulse: 87  Resp: 17  Temp: (!) 97 F (36.1 C)  TempSrc: Oral  SpO2: 97%  --- Manual blood pressure was 158/62  Physical Exam   In general this is a pleasant elderly male in no distress sitting comfortably in his chair.  His skin is warm and dry he does have dressing over his lower arms and proximal hand bilaterally apparently these are to protect some abrasions skin tears.  He also has some covering over his right knee area.  Eyes visual acuity appears to be intact sclera and conjunctive are clear pupils appear to be reactive to light.  Oropharynx is clear mucous membranes moist he has numerous extractions.  Neck continues to have a neck collar in place.  Chest is clear to auscultation  with somewhat shallow air entry there is no labored breathing.  Heart is regular rate and rhythm without murmur gallop or rub he has mild pedal edema bilaterally and moderate edema right leg according to his daughter this is not new chronic he did have vein harvesting in the past.  Edema is cool to touch somewhat tender on the feet but he is dealing with gout  Abdomen is soft nontender with positive bowel sounds.  Musculoskeletal as noted above he does have some lower extremity edema of his feet and right leg.  He is able to move all extremities x4 grip strength appears to be relatively intact although somewhat hindered by his dressings.  Neurologic is grossly intact his speech is clear could not really appreciate lateralizing findings.  Psych he is oriented to self follow simple verbal commands without difficulty he is soft-spoken very pleasant and appropriate  Labs reviewed: Recent Labs    05/10/17 1045  10/29/17 1112  04/16/18 0621 04/17/18 0640 04/18/18 0542  NA  --    < > 139   < > 139 139 138  K  --    < > 4.1   < > 3.8 3.8 4.0  CL  --    < > 103   < > 111 111 115*  CO2  --    < > 25   < > 22 22 21*  GLUCOSE  --    < > 124*   < > 94 97 106*  BUN  --    < > 57*   < > 35* 33* 31*  CREATININE  --    < > 2.20*   < > 1.82* 1.75* 1.74*  CALCIUM 8.6   < > 9.1   < > 8.5* 8.6* 8.4*  MG 2.5*  --  2.2  --   --   --   --    < > =  values in this interval not displayed.   Recent Labs    03/16/18 0836 03/27/18 0828 04/14/18 0832  AST 16 22 20   ALT 4 5 5   ALKPHOS 83 72 81  BILITOT 0.4 0.9 0.5  PROT 5.6* 6.3* 6.2*  ALBUMIN 3.6 3.3* 3.3*   Recent Labs    11/09/17 1640 03/27/18 0828 04/14/18 0832 04/15/18 0719 04/16/18 0621 04/17/18 0640  WBC 12.8* 6.2 9.1 10.1 10.4 11.2*  NEUTROABS 8.9* 4.1 7.0  --   --   --   HGB 10.3* 9.3* 9.4* 8.4* 7.9* 8.3*  HCT 31.2* 30.3* 30.1* 27.2* 26.0* 27.3*  MCV 93 97.7 98.0 96.8 98.1 98.9  PLT 276 193 290 252 238 241   Lab Results   Component Value Date   TSH 7.565 (H) 04/14/2018   Lab Results  Component Value Date   HGBA1C 5.3 01/07/2015   Lab Results  Component Value Date   CHOL 152 03/16/2018   HDL 81 03/16/2018   LDLCALC 62 03/16/2018   TRIG 45 03/16/2018   CHOLHDL 1.9 03/16/2018    Significant Diagnostic Results in last 30 days:  Dg Chest 2 View  Result Date: 04/14/2018 CLINICAL DATA:  Weakness and syncope. Fell today. History of lung cancer. EXAM: CHEST - 2 VIEW COMPARISON:  Multiple prior chest x-rays from 2019 and 2018. FINDINGS: The cardiac silhouette, mediastinal and hilar contours are within normal limits and stable. Moderate tortuosity and calcification of the thoracic aorta is again demonstrated. Prior bypass surgery. Chronic left apical scarring changes and probable radiation changes. Underlying emphysema. No acute overlying pulmonary findings. Chronic right basilar scarring changes and pleural thickening. The bony thorax is intact. IMPRESSION: Chronic emphysematous changes, pulmonary scarring and left upper lobe scarring and radiation. No acute overlying pulmonary process. Electronically Signed   By: Marijo Sanes M.D.   On: 04/14/2018 08:10   Ct Head Wo Contrast  Result Date: 04/14/2018 CLINICAL DATA:  Tremors and altered mental status EXAM: CT HEAD WITHOUT CONTRAST TECHNIQUE: Contiguous axial images were obtained from the base of the skull through the vertex without intravenous contrast. COMPARISON:  March 27, 2018 FINDINGS: Brain: Mild diffuse atrophy is stable. There is no intracranial mass, hemorrhage, extra-axial fluid collection, or midline shift. There is patchy small vessel disease in the centra semiovale bilaterally. There is an apparent old infarct in the peripheral left temporal-occipital region, stable. No acute infarct is appreciable. Vascular: There is no evident vascular calcification. There is calcification in each distal vertebral artery and carotid siphon region. Skull: Bony calvarium  appears intact. Sinuses/Orbits: There is mucosal thickening in multiple ethmoid air cells bilaterally. Other visualized paranasal sinuses are clear. Orbits appear symmetric bilaterally. Other: Mastoid air cells are clear. IMPRESSION: Atrophy with supratentorial small vessel disease. Prior left temporal-occipital infarct. No acute infarct. No mass or hemorrhage. There are foci of arterial vascular calcification. There is mucosal thickening in several ethmoid air cells. Electronically Signed   By: Lowella Grip III M.D.   On: 04/14/2018 10:30   Ct Abdomen Pelvis W Contrast  Result Date: 04/14/2018 CLINICAL DATA:  Abdominal pain, acute, generalized. EXAM: CT ABDOMEN AND PELVIS WITH CONTRAST TECHNIQUE: Multidetector CT imaging of the abdomen and pelvis was performed using the standard protocol following bolus administration of intravenous contrast. CONTRAST:  39mL OMNIPAQUE IOHEXOL 300 MG/ML  SOLN COMPARISON:  10/29/2017 FINDINGS: Lower chest: Moderate sliding hiatal hernia. Extensive coronary calcification. Scarring in the right lower lobe with volume loss and mild bronchiectasis. Mild centrilobular emphysema. Hepatobiliary: No focal liver  abnormality.No evidence of biliary obstruction or stone. Pancreas: Unremarkable. Spleen: Unremarkable. Adrenals/Urinary Tract: Negative adrenals. Horseshoe kidney. Bilateral hydroureteronephrosis likely from the very distended bladder which is likely patulous at baseline due to the partially folded appearance. Stomach/Bowel: Formed stool throughout much of the colon, most notably the rectum which is distended to 10 cm and fills the pelvis. No evident bowel inflammation. The appendix is difficult to discretely visualize. Vascular/Lymphatic: Extensive atherosclerotic calcification with high-grade bilateral common femoral and superficial femoral stenosis. No mass or adenopathy. Reproductive:Negative Other: No ascites or pneumoperitoneum. Musculoskeletal: Advanced spinal  degeneration. No acute osseous finding. IMPRESSION: 1. Constipation with rectal impaction and distention to 10 cm. 2. Dilated bladder, possibly related to#1, with bilateral hydronephrosis of the horseshoe kidneys. 3. Chronic findings are described above. Electronically Signed   By: Monte Fantasia M.D.   On: 04/14/2018 10:30   US Renal  Result Date: 04/15/2018 CLINICAL DATA:  Hydronephrosis. EXAM: RENAL / URINARY TRACT ULTRASOUND COMPLETE COMPARISON:  CT abdomen pelvis from yesterday. FINDINGS: Right Kidney: Renal measurements: 10.4 x 4.4 x 4.6 cm = volume: 108 mL . Echogenicity within normal limits. Mild right hydronephrosis is unchanged. No mass visualized. Left Kidney: Renal measurements: 10.6 x 4.2 x 3.7 cm = volume: 86 mL. Echogenicity within normal limits. No mass or hydronephrosis visualized. Bladder: Decompressed by Foley catheter. IMPRESSION: 1. Unchanged mild right hydronephrosis. Resolved left hydronephrosis. Electronically Signed   By: Titus Dubin M.D.   On: 04/15/2018 12:22    Assessment/Plan  #1 history of questionable syncopal episodes as noted in discussion above was thought this may be more Parkinson's related rather than true syncope-EEG was negative-.  Metabolic encephalopathy could be contributing to this secondary to opiates constipation urinary retention and dehydration-.  This was addressed during hospitalization and apparently is now having regular bowel movements- he does have a Foley catheter with voiding trial scheduled for January 13- he will have urology follow-up.  .  2.  Metabolic encephalopathy-please see discussion above.  3.  Constipation apparently this was quite an issue during his hospitalization but did resolve after receiving an SMO G enema-she is now on MiraLAX twice daily and Senokot 2 tabs a day apparently he is now having regular bowel movements.  4- history of urinary retention again he does have a Foley catheter in place he will need urology  follow-up voiding trial was scheduled for January 13.  5 history of chronic kidney disease he is now back at his baseline with a creatinine of 1.74 will have this updated tomorrow he received IV fluids in the hospital.  6.  History of gout he is on a prednisone burst this appears to be helping this appears to be affecting both his feet.  7.  History of failure to thrive his diet was liberalized in the hospital and will have dietary follow-up he is on Ensure for weight.  8- history of odontoid fracture he continues with an Aspen collar and has neurosurgical follow-up scheduled for tomorrow.  9- history of coronary artery disease at this point appears to be asymptomatic apparently this was not an issue during his hospitalization-he is on aspirin Lipitor and Lopressor-EKG in hospital did not show any acute concerning changes.  10 history of COPD this apparently was stable in the hospital he does have nebulizer Symbicort and inhalers as well as routine Mucinex.  11.  Diastolic CHF-at this point will monitor he is not complaining of any shortness of breath or cough apparently has been on PRN diuretics in the  past at this point will monitor weights.  .  12.-  Parkinson's disease he is followed by neurology at [facility] is on Sinemet.  #13 hypertension?-  Systolic is mildly elevated today at this point will monitor he is on Lopressor--suspect we will be somewhat conservative here secondary to his history of falls and Parkinson's disease  14.  History of anemia suspect there may be an element of chronic disease here this is borderline macrocytic however B12 and folate levels were unremarkable update lab is pending for tomorrow hemoglobin 8.3 was was up from 7.9-appears on admission his hemoglobin was in the lower mid nines.  Suspect there may be an element of chronic disease here will await updated labs Again will update lab work including a CBC and basic metabolic panel tomorrow continue to monitor  weights closely.  He has follow-up scheduled with neurosurgery as well as with urology-as well as with [facility] on January 10.    [id number] note greater than 50 minutes spent assessing patient-reviewing his chart and labs discussing his status with nursing staff as well as with his daughter at bedside- and coordinating and formulating a plan of care for numerous diagnoses-- of note greater than 50% of time spent coordinating a plan of care with input as noted above

## 2018-04-21 ENCOUNTER — Encounter (HOSPITAL_COMMUNITY)
Admission: RE | Admit: 2018-04-21 | Discharge: 2018-04-21 | Disposition: A | Discharge status: Home / Self Care | Payer: Medicare Other | Source: Skilled Nursing Facility | Attending: Internal Medicine | Admitting: Internal Medicine

## 2018-04-21 DIAGNOSIS — E43 Unspecified severe protein-calorie malnutrition: Secondary | ICD-10-CM | POA: Insufficient documentation

## 2018-04-21 DIAGNOSIS — E785 Hyperlipidemia, unspecified: Secondary | ICD-10-CM | POA: Insufficient documentation

## 2018-04-21 DIAGNOSIS — G9341 Metabolic encephalopathy: Secondary | ICD-10-CM | POA: Insufficient documentation

## 2018-04-21 DIAGNOSIS — G901 Familial dysautonomia [Riley-Day]: Secondary | ICD-10-CM | POA: Insufficient documentation

## 2018-04-21 LAB — CBC WITH DIFFERENTIAL/PLATELET
Abs Immature Granulocytes: 0.05 10*3/uL (ref 0.00–0.07)
BASOS PCT: 0 %
Basophils Absolute: 0 10*3/uL (ref 0.0–0.1)
Eosinophils Absolute: 0 10*3/uL (ref 0.0–0.5)
Eosinophils Relative: 0 %
HCT: 27.7 % — ABNORMAL LOW (ref 39.0–52.0)
Hemoglobin: 8.5 g/dL — ABNORMAL LOW (ref 13.0–17.0)
Immature Granulocytes: 1 %
Lymphocytes Relative: 9 %
Lymphs Abs: 1 10*3/uL (ref 0.7–4.0)
MCH: 30.5 pg (ref 26.0–34.0)
MCHC: 30.7 g/dL (ref 30.0–36.0)
MCV: 99.3 fL (ref 80.0–100.0)
Monocytes Absolute: 0.7 10*3/uL (ref 0.1–1.0)
Monocytes Relative: 7 %
Neutro Abs: 9.1 10*3/uL — ABNORMAL HIGH (ref 1.7–7.7)
Neutrophils Relative %: 83 %
PLATELETS: 294 10*3/uL (ref 150–400)
RBC: 2.79 MIL/uL — ABNORMAL LOW (ref 4.22–5.81)
RDW: 16.1 % — ABNORMAL HIGH (ref 11.5–15.5)
WBC: 10.8 10*3/uL — ABNORMAL HIGH (ref 4.0–10.5)
nRBC: 0 % (ref 0.0–0.2)

## 2018-04-21 LAB — BASIC METABOLIC PANEL
Anion gap: 6 (ref 5–15)
BUN: 50 mg/dL — ABNORMAL HIGH (ref 8–23)
CO2: 21 mmol/L — ABNORMAL LOW (ref 22–32)
Calcium: 9.4 mg/dL (ref 8.9–10.3)
Chloride: 111 mmol/L (ref 98–111)
Creatinine, Ser: 1.68 mg/dL — ABNORMAL HIGH (ref 0.61–1.24)
GFR calc Af Amer: 40 mL/min — ABNORMAL LOW (ref >60)
GFR calc non Af Amer: 34 mL/min — ABNORMAL LOW (ref >60)
Glucose, Bld: 98 mg/dL (ref 70–99)
Potassium: 4.3 mmol/L (ref 3.5–5.1)
Sodium: 138 mmol/L (ref 135–145)

## 2018-04-22 ENCOUNTER — Encounter: Payer: Self-pay | Admitting: Internal Medicine

## 2018-04-22 ENCOUNTER — Non-Acute Institutional Stay (SKILLED_NURSING_FACILITY): Payer: Medicare Other | Admitting: Internal Medicine

## 2018-04-22 DIAGNOSIS — R600 Localized edema: Secondary | ICD-10-CM | POA: Diagnosis not present

## 2018-04-22 DIAGNOSIS — N183 Chronic kidney disease, stage 3 unspecified: Secondary | ICD-10-CM

## 2018-04-22 DIAGNOSIS — I10 Essential (primary) hypertension: Secondary | ICD-10-CM

## 2018-04-22 DIAGNOSIS — I5032 Chronic diastolic (congestive) heart failure: Secondary | ICD-10-CM | POA: Diagnosis not present

## 2018-04-22 DIAGNOSIS — J431 Panlobular emphysema: Secondary | ICD-10-CM

## 2018-04-22 NOTE — Progress Notes (Signed)
Location:    [place] Room Number: 125/P Place of Service:  SNF [phone]) Provider:  Ewell Poe, MD  Patient Care Team: Kathyrn Drown, MD as PCP - General [facility] Medicine)  Extended Emergency Contact Information Primary Emergency Contact: [facility] Address: [address]          [place], [place] [address] [place] of [place] Phone: [phone] Mobile Phone: [phone] Relation: Son Secondary Emergency Contact: [facility] Address: [address]          [place], [place] [address] [place] Phone: [phone] Relation: Spouse  Code Status:  DNR Goals of care: Advanced Directive information Advanced Directives 04/22/2018  Does Patient Have a Medical Advance Directive? Yes  Type of Advance Directive Out of facility DNR (pink MOST or yellow form)  Does patient want to make changes to medical advance directive? No - Patient declined  Copy of [place] in Chart? -  Would patient like information on creating a medical advance directive? No - Patient declined  Pre-existing out of facility DNR order (yellow form or pink MOST form) -    Complaint-acute visit secondary to increased edema and weight gain.     HPI:  Pt is a 85 y.o. male seen today for an acute visit for increased edema and mild weight gain.  Patient is here for rehab after hospitalization for syncopal type episode with metabolic encephalopathy and recurrent falls.  He has a history of coronary artery disease as well as Parkinson's disease COPD diastolic CHF and horseshoe kidney as well as hypertension hyperlipidemia squamous cell lung cancer and progressing cognitive impairment.  He continues with an Designer, multimedia secondary to a odontoid fracture during a previous fall.  This is a confusion was caused by  several factors including opiates constipation urinary retention and dehydration.  With aggressive therapy apparently his  constipation improved he is on MiraLAX twice a day and Senokot 2 tabs a day.  He is also been treated with prednisone for acute gouty arthritis which appears to be improving.  He continues with a Foley catheter with recommendation to do a voiding trial in January 13.  At one point he had been on a as needed diuretic for increased lower extremity edema apparently he has gained about 3 pounds since his admission here and he appears to be having some increased edema-he is not complaining of any increased shortness of breath or cough.  Vital signs appear to be stable.           Past Medical History:  Diagnosis Date  . ASCVD (arteriosclerotic cardiovascular disease)     CABG in 04/1993; and negative stress nuclear study in 08/2001  . Benign essential tremor   . CAD (coronary artery disease)   . Cancer ([place])    skin  . Cerebrovascular disease    Right carotid bruit-40-69% left internal carotid artery stenosis in 4/06; followed [facility]  . Chronic kidney disease, stage II (mild)    Creatinine-1.6 in 09/2008  . COPD (chronic obstructive pulmonary disease) ([place])   . Degenerative joint disease of knee, left   . Gout   . Hiatal hernia   . Horseshoe kidney   . Hyperlipidemia   . Hypertension   . Lung cancer ([place]) 2013  . Normocytic anemia   . Prediabetes   . Pulmonary disease   . Renal insufficiency   . Syncope   . Tobacco abuse, in remission    40  pack years; discontinued in 1980   Past Surgical History:  Procedure Laterality Date  . CARDIAC SURGERY    . COLONOSCOPY W/ POLYPECTOMY  1985  . CORONARY ARTERY BYPASS GRAFT  1995  . LESION EXCISION    . PILONIDAL CYST EXCISION  1948    Allergies  Allergen Reactions  . Propranolol Nausea Only  . Levaquin [Levofloxacin In D5w] Hives and Nausea And Vomiting  . Penicillins Hives and Swelling    Patient has tolerated cephalosporins several times  . Sulfonamide Derivatives Hives and Swelling  . Sympathomimetics Other (See Comments)    . Tape Other (See Comments)    SKIN IS VERY THIN AND TEARS EASILY; PLEASE USE AN ALTERNATIVE TO TAPE!!    Outpatient Encounter Medications as of 04/22/2018  Medication Sig  . albuterol (PROVENTIL) (2.5 MG/3ML) 0.083% nebulizer solution Inhale 3 mLs into the lungs every 4 (four) hours as needed for wheezing or shortness of breath.  Marland Kitchen albuterol (VENTOLIN HFA) 108 (90 Base) MCG/ACT inhaler INHALE 2 PUFFS INTO THE LUNGS EVERY 6 HOURS AS NEEDED FOR SHORTNESS OF BREATH  . Artificial Tear Solution (SOOTHE XP) SOLN Place 1 drop into both eyes 2 (two) times daily as needed (for dry eyes).   Marland Kitchen aspirin EC 81 MG tablet Take 81 mg by mouth daily.  Marland Kitchen atorvastatin (LIPITOR) 40 MG tablet TAKE 1 TABLET (40 MG TOTAL) BY MOUTH DAILY.  Roseanne Kaufman Peru-Castor Oil (VENELEX) OINT Apply to sacrum and bilateral every shift prn for prevention  . budesonide-formoterol (SYMBICORT) 160-4.5 MCG/ACT inhaler INHALE 2 PUFF INTO THE LUNGS 2 TIMES DAILY.  . carbidopa-levodopa (SINEMET IR) 25-100 MG tablet Take three tablets by mouth three times daily  . feeding supplement, ENSURE ENLIVE, (ENSURE ENLIVE) LIQD Take 237 mLs by mouth 2 (two) times daily between meals.  Marland Kitchen guaiFENesin (MUCINEX) 600 MG 12 hr tablet Take 1 tablet (600 mg total) by mouth 2 (two) times daily.  . metoprolol tartrate (LOPRESSOR) 25 MG tablet TAKE 1 TABLET BY MOUTH TWICE A DAY  . Multiple Vitamins-Minerals (CENTRUM SILVER 50+MEN) TABS Take 1 tablet by mouth daily.  . nitroGLYCERIN (NITROSTAT) 0.4 MG SL tablet Place 1 tablet (0.4 mg total) under the tongue every 5 (five) minutes as needed for chest pain. May take up to 3 doses per episode.  . pantoprazole (PROTONIX) 40 MG tablet TAKE 1 TABLET BY MOUTH DAILY.  Marland Kitchen polyethylene glycol (MIRALAX / GLYCOLAX) packet Take 17 g by mouth 2 (two) times daily.  . predniSONE (DELTASONE) 50 MG tablet Take 1 tablet (50 mg total) by mouth daily with breakfast. X 3 days  . senna (SENOKOT) 8.6 MG TABS tablet Take 2 tablets (17.2  mg total) by mouth at bedtime.  . tamsulosin (FLOMAX) 0.4 MG CAPS capsule TAKE ONE CAPSULE BY MOUTH AT BEDTIME.  Marland Kitchen tiotropium (SPIRIVA) 18 MCG inhalation capsule Place 1 capsule (18 mcg total) into inhaler and inhale daily.   No facility-administered encounter medications on file as of 04/22/2018.     Review of Systems   This is somewhat limited secondary to patient being a poor historian.  General is not complaining of fever or chills.  Skin does not complain of diaphoresis does have some small skin tears and abrasions.  Head ears eyes nose mouth and throat is not complaining of any sore throat or visual changes.  He continues with a neck collar.  Respiratory does not complain of shortness of breath or increased cough he does have a history of COPD.  Cardiac does  not complain of chest pain has had what appears to be some creased lower extremity edema compared to previous exam.  GI is not complaining of abdominal pain nausea vomiting diarrhea constipation.  GU does not complain of dysuria.  Musculoskeletal does not complain of joint pain continues to have some weakness apparently the gout pain has improved some in his feet.  Neurologic does not complain of dizziness headache or syncope at this time he does have a history of Parkinson's which was thought to be contributing to his falls  And psych not complaining of depression or anxiety has had some progressive cognitive decline    Immunization History  Administered Date(s) Administered  . Influenza Split 01/10/2013  . Influenza,inj,Quad PF,6+ Mos 01/10/2014, 01/07/2015, 01/09/2016, 02/09/2017, 12/14/2017  . Influenza-Unspecified 01/12/2012  . Pneumococcal Conjugate-13 10/27/2013  . Pneumococcal Polysaccharide-23 01/11/2001, 07/13/2007  . Td 11/26/2004, 05/23/2015  . Tdap 11/22/2014  . Zoster 02/28/2008  . Zoster Recombinat (Shingrix) 03/18/2018   Pertinent  Health Maintenance Due  Topic Date Due  . INFLUENZA VACCINE   Completed  . PNA vac Low Risk Adult  Completed   Fall Risk  09/06/2015 01/07/2015 04/20/2014 09/19/2013  Falls in the past year? No No Yes No  Number falls in past yr: - - 2 or more -  Risk Factor Category  - - High Fall Risk -  Risk for fall due to : - - History of fall(s);Impaired mobility -   Functional Status Survey:    Vitals:   04/22/18 1616  BP: (!) 157/67  Pulse: (!) 58  Resp: 18  Temp: (!) 96.2 F (35.7 C)  TempSrc: Oral  SpO2: 98%  Weight: 141 lb 9.6 oz (64.2 kg)   Body mass index is 22.18 kg/m. Physical Exam   In general this is a pleasant elderly male in no distress resting comfortably in bed.  His skin is warm and dry.  Eyes visual acuity appears to be intact sclera and conjunctive are clear.  Oropharynx is clear mucous membranes moist he has numerous extractions.  Chest has some slight rhonchi on expiration there is no labored breathing.  Heart is regular rate and rhythm without murmur gallop or rub he appears to have some increased lower extremity edema from previous exam I would say this is moderate.  This is bilateral and at this point appears fairly equal.  Pedal edema appears to be at baseline  Musculoskeletal- appears to be able to move all extremities x4 at baseline this is limited since he is in bed-.  Neurologic is grossly intact his speech is clear could not really appreciate lateralizing findings.  Psych he is pleasant appropriate follow simple verbal commands does not speak much.     Labs reviewed: Recent Labs    05/10/17 1045  10/29/17 1112  04/17/18 0640 04/18/18 0542 04/21/18 0700  NA  --    < > 139   < > 139 138 138  K  --    < > 4.1   < > 3.8 4.0 4.3  CL  --    < > 103   < > 111 115* 111  CO2  --    < > 25   < > 22 21* 21*  GLUCOSE  --    < > 124*   < > 97 106* 98  BUN  --    < > 57*   < > 33* 31* 50*  CREATININE  --    < > 2.20*   < >  1.75* 1.74* 1.68*  CALCIUM 8.6   < > 9.1   < > 8.6* 8.4* 9.4  MG 2.5*  --  2.2  --   --    --   --    < > = values in this interval not displayed.   Recent Labs    03/16/18 0836 03/27/18 0828 04/14/18 0832  AST 16 22 20   ALT 4 5 5   ALKPHOS 83 72 81  BILITOT 0.4 0.9 0.5  PROT 5.6* 6.3* 6.2*  ALBUMIN 3.6 3.3* 3.3*   Recent Labs    03/27/18 0828 04/14/18 0832  04/16/18 0621 04/17/18 0640 04/21/18 0700  WBC 6.2 9.1   < > 10.4 11.2* 10.8*  NEUTROABS 4.1 7.0  --   --   --  9.1*  HGB 9.3* 9.4*   < > 7.9* 8.3* 8.5*  HCT 30.3* 30.1*   < > 26.0* 27.3* 27.7*  MCV 97.7 98.0   < > 98.1 98.9 99.3  PLT 193 290   < > 238 241 294   < > = values in this interval not displayed.   Lab Results  Component Value Date   TSH 7.565 (H) 04/14/2018   Lab Results  Component Value Date   HGBA1C 5.3 01/07/2015   Lab Results  Component Value Date   CHOL 152 03/16/2018   HDL 81 03/16/2018   LDLCALC 62 03/16/2018   TRIG 45 03/16/2018   CHOLHDL 1.9 03/16/2018    Significant Diagnostic Results in last 30 days:  Dg Chest 2 View  Result Date: 04/14/2018 CLINICAL DATA:  Weakness and syncope. Fell today. History of lung cancer. EXAM: CHEST - 2 VIEW COMPARISON:  Multiple prior chest x-rays from 2019 and 2018. FINDINGS: The cardiac silhouette, mediastinal and hilar contours are within normal limits and stable. Moderate tortuosity and calcification of the thoracic aorta is again demonstrated. Prior bypass surgery. Chronic left apical scarring changes and probable radiation changes. Underlying emphysema. No acute overlying pulmonary findings. Chronic right basilar scarring changes and pleural thickening. The bony thorax is intact. IMPRESSION: Chronic emphysematous changes, pulmonary scarring and left upper lobe scarring and radiation. No acute overlying pulmonary process. Electronically Signed   By: Marijo Sanes M.D.   On: 04/14/2018 08:10   Dg Cervical Spine Complete  Result Date: 03/27/2018 CLINICAL DATA:  Blacked out.  Fall. EXAM: CERVICAL SPINE - COMPLETE 4+ VIEW COMPARISON:  CT scan  March 10, 2011 FINDINGS: Dense calcifications in the neck or consistent with carotid calcifications. Minimal anterolisthesis of C2 versus C3 is unchanged and likely degenerative. Minimal anterolisthesis of C7 versus T1 is stable as well. No other malalignment identified. No fractures are seen. Multilevel degenerative disc disease. The neural foramina are patent. Chronic pleuroparenchymal thickening in the left apex is stable since at least December 2018. The superior most sternal wire is fractured, also stable. A dedicated odontoid view is limited. IMPRESSION: 1. This study is significantly limited due to positioning and osteopenia. No definitive fracture or traumatic malalignment. However, if there is clinical concern for cervical spine fracture, a CT scan is recommended. 2. Dense carotid calcifications. 3. Chronic changes in the left apex of the lung. Electronically Signed   By: Dorise Bullion III M.D   On: 03/27/2018 09:16   Ct Head Wo Contrast  Result Date: 04/14/2018 CLINICAL DATA:  Tremors and altered mental status EXAM: CT HEAD WITHOUT CONTRAST TECHNIQUE: Contiguous axial images were obtained from the base of the skull through the vertex without intravenous contrast. COMPARISON:  March 27, 2018 FINDINGS: Brain: Mild diffuse atrophy is stable. There is no intracranial mass, hemorrhage, extra-axial fluid collection, or midline shift. There is patchy small vessel disease in the centra semiovale bilaterally. There is an apparent old infarct in the peripheral left temporal-occipital region, stable. No acute infarct is appreciable. Vascular: There is no evident vascular calcification. There is calcification in each distal vertebral artery and carotid siphon region. Skull: Bony calvarium appears intact. Sinuses/Orbits: There is mucosal thickening in multiple ethmoid air cells bilaterally. Other visualized paranasal sinuses are clear. Orbits appear symmetric bilaterally. Other: Mastoid air cells are clear.  IMPRESSION: Atrophy with supratentorial small vessel disease. Prior left temporal-occipital infarct. No acute infarct. No mass or hemorrhage. There are foci of arterial vascular calcification. There is mucosal thickening in several ethmoid air cells. Electronically Signed   By: Lowella Grip III M.D.   On: 04/14/2018 10:30   Ct Head Wo Contrast  Result Date: 03/27/2018 CLINICAL DATA:  Fall after loss of consciousness.  Facial trauma. EXAM: CT HEAD WITHOUT CONTRAST CT MAXILLOFACIAL WITHOUT CONTRAST TECHNIQUE: Multidetector CT imaging of the head and maxillofacial structures were performed using the standard protocol without intravenous contrast. Multiplanar CT image reconstructions of the maxillofacial structures were also generated. COMPARISON:  CT scan of July 28, 2016. FINDINGS: CT HEAD FINDINGS Brain: Mild diffuse cortical atrophy is noted. Mild chronic ischemic white matter disease is noted. No mass effect or midline shift is noted. Ventricular size is within normal limits. There is no evidence of mass lesion, hemorrhage or acute infarction. Vascular: No hyperdense vessel or unexpected calcification. Skull: Normal. Negative for fracture or focal lesion. Other: None. CT MAXILLOFACIAL FINDINGS Osseous: Mildly displaced type 2 odontoid fracture of C2 is noted. The mandible is unremarkable. No other bony abnormality is noted in maxillofacial region. Orbits: Negative. No traumatic or inflammatory finding. Sinuses: Clear. Soft tissues: Negative. IMPRESSION: Mild diffuse cortical atrophy. Mild chronic ischemic white matter disease. No acute intracranial abnormality seen. Mildly displaced type 2 odontoid fracture of C2 is noted. Critical Value/emergent results were called by telephone at the time of interpretation on 03/27/2018 at 9:29 am to Dr. Kris Mouton, who verbally acknowledged these results. No definite abnormality seen in maxillofacial region. Electronically Signed   By: Marijo Conception, M.D.   On:  03/27/2018 09:30   Ct Cervical Spine Wo Contrast  Result Date: 03/27/2018 CLINICAL DATA:  Was walking today and suffered a loss of consciousness, fall, multiple lacerations to face; history lung cancer, hypertension, coronary artery disease, former smoker; abnormal CT facial bones EXAM: CT CERVICAL SPINE WITHOUT CONTRAST TECHNIQUE: Multidetector CT imaging of the cervical spine was performed without intravenous contrast. Multiplanar CT image reconstructions were also generated. COMPARISON:  03/10/2011 CT cervical spine, chest radiograph 11/22/2017 FINDINGS: Alignment: Mild anterolisthesis at C7-T1, unchanged. Remaining alignments normal Skull base and vertebrae: Diffuse osseous demineralization. Visualized skull base intact. Nondisplaced type II odontoid fracture, minimally oblique anterior to posterior. Prominent calcified pannus posterior to the odontoid process. Vertebral body heights maintained. No additional fracture, subluxation or bone destruction. Multilevel disc space narrowing and endplate spur formation. Multilevel facet degenerative changes bilaterally. Soft tissues and spinal canal: Prevertebral soft tissues normal thickness. Dense atherosclerotic calcifications aorta. Disc levels:  No additional abnormalities Upper chest: RIGHT lung apex clear. Opacified LEFT lung apex, corresponding to pleural fluid/thickening on prior chest radiographs, question sequela of treatment of lung cancer. Other: N/A IMPRESSION: Nondisplaced minimally oblique type II odontoid fracture. Osseous demineralization with multilevel degenerative disc and facet disease changes of the  cervical spine. Pleural scarring and effusion at LEFT apex unchanged since prior chest radiograph, question sequela of treatment for lung cancer. Electronically Signed   By: Lavonia Dana M.D.   On: 03/27/2018 10:27   Ct Abdomen Pelvis W Contrast  Result Date: 04/14/2018 CLINICAL DATA:  Abdominal pain, acute, generalized. EXAM: CT ABDOMEN AND  PELVIS WITH CONTRAST TECHNIQUE: Multidetector CT imaging of the abdomen and pelvis was performed using the standard protocol following bolus administration of intravenous contrast. CONTRAST:  70mL OMNIPAQUE IOHEXOL 300 MG/ML  SOLN COMPARISON:  10/29/2017 FINDINGS: Lower chest: Moderate sliding hiatal hernia. Extensive coronary calcification. Scarring in the right lower lobe with volume loss and mild bronchiectasis. Mild centrilobular emphysema. Hepatobiliary: No focal liver abnormality.No evidence of biliary obstruction or stone. Pancreas: Unremarkable. Spleen: Unremarkable. Adrenals/Urinary Tract: Negative adrenals. Horseshoe kidney. Bilateral hydroureteronephrosis likely from the very distended bladder which is likely patulous at baseline due to the partially folded appearance. Stomach/Bowel: Formed stool throughout much of the colon, most notably the rectum which is distended to 10 cm and fills the pelvis. No evident bowel inflammation. The appendix is difficult to discretely visualize. Vascular/Lymphatic: Extensive atherosclerotic calcification with high-grade bilateral common femoral and superficial femoral stenosis. No mass or adenopathy. Reproductive:Negative Other: No ascites or pneumoperitoneum. Musculoskeletal: Advanced spinal degeneration. No acute osseous finding. IMPRESSION: 1. Constipation with rectal impaction and distention to 10 cm. 2. Dilated bladder, possibly related to#1, with bilateral hydronephrosis of the horseshoe kidneys. 3. Chronic findings are described above. Electronically Signed   By: Monte Fantasia M.D.   On: 04/14/2018 10:30   US Renal  Result Date: 04/15/2018 CLINICAL DATA:  Hydronephrosis. EXAM: RENAL / URINARY TRACT ULTRASOUND COMPLETE COMPARISON:  CT abdomen pelvis from yesterday. FINDINGS: Right Kidney: Renal measurements: 10.4 x 4.4 x 4.6 cm = volume: 108 mL . Echogenicity within normal limits. Mild right hydronephrosis is unchanged. No mass visualized. Left Kidney: Renal  measurements: 10.6 x 4.2 x 3.7 cm = volume: 86 mL. Echogenicity within normal limits. No mass or hydronephrosis visualized. Bladder: Decompressed by Foley catheter. IMPRESSION: 1. Unchanged mild right hydronephrosis. Resolved left hydronephrosis. Electronically Signed   By: Titus Dubin M.D.   On: 04/15/2018 12:22   Ct Maxillofacial Wo Cm  Result Date: 03/27/2018 CLINICAL DATA:  Fall after loss of consciousness.  Facial trauma. EXAM: CT HEAD WITHOUT CONTRAST CT MAXILLOFACIAL WITHOUT CONTRAST TECHNIQUE: Multidetector CT imaging of the head and maxillofacial structures were performed using the standard protocol without intravenous contrast. Multiplanar CT image reconstructions of the maxillofacial structures were also generated. COMPARISON:  CT scan of July 28, 2016. FINDINGS: CT HEAD FINDINGS Brain: Mild diffuse cortical atrophy is noted. Mild chronic ischemic white matter disease is noted. No mass effect or midline shift is noted. Ventricular size is within normal limits. There is no evidence of mass lesion, hemorrhage or acute infarction. Vascular: No hyperdense vessel or unexpected calcification. Skull: Normal. Negative for fracture or focal lesion. Other: None. CT MAXILLOFACIAL FINDINGS Osseous: Mildly displaced type 2 odontoid fracture of C2 is noted. The mandible is unremarkable. No other bony abnormality is noted in maxillofacial region. Orbits: Negative. No traumatic or inflammatory finding. Sinuses: Clear. Soft tissues: Negative. IMPRESSION: Mild diffuse cortical atrophy. Mild chronic ischemic white matter disease. No acute intracranial abnormality seen. Mildly displaced type 2 odontoid fracture of C2 is noted. Critical Value/emergent results were called by telephone at the time of interpretation on 03/27/2018 at 9:29 am to Dr. Kris Mouton, who verbally acknowledged these results. No definite abnormality seen in maxillofacial region.  Electronically Signed   By: Marijo Conception, M.D.   On: 03/27/2018  09:30    Assessment/Plan  #1-history of increased lower extremity edema with some weight gain- I did discuss this with his son as well as review of previous medical records and he has received PRN diuretics- secondary to increased edema will give him a 40 mg dose of Lasix tonight and start 20 mg daily tomorrow-monitor weights daily notify provider of gain greater than 3 pounds.  Also will add potassium 20 mEq tonight and 10 mEq starting tomorrow.  Will update a metabolic panel on Monday, January 13.  Also monitor vital signs pulse ox every shift and will obtain a chest x-ray-.  2.  History of hypertension--he is on Lopressor-systolic continues to be somewhat elevated today- I suspect the diuretic may bring this down-at this point will monitor.  3.  History of chronic kidney disease this appears to have gradually improved during his hospitalization now that he is on a diuretic this will have to be watched closely will u--last creatinine appear to be trending down at 1.68 on January 9.--- Will update this on Monday, January 13  4.  History of anemia suspect there is an element of chronic disease but is borderline macrocytic B12 and folate levels were unremarkable in the hospital- hemoglobin has shown stability at 8.5 will update this on Monday, January 13  #5 history of COPD continues on nebulizers Symbicort and inhalers as well as routine Mucinex-at this point will monitor he does not show signs of shortness of breath or increased cough from baseline- has some mild rhonchi on exam   #6 gout this appears to have improved on prednisone at this point will monitor foot pain appears to have improved  [id number]

## 2018-04-25 ENCOUNTER — Encounter: Payer: Self-pay | Admitting: Internal Medicine

## 2018-04-25 ENCOUNTER — Non-Acute Institutional Stay (SKILLED_NURSING_FACILITY): Payer: Medicare Other | Admitting: Internal Medicine

## 2018-04-25 ENCOUNTER — Other Ambulatory Visit (HOSPITAL_COMMUNITY)
Admission: RE | Admit: 2018-04-25 | Discharge: 2018-04-25 | Disposition: A | Discharge status: Home / Self Care | Payer: Medicare Other | Source: Skilled Nursing Facility | Attending: Family Medicine | Admitting: Family Medicine

## 2018-04-25 DIAGNOSIS — R296 Repeated falls: Secondary | ICD-10-CM

## 2018-04-25 DIAGNOSIS — R338 Other retention of urine: Secondary | ICD-10-CM | POA: Diagnosis not present

## 2018-04-25 DIAGNOSIS — I5032 Chronic diastolic (congestive) heart failure: Secondary | ICD-10-CM | POA: Insufficient documentation

## 2018-04-25 DIAGNOSIS — N183 Chronic kidney disease, stage 3 unspecified: Secondary | ICD-10-CM

## 2018-04-25 DIAGNOSIS — J431 Panlobular emphysema: Secondary | ICD-10-CM | POA: Diagnosis not present

## 2018-04-25 DIAGNOSIS — G20 Parkinson's disease: Secondary | ICD-10-CM

## 2018-04-25 DIAGNOSIS — D649 Anemia, unspecified: Secondary | ICD-10-CM

## 2018-04-25 LAB — BASIC METABOLIC PANEL
Anion gap: 5 (ref 5–15)
BUN: 52 mg/dL — ABNORMAL HIGH (ref 8–23)
CO2: 24 mmol/L (ref 22–32)
Calcium: 8.5 mg/dL — ABNORMAL LOW (ref 8.9–10.3)
Chloride: 108 mmol/L (ref 98–111)
Creatinine, Ser: 1.63 mg/dL — ABNORMAL HIGH (ref 0.61–1.24)
GFR calc Af Amer: 41 mL/min — ABNORMAL LOW (ref >60)
GFR calc non Af Amer: 36 mL/min — ABNORMAL LOW (ref >60)
Glucose, Bld: 81 mg/dL (ref 70–99)
Potassium: 4.4 mmol/L (ref 3.5–5.1)
Sodium: 137 mmol/L (ref 135–145)

## 2018-04-25 LAB — CBC WITH DIFFERENTIAL/PLATELET
Abs Immature Granulocytes: 0.06 10*3/uL (ref 0.00–0.07)
Basophils Absolute: 0 10*3/uL (ref 0.0–0.1)
Basophils Relative: 0 %
EOS ABS: 0.1 10*3/uL (ref 0.0–0.5)
Eosinophils Relative: 1 %
HCT: 25.9 % — ABNORMAL LOW (ref 39.0–52.0)
Hemoglobin: 8.1 g/dL — ABNORMAL LOW (ref 13.0–17.0)
Immature Granulocytes: 1 %
LYMPHS ABS: 1.1 10*3/uL (ref 0.7–4.0)
Lymphocytes Relative: 9 %
MCH: 30.2 pg (ref 26.0–34.0)
MCHC: 31.3 g/dL (ref 30.0–36.0)
MCV: 96.6 fL (ref 80.0–100.0)
Monocytes Absolute: 0.8 10*3/uL (ref 0.1–1.0)
Monocytes Relative: 7 %
Neutro Abs: 9.5 10*3/uL — ABNORMAL HIGH (ref 1.7–7.7)
Neutrophils Relative %: 82 %
Platelets: 274 10*3/uL (ref 150–400)
RBC: 2.68 MIL/uL — ABNORMAL LOW (ref 4.22–5.81)
RDW: 16 % — AB (ref 11.5–15.5)
WBC: 11.6 10*3/uL — ABNORMAL HIGH (ref 4.0–10.5)
nRBC: 0 % (ref 0.0–0.2)

## 2018-04-25 NOTE — Progress Notes (Signed)
Provider:  Veleta Miners MD Location:    [facility] Room Number: 125/P Place of Service:  SNF (31)  PCP: Kathyrn Drown, MD Patient Care Team: Kathyrn Drown, MD as PCP - General (Family Medicine)  Extended Emergency Contact Information Primary Emergency Contact: [facility] Address: [address]          [place], [place] [address] [place] Phone: [phone] Mobile Phone: [phone] Relation: Son Secondary Emergency Contact: [facility] Address: [address]          [place], [place] [address] [place] Phone: [phone] Relation: Spouse  Code Status: DNR Goals of Care: Advanced Directive information Advanced Directives 04/25/2018  Does Patient Have a Medical Advance Directive? Yes  Type of Advance Directive Out of facility DNR (pink MOST or yellow form)  Does patient want to make changes to medical advance directive? No - Patient declined  Copy of [place] in Chart? -  Would patient like information on creating a medical advance directive? No - Patient declined  Pre-existing out of facility DNR order (yellow form or pink MOST form) -      Chief Complaint  Patient presents with  . New Admit To SNF    New Admission Visit    HPI: Patient is a 85 y.o. male seen today for admission to SNF for therapy.  Patient stayed in the hospital from 1/02 01/07 for recurrent  falls and metabolic encephalopathy. Patient has a history of coronary artery disease status post CABG, COPD, Parkinson disease, chronic diastolic CHF, hypertension, hyperlipidemia, squamous cell lung cancer status post resection and cognitive impairment. He and his wife live with his son.  Patient unable to give me detailed history.  Per his son patient has been falling for last few weeks.  1 of these falls he sustained odontoid fracture.  He was sent home with Aspen collar.  Patient son brought him back to ED because he kept on  having recurrent falls/questionable syncope's. His detailed work-up in the hospital in CT scan and echo did not show any acute processes.  Patient did have acute urinary retention and severe constipation.  He was treated for both and had a Foley catheter placed. He is now in SNF for therapy. Patient's son takes care of both his mom and dad and is there with them all the time.  Patient was walking with a walker at home.  In spite of that he was having falls. In the Facility patient was noticed to have increase in his weight, cough and shortness of breath.  His x-ray showed CHF and he was started on Lasix. He continues to have some cough but no fever or chills.   Past Medical History:  Diagnosis Date  . ASCVD (arteriosclerotic cardiovascular disease)     CABG in 04/1993; and negative stress nuclear study in 08/2001  . Benign essential tremor   . CAD (coronary artery disease)   . Cancer ([place])    skin  . Cerebrovascular disease    Right carotid bruit-40-69% left internal carotid artery stenosis in 4/06; followed [facility]  . Chronic kidney disease, stage II (mild)    Creatinine-1.6 in 09/2008  . COPD (chronic obstructive pulmonary disease) ([facility])   . Degenerative joint disease of knee, left   . Gout   . Hiatal hernia   . Horseshoe kidney   . Hyperlipidemia   . Hypertension   . Lung cancer ([facility]) 2013  . Normocytic anemia   .  Prediabetes   . Pulmonary disease   . Renal insufficiency   . Syncope   . Tobacco abuse, in remission    40 pack years; discontinued in 1980   Past Surgical History:  Procedure Laterality Date  . CARDIAC SURGERY    . COLONOSCOPY W/ POLYPECTOMY  1985  . CORONARY ARTERY BYPASS GRAFT  1995  . LESION EXCISION    . PILONIDAL CYST EXCISION  1948    reports that he quit smoking about 44 years ago. His smoking use included cigarettes. He has a 40.00 pack-year smoking history. He quit smokeless tobacco use about 37 years ago.  His smokeless tobacco use included chew. He  reports that he does not drink alcohol or use drugs. Social History   Socioeconomic History  . Marital status: Married    Spouse name: Not on file  . Number of children: 3  . Years of education: Not on file  . Highest education level: Not on file  Occupational History  . Occupation: retired    Comment: Clinical biochemist with [employer]  Social Needs  . Financial resource strain: Not hard at all  . Food insecurity:    Worry: Patient refused    Inability: Patient refused  . Transportation needs:    Medical: Patient refused    Non-medical: Patient refused  Tobacco Use  . Smoking status: Former Smoker    Packs/day: 1.00    Years: 40.00    Pack years: 40.00    Types: Cigarettes    Last attempt to quit: 08/14/1973    Years since quitting: 44.7  . Smokeless tobacco: Former Systems developer    Types: [place] date: 12/05/1980  Substance and Sexual Activity  . Alcohol use: No    Alcohol/week: 0.0 standard drinks  . Drug use: No  . Sexual activity: Not Currently  Lifestyle  . Physical activity:    Days per week: Patient refused    Minutes per session: Patient refused  . Stress: Not on file  Relationships  . Social connections:    Talks on phone: Patient refused    Gets together: Patient refused    Attends religious service: Patient refused    Active member of club or organization: Patient refused    Attends meetings of clubs or organizations: Patient refused    Relationship status: Patient refused  . Intimate partner violence:    Fear of current or ex partner: Patient refused    Emotionally abused: Patient refused    Physically abused: Patient refused    Forced sexual activity: Patient refused  Other Topics Concern  . Not on file  Social History Narrative  . Not on file    Functional Status Survey:    Family History  Problem Relation Age of Onset  . Cancer Brother   . Heart attack Brother     Health Maintenance  Topic Date Due  . Samul Dada  05/22/2025  . INFLUENZA  VACCINE  Completed  . PNA vac Low Risk Adult  Completed    Allergies  Allergen Reactions  . Propranolol Nausea Only  . Levaquin [Levofloxacin In D5w] Hives and Nausea And Vomiting  . Penicillins Hives and Swelling    Patient has tolerated cephalosporins several times  . Sulfonamide Derivatives Hives and Swelling  . Sympathomimetics Other (See Comments)  . Tape Other (See Comments)    SKIN IS VERY THIN AND TEARS EASILY; PLEASE USE AN ALTERNATIVE TO TAPE!!    Outpatient Encounter Medications as of 04/25/2018  Medication  Sig  . albuterol (PROVENTIL) (2.5 MG/3ML) 0.083% nebulizer solution Inhale 3 mLs into the lungs every 4 (four) hours as needed for wheezing or shortness of breath.  Marland Kitchen albuterol (VENTOLIN HFA) 108 (90 Base) MCG/ACT inhaler INHALE 2 PUFFS INTO THE LUNGS EVERY 6 HOURS AS NEEDED FOR SHORTNESS OF BREATH  . Artificial Tear Solution (SOOTHE XP) SOLN Place 1 drop into both eyes 2 (two) times daily as needed (for dry eyes).   Marland Kitchen aspirin EC 81 MG tablet Take 81 mg by mouth daily.  Marland Kitchen atorvastatin (LIPITOR) 40 MG tablet TAKE 1 TABLET (40 MG TOTAL) BY MOUTH DAILY.  Roseanne Kaufman [place]-Castor Oil (VENELEX) OINT Apply to sacrum and bilateral every shift prn for prevention  . budesonide-formoterol (SYMBICORT) 160-4.5 MCG/ACT inhaler INHALE 2 PUFF INTO THE LUNGS 2 TIMES DAILY.  . carbidopa-levodopa (SINEMET IR) 25-100 MG tablet Take three tablets by mouth three times daily  . feeding supplement, ENSURE ENLIVE, (ENSURE ENLIVE) LIQD Take 237 mLs by mouth 2 (two) times daily between meals.  . furosemide (LASIX) 20 MG tablet Take 20 mg by mouth daily.  Marland Kitchen guaiFENesin (MUCINEX) 600 MG 12 hr tablet Take 1 tablet (600 mg total) by mouth 2 (two) times daily.  . metoprolol tartrate (LOPRESSOR) 25 MG tablet TAKE 1 TABLET BY MOUTH TWICE A DAY  . Multiple Vitamins-Minerals (CENTRUM SILVER 50+MEN) TABS Take 1 tablet by mouth daily.  . nitroGLYCERIN (NITROSTAT) 0.4 MG SL tablet Place 1 tablet (0.4 mg total)  under the tongue every 5 (five) minutes as needed for chest pain. May take up to 3 doses per episode.  . pantoprazole (PROTONIX) 40 MG tablet TAKE 1 TABLET BY MOUTH DAILY.  Marland Kitchen polyethylene glycol (MIRALAX / GLYCOLAX) packet Take 17 g by mouth 2 (two) times daily.  . potassium chloride (K-DUR,KLOR-CON) 10 MEQ tablet Take 10 mEq by mouth daily.  Marland Kitchen senna (SENOKOT) 8.6 MG TABS tablet Take 2 tablets (17.2 mg total) by mouth at bedtime.  . tamsulosin (FLOMAX) 0.4 MG CAPS capsule TAKE ONE CAPSULE BY MOUTH AT BEDTIME.  Marland Kitchen tiotropium (SPIRIVA) 18 MCG inhalation capsule Place 1 capsule (18 mcg total) into inhaler and inhale daily.  . [DISCONTINUED] predniSONE (DELTASONE) 50 MG tablet Take 1 tablet (50 mg total) by mouth daily with breakfast. X 3 days   No facility-administered encounter medications on file as of 04/25/2018.      Review of Systems  Constitutional: Negative.   HENT: Negative.   Respiratory: Positive for cough and shortness of breath.   Cardiovascular: Positive for leg swelling.  Gastrointestinal: Positive for constipation.  Genitourinary: Positive for difficulty urinating.  Musculoskeletal: Negative.   Skin: Negative.   Neurological: Positive for weakness.  Psychiatric/Behavioral: Negative.     Vitals:   04/25/18 1505  BP: 106/67  Pulse: 65  Resp: 18  Temp: (!) 97.5 F (36.4 C)  TempSrc: Oral  SpO2: 98%   There is no height or weight on file to calculate BMI. Physical Exam Constitutional:      Appearance: Normal appearance.  HENT:     Head: Normocephalic.     Nose: Nose normal.     Mouth/Throat:     Mouth: Mucous membranes are moist.     Pharynx: Oropharynx is clear.  Eyes:     Pupils: Pupils are equal, round, and reactive to light.  Neck:     Musculoskeletal: Neck supple.  Cardiovascular:     Rate and Rhythm: Normal rate and regular rhythm.     Pulses: Normal pulses.  Heart sounds: Normal heart sounds.  Pulmonary:     Effort: Pulmonary effort is normal. No  respiratory distress.     Breath sounds: Normal breath sounds. No wheezing or rales.  Abdominal:     General: Abdomen is flat. Bowel sounds are normal. There is no distension.     Palpations: Abdomen is soft.     Tenderness: There is no abdominal tenderness. There is no guarding.  Musculoskeletal:     Comments: Has Moderate edema Bilateral  Skin:    General: Skin is warm.  Neurological:     General: No focal deficit present.     Mental Status: He is alert.     Comments: Was oriented to time and knew his Age. Thought he is in [facility] in Hospital  Psychiatric:        Mood and Affect: Mood normal.        Behavior: Behavior normal.        Thought Content: Thought content normal.     Labs reviewed: Basic Metabolic Panel: Recent Labs    05/10/17 1045  10/29/17 1112  04/18/18 0542 04/21/18 0700 04/25/18 0839  NA  --    < > 139   < > 138 138 137  K  --    < > 4.1   < > 4.0 4.3 4.4  CL  --    < > 103   < > 115* 111 108  CO2  --    < > 25   < > 21* 21* 24  GLUCOSE  --    < > 124*   < > 106* 98 81  BUN  --    < > 57*   < > 31* 50* 52*  CREATININE  --    < > 2.20*   < > 1.74* 1.68* 1.63*  CALCIUM 8.6   < > 9.1   < > 8.4* 9.4 8.5*  MG 2.5*  --  2.2  --   --   --   --    < > = values in this interval not displayed.   Liver Function Tests: Recent Labs    03/16/18 0836 03/27/18 0828 04/14/18 0832  AST 16 22 20   ALT 4 5 5   ALKPHOS 83 72 81  BILITOT 0.4 0.9 0.5  PROT 5.6* 6.3* 6.2*  ALBUMIN 3.6 3.3* 3.3*   Recent Labs    10/29/17 1112  LIPASE 27   Recent Labs    04/14/18 1357  AMMONIA 9   CBC: Recent Labs    04/14/18 0832  04/17/18 0640 04/21/18 0700 04/25/18 0839  WBC 9.1   < > 11.2* 10.8* 11.6*  NEUTROABS 7.0  --   --  9.1* 9.5*  HGB 9.4*   < > 8.3* 8.5* 8.1*  HCT 30.1*   < > 27.3* 27.7* 25.9*  MCV 98.0   < > 98.9 99.3 96.6  PLT 290   < > 241 294 274   < > = values in this interval not displayed.   Cardiac Enzymes: Recent Labs    09/22/17 1947  10/29/17 1112 04/14/18 0832  TROPONINI <0.03 <0.03 <0.03   BNP: Invalid input(s): POCBNP Lab Results  Component Value Date   HGBA1C 5.3 01/07/2015   Lab Results  Component Value Date   TSH 7.565 (H) 04/14/2018   Lab Results  Component Value Date   VITAMINB12 1,236 (H) 04/14/2018   Lab Results  Component Value Date   FOLATE 23.8 04/14/2018   Lab  Results  Component Value Date   IRON 85 08/16/2009   TIBC 294 08/16/2009   FERRITIN 91 05/17/2014    Imaging and Procedures obtained prior to SNF admission: No results found.  Assessment/Plan  Diastolic CHF Patient has gained 7 to 8 pounds since he came to the facility His Lasix would be changed to Demadex per son's request Repeat BMP Recurrent falls most  like multifactorial Due to his Parkinson, failure to thrive and cognitive impairment We will start therapy Patient son wants him to be more independent before he can take him home S/p odontoid fracture Follow up with neurosurgery Continue the brace Parkinson's disease  Symptoms control on Sinemet  Acute urinary retention Added on Flomax Today the Foley catheter was removed Patient is doing well. He did have hydronephrosis due to obstruction on his ultrasound  CKD (chronic kidney disease), stage 4 We will repeat his BMP  Anemia,  Due to chronic disease and CKD We will get iron studies  CAD On his aspirin, beta-blocker and atorvastatin COPD Patient stable on Symbicort and Spiriva Constipation with impaction Continue Miralax and Senna    Family/ staff Communication:   Labs/tests ordered:  Total time spent in this patient care encounter was 45_ minutes; greater than 50% of the visit spent counseling patient, reviewing records , Labs and coordinating care for problems addressed at this encounter.

## 2018-04-26 ENCOUNTER — Non-Acute Institutional Stay (SKILLED_NURSING_FACILITY): Payer: Medicare Other | Admitting: Internal Medicine

## 2018-04-26 ENCOUNTER — Encounter: Payer: Self-pay | Admitting: Internal Medicine

## 2018-04-26 DIAGNOSIS — J431 Panlobular emphysema: Secondary | ICD-10-CM | POA: Diagnosis not present

## 2018-04-26 DIAGNOSIS — R338 Other retention of urine: Secondary | ICD-10-CM | POA: Diagnosis not present

## 2018-04-26 DIAGNOSIS — I5032 Chronic diastolic (congestive) heart failure: Secondary | ICD-10-CM

## 2018-04-26 DIAGNOSIS — H109 Unspecified conjunctivitis: Secondary | ICD-10-CM

## 2018-04-26 NOTE — Progress Notes (Signed)
Is an acute visit.  Level of care skilled.  Facility is [facility].  Chief complaint acute visit follow-up conjunctivitis- also edema-.  History of present illness.  Patient is a pleasant 85 year old male here for rehab after hospitalization for recurrent falls and fusion.  He has a history of coronary artery disease status post CABG as well as COPD Parkinson's disease chronic diastolic CHF as well as hypertension and hyperlipidemia in addition to squamous cell lung cancer status post resection and cognitive impairment.  He also has a history of a recent odontoid fracture--he does have a collar placed and will have neurology follow-up.  Patient went back to the ER on most recent admission with recurrent falls- questionable syncope- CT and echo did not show any acute process he had acute urinary retention and severe constipation.  He was treated for both and had a Foley catheter placed.  He actually had a trial done today without the catheter but was retaining urine about 4 to 500 cc so catheter will be replaced  Facility was noted to have some increased edema weight gain and shortness of breath x-ray showed CHF and he was started on Lasix- this has since been transitioned to [facility] which patient had previously been on.  This appears to have stabilized.  He also apparently had suspected conjunctivitis of his left eye yesterday and was started on Ocuflox- appears he does have an allergy to Levaquin so out of abundance of caution we have switch the eyedrop to gentamicin-his son says his eye actually looks better today better today.  Currently patient is resting in bed comfortably has no complaints but he appears to be a poor historian- vital signs appear to be stable as well as oxygen saturations   Past Medical History:  Diagnosis Date  . ASCVD (arteriosclerotic cardiovascular disease)     CABG in 04/1993; and negative stress nuclear study in 08/2001  . Benign essential tremor    . CAD (coronary artery disease)   . Cancer ([place])    skin  . Cerebrovascular disease    Right carotid bruit-40-69% left internal carotid artery stenosis in 4/06; followed [facility]  . Chronic kidney disease, stage II (mild)    Creatinine-1.6 in 09/2008  . COPD (chronic obstructive pulmonary disease) ([place])   . Degenerative joint disease of knee, left   . Gout   . Hiatal hernia   . Horseshoe kidney   . Hyperlipidemia   . Hypertension   . Lung cancer ([place]) 2013  . Normocytic anemia   . Prediabetes   . Pulmonary disease   . Renal insufficiency   . Syncope   . Tobacco abuse, in remission    40 pack years; discontinued in 1980        Past Surgical History:  Procedure Laterality Date  . CARDIAC SURGERY    . COLONOSCOPY W/ POLYPECTOMY  1985  . CORONARY ARTERY BYPASS GRAFT  1995  . LESION EXCISION    . PILONIDAL CYST EXCISION  1948    reports that he quit smoking about 44 years ago. His smoking use included cigarettes. He has a 40.00 pack-year smoking history. He quit smokeless tobacco use about 37 years ago.  His smokeless tobacco use included chew. He reports that he does not drink alcohol or use drugs. Social History        Socioeconomic History  . Marital status: Married    Spouse name: Not on file  . Number of children: 3  . Years of education: Not  on file  . Highest education level: Not on file  Occupational History  . Occupation: retired    Comment: Clinical biochemist with [employer]  Social Needs  . Financial resource strain: Not hard at all  . Food insecurity:    Worry: Patient refused    Inability: Patient refused  . Transportation needs:    Medical: Patient refused    Non-medical: Patient refused  Tobacco Use  . Smoking status: Former Smoker    Packs/day: 1.00    Years: 40.00    Pack years: 40.00    Types: Cigarettes    Last attempt to quit: 08/14/1973    Years since quitting: 44.7  . Smokeless tobacco: Former  Systems developer    Types: [place] date: 12/05/1980  Substance and Sexual Activity  . Alcohol use: No    Alcohol/week: 0.0 standard drinks  . Drug use: No  . Sexual activity: Not Currently  Lifestyle  . Physical activity:    Days per week: Patient refused    Minutes per session: Patient refused  . Stress: Not on file  Relationships  . Social connections:    Talks on phone: Patient refused    Gets together: Patient refused    Attends religious service: Patient refused    Active member of club or organization: Patient refused    Attends meetings of clubs or organizations: Patient refused    Relationship status: Patient refused  . Intimate partner violence:    Fear of current or ex partner: Patient refused    Emotionally abused: Patient refused    Physically abused: Patient refused    Forced sexual activity: Patient refused  Other Topics Concern  . Not on file  Social History Narrative  . Not on file    Functional Status Survey:       Family History  Problem Relation Age of Onset  . Cancer Brother   . Heart attack Brother         Health Maintenance  Topic Date Due  . Samul Dada  05/22/2025  . INFLUENZA VACCINE  Completed  . PNA vac Low Risk Adult  Completed         Allergies  Allergen Reactions  . Propranolol Nausea Only  . Levaquin [Levofloxacin In D5w] Hives and Nausea And Vomiting  . Penicillins Hives and Swelling    Patient has tolerated cephalosporins several times  . Sulfonamide Derivatives Hives and Swelling  . Sympathomimetics Other (See Comments)  . Tape Other (See Comments)    SKIN IS VERY THIN AND TEARS EASILY; PLEASE USE AN ALTERNATIVE TO TAPE!!      MEDICATIONS     Medication Sig  . albuterol (PROVENTIL) (2.5 MG/3ML) 0.083% nebulizer solution Inhale 3 mLs into the lungs every 4 (four) hours as needed for wheezing or shortness of breath.  Marland Kitchen albuterol (VENTOLIN HFA) 108 (90 Base) MCG/ACT inhaler INHALE 2  PUFFS INTO THE LUNGS EVERY 6 HOURS AS NEEDED FOR SHORTNESS OF BREATH  . Artificial Tear Solution (SOOTHE XP) SOLN Place 1 drop into both eyes 2 (two) times daily as needed (for dry eyes).   Marland Kitchen aspirin EC 81 MG tablet Take 81 mg by mouth daily.  Marland Kitchen atorvastatin (LIPITOR) 40 MG tablet TAKE 1 TABLET (40 MG TOTAL) BY MOUTH DAILY.  Roseanne Kaufman [place]-Castor Oil (VENELEX) OINT Apply to sacrum and bilateral every shift prn for prevention  . budesonide-formoterol (SYMBICORT) 160-4.5 MCG/ACT inhaler INHALE 2 PUFF INTO THE LUNGS 2 TIMES DAILY.  . carbidopa-levodopa (SINEMET IR)  25-100 MG tablet Take three tablets by mouth three times daily  . feeding supplement, ENSURE ENLIVE, (ENSURE ENLIVE) LIQD Take 237 mLs by mouth 2 (two) times daily between meals.  . Demedex 20 MG tablet Take 20 mg by mouth daily.  Marland Kitchen guaiFENesin (MUCINEX) 600 MG 12 hr tablet Take 1 tablet (600 mg total) by mouth 2 (two) times daily.  . metoprolol tartrate (LOPRESSOR) 25 MG tablet TAKE 1 TABLET BY MOUTH TWICE A DAY  . Multiple Vitamins-Minerals (CENTRUM SILVER 50+MEN) TABS Take 1 tablet by mouth daily.  . nitroGLYCERIN (NITROSTAT) 0.4 MG SL tablet Place 1 tablet (0.4 mg total) under the tongue every 5 (five) minutes as needed for chest pain. May take up to 3 doses per episode.  . pantoprazole (PROTONIX) 40 MG tablet TAKE 1 TABLET BY MOUTH DAILY.  Marland Kitchen polyethylene glycol (MIRALAX / GLYCOLAX) packet Take 17 g by mouth 2 (two) times daily.  . potassium chloride (K-DUR,KLOR-CON) 10 MEQ tablet Take 10 mEq by mouth daily.  Marland Kitchen senna (SENOKOT) 8.6 MG TABS tablet Take 2 tablets (17.2 mg total) by mouth at bedtime.  . tamsulosin (FLOMAX) 0.4 MG CAPS capsule TAKE ONE CAPSULE BY MOUTH AT BEDTIME.  Marland Kitchen tiotropium (SPIRIVA) 18 MCG inhalation capsule Place 1 capsule (18 mcg total) into inhaler and inhale daily.  . [DISCONTINUED] predniSONE (DELTASONE) 50 MG tablet Take 1 tablet (50 mg total) by mouth daily with breakfast. X 3 days    Review of systems--this  is limited to secondary to patient not being good historian provided by son as well  General not complaining of any fever or chills.  Skin is not complaining of itching or rashes at this time   head ears eyes nose mouth and throat does have an Aspen collar does not complain of visual changes or difficulty swallowing.--Left eye conjunctivitis has improved per son   Respiratory has some history of cough and shortness of breath with COPD apparently this is stabilized.  Cardiac does have lower extremity edema this appears relatively stabilized I would say it is moderate does not complain of chest pain.  GI is not complaining of abdominal pain nausea vomiting diarrhea apparently has had some constipation but I did not hear complaints of that today.  GU does have a history of urinary retention again did fail a voiding trial-.  Musculoskeletal is not complaining of joint pain continues with weakness.  Neurologic has weakness does not complain of dizziness headache or numbness.  And psych does have a history it appears of some cognitive decline with history of Parkinson's.  Physical exam.  Temperature is 98.4 pulse 83 respiration 17 blood pressure 124/64- O2 saturation is 92%  In general this is a pleasant elderly male in no distress lying comfortably in bed.  His skin is warm and dry.  Eyes appear to be relatively clear bilaterally a small amount of erythema exudate of the left medial margin-visual acuity appears to be intact per son this has improved from yesterday.  Oropharynx is clear mucous membranes moist.  Neck he does have an Aspen collar in place.  Chest continues with some mild rhonchi I suspect there is some chronicity to this there is no labored breathing.  Heart is regular rate and rhythm without murmur gallop or rub he has moderate lower extremity edema   abdomen is soft nontender with positive bowel sounds.  Musculoskeletal Limited exam since he is in bed is able to  move upper extremities has continued lower extremity weakness and what appears  to be some stiffness.  He does have an Aspen collar in place on his neck.   Neurologic his speech is clear but he is does not speak much cannot really appreciate lateralizing findings cannot really appreciate any tremors.  Psych he is oriented to self is pleasant appropriate does not speak much.     Labs.  April 25, 2018.  WBC 11.6 hemoglobin 8.1 platelets 274.  Sodium 137 potassium 4.4 BUN 52 creatinine 1.63.  Assessment and plan.  1.  Left eye conjunctivitis this actually is improved however there is concern about allergy to Levaquin so he has been switched to gentamicin-this was discussed with the son- at this point monitor no signs of allergic reaction.  2.  Diastolic CHF- edema appears to have moderated we will have to monitor his weights closely will write an order to monitor weights Monday Wednesday Friday notify provider of gain greater than3 pounds--he continues on 20 mg of Demadex with 10 mEq of potassium update metabolic panel was pending for next week.  3.  History of acute urinary retention he is on Flomax he did fail voiding trial-- Foley catheter ihasbeen replaced nursing will notify urology he does have follow-up with them   #4 history of chronic kidney disease last creatinines have been in the 1.6 range this appears relatively stable but will have to be watched since he is on a diuretic update labs are pending early next week.  5.  Anemia thought due to chronic kidney disease- iron studies have been ordered.  6.  COPD at this point appears relatively baseline on Symbicort Spiriva no sign of distress on exam today.  [id number]

## 2018-04-27 ENCOUNTER — Encounter: Payer: Self-pay | Admitting: Internal Medicine

## 2018-05-02 ENCOUNTER — Encounter: Payer: Self-pay | Admitting: Internal Medicine

## 2018-05-02 ENCOUNTER — Encounter (HOSPITAL_COMMUNITY)
Admission: RE | Admit: 2018-05-02 | Discharge: 2018-05-02 | Disposition: A | Discharge status: Home / Self Care | Payer: Medicare Other | Source: Skilled Nursing Facility | Attending: *Deleted | Admitting: *Deleted

## 2018-05-02 ENCOUNTER — Non-Acute Institutional Stay (SKILLED_NURSING_FACILITY): Payer: Medicare Other | Admitting: Internal Medicine

## 2018-05-02 DIAGNOSIS — I5032 Chronic diastolic (congestive) heart failure: Secondary | ICD-10-CM

## 2018-05-02 DIAGNOSIS — N183 Chronic kidney disease, stage 3 unspecified: Secondary | ICD-10-CM

## 2018-05-02 DIAGNOSIS — D649 Anemia, unspecified: Secondary | ICD-10-CM

## 2018-05-02 DIAGNOSIS — R9389 Abnormal findings on diagnostic imaging of other specified body structures: Secondary | ICD-10-CM | POA: Diagnosis not present

## 2018-05-02 DIAGNOSIS — J690 Pneumonitis due to inhalation of food and vomit: Secondary | ICD-10-CM

## 2018-05-02 LAB — CBC WITH DIFFERENTIAL/PLATELET
Abs Immature Granulocytes: 0.06 10*3/uL (ref 0.00–0.07)
Basophils Absolute: 0 10*3/uL (ref 0.0–0.1)
Basophils Relative: 0 %
EOS ABS: 0 10*3/uL (ref 0.0–0.5)
Eosinophils Relative: 0 %
HCT: 26.7 % — ABNORMAL LOW (ref 39.0–52.0)
Hemoglobin: 8.2 g/dL — ABNORMAL LOW (ref 13.0–17.0)
Immature Granulocytes: 0 %
Lymphocytes Relative: 3 %
Lymphs Abs: 0.4 10*3/uL — ABNORMAL LOW (ref 0.7–4.0)
MCH: 30.1 pg (ref 26.0–34.0)
MCHC: 30.7 g/dL (ref 30.0–36.0)
MCV: 98.2 fL (ref 80.0–100.0)
MONO ABS: 0.9 10*3/uL (ref 0.1–1.0)
Monocytes Relative: 6 %
Neutro Abs: 12.9 10*3/uL — ABNORMAL HIGH (ref 1.7–7.7)
Neutrophils Relative %: 91 %
Platelets: 329 10*3/uL (ref 150–400)
RBC: 2.72 MIL/uL — ABNORMAL LOW (ref 4.22–5.81)
RDW: 16.8 % — AB (ref 11.5–15.5)
WBC: 14.3 10*3/uL — ABNORMAL HIGH (ref 4.0–10.5)
nRBC: 0 % (ref 0.0–0.2)

## 2018-05-02 LAB — BASIC METABOLIC PANEL
Anion gap: 11 (ref 5–15)
BUN: 57 mg/dL — ABNORMAL HIGH (ref 8–23)
CALCIUM: 8.8 mg/dL — AB (ref 8.9–10.3)
CO2: 26 mmol/L (ref 22–32)
Chloride: 104 mmol/L (ref 98–111)
Creatinine, Ser: 2.15 mg/dL — ABNORMAL HIGH (ref 0.61–1.24)
GFR calc Af Amer: 29 mL/min — ABNORMAL LOW (ref >60)
GFR calc non Af Amer: 25 mL/min — ABNORMAL LOW (ref >60)
Glucose, Bld: 121 mg/dL — ABNORMAL HIGH (ref 70–99)
Potassium: 4.3 mmol/L (ref 3.5–5.1)
Sodium: 141 mmol/L (ref 135–145)

## 2018-05-02 LAB — FOLATE: Folate: 21.5 ng/mL (ref >5.9)

## 2018-05-02 LAB — IRON AND TIBC
Iron: 13 ug/dL — ABNORMAL LOW (ref 45–182)
Saturation Ratios: 6 % — ABNORMAL LOW (ref 17.9–39.5)
TIBC: 227 ug/dL — ABNORMAL LOW (ref 250–450)
UIBC: 214 ug/dL

## 2018-05-02 LAB — FERRITIN: Ferritin: 143 ng/mL (ref 24–336)

## 2018-05-02 NOTE — Progress Notes (Signed)
Location:    [place] Room Number: 125/P Place of Service:  SNF [phone]) Provider:  Ewell Poe, MD  Patient Care Team: Kathyrn Drown, MD as PCP - General [facility] Medicine)  Extended Emergency Contact Information Primary Emergency Contact: [facility] Address: [address]          [place], [place] [address] [place] of [place] Phone: [phone] Mobile Phone: [phone] Relation: Son Secondary Emergency Contact: [facility] Address: [address]          [place], [place] [address] [place] Phone: [phone] Relation: Spouse  Code Status:  DNR Goals of care: Advanced Directive information Advanced Directives 05/02/2018  Does Patient Have a Medical Advance Directive? Yes  Type of Advance Directive Out of facility DNR (pink MOST or yellow form)  Does patient want to make changes to medical advance directive? No - Patient declined  Copy of [place] in Chart? -  Would patient like information on creating a medical advance directive? No - Patient declined  Pre-existing out of facility DNR order (yellow form or pink MOST form) -     Chief Complaint  Patient presents with  . Acute Visit    Pneumonia  Also follow-up renal insufficiency with chronic kidney disease-and anemia HPI:  Pt is a 85 y.o. male seen today for an acute visit for follow-up of suspected pneumonia.  Patient is a pleasant 85 year old male here for rehab after hospitalization for recurrent falls and fusion.  He has a history of coronary artery disease status post CABG as well as COPD Parkinson's disease chronic diastolic CHF as well as hypertension and hyperlipidemia in addition to squamous cell lung cancer status post resection and cognitive impairment.  He also has a history of a recent odontoid fracture--he does have a collar placed and will have neurology follow-up.  Patient went back to the ER on most recent  admission with recurrent falls- questionable syncope- CT and echo did not show any acute process he had acute urinary retention and severe constipation.  He was treated for both and had a Foley catheter placed.  Continues with the catheter secondary to failing voiding trials he will have follow-up by urology.  After admission to facility was noted to have some weight gain and increased edema and was started on Lasix initially and this was switched to [facility] currently 20 mg a day edema appears to have improved. He has lost about 10 pounds.  He also has suspected conjunctivitis and is completing a course of gentamicin this appears to have improved this was more so his left eye.  Apparently yesterday he developed some mild hypoxia and congestion x-ray was ordered which showed bilateral infiltrates at the bases.  He was also started on low-dose oxygen  He was started empirically on azithromycin last night also nebulizers were made routine he continues on routine Mucinex as well.  Apparently today he appears to be stable vital signs appear to be stable he actually participated in therapy and apparently was close to his baseline.  Currently he is visiting with family in room and appears to be essentially at his baseline  I do note on labs done today he has shown some increased renal insufficiency with a creatinine of 2.15 previous creatinines have been in the higher ones- 1.6-1.7 area with BUNs in the 50s it is 57 today  Iron studies also were done today secondary to chronic anemia hemoglobin at 8.2 today show  stability folate is within normal range however iron is low at 13 total iron-binding capacity low at 227 ferritin is within normal range in the 140s    Past Medical History:  Diagnosis Date  . ASCVD (arteriosclerotic cardiovascular disease)     CABG in 04/1993; and negative stress nuclear study in 08/2001  . Benign essential tremor   . CAD (coronary artery disease)   . Cancer ([place])     skin  . Cerebrovascular disease    Right carotid bruit-40-69% left internal carotid artery stenosis in 4/06; followed [facility]  . Chronic kidney disease, stage II (mild)    Creatinine-1.6 in 09/2008  . COPD (chronic obstructive pulmonary disease) ([place])   . Degenerative joint disease of knee, left   . Gout   . Hiatal hernia   . Horseshoe kidney   . Hyperlipidemia   . Hypertension   . Lung cancer ([place]) 2013  . Normocytic anemia   . Prediabetes   . Pulmonary disease   . Renal insufficiency   . Syncope   . Tobacco abuse, in remission    40 pack years; discontinued in 1980   Past Surgical History:  Procedure Laterality Date  . CARDIAC SURGERY    . COLONOSCOPY W/ POLYPECTOMY  1985  . CORONARY ARTERY BYPASS GRAFT  1995  . LESION EXCISION    . PILONIDAL CYST EXCISION  1948    Allergies  Allergen Reactions  . Propranolol Nausea Only  . Levaquin [Levofloxacin In D5w] Hives and Nausea And Vomiting  . Penicillins Hives and Swelling    Patient has tolerated cephalosporins several times  . Sulfonamide Derivatives Hives and Swelling  . Sympathomimetics Other (See Comments)  . Tape Other (See Comments)    SKIN IS VERY THIN AND TEARS EASILY; PLEASE USE AN ALTERNATIVE TO TAPE!!    Outpatient Encounter Medications as of 05/02/2018  Medication Sig  . albuterol (PROVENTIL) (2.5 MG/3ML) 0.083% nebulizer solution Inhale 3 mLs into the lungs every 4 (four) hours as needed for wheezing or shortness of breath.  Marland Kitchen albuterol (PROVENTIL) (2.5 MG/3ML) 0.083% nebulizer solution Take 2.5 mg by nebulization every 6 (six) hours. For 48 hours from 05/01/2018-05/03/2018  . albuterol (VENTOLIN HFA) 108 (90 Base) MCG/ACT inhaler INHALE 2 PUFFS INTO THE LUNGS EVERY 6 HOURS AS NEEDED FOR SHORTNESS OF BREATH  . Artificial Tear Solution (SOOTHE XP) SOLN Place 1 drop into both eyes 2 (two) times daily as needed (for dry eyes).   Marland Kitchen aspirin EC 81 MG tablet Take 81 mg by mouth daily.  Marland Kitchen atorvastatin (LIPITOR) 40 MG tablet  TAKE 1 TABLET (40 MG TOTAL) BY MOUTH DAILY.  Marland Kitchen azithromycin (ZITHROMAX) 250 MG tablet Take 250 mg by mouth daily. Take for 4 days from 05/02/2018-05/05/2018  . Balsam [place]-Castor Oil (VENELEX) OINT Apply to sacrum and bilateral every shift prn for prevention  . budesonide-formoterol (SYMBICORT) 160-4.5 MCG/ACT inhaler INHALE 2 PUFF INTO THE LUNGS 2 TIMES DAILY.  . carbidopa-levodopa (SINEMET IR) 25-100 MG tablet Take three tablets by mouth three times daily  . feeding supplement, ENSURE ENLIVE, (ENSURE ENLIVE) LIQD Take 237 mLs by mouth 2 (two) times daily between meals.  Marland Kitchen gentamicin (GARAMYCIN) 0.3 % ophthalmic solution Place 2 drops into the left eye 3 (three) times daily.  Marland Kitchen guaiFENesin (MUCINEX) 600 MG 12 hr tablet Take 1 tablet (600 mg total) by mouth 2 (two) times daily.  . metoprolol tartrate (LOPRESSOR) 25 MG tablet TAKE 1 TABLET BY MOUTH TWICE A DAY  . Multiple Vitamins-Minerals (CENTRUM  SILVER 50+MEN) TABS Take 1 tablet by mouth daily.  . nitroGLYCERIN (NITROSTAT) 0.4 MG SL tablet Place 1 tablet (0.4 mg total) under the tongue every 5 (five) minutes as needed for chest pain. May take up to 3 doses per episode.  . pantoprazole (PROTONIX) 40 MG tablet TAKE 1 TABLET BY MOUTH DAILY.  Marland Kitchen polyethylene glycol (MIRALAX / GLYCOLAX) packet Take 17 g by mouth 2 (two) times daily.  . potassium chloride (K-DUR,KLOR-CON) 10 MEQ tablet Take 10 mEq by mouth daily.  Marland Kitchen senna (SENOKOT) 8.6 MG TABS tablet Take 2 tablets (17.2 mg total) by mouth at bedtime.  . tamsulosin (FLOMAX) 0.4 MG CAPS capsule TAKE ONE CAPSULE BY MOUTH AT BEDTIME.  Marland Kitchen tiotropium (SPIRIVA) 18 MCG inhalation capsule Place 1 capsule (18 mcg total) into inhaler and inhale daily.  Marland Kitchen torsemide (DEMADEX) 20 MG tablet Take 20 mg by mouth daily.  . [DISCONTINUED] furosemide (LASIX) 20 MG tablet Take 20 mg by mouth daily.   No facility-administered encounter medications on file as of 05/02/2018.     Review of Systems.  This is limited  secondary to patient being a poor historian provided by family as well as nursing as well.  In general is not complaining of fever chills.  Skin does not complain of itching or rashes.  Head ears eyes nose mouth and throat continues with an Aspen collar is not complain of visual changes or difficulty swallowing   respiratory is not really complaining of increased shortness of breath beyond baseline or increased cough-again x-ray was her bilateral infiltrates.  Cardiac is not complaining of chest pain edema appears to be mildly improved.  GU is not complaining of dysuria he does have an indwelling Foley catheter has failed voiding trials.  Musculoskeletal continues with weakness but this does not appear to be increased beyond baseline he is not complaining of joint pain currently.  Neurologic is positive for weakness also a history of- Parkinson's- he does not complain of headache dizziness or syncope.  Psych does not appear overtly depressed or anxious does have some cognitive deficits Immunization History  Administered Date(s) Administered  . Influenza Split 01/10/2013  . Influenza,inj,Quad PF,6+ Mos 01/10/2014, 01/07/2015, 01/09/2016, 02/09/2017, 12/14/2017  . Influenza-Unspecified 01/12/2012  . Pneumococcal Conjugate-13 10/27/2013  . Pneumococcal Polysaccharide-23 01/11/2001, 07/13/2007  . Td 11/26/2004, 05/23/2015  . Tdap 11/22/2014  . Zoster 02/28/2008  . Zoster Recombinat (Shingrix) 03/18/2018   Pertinent  Health Maintenance Due  Topic Date Due  . INFLUENZA VACCINE  Completed  . PNA vac Low Risk Adult  Completed   Fall Risk  09/06/2015 01/07/2015 04/20/2014 09/19/2013  Falls in the past year? No No Yes No  Number falls in past yr: - - 2 or more -  Risk Factor Category  - - High Fall Risk -  Risk for fall due to : - - History of fall(s);Impaired mobility -   Functional Status Survey:    He is afebrile pulse is 80 respirations of 18 blood pressure 124/58 O2 saturation is  92%  Physical Exam   In general this continues to be a pleasant elderly male in no distress he appears to be sitting comfortably in his wheelchair at baseline he is eating lunch.  His skin is warm and dry.  Eyes sclera and conjunctive bilaterally appear to be clear visual acuity intact.  Oropharynx he does have some food residue on tongue mucous membranes appear moist.  Chest he does have some diffuse coarse breath sounds mild rhonchi there is no  labored breathing.  Abdomen is soft nontender with positive bowel sounds.  GU does have a Foley catheter draining slightly blood-tinged urine apparently he had been pulling on his catheter per son this does not appear to be any gross hematuria.  Musculoskeletal appears to be able to move his extremities at baseline with baseline lower extremity weakness and stiffness.  He does have an Designer, multimedia in place.  Neurologic speech is clear speaks a little bit more than he has in the past cannot really appreciate lateralizing findings.  Psych he is oriented to self he is pleasant appropriate follow simple verbal commands without difficulty is talking a bit more this afternoon  Labs reviewed: Recent Labs    05/10/17 1045  10/29/17 1112  04/21/18 0700 04/25/18 0839 05/02/18 0800  NA  --    < > 139   < > 138 137 141  K  --    < > 4.1   < > 4.3 4.4 4.3  CL  --    < > 103   < > 111 108 104  CO2  --    < > 25   < > 21* 24 26  GLUCOSE  --    < > 124*   < > 98 81 121*  BUN  --    < > 57*   < > 50* 52* 57*  CREATININE  --    < > 2.20*   < > 1.68* 1.63* 2.15*  CALCIUM 8.6   < > 9.1   < > 9.4 8.5* 8.8*  MG 2.5*  --  2.2  --   --   --   --    < > = values in this interval not displayed.   Recent Labs    03/16/18 0836 03/27/18 0828 04/14/18 0832  AST 16 22 20   ALT 4 5 5   ALKPHOS 83 72 81  BILITOT 0.4 0.9 0.5  PROT 5.6* 6.3* 6.2*  ALBUMIN 3.6 3.3* 3.3*   Recent Labs    04/21/18 0700 04/25/18 0839 05/02/18 0800  WBC 10.8* 11.6* 14.3*   NEUTROABS 9.1* 9.5* 12.9*  HGB 8.5* 8.1* 8.2*  HCT 27.7* 25.9* 26.7*  MCV 99.3 96.6 98.2  PLT 294 274 329   Lab Results  Component Value Date   TSH 7.565 (H) 04/14/2018   Lab Results  Component Value Date   HGBA1C 5.3 01/07/2015   Lab Results  Component Value Date   CHOL 152 03/16/2018   HDL 81 03/16/2018   LDLCALC 62 03/16/2018   TRIG 45 03/16/2018   CHOLHDL 1.9 03/16/2018    Significant Diagnostic Results in last 30 days:  Dg Chest 2 View  Result Date: 04/14/2018 CLINICAL DATA:  Weakness and syncope. Fell today. History of lung cancer. EXAM: CHEST - 2 VIEW COMPARISON:  Multiple prior chest x-rays from 2019 and 2018. FINDINGS: The cardiac silhouette, mediastinal and hilar contours are within normal limits and stable. Moderate tortuosity and calcification of the thoracic aorta is again demonstrated. Prior bypass surgery. Chronic left apical scarring changes and probable radiation changes. Underlying emphysema. No acute overlying pulmonary findings. Chronic right basilar scarring changes and pleural thickening. The bony thorax is intact. IMPRESSION: Chronic emphysematous changes, pulmonary scarring and left upper lobe scarring and radiation. No acute overlying pulmonary process. Electronically Signed   By: Marijo Sanes M.D.   On: 04/14/2018 08:10   Ct Head Wo Contrast  Result Date: 04/14/2018 CLINICAL DATA:  Tremors and altered mental status EXAM: CT  HEAD WITHOUT CONTRAST TECHNIQUE: Contiguous axial images were obtained from the base of the skull through the vertex without intravenous contrast. COMPARISON:  March 27, 2018 FINDINGS: Brain: Mild diffuse atrophy is stable. There is no intracranial mass, hemorrhage, extra-axial fluid collection, or midline shift. There is patchy small vessel disease in the centra semiovale bilaterally. There is an apparent old infarct in the peripheral left temporal-occipital region, stable. No acute infarct is appreciable. Vascular: There is no evident  vascular calcification. There is calcification in each distal vertebral artery and carotid siphon region. Skull: Bony calvarium appears intact. Sinuses/Orbits: There is mucosal thickening in multiple ethmoid air cells bilaterally. Other visualized paranasal sinuses are clear. Orbits appear symmetric bilaterally. Other: Mastoid air cells are clear. IMPRESSION: Atrophy with supratentorial small vessel disease. Prior left temporal-occipital infarct. No acute infarct. No mass or hemorrhage. There are foci of arterial vascular calcification. There is mucosal thickening in several ethmoid air cells. Electronically Signed   By: Lowella Grip III M.D.   On: 04/14/2018 10:30   Ct Abdomen Pelvis W Contrast  Result Date: 04/14/2018 CLINICAL DATA:  Abdominal pain, acute, generalized. EXAM: CT ABDOMEN AND PELVIS WITH CONTRAST TECHNIQUE: Multidetector CT imaging of the abdomen and pelvis was performed using the standard protocol following bolus administration of intravenous contrast. CONTRAST:  54mL OMNIPAQUE IOHEXOL 300 MG/ML  SOLN COMPARISON:  10/29/2017 FINDINGS: Lower chest: Moderate sliding hiatal hernia. Extensive coronary calcification. Scarring in the right lower lobe with volume loss and mild bronchiectasis. Mild centrilobular emphysema. Hepatobiliary: No focal liver abnormality.No evidence of biliary obstruction or stone. Pancreas: Unremarkable. Spleen: Unremarkable. Adrenals/Urinary Tract: Negative adrenals. Horseshoe kidney. Bilateral hydroureteronephrosis likely from the very distended bladder which is likely patulous at baseline due to the partially folded appearance. Stomach/Bowel: Formed stool throughout much of the colon, most notably the rectum which is distended to 10 cm and fills the pelvis. No evident bowel inflammation. The appendix is difficult to discretely visualize. Vascular/Lymphatic: Extensive atherosclerotic calcification with high-grade bilateral common femoral and superficial femoral  stenosis. No mass or adenopathy. Reproductive:Negative Other: No ascites or pneumoperitoneum. Musculoskeletal: Advanced spinal degeneration. No acute osseous finding. IMPRESSION: 1. Constipation with rectal impaction and distention to 10 cm. 2. Dilated bladder, possibly related to#1, with bilateral hydronephrosis of the horseshoe kidneys. 3. Chronic findings are described above. Electronically Signed   By: Monte Fantasia M.D.   On: 04/14/2018 10:30   US Renal  Result Date: 04/15/2018 CLINICAL DATA:  Hydronephrosis. EXAM: RENAL / URINARY TRACT ULTRASOUND COMPLETE COMPARISON:  CT abdomen pelvis from yesterday. FINDINGS: Right Kidney: Renal measurements: 10.4 x 4.4 x 4.6 cm = volume: 108 mL . Echogenicity within normal limits. Mild right hydronephrosis is unchanged. No mass visualized. Left Kidney: Renal measurements: 10.6 x 4.2 x 3.7 cm = volume: 86 mL. Echogenicity within normal limits. No mass or hydronephrosis visualized. Bladder: Decompressed by Foley catheter. IMPRESSION: 1. Unchanged mild right hydronephrosis. Resolved left hydronephrosis. Electronically Signed   By: Titus Dubin M.D.   On: 04/15/2018 12:22    Assessment/Plan  #1- history of bilateral infiltrates suspicion for aspiration-he will be seen by speech therapy.  Will switch antibiotic from azithromycin to doxycycline 100 mg twice daily for 7 days- apparently he has been on this previously and done well he does have numerous antibiotic allergies.  Will start probiotic twice daily for 10 days-monitor vital signs pulse ox every shift for now.  He also is on routine nebulizers through tomorrow and then will have it every 4 hours as needed.  He also is on routine Mucinex as well as Spiriva and Symbicort.    2 CHF weight and edema appears to have gradually improved- most recent weight of 130 is down about 10 pounds since he has high Mark.  He is on 20 mg of Demadex with 10 M EQ's of potassium.   #3 This is complicated with his  renal issues creatinine has risen slightly to slightly over 2.  BUN is fairly stable in the 50s.  Since he is doing well on the current dose of Demadex will recheck this next week and monitor family does understand this is somewhat of a balancing situation with his cardiac issues   #4 anemia apparently with a history of chronic disease with his renal issues- lab work today did show a significantly low iron as well as depressed total iron-binding capacity-  We will start iron 325 mg twice daily-.  4.  History of urinary retention he will have urology follow-up continues with indwelling Foley catheter for now-he has failed voiding trials he is on Flomax.  [id number] note  greater than 40 minutes spent assessing patient-discussing his status with family as well as plan of action with Dr. Laureen Abrahams his chart and labs- and coordinating and formulating a plan of care for numerous diagnoses- of note greater than 50% of time spent coordinating a plan of care with input as noted above

## 2018-05-03 ENCOUNTER — Other Ambulatory Visit (HOSPITAL_COMMUNITY): Payer: Self-pay | Admitting: Specialist

## 2018-05-03 DIAGNOSIS — R1319 Other dysphagia: Secondary | ICD-10-CM

## 2018-05-04 ENCOUNTER — Non-Acute Institutional Stay (SKILLED_NURSING_FACILITY): Payer: Medicare Other | Admitting: Internal Medicine

## 2018-05-04 ENCOUNTER — Encounter: Payer: Self-pay | Admitting: Internal Medicine

## 2018-05-04 DIAGNOSIS — R600 Localized edema: Secondary | ICD-10-CM

## 2018-05-04 DIAGNOSIS — I5032 Chronic diastolic (congestive) heart failure: Secondary | ICD-10-CM

## 2018-05-04 DIAGNOSIS — J181 Lobar pneumonia, unspecified organism: Secondary | ICD-10-CM | POA: Diagnosis not present

## 2018-05-04 DIAGNOSIS — N183 Chronic kidney disease, stage 3 unspecified: Secondary | ICD-10-CM

## 2018-05-04 NOTE — Progress Notes (Signed)
Location:    [facility] Room Number: 125/P Place of Service:  SNF [phone]) Provider:  Ewell Poe, MD  Patient Care Team: Kathyrn Drown, MD as PCP - General [facility] Medicine)  Extended Emergency Contact Information Primary Emergency Contact: [facility] Address: [address]          [place], [place] [address] [place] of [place] Phone: [phone] Mobile Phone: [phone] Relation: Son Secondary Emergency Contact: [facility] Address: [address]          [place], [place] [address] [place] Phone: [phone] Relation: Spouse  Code Status:  DNR Goals of care: Advanced Directive information Advanced Directives 05/04/2018  Does Patient Have a Medical Advance Directive? Yes  Type of Advance Directive Out of facility DNR (pink MOST or yellow form)  Does patient want to make changes to medical advance directive? No - Patient declined  Copy of [place] in Chart? -  Would patient like information on creating a medical advance directive? No - Patient declined  Pre-existing out of facility DNR order (yellow form or pink MOST form) -     Chief Complaint  Patient presents with  . Acute Visit    Edema of legs    HPI:  Pt is a 85 y.o. male seen today for an acute visit for  Secondary to family concerns the patient has had some increased lower extremity edema.  Patient is here for rehab after hospitalization for recurrent falls with confusion.  He also has a history of coronary artery disease status post CABG as well as Parkinson's disease-diastolic CHF COPD hypertension and hyperlipidemia also has a history of squamous cell carcinoma status post resection and cognitive impairment.  He also has a neck brace secondary to recent odontoid facture.  Here is here for rehab after having recurrent falls with questionable syncope CT scan of the head and echo of the heart did not show any acute  process.  He also had acute urinary retention and continues with an indwelling Foley catheter he is failed voiding trials but will have follow-up by urology.  After initial admission to the facility was noted to have some weight gain and increased edema and was started on Lasix which is been switched to [facility] currently 20 mg a day edema appears to have improved and he has lost weight and this continues he is lost it appears about 15 pounds.  His family has noted that he appears to have some increased edema more so of his feet.  Patient himself is not complaining of any increased shortness of breath he is receiving doxycycline for suspected aspiration pneumonia  Currently patient is sitting in his wheelchair comfortably he is somewhat of a poor historian but is not complain of any increased shortness of breath or discomfort nursing is not reported any issues oxygen saturations continue to be satisfactory in the 90s    Past Medical History:  Diagnosis Date  . ASCVD (arteriosclerotic cardiovascular disease)     CABG in 04/1993; and negative stress nuclear study in 08/2001  . Benign essential tremor   . CAD (coronary artery disease)   . Cancer ([place])    skin  . Cerebrovascular disease    Right carotid bruit-40-69% left internal carotid artery stenosis in 4/06; followed [facility]  . Chronic kidney disease, stage II (mild)    Creatinine-1.6 in 09/2008  . COPD (chronic obstructive pulmonary disease) ([place])   . Degenerative joint disease of  knee, left   . Gout   . Hiatal hernia   . Horseshoe kidney   . Hyperlipidemia   . Hypertension   . Lung cancer ([place]) 2013  . Normocytic anemia   . Prediabetes   . Pulmonary disease   . Renal insufficiency   . Syncope   . Tobacco abuse, in remission    40 pack years; discontinued in 1980   Past Surgical History:  Procedure Laterality Date  . CARDIAC SURGERY    . COLONOSCOPY W/ POLYPECTOMY  1985  . CORONARY ARTERY BYPASS GRAFT  1995  . LESION EXCISION     . PILONIDAL CYST EXCISION  1948    Allergies  Allergen Reactions  . Propranolol Nausea Only  . Levaquin [Levofloxacin In D5w] Hives and Nausea And Vomiting  . Penicillins Hives and Swelling    Patient has tolerated cephalosporins several times  . Sulfonamide Derivatives Hives and Swelling  . Sympathomimetics Other (See Comments)  . Tape Other (See Comments)    SKIN IS VERY THIN AND TEARS EASILY; PLEASE USE AN ALTERNATIVE TO TAPE!!    Outpatient Encounter Medications as of 05/04/2018  Medication Sig  . albuterol (PROVENTIL) (2.5 MG/3ML) 0.083% nebulizer solution Inhale 3 mLs into the lungs every 4 (four) hours as needed for wheezing or shortness of breath.  Marland Kitchen albuterol (PROVENTIL) (2.5 MG/3ML) 0.083% nebulizer solution Take 2.5 mg by nebulization every 6 (six) hours. For 48 hours from 05/01/2018-05/03/2018  . Artificial Tear Solution (SOOTHE XP) SOLN Place 2 drops into both eyes 2 (two) times daily as needed (for dry eyes).   Marland Kitchen aspirin EC 81 MG tablet Take 81 mg by mouth daily.  Marland Kitchen atorvastatin (LIPITOR) 40 MG tablet TAKE 1 TABLET (40 MG TOTAL) BY MOUTH DAILY.  Roseanne Kaufman Peru-Castor Oil (VENELEX) OINT Apply to sacrum and bilateral every shift prn for prevention  . budesonide-formoterol (SYMBICORT) 160-4.5 MCG/ACT inhaler INHALE 2 PUFF INTO THE LUNGS 2 TIMES DAILY.  . carbidopa-levodopa (SINEMET IR) 25-100 MG tablet Take three tablets by mouth three times daily  . doxycycline (VIBRAMYCIN) 100 MG capsule Take 100 mg by mouth 2 (two) times daily. For 7 days from 1/20/220-/27/2020  . feeding supplement, ENSURE ENLIVE, (ENSURE ENLIVE) LIQD Take 237 mLs by mouth 2 (two) times daily between meals.  . Ferrous Sulfate (IRON) 325 (65 Fe) MG TABS Take 1 tablet by mouth twice a day  . guaiFENesin (MUCINEX) 600 MG 12 hr tablet Take 1 tablet (600 mg total) by mouth 2 (two) times daily.  . metoprolol tartrate (LOPRESSOR) 25 MG tablet TAKE 1 TABLET BY MOUTH TWICE A DAY  . Multiple Vitamins-Minerals  (CENTRUM SILVER 50+MEN) TABS Take 1 tablet by mouth daily.  . nitroGLYCERIN (NITROSTAT) 0.4 MG SL tablet Place 1 tablet (0.4 mg total) under the tongue every 5 (five) minutes as needed for chest pain. May take up to 3 doses per episode.  . pantoprazole (PROTONIX) 40 MG tablet TAKE 1 TABLET BY MOUTH DAILY.  Marland Kitchen polyethylene glycol (MIRALAX / GLYCOLAX) packet Take 17 g by mouth daily.  . potassium chloride (K-DUR,KLOR-CON) 10 MEQ tablet Take 10 mEq by mouth daily.  . Probiotic Product (RISA-BID PROBIOTIC) TABS Take 1 tablet by mouth twice a day  . senna (SENOKOT) 8.6 MG TABS tablet Take 2 tablets (17.2 mg total) by mouth at bedtime.  . tamsulosin (FLOMAX) 0.4 MG CAPS capsule TAKE ONE CAPSULE BY MOUTH AT BEDTIME.  Marland Kitchen tiotropium (SPIRIVA) 18 MCG inhalation capsule Place 1 capsule (18 mcg total) into inhaler  and inhale daily.  Marland Kitchen torsemide (DEMADEX) 20 MG tablet Take 20 mg by mouth daily.  . [DISCONTINUED] albuterol (VENTOLIN HFA) 108 (90 Base) MCG/ACT inhaler INHALE 2 PUFFS INTO THE LUNGS EVERY 6 HOURS AS NEEDED FOR SHORTNESS OF BREATH  . [DISCONTINUED] azithromycin (ZITHROMAX) 250 MG tablet Take 250 mg by mouth daily. Take for 4 days from 05/02/2018-05/05/2018  . [DISCONTINUED] gentamicin (GARAMYCIN) 0.3 % ophthalmic solution Place 2 drops into the left eye 3 (three) times daily.  . [DISCONTINUED] polyethylene glycol (MIRALAX / GLYCOLAX) packet Take 17 g by mouth 2 (two) times daily. (Patient taking differently: Take 17 g by mouth daily. )   No facility-administered encounter medications on file as of 05/04/2018.     Review of Systems   This is limited secondary to patient being somewhat of a poor historian provided by nursing as well.  General is not complaining of fever chills appears to continue to lose some weight.  Skin is not complaining of rashes or itching.  Head ears eyes nose mouth and throat is continued a topical antibiotic for conjunctivitis which appears to have resolved.  He does  have a neck collar in place he does not complain of visual changes or sore throat.  Respiratory is not complaining of increased shortness of breath beyond his baseline or increased cough he has been being treated for suspected bilateral aspiration pneumonia.  Cardiac does not complain of chest pain he does have some chronic lower extremity edema most noticeable on his feet today he is sitting up I suspect there is an element of dependency here.  GI is not complaining of abdominal pain nausea vomiting diarrhea constipation.  GU does not complain of dysuria he does have a chronic indwelling Foley catheter for now awaiting urology input.  Musculoskeletal has weakness is not complaining of joint pain.  Neurologic is positive for weakness he does not complain of being dizzy or having a headache he does have a history of Parkinson's.  Psych does have some cognitive deficits he does not complain of being depressed or anxious  Immunization History  Administered Date(s) Administered  . Influenza Split 01/10/2013  . Influenza,inj,Quad PF,6+ Mos 01/10/2014, 01/07/2015, 01/09/2016, 02/09/2017, 12/14/2017  . Influenza-Unspecified 01/12/2012  . Pneumococcal Conjugate-13 10/27/2013  . Pneumococcal Polysaccharide-23 01/11/2001, 07/13/2007  . Td 11/26/2004, 05/23/2015  . Tdap 11/22/2014  . Zoster 02/28/2008  . Zoster Recombinat (Shingrix) 03/18/2018   Pertinent  Health Maintenance Due  Topic Date Due  . INFLUENZA VACCINE  Completed  . PNA vac Low Risk Adult  Completed   Fall Risk  09/06/2015 01/07/2015 04/20/2014 09/19/2013  Falls in the past year? No No Yes No  Number falls in past yr: - - 2 or more -  Risk Factor Category  - - High Fall Risk -  Risk for fall due to : - - History of fall(s);Impaired mobility -   Functional Status Survey:    Vitals:   05/04/18 1644  BP: (!) 150/76  Pulse: 89  Resp: 16  Temp: 98.3 F (36.8 C)  TempSrc: Oral  SpO2: 91%  Manual blood pressure was  120/58  Physical Exam   In general this is a pleasant elderly male in no distress sitting comfortably in his wheelchair.  His skin is warm and dry.  Eyes sclera and conjunctive are clear visual acuity appears grossly intact.  Oropharynx- has some food residue on his tongue mucous membranes appear fairly moist from what I could ascertain.  Chest there is no labored breathing  he has some bronchial sounds on the left side- could not really appreciate any wheezing or other chest congestion.  Abdomen is soft nontender with positive bowel sounds.  GU continues with an indwelling Foley catheter draining amber-colored urine.  Musculoskeletal is able to move all extremities x4 with lower extremity weakness at baseline.  Continues with an Aspen collar on his neck.  Neurologic his speech is clear he does speak some but not much.  Could not really appreciate lateralizing findings cranial nerves appear grossly intact.  Psych he is oriented to self pleasant appropriate follows simple verbal commands.    Labs reviewed: Recent Labs    05/10/17 1045  10/29/17 1112  04/21/18 0700 04/25/18 0839 05/02/18 0800  NA  --    < > 139   < > 138 137 141  K  --    < > 4.1   < > 4.3 4.4 4.3  CL  --    < > 103   < > 111 108 104  CO2  --    < > 25   < > 21* 24 26  GLUCOSE  --    < > 124*   < > 98 81 121*  BUN  --    < > 57*   < > 50* 52* 57*  CREATININE  --    < > 2.20*   < > 1.68* 1.63* 2.15*  CALCIUM 8.6   < > 9.1   < > 9.4 8.5* 8.8*  MG 2.5*  --  2.2  --   --   --   --    < > = values in this interval not displayed.   Recent Labs    03/16/18 0836 03/27/18 0828 04/14/18 0832  AST 16 22 20   ALT 4 5 5   ALKPHOS 83 72 81  BILITOT 0.4 0.9 0.5  PROT 5.6* 6.3* 6.2*  ALBUMIN 3.6 3.3* 3.3*   Recent Labs    04/21/18 0700 04/25/18 0839 05/02/18 0800  WBC 10.8* 11.6* 14.3*  NEUTROABS 9.1* 9.5* 12.9*  HGB 8.5* 8.1* 8.2*  HCT 27.7* 25.9* 26.7*  MCV 99.3 96.6 98.2  PLT 294 274 329   Lab  Results  Component Value Date   TSH 7.565 (H) 04/14/2018   Lab Results  Component Value Date   HGBA1C 5.3 01/07/2015   Lab Results  Component Value Date   CHOL 152 03/16/2018   HDL 81 03/16/2018   LDLCALC 62 03/16/2018   TRIG 45 03/16/2018   CHOLHDL 1.9 03/16/2018    Significant Diagnostic Results in last 30 days:  Dg Chest 2 View  Result Date: 04/14/2018 CLINICAL DATA:  Weakness and syncope. Fell today. History of lung cancer. EXAM: CHEST - 2 VIEW COMPARISON:  Multiple prior chest x-rays from 2019 and 2018. FINDINGS: The cardiac silhouette, mediastinal and hilar contours are within normal limits and stable. Moderate tortuosity and calcification of the thoracic aorta is again demonstrated. Prior bypass surgery. Chronic left apical scarring changes and probable radiation changes. Underlying emphysema. No acute overlying pulmonary findings. Chronic right basilar scarring changes and pleural thickening. The bony thorax is intact. IMPRESSION: Chronic emphysematous changes, pulmonary scarring and left upper lobe scarring and radiation. No acute overlying pulmonary process. Electronically Signed   By: Marijo Sanes M.D.   On: 04/14/2018 08:10   Ct Head Wo Contrast  Result Date: 04/14/2018 CLINICAL DATA:  Tremors and altered mental status EXAM: CT HEAD WITHOUT CONTRAST TECHNIQUE: Contiguous axial images were obtained from the  base of the skull through the vertex without intravenous contrast. COMPARISON:  March 27, 2018 FINDINGS: Brain: Mild diffuse atrophy is stable. There is no intracranial mass, hemorrhage, extra-axial fluid collection, or midline shift. There is patchy small vessel disease in the centra semiovale bilaterally. There is an apparent old infarct in the peripheral left temporal-occipital region, stable. No acute infarct is appreciable. Vascular: There is no evident vascular calcification. There is calcification in each distal vertebral artery and carotid siphon region. Skull: Bony  calvarium appears intact. Sinuses/Orbits: There is mucosal thickening in multiple ethmoid air cells bilaterally. Other visualized paranasal sinuses are clear. Orbits appear symmetric bilaterally. Other: Mastoid air cells are clear. IMPRESSION: Atrophy with supratentorial small vessel disease. Prior left temporal-occipital infarct. No acute infarct. No mass or hemorrhage. There are foci of arterial vascular calcification. There is mucosal thickening in several ethmoid air cells. Electronically Signed   By: Lowella Grip III M.D.   On: 04/14/2018 10:30   Ct Abdomen Pelvis W Contrast  Result Date: 04/14/2018 CLINICAL DATA:  Abdominal pain, acute, generalized. EXAM: CT ABDOMEN AND PELVIS WITH CONTRAST TECHNIQUE: Multidetector CT imaging of the abdomen and pelvis was performed using the standard protocol following bolus administration of intravenous contrast. CONTRAST:  46mL OMNIPAQUE IOHEXOL 300 MG/ML  SOLN COMPARISON:  10/29/2017 FINDINGS: Lower chest: Moderate sliding hiatal hernia. Extensive coronary calcification. Scarring in the right lower lobe with volume loss and mild bronchiectasis. Mild centrilobular emphysema. Hepatobiliary: No focal liver abnormality.No evidence of biliary obstruction or stone. Pancreas: Unremarkable. Spleen: Unremarkable. Adrenals/Urinary Tract: Negative adrenals. Horseshoe kidney. Bilateral hydroureteronephrosis likely from the very distended bladder which is likely patulous at baseline due to the partially folded appearance. Stomach/Bowel: Formed stool throughout much of the colon, most notably the rectum which is distended to 10 cm and fills the pelvis. No evident bowel inflammation. The appendix is difficult to discretely visualize. Vascular/Lymphatic: Extensive atherosclerotic calcification with high-grade bilateral common femoral and superficial femoral stenosis. No mass or adenopathy. Reproductive:Negative Other: No ascites or pneumoperitoneum. Musculoskeletal: Advanced spinal  degeneration. No acute osseous finding. IMPRESSION: 1. Constipation with rectal impaction and distention to 10 cm. 2. Dilated bladder, possibly related to#1, with bilateral hydronephrosis of the horseshoe kidneys. 3. Chronic findings are described above. Electronically Signed   By: Monte Fantasia M.D.   On: 04/14/2018 10:30   US Renal  Result Date: 04/15/2018 CLINICAL DATA:  Hydronephrosis. EXAM: RENAL / URINARY TRACT ULTRASOUND COMPLETE COMPARISON:  CT abdomen pelvis from yesterday. FINDINGS: Right Kidney: Renal measurements: 10.4 x 4.4 x 4.6 cm = volume: 108 mL . Echogenicity within normal limits. Mild right hydronephrosis is unchanged. No mass visualized. Left Kidney: Renal measurements: 10.6 x 4.2 x 3.7 cm = volume: 86 mL. Echogenicity within normal limits. No mass or hydronephrosis visualized. Bladder: Decompressed by Foley catheter. IMPRESSION: 1. Unchanged mild right hydronephrosis. Resolved left hydronephrosis. Electronically Signed   By: Titus Dubin M.D.   On: 04/15/2018 12:22    Assessment/Plan  #1 history of increased edema with diastolic CHF #- his weight actually is trending down-he is currently on 20 mg of Demadex a day- some of this edema could be dependent related- at this point will monitor secondary to concerns about his renal issues he has chronic kidney disease and creatinine has risen somewhat up to 2.15 which is slightly above his baseline-there is concern that increasing diuretic may aggravate this- since clinically he appears to be stable and weight is trending down will monitor for now but at some point may need  some additional Demadex.  This was discussed with the son Elta [place] via phone who expressed understanding.  2.  History of suspected aspiration pneumonia he is on doxycycline through January 27 he also has orders for Mucinex and nebulizers as needed-he appears to be stable in this regards  He also continues on Spiriva and Symbicort  #3 history of chronic kidney  disease as noted above creatinine is slowly rising updated labs are pending for next week.  4.  History of anemia with an element of iron deficiency he has been started on iron this will be updated as well next week hemoglobin was 8.2 on lab done on January 20.  [id number]- of notegreater than 25 minutes spent assessing patient-reviewing his chart and labs-discussing his status with nursing staff as well as with his son Elta [place] via phone- and coordinating and formulating a plan of care-of note greater than 50% of time spent coordinating a plan of care with input as noted above

## 2018-05-05 ENCOUNTER — Ambulatory Visit (HOSPITAL_COMMUNITY)
Admission: RE | Admit: 2018-05-05 | Discharge: 2018-05-05 | Disposition: A | Discharge status: Home / Self Care | Payer: Medicare Other | Source: Ambulatory Visit | Attending: Internal Medicine | Admitting: Internal Medicine

## 2018-05-05 ENCOUNTER — Other Ambulatory Visit: Payer: Self-pay

## 2018-05-05 ENCOUNTER — Ambulatory Visit (HOSPITAL_COMMUNITY): Payer: Medicare Other | Attending: Internal Medicine | Admitting: Speech Pathology

## 2018-05-05 ENCOUNTER — Encounter (HOSPITAL_COMMUNITY): Payer: Self-pay | Admitting: Speech Pathology

## 2018-05-05 ENCOUNTER — Encounter (HOSPITAL_COMMUNITY): Payer: Self-pay

## 2018-05-05 DIAGNOSIS — R1312 Dysphagia, oropharyngeal phase: Secondary | ICD-10-CM | POA: Insufficient documentation

## 2018-05-05 DIAGNOSIS — R1319 Other dysphagia: Secondary | ICD-10-CM

## 2018-05-05 NOTE — Therapy (Signed)
[place], [place], [address] Phone: [phone]   Fax:  [phone]  Modified Barium Swallow  Patient Details  Name: [name] MRN: [id number] Date of Birth: 1921/06/26 No data recorded  Encounter Date: 05/05/2018  End of Session - 05/05/18 1532    Visit Number  1    Number of Visits  1    Authorization Type  [employer] Medicare    SLP Start Time  1300    SLP Stop Time   1330    SLP Time Calculation (min)  30 min    Activity Tolerance  Patient tolerated treatment well       Past Medical History:  Diagnosis Date  . ASCVD (arteriosclerotic cardiovascular disease)     CABG in 04/1993; and negative stress nuclear study in 08/2001  . Benign essential tremor   . CAD (coronary artery disease)   . Cancer ([place])    skin  . Cerebrovascular disease    Right carotid bruit-40-69% left internal carotid artery stenosis in 4/06; followed [facility]  . Chronic kidney disease, stage II (mild)    Creatinine-1.6 in 09/2008  . COPD (chronic obstructive pulmonary disease) ([place])   . Degenerative joint disease of knee, left   . Gout   . Hiatal hernia   . Horseshoe kidney   . Hyperlipidemia   . Hypertension   . Lung cancer ([place]) 2013  . Normocytic anemia   . Prediabetes   . Pulmonary disease   . Renal insufficiency   . Syncope   . Tobacco abuse, in remission    40 pack years; discontinued in 1980    Past Surgical History:  Procedure Laterality Date  . CARDIAC SURGERY    . COLONOSCOPY W/ POLYPECTOMY  1985  . CORONARY ARTERY BYPASS GRAFT  1995  . LESION EXCISION    . PILONIDAL CYST EXCISION  1948    There were no vitals filed for this visit.  Subjective Assessment - 05/05/18 1522    Subjective  "Fine."    Currently in Pain?  No/denies          General - 05/05/18 1522      General Information   Date of Onset  05/02/18    HPI  85 y.o.malewith medical history ofcoronary artery disease, COPD, Parkinson's disease,  chronic diastolic CHF, horseshoe kidney, hypertension, hyperlipidemia, squamous cell lung cancer, and cognitive impairment presenting with altered mental status. The patient has been having mechanical falls for the last several months. The patient's son brought the patient to the emergency department on 03/27/2018. Work-up at that time revealed that the patient had an odontoid fracture. Neurosurgery was consulted at that time and felt the patient would benefit from nonoperative management. The patient was placed in an Aspen collar and discharged home. Pt was [facility] early January with increased cognitive decline and was discharged to [facility] for rehab. He is referred for MBSS due to suspicion of aspiration PNA.    Type of Study  MBS-Modified Barium Swallow Study    Diet Prior to this Study  Dysphagia 3 (soft);Thin liquids    Temperature Spikes Noted  No    Respiratory Status  Room air    History of Recent Intubation  No    Behavior/Cognition  Alert;Cooperative;Requires cueing    Oral Cavity Assessment  Within Functional Limits    Oral Care Completed by SLP  No    Oral Cavity - Dentition  Missing dentition  Vision  Functional for self feeding    Self-Feeding Abilities  Able to feed self;Needs assist    Patient Positioning  Upright in chair    Baseline Vocal Quality  Hoarse    Volitional Cough  Strong    Volitional Swallow  Able to elicit    Anatomy  Within functional limits    Pharyngeal Secretions  Not observed secondary MBS   barium appeared to mix with thick secretions        Oral Preparation/Oral Phase - 05/05/18 1528      Oral Preparation/Oral Phase   Oral Phase  Impaired      Oral - Thin   Oral - Thin Cup  Decreased bolus cohesion      Oral - Solids   Oral - Regular  Imparied mastication;Decreased bolus cohesion;Delayed A-P transit;Oral residue      Electrical stimulation - Oral Phase   Was Electrical Stimulation Used  No       Pharyngeal Phase - 05/05/18  1528      Pharyngeal Phase   Pharyngeal Phase  Impaired      Pharyngeal - Nectar   Pharyngeal- Nectar Cup  Swallow initiation at vallecula;Reduced epiglottic inversion;Reduced tongue base retraction;Pharyngeal residue - valleculae    Pharyngeal- Nectar Straw  Swallow initiation at vallecula;Reduced tongue base retraction;Pharyngeal residue - valleculae      Pharyngeal - Thin   Pharyngeal- Thin Teaspoon  Swallow initiation at vallecula;Reduced epiglottic inversion;Reduced tongue base retraction;Pharyngeal residue - valleculae    Pharyngeal- Thin Cup  Swallow initiation at vallecula;Swallow initiation at pyriform sinus;Reduced epiglottic inversion;Reduced tongue base retraction;Reduced airway/laryngeal closure;Penetration/Aspiration before swallow;Penetration/Aspiration during swallow;Trace aspiration;Pharyngeal residue - valleculae    Pharyngeal  Material does not enter airway;Material enters airway, remains ABOVE vocal cords then ejected out;Material enters airway, passes BELOW cords then ejected out;Material enters airway, CONTACTS cords and not ejected out      Pharyngeal - Solids   Pharyngeal- Puree  Within functional limits    Pharyngeal- Regular  Reduced epiglottic inversion;Reduced tongue base retraction;Pharyngeal residue - valleculae    Pharyngeal- Pill  Within functional limits   in puree, transiently delayed in valleculae     Electrical Stimulation - Pharyngeal Phase   Was Electrical Stimulation Used  No       Cricopharyngeal Phase - 05/05/18 1531      Cervical Esophageal Phase   Cervical Esophageal Phase  Impaired      Cervical Esophageal Phase - Thin   Thin Cup  Prominent cricopharyngeal segment        Plan - 05/05/18 1533    Clinical Impression Statement  Pt presents with moderate oropharyngeal phase dysphagia characterized by reduced bolus cohesiveness with liquids, impaired mastication, premature spillage, decreased tongue base retraction and epiglottic deflection  resulting in variable min penetration x2 to the vocal folds with cup sips thin from vallecular residuals spilling to pyriforms and around arytenoids. Pt had one episode where contrast extended below cords and was cleared with a cued cough. Pt achieved improved epiglottic deflection with puree and nectar-thickened liquids. Pt with mild/mod vallecular residue with regular solids which cleared with repeat/dry swallow.  Pt reportedly with suspected aspiration PNA, however no gross aspiration observed during today's assessment. Pt is likely to experience trace aspiration from residuals after the swallow and/or from secretions. Pt wears cervical collar throughout the day at this time. Consider placing Pt on nectar-thick liquids given the above with dysphagia therapy to focus on effortful swallow, tongue base strengthening, and implementation of strategies (complete  dry swallows and clear throat periodically) until Pt is stronger overall. Swallow function was WNL during puree swallows. Recommend D3/mech soft and nectar-thick liquids; consider repeat objective study in 4-6 weeks or as clinically appropriate.      Patient will benefit from skilled therapeutic intervention in order to improve the following deficits and impairments:   Dysphagia, oropharyngeal phase    Recommendations/Treatment - 05/05/18 1531      Swallow Evaluation Recommendations   SLP Diet Recommendations  Dysphagia 3 (mechanical soft);Nectar    Thickener user  Simply thick    Liquid Administration via  Cup    Medication Administration  Whole meds with puree    Supervision  Patient able to self feed;Full supervision/cueing for compensatory strategies    Compensations  Slow rate;Multiple dry swallows after each bite/sip;Clear throat intermittently;Effortful swallow    Postural Changes  Seated upright at 90 degrees;Remain upright for at least 30 minutes after feeds/meals         Problem List Patient Active Problem List   Diagnosis  Date Noted  . Recurrent falls 04/25/2018  . Protein-calorie malnutrition, severe 04/18/2018  . Hydronephrosis   . Syncope and collapse   . Acute metabolic encephalopathy 15/94/5859  . Failure to thrive in adult 04/14/2018  . Acute urinary retention 04/14/2018  . Fecal impaction ([place]) 04/14/2018  . Goals of care, counseling/discussion   . Palliative care by specialist   . DNR (do not resuscitate) discussion   . Gout 01/14/2018  . Lobar pneumonia, unspecified organism ([place]) 10/29/2017  . Pleural effusion, right   . Pedal edema 05/14/2017  . Parkinson's disease ([place]) 10/05/2016  . Diastolic CHF, chronic ([place]) 05/10/2015  . Dizziness   . Coronary artery disease involving coronary bypass graft of native heart without angina pectoris   . Near syncope 04/02/2015  . Diarrhea 04/02/2015  . Unilateral complete paralysis of vocal cord 11/21/2013  . COPD exacerbation ([place]) 06/04/2013  . Shortness of breath 06/04/2013  . Acute respiratory failure with hypoxia ([place]) 06/04/2013  . Hemoptysis 08/10/2012  . Malignant neoplasm of upper lobe, left bronchus or lung ([place]) 01/06/2012  . Prediabetes 08/28/2011  . Lung cancer ([place]) 08/25/2011  . COPD (chronic obstructive pulmonary disease) ([place]) 08/25/2011  . CKD (chronic kidney disease), stage III ([place]) 12/07/2010  . Chest pain 08/18/2010  . Abdominal pain 08/18/2010  . Syncope   . Tobacco abuse, in remission   . Degenerative joint disease of knee, left   . COLONIC POLYPS 05/20/2009  . Hyperlipidemia 05/20/2009  . ANEMIA 05/20/2009  . Essential tremor 05/20/2009  . ATHEROSCLEROTIC CARDIOVASCULAR DISEASE 05/20/2009  . CEREBROVASCULAR DISEASE 05/20/2009  . Essential hypertension 01/14/2009   Thank you,  Genene Churn, [place]  [facility] 05/05/2018, 3:39 PM  [place] [address] [place], [place], [address] Phone: [phone]   Fax:  [phone]  Name: [name] MRN:  [id number] Date of Birth: 02-13-1922

## 2018-05-09 ENCOUNTER — Encounter: Payer: Self-pay | Admitting: Internal Medicine

## 2018-05-09 ENCOUNTER — Encounter (HOSPITAL_COMMUNITY)
Admission: RE | Admit: 2018-05-09 | Discharge: 2018-05-09 | Disposition: A | Discharge status: Home / Self Care | Payer: Medicare Other | Source: Skilled Nursing Facility | Attending: *Deleted | Admitting: *Deleted

## 2018-05-09 LAB — CBC WITH DIFFERENTIAL/PLATELET
Abs Immature Granulocytes: 0.03 10*3/uL (ref 0.00–0.07)
Basophils Absolute: 0 10*3/uL (ref 0.0–0.1)
Basophils Relative: 0 %
Eosinophils Absolute: 0.1 10*3/uL (ref 0.0–0.5)
Eosinophils Relative: 1 %
HCT: 24.6 % — ABNORMAL LOW (ref 39.0–52.0)
Hemoglobin: 7.4 g/dL — ABNORMAL LOW (ref 13.0–17.0)
Immature Granulocytes: 0 %
Lymphocytes Relative: 10 %
Lymphs Abs: 0.9 10*3/uL (ref 0.7–4.0)
MCH: 29.7 pg (ref 26.0–34.0)
MCHC: 30.1 g/dL (ref 30.0–36.0)
MCV: 98.8 fL (ref 80.0–100.0)
Monocytes Absolute: 0.8 10*3/uL (ref 0.1–1.0)
Monocytes Relative: 8 %
Neutro Abs: 7.9 10*3/uL — ABNORMAL HIGH (ref 1.7–7.7)
Neutrophils Relative %: 81 %
Platelets: 316 10*3/uL (ref 150–400)
RBC: 2.49 MIL/uL — ABNORMAL LOW (ref 4.22–5.81)
RDW: 16.7 % — AB (ref 11.5–15.5)
WBC: 9.8 10*3/uL (ref 4.0–10.5)
nRBC: 0 % (ref 0.0–0.2)

## 2018-05-09 LAB — BASIC METABOLIC PANEL
Anion gap: 8 (ref 5–15)
BUN: 54 mg/dL — ABNORMAL HIGH (ref 8–23)
CALCIUM: 8.5 mg/dL — AB (ref 8.9–10.3)
CO2: 29 mmol/L (ref 22–32)
Chloride: 106 mmol/L (ref 98–111)
Creatinine, Ser: 1.82 mg/dL — ABNORMAL HIGH (ref 0.61–1.24)
GFR calc Af Amer: 36 mL/min — ABNORMAL LOW (ref >60)
GFR calc non Af Amer: 31 mL/min — ABNORMAL LOW (ref >60)
Glucose, Bld: 87 mg/dL (ref 70–99)
Potassium: 3.7 mmol/L (ref 3.5–5.1)
Sodium: 143 mmol/L (ref 135–145)

## 2018-05-09 NOTE — Progress Notes (Signed)
Location:    [facility] Room Number: 125/P Place of Service:  SNF [phone]) Provider:  Ewell Poe, MD  Patient Care Team: Kathyrn Drown, MD as PCP - General [facility] Medicine)  Extended Emergency Contact Information Primary Emergency Contact: [facility] Address: [address]          [place], [place] [address] [place] of [place] Phone: [phone] Mobile Phone: [phone] Relation: Son Secondary Emergency Contact: [facility] Address: [address]          [place], [place] [address] [place] Phone: [phone] Relation: Spouse  Code Status:  DNR Goals of care: Advanced Directive information Advanced Directives 05/09/2018  Does Patient Have a Medical Advance Directive? Yes  Type of Advance Directive Out of facility DNR (pink MOST or yellow form)  Does patient want to make changes to medical advance directive? No - Patient declined  Copy of [place] in Chart? -  Would patient like information on creating a medical advance directive? No - Patient declined  Pre-existing out of facility DNR order (yellow form or pink MOST form) -     Chief Complaint  Patient presents with  . Acute Visit    Anemia    HPI:  Pt is a 85 y.o. male seen today for an acute visit for    Past Medical History:  Diagnosis Date  . ASCVD (arteriosclerotic cardiovascular disease)     CABG in 04/1993; and negative stress nuclear study in 08/2001  . Benign essential tremor   . CAD (coronary artery disease)   . Cancer ([place])    skin  . Cerebrovascular disease    Right carotid bruit-40-69% left internal carotid artery stenosis in 4/06; followed [facility]  . Chronic kidney disease, stage II (mild)    Creatinine-1.6 in 09/2008  . COPD (chronic obstructive pulmonary disease) ([place])   . Degenerative joint disease of knee, left   . Gout   . Hiatal hernia   . Horseshoe kidney   . Hyperlipidemia   . Hypertension   . Lung  cancer ([place]) 2013  . Normocytic anemia   . Prediabetes   . Pulmonary disease   . Renal insufficiency   . Syncope   . Tobacco abuse, in remission    40 pack years; discontinued in 1980   Past Surgical History:  Procedure Laterality Date  . CARDIAC SURGERY    . COLONOSCOPY W/ POLYPECTOMY  1985  . CORONARY ARTERY BYPASS GRAFT  1995  . LESION EXCISION    . PILONIDAL CYST EXCISION  1948    Allergies  Allergen Reactions  . Propranolol Nausea Only  . Levaquin [Levofloxacin In D5w] Hives and Nausea And Vomiting  . Penicillins Hives and Swelling    Patient has tolerated cephalosporins several times  . Sulfonamide Derivatives Hives and Swelling  . Sympathomimetics Other (See Comments)  . Tape Other (See Comments)    SKIN IS VERY THIN AND TEARS EASILY; PLEASE USE AN ALTERNATIVE TO TAPE!!    Outpatient Encounter Medications as of 05/09/2018  Medication Sig  . albuterol (PROVENTIL) (2.5 MG/3ML) 0.083% nebulizer solution Inhale 3 mLs into the lungs every 4 (four) hours as needed for wheezing or shortness of breath.  Marland Kitchen albuterol (PROVENTIL) (2.5 MG/3ML) 0.083% nebulizer solution Take 2.5 mg by nebulization every 6 (six) hours. For 48 hours from 05/01/2018-05/03/2018  . Artificial Tear Solution (SOOTHE XP) SOLN Place 2 drops into both eyes 2 (two) times daily as needed (  for dry eyes).   Marland Kitchen aspirin EC 81 MG tablet Take 81 mg by mouth daily.  Marland Kitchen atorvastatin (LIPITOR) 40 MG tablet TAKE 1 TABLET (40 MG TOTAL) BY MOUTH DAILY.  . budesonide-formoterol (SYMBICORT) 160-4.5 MCG/ACT inhaler INHALE 2 PUFF INTO THE LUNGS 2 TIMES DAILY.  . carbidopa-levodopa (SINEMET IR) 25-100 MG tablet Take three tablets by mouth three times daily  . doxycycline (VIBRAMYCIN) 100 MG capsule Take 100 mg by mouth 2 (two) times daily. For 7 days from 1/20/220-/27/2020  . Ferrous Sulfate (IRON) 325 (65 Fe) MG TABS Take 1 tablet by mouth twice a day  . guaiFENesin (MUCINEX) 600 MG 12 hr tablet Take 1 tablet (600 mg total) by  mouth 2 (two) times daily.  . metoprolol tartrate (LOPRESSOR) 25 MG tablet TAKE 1 TABLET BY MOUTH TWICE A DAY  . Multiple Vitamins-Minerals (CENTRUM SILVER 50+MEN) TABS Take 1 tablet by mouth daily.  . nitroGLYCERIN (NITROSTAT) 0.4 MG SL tablet Place 1 tablet (0.4 mg total) under the tongue every 5 (five) minutes as needed for chest pain. May take up to 3 doses per episode.  . pantoprazole (PROTONIX) 40 MG tablet TAKE 1 TABLET BY MOUTH DAILY.  Marland Kitchen polyethylene glycol (MIRALAX / GLYCOLAX) packet Take 17 g by mouth daily.  . potassium chloride (K-DUR,KLOR-CON) 10 MEQ tablet Take 10 mEq by mouth daily.  . Probiotic Product (RISA-BID PROBIOTIC) TABS Take 1 tablet by mouth twice a day  . senna (SENOKOT) 8.6 MG TABS tablet Take 2 tablets (17.2 mg total) by mouth at bedtime.  . tamsulosin (FLOMAX) 0.4 MG CAPS capsule TAKE ONE CAPSULE BY MOUTH AT BEDTIME.  Marland Kitchen tiotropium (SPIRIVA) 18 MCG inhalation capsule Place 1 capsule (18 mcg total) into inhaler and inhale daily.  Marland Kitchen torsemide (DEMADEX) 20 MG tablet Take 20 mg by mouth daily.  . [DISCONTINUED] Balsam [place]-Castor Oil (VENELEX) OINT Apply to sacrum and bilateral every shift prn for prevention  . [DISCONTINUED] feeding supplement, ENSURE ENLIVE, (ENSURE ENLIVE) LIQD Take 237 mLs by mouth 2 (two) times daily between meals.   No facility-administered encounter medications on file as of 05/09/2018.     Review of Systems  Immunization History  Administered Date(s) Administered  . Influenza Split 01/10/2013  . Influenza,inj,Quad PF,6+ Mos 01/10/2014, 01/07/2015, 01/09/2016, 02/09/2017, 12/14/2017  . Influenza-Unspecified 01/12/2012  . Pneumococcal Conjugate-13 10/27/2013  . Pneumococcal Polysaccharide-23 01/11/2001, 07/13/2007  . Td 11/26/2004, 05/23/2015  . Tdap 11/22/2014  . Zoster 02/28/2008  . Zoster Recombinat (Shingrix) 03/18/2018   Pertinent  Health Maintenance Due  Topic Date Due  . INFLUENZA VACCINE  Completed  . PNA vac Low Risk Adult   Completed   Fall Risk  09/06/2015 01/07/2015 04/20/2014 09/19/2013  Falls in the past year? No No Yes No  Number falls in past yr: - - 2 or more -  Risk Factor Category  - - High Fall Risk -  Risk for fall due to : - - History of fall(s);Impaired mobility -   Functional Status Survey:    Vitals:   05/09/18 1550  BP: (!) 105/54  Pulse: 69  Resp: 18  Temp: (!) 97.5 F (36.4 C)  TempSrc: Oral  SpO2: 91%   There is no height or weight on file to calculate BMI. Physical Exam  Labs reviewed: Recent Labs    05/10/17 1045  10/29/17 1112  04/25/18 0839 05/02/18 0800 05/09/18 0130  NA  --    < > 139   < > 137 141 143  K  --    < >  4.1   < > 4.4 4.3 3.7  CL  --    < > 103   < > 108 104 106  CO2  --    < > 25   < > 24 26 29   GLUCOSE  --    < > 124*   < > 81 121* 87  BUN  --    < > 57*   < > 52* 57* 54*  CREATININE  --    < > 2.20*   < > 1.63* 2.15* 1.82*  CALCIUM 8.6   < > 9.1   < > 8.5* 8.8* 8.5*  MG 2.5*  --  2.2  --   --   --   --    < > = values in this interval not displayed.   Recent Labs    03/16/18 0836 03/27/18 0828 04/14/18 0832  AST 16 22 20   ALT 4 5 5   ALKPHOS 83 72 81  BILITOT 0.4 0.9 0.5  PROT 5.6* 6.3* 6.2*  ALBUMIN 3.6 3.3* 3.3*   Recent Labs    04/25/18 0839 05/02/18 0800 05/09/18 0130  WBC 11.6* 14.3* 9.8  NEUTROABS 9.5* 12.9* 7.9*  HGB 8.1* 8.2* 7.4*  HCT 25.9* 26.7* 24.6*  MCV 96.6 98.2 98.8  PLT 274 329 316   Lab Results  Component Value Date   TSH 7.565 (H) 04/14/2018   Lab Results  Component Value Date   HGBA1C 5.3 01/07/2015   Lab Results  Component Value Date   CHOL 152 03/16/2018   HDL 81 03/16/2018   LDLCALC 62 03/16/2018   TRIG 45 03/16/2018   CHOLHDL 1.9 03/16/2018    Significant Diagnostic Results in last 30 days:  Dg Chest 2 View  Result Date: 04/14/2018 CLINICAL DATA:  Weakness and syncope. Fell today. History of lung cancer. EXAM: CHEST - 2 VIEW COMPARISON:  Multiple prior chest x-rays from 2019 and 2018. FINDINGS:  The cardiac silhouette, mediastinal and hilar contours are within normal limits and stable. Moderate tortuosity and calcification of the thoracic aorta is again demonstrated. Prior bypass surgery. Chronic left apical scarring changes and probable radiation changes. Underlying emphysema. No acute overlying pulmonary findings. Chronic right basilar scarring changes and pleural thickening. The bony thorax is intact. IMPRESSION: Chronic emphysematous changes, pulmonary scarring and left upper lobe scarring and radiation. No acute overlying pulmonary process. Electronically Signed   By: Marijo Sanes M.D.   On: 04/14/2018 08:10   Ct Head Wo Contrast  Result Date: 04/14/2018 CLINICAL DATA:  Tremors and altered mental status EXAM: CT HEAD WITHOUT CONTRAST TECHNIQUE: Contiguous axial images were obtained from the base of the skull through the vertex without intravenous contrast. COMPARISON:  March 27, 2018 FINDINGS: Brain: Mild diffuse atrophy is stable. There is no intracranial mass, hemorrhage, extra-axial fluid collection, or midline shift. There is patchy small vessel disease in the centra semiovale bilaterally. There is an apparent old infarct in the peripheral left temporal-occipital region, stable. No acute infarct is appreciable. Vascular: There is no evident vascular calcification. There is calcification in each distal vertebral artery and carotid siphon region. Skull: Bony calvarium appears intact. Sinuses/Orbits: There is mucosal thickening in multiple ethmoid air cells bilaterally. Other visualized paranasal sinuses are clear. Orbits appear symmetric bilaterally. Other: Mastoid air cells are clear. IMPRESSION: Atrophy with supratentorial small vessel disease. Prior left temporal-occipital infarct. No acute infarct. No mass or hemorrhage. There are foci of arterial vascular calcification. There is mucosal thickening in several ethmoid air cells. Electronically  Signed   By: Lowella Grip III M.D.   On:  04/14/2018 10:30   Ct Abdomen Pelvis W Contrast  Result Date: 04/14/2018 CLINICAL DATA:  Abdominal pain, acute, generalized. EXAM: CT ABDOMEN AND PELVIS WITH CONTRAST TECHNIQUE: Multidetector CT imaging of the abdomen and pelvis was performed using the standard protocol following bolus administration of intravenous contrast. CONTRAST:  85mL OMNIPAQUE IOHEXOL 300 MG/ML  SOLN COMPARISON:  10/29/2017 FINDINGS: Lower chest: Moderate sliding hiatal hernia. Extensive coronary calcification. Scarring in the right lower lobe with volume loss and mild bronchiectasis. Mild centrilobular emphysema. Hepatobiliary: No focal liver abnormality.No evidence of biliary obstruction or stone. Pancreas: Unremarkable. Spleen: Unremarkable. Adrenals/Urinary Tract: Negative adrenals. Horseshoe kidney. Bilateral hydroureteronephrosis likely from the very distended bladder which is likely patulous at baseline due to the partially folded appearance. Stomach/Bowel: Formed stool throughout much of the colon, most notably the rectum which is distended to 10 cm and fills the pelvis. No evident bowel inflammation. The appendix is difficult to discretely visualize. Vascular/Lymphatic: Extensive atherosclerotic calcification with high-grade bilateral common femoral and superficial femoral stenosis. No mass or adenopathy. Reproductive:Negative Other: No ascites or pneumoperitoneum. Musculoskeletal: Advanced spinal degeneration. No acute osseous finding. IMPRESSION: 1. Constipation with rectal impaction and distention to 10 cm. 2. Dilated bladder, possibly related to#1, with bilateral hydronephrosis of the horseshoe kidneys. 3. Chronic findings are described above. Electronically Signed   By: Monte Fantasia M.D.   On: 04/14/2018 10:30   Dg Op Swallowing Func-medicare/speech Path  Result Date: 05/05/2018 [facility] [address] [place], [place], [address] Phone: [phone]   Fax:  [phone]  Modified Barium Swallow Patient Details Name: ALEEM ELZA MRN: [id number] Date of Birth: 1921-12-20 No data recorded Encounter Date: 05/05/2018 End of Session - 05/05/18 1532   Visit Number  1   Number of Visits  1   Authorization Type  [employer] Medicare   SLP Start Time  1300   SLP Stop Time   1330   SLP Time Calculation (min)  30 min   Activity Tolerance  Patient tolerated treatment well   Past Medical History: Diagnosis Date . ASCVD (arteriosclerotic cardiovascular disease)    CABG in 04/1993; and negative stress nuclear study in 08/2001 . Benign essential tremor  . CAD (coronary artery disease)  . Cancer ([place])   skin . Cerebrovascular disease   Right carotid bruit-40-69% left internal carotid artery stenosis in 4/06; followed [facility] . Chronic kidney disease, stage II (mild)   Creatinine-1.6 in 09/2008 . COPD (chronic obstructive pulmonary disease) ([place])  . Degenerative joint disease of knee, left  . Gout  . Hiatal hernia  . Horseshoe kidney  . Hyperlipidemia  . Hypertension  . Lung cancer ([place]) 2013 . Normocytic anemia  . Prediabetes  . Pulmonary disease  . Renal insufficiency  . Syncope  . Tobacco abuse, in remission   40 pack years; discontinued in 1980 Past Surgical History: Procedure Laterality Date . CARDIAC SURGERY   . COLONOSCOPY W/ POLYPECTOMY  1985 . CORONARY ARTERY BYPASS GRAFT  1995 . LESION EXCISION   . PILONIDAL CYST EXCISION  1948 There were no vitals filed for this visit. Subjective Assessment - 05/05/18 1522   Subjective  "Fine."   Currently in Pain?  No/denies   General - 05/05/18 1522    General Information  Date of Onset  05/02/18   HPI  85 y.o.malewith medical history ofcoronary artery disease, COPD, Parkinson's disease, chronic diastolic CHF, horseshoe kidney, hypertension, hyperlipidemia, squamous cell  lung cancer, and cognitive impairment presenting with altered mental status. The patient has been having mechanical falls for the last several months. The patient's son brought the patient to the  emergency department on 03/27/2018. Work-up at that time revealed that the patient had an odontoid fracture. Neurosurgery was consulted at that time and felt the patient would benefit from nonoperative management. The patient was placed in an Aspen collar and discharged home. Pt was readmitted to [facility] in early January with increased cognitive decline and was discharged to [facility] for rehab. He is referred for MBSS due to suspicion of aspiration PNA.   Type of Study  MBS-Modified Barium Swallow Study   Diet Prior to this Study  Dysphagia 3 (soft);Thin liquids   Temperature Spikes Noted  No   Respiratory Status  Room air   History of Recent Intubation  No   Behavior/Cognition  Alert;Cooperative;Requires cueing   Oral Cavity Assessment  Within Functional Limits   Oral Care Completed by SLP  No   Oral Cavity - Dentition  Missing dentition   Vision  Functional for self feeding   Self-Feeding Abilities  Able to feed self;Needs assist   Patient Positioning  Upright in chair   Baseline Vocal Quality  Hoarse   Volitional Cough  Strong   Volitional Swallow  Able to elicit   Anatomy  Within functional limits   Pharyngeal Secretions  Not observed secondary MBS  barium appeared to mix with thick secretions  Oral Preparation/Oral Phase - 05/05/18 1528    Oral Preparation/Oral Phase  Oral Phase  Impaired    Oral - Thin  Oral - Thin Cup  Decreased bolus cohesion    Oral - Solids  Oral - Regular  Imparied mastication;Decreased bolus cohesion;Delayed A-P transit;Oral residue    Electrical stimulation - Oral Phase  Was Electrical Stimulation Used  No   Pharyngeal Phase - 05/05/18 1528    Pharyngeal Phase  Pharyngeal Phase  Impaired    Pharyngeal - Nectar  Pharyngeal- Nectar Cup  Swallow initiation at vallecula;Reduced epiglottic inversion;Reduced tongue base retraction;Pharyngeal residue - valleculae   Pharyngeal- Nectar Straw  Swallow initiation at vallecula;Reduced tongue base retraction;Pharyngeal residue - valleculae    Pharyngeal -  Thin  Pharyngeal- Thin Teaspoon  Swallow initiation at vallecula;Reduced epiglottic inversion;Reduced tongue base retraction;Pharyngeal residue - valleculae   Pharyngeal- Thin Cup  Swallow initiation at vallecula;Swallow initiation at pyriform sinus;Reduced epiglottic inversion;Reduced tongue base retraction;Reduced airway/laryngeal closure;Penetration/Aspiration before swallow;Penetration/Aspiration during swallow;Trace aspiration;Pharyngeal residue - valleculae   Pharyngeal  Material does not enter airway;Material enters airway, remains ABOVE vocal cords then ejected out;Material enters airway, passes BELOW cords then ejected out;Material enters airway, CONTACTS cords and not ejected out    Pharyngeal - Solids  Pharyngeal- Puree  Within functional limits   Pharyngeal- Regular  Reduced epiglottic inversion;Reduced tongue base retraction;Pharyngeal residue - valleculae   Pharyngeal- Pill  Within functional limits  in puree, transiently delayed in valleculae   Electrical Stimulation - Pharyngeal Phase  Was Electrical Stimulation Used  No   Cricopharyngeal Phase - 05/05/18 1531    Cervical Esophageal Phase  Cervical Esophageal Phase  Impaired    Cervical Esophageal Phase - Thin  Thin Cup  Prominent cricopharyngeal segment   Plan - 05/05/18 1533   Clinical Impression Statement  Pt presents with moderate oropharyngeal phase dysphagia characterized by reduced bolus cohesiveness with liquids, impaired mastication, premature spillage, decreased tongue base retraction and epiglottic deflection resulting in variable min penetration x2 to the vocal folds with cup sips  thin from vallecular residuals spilling to pyriforms and around arytenoids. Pt had one episode where contrast extended below cords and was cleared with a cued cough. Pt achieved improved epiglottic deflection with puree and nectar-thickened liquids. Pt with mild/mod vallecular residue with regular solids which cleared with repeat/dry swallow. Pt reportedly with  suspected aspiration PNA, however no gross aspiration observed during today's assessment. Pt is likely to experience trace aspiration from residuals after the swallow and/or from secretions. Pt wears cervical collar throughout the day at this time. Consider placing Pt on nectar-thick liquids given the above with dysphagia therapy to focus on effortful swallow, tongue base strengthening, and implementation of strategies (complete dry swallows and clear throat periodically) until Pt is stronger overall. Swallow function was WNL during puree swallows. Recommend D3/mech soft and nectar-thick liquids; consider repeat objective study in 4-6 weeks or as clinically appropriate.  Patient will benefit from skilled therapeutic intervention in order to improve the following deficits and impairments:  Dysphagia, oropharyngeal phase Recommendations/Treatment - 05/05/18 1531    Swallow Evaluation Recommendations  SLP Diet Recommendations  Dysphagia 3 (mechanical soft);Nectar   Thickener user  Simply thick   Liquid Administration via  Cup   Medication Administration  Whole meds with puree   Supervision  Patient able to self feed;Full supervision/cueing for compensatory strategies   Compensations  Slow rate;Multiple dry swallows after each bite/sip;Clear throat intermittently;Effortful swallow   Postural Changes  Seated upright at 90 degrees;Remain upright for at least 30 minutes after feeds/meals   Problem List Patient Active Problem List  Diagnosis Date Noted . Recurrent falls 04/25/2018 . Protein-calorie malnutrition, severe 04/18/2018 . Hydronephrosis  . Syncope and collapse  . Acute metabolic encephalopathy 09/98/3382 . Failure to thrive in adult 04/14/2018 . Acute urinary retention 04/14/2018 . Fecal impaction ([place]) 04/14/2018 . Goals of care, counseling/discussion  . Palliative care by specialist  . DNR (do not resuscitate) discussion  . Gout 01/14/2018 . Lobar pneumonia, unspecified organism ([place]) 10/29/2017 . Pleural  effusion, right  . Pedal edema 05/14/2017 . Parkinson's disease ([place]) 10/05/2016 . Diastolic CHF, chronic ([place]) 05/10/2015 . Dizziness  . Coronary artery disease involving coronary bypass graft of native heart without angina pectoris  . Near syncope 04/02/2015 . Diarrhea 04/02/2015 . 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 COPD exacerbation ([place]) 06/04/2013 . Shortness of breath 06/04/2013 . Acute respiratory failure with hypoxia ([place]) 06/04/2013 . Hemoptysis 08/10/2012 . Malignant neoplasm of upper lobe, left bronchus or lung ([place]) 01/06/2012 . Prediabetes 08/28/2011 . Lung cancer ([place]) 08/25/2011 . COPD (chronic obstructive pulmonary disease) ([place]) 08/25/2011 . CKD (chronic kidney disease), stage III ([place]) 12/07/2010 . Chest pain 08/18/2010 . Abdominal pain 08/18/2010 . Syncope  . Tobacco abuse, in remission  . Degenerative joint disease of knee, left  . COLONIC POLYPS 05/20/2009 . Hyperlipidemia 05/20/2009 . ANEMIA 05/20/2009 . Essential tremor 05/20/2009 . ATHEROSCLEROTIC CARDIOVASCULAR DISEASE 05/20/2009 . CEREBROVASCULAR DISEASE 05/20/2009 . Essential hypertension 01/14/2009 Thank you, Genene Churn, [facility] 05/05/2018, 3:39 PM [place], [place], [address] Phone: [phone]   Fax:  [phone] Name: JHOVANY WEIDINGER MRN: [id number] Date of Birth: [birth year] CLINICAL DATA:  Dysphagia, history of fall with C2 fracture EXAM: MODIFIED BARIUM SWALLOW TECHNIQUE: Different consistencies of barium were administered orally to the patient by the Speech Pathologist. Imaging of the pharynx was performed in the lateral projection. FLUOROSCOPY TIME:  Fluoroscopy Time:  5 minutes 30 seconds Radiation Exposure Index (if provided by the fluoroscopic device):  33.7 mGy Number of Acquired Spot Images: Multiple fluoroscopic series COMPARISON:  None FINDINGS: Thin liquid-with teaspoon, normal motion is seen without laryngeal  penetration or aspiration. Vallecular residuals and minimal piriform sinus residuals are noted. With sips of thin barium by cup, laryngeal penetration was identified, predominantly from contrast at the arytenoids and piriform sinuses, with contrast extending onto the vocal cords. No aspiration. Less laryngeal penetration was seen with bolus holding prior to swallow. Vallecular residuals persisted. Nectar thick liquid-vallecular residuals. No laryngeal penetration or aspiration. Honey-not evaluated Pure- within normal limits Cracker-within normal limits Pure with cracker-not evaluated Barium tablet-swallow with applesauce without difficulty. Incidentally noted is hypomotility of the thoracic esophagus. IMPRESSION: Laryngeal penetration of thin liquid contrast to the level of the vocal cords for, primarily from contrast at the RIGHT nodes and the piriform sinuses. No aspiration of contrast into the trachea. Remainder of exam unremarkable. Please refer to the Speech Pathologists report for complete details and recommendations. Electronically Signed   By: Lavonia Dana M.D.   On: 05/05/2018 13:34   US Renal  Result Date: 04/15/2018 CLINICAL DATA:  Hydronephrosis. EXAM: RENAL / URINARY TRACT ULTRASOUND COMPLETE COMPARISON:  CT abdomen pelvis from yesterday. FINDINGS: Right Kidney: Renal measurements: 10.4 x 4.4 x 4.6 cm = volume: 108 mL . Echogenicity within normal limits. Mild right hydronephrosis is unchanged. No mass visualized. Left Kidney: Renal measurements: 10.6 x 4.2 x 3.7 cm = volume: 86 mL. Echogenicity within normal limits. No mass or hydronephrosis visualized. Bladder: Decompressed by Foley catheter. IMPRESSION: 1. Unchanged mild right hydronephrosis. Resolved left hydronephrosis. Electronically Signed   By: Titus Dubin M.D.   On: 04/15/2018 12:22    Assessment/Plan There are no diagnoses linked to this encounter.      Oralia Manis, [place]

## 2018-05-10 ENCOUNTER — Encounter: Payer: Self-pay | Admitting: Internal Medicine

## 2018-05-10 ENCOUNTER — Non-Acute Institutional Stay (SKILLED_NURSING_FACILITY): Payer: Medicare Other | Admitting: Internal Medicine

## 2018-05-10 DIAGNOSIS — D508 Other iron deficiency anemias: Secondary | ICD-10-CM

## 2018-05-10 DIAGNOSIS — C3400 Malignant neoplasm of unspecified main bronchus: Secondary | ICD-10-CM

## 2018-05-10 DIAGNOSIS — N183 Chronic kidney disease, stage 3 unspecified: Secondary | ICD-10-CM

## 2018-05-10 DIAGNOSIS — R9389 Abnormal findings on diagnostic imaging of other specified body structures: Secondary | ICD-10-CM | POA: Diagnosis not present

## 2018-05-10 DIAGNOSIS — I5032 Chronic diastolic (congestive) heart failure: Secondary | ICD-10-CM | POA: Diagnosis not present

## 2018-05-10 DIAGNOSIS — I10 Essential (primary) hypertension: Secondary | ICD-10-CM

## 2018-05-10 NOTE — Progress Notes (Signed)
This encounter was created in error - please disregard.

## 2018-05-10 NOTE — Progress Notes (Signed)
Location:    [facility] Room Number: 125/P Place of Service:  SNF [phone]) Provider:  Ewell Poe, MD  Patient Care Team: Kathyrn Drown, MD as PCP - General [facility] Medicine)  Extended Emergency Contact Information Primary Emergency Contact: [facility] Address: [address]          [place], [place] [address] Johnnette Litter of [place] Phone: [phone] Mobile Phone: [phone] Relation: Son Secondary Emergency Contact: [facility] Address: [address]          [place], [place] [address] [place] Phone: [phone] Relation: Spouse  Code Status:  DNR Goals of care: Advanced Directive information Advanced Directives 05/10/2018  Does Patient Have a Medical Advance Directive? Yes  Type of Advance Directive Out of facility DNR (pink MOST or yellow form)  Does patient want to make changes to medical advance directive? No - Patient declined  Copy of [place] in Chart? -  Would patient like information on creating a medical advance directive? No - Patient declined  Pre-existing out of facility DNR order (yellow form or pink MOST form) -     Chief Complaint  Patient presents with  . Acute Visit    Anemia  Also follow-up bilateral infiltrates-chronic kidney disease as well as diastolic CHF HPI:  Pt is a 85 y.o. male seen today for an acute visit for  The above-stated issues.  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  He has a neck brace secondary to a recent odontoid fracture  He is here for rehab after having recurrent falls with possible syncope.  CT scan of the head echo of the heart did not really show any acute process.  Also had acute  urinary retention and continues with an indwelling Foley catheter has failed voiding trials and is followed by urology.  Patient was noted to have some weight gain after his admission with increased edema was started Lasix which has been switched to [facility] has been on 20 mg a day and he has steadily lost some weight actually appears he is lost about 20 pounds this month-.  Edema appears much improved and I suspect we can hold the Demadex for a while at least.  He did have recent cough and chest congestion and was found to have bilateral infiltrates and is on a course of doxycycline which has been extended somewhat secondary to continued cough.--He also continues on Mucinex PRN nebulizers as well as Symbicort and Spiriva  He also has anemia thought to be chronic with his kidney disease- hemoglobin has dropped some at 7.4 appears his baseline runs around 8.  We did test for occult blood today digitally and it was negative  He has been started on on iron secondary to low iron level however this is being held since he is on the doxycycline and there is an interaction      At times still has a cough but apparently this has improved.  Also it appears at times he does have some low blood pressures was listed in the 97W systolically this morning when I checked it manually I got 116/62 which appears to be more his baseline.  In addition to a diuretic he is on Lopressor secondary to  his history of coronary artery disease and CHF.  Currently he is lying in bed comfortably vital signs appear to be stable he has no acute complaints does complain occasionally of shortness of breath but this is not really new      Past Medical History:  Diagnosis Date  . ASCVD (arteriosclerotic cardiovascular disease)     CABG in 04/1993; and negative stress nuclear study in 08/2001  . Benign essential tremor   . CAD (coronary artery disease)   . Cancer ([place])    skin  . Cerebrovascular disease    Right  carotid bruit-40-69% left internal carotid artery stenosis in 4/06; followed [facility]  . Chronic kidney disease, stage II (mild)    Creatinine-1.6 in 09/2008  . COPD (chronic obstructive pulmonary disease) ([place])   . Degenerative joint disease of knee, left   . Gout   . Hiatal hernia   . Horseshoe kidney   . Hyperlipidemia   . Hypertension   . Lung cancer ([place]) 2013  . Normocytic anemia   . Prediabetes   . Pulmonary disease   . Renal insufficiency   . Syncope   . Tobacco abuse, in remission    40 pack years; discontinued in 1980   Past Surgical History:  Procedure Laterality Date  . CARDIAC SURGERY    . COLONOSCOPY W/ POLYPECTOMY  1985  . CORONARY ARTERY BYPASS GRAFT  1995  . LESION EXCISION    . PILONIDAL CYST EXCISION  1948    Allergies  Allergen Reactions  . Propranolol Nausea Only  . Levaquin [Levofloxacin In D5w] Hives and Nausea And Vomiting  . Penicillins Hives and Swelling    Patient has tolerated cephalosporins several times  . Sulfonamide Derivatives Hives and Swelling  . Sympathomimetics Other (See Comments)  . Tape Other (See Comments)    SKIN IS VERY THIN AND TEARS EASILY; PLEASE USE AN ALTERNATIVE TO TAPE!!    Outpatient Encounter Medications as of 05/10/2018  Medication Sig  . albuterol (PROVENTIL) (2.5 MG/3ML) 0.083% nebulizer solution Inhale 3 mLs into the lungs every 4 (four) hours as needed for wheezing or shortness of breath.  Marland Kitchen albuterol (PROVENTIL) (2.5 MG/3ML) 0.083% nebulizer solution Take 2.5 mg by nebulization every 6 (six) hours. For 48 hours from 05/01/2018-05/03/2018  . Artificial Tear Solution (SOOTHE XP) SOLN Place 2 drops into both eyes 2 (two) times daily as needed (for dry eyes).   Marland Kitchen aspirin EC 81 MG tablet Take 81 mg by mouth daily.  Marland Kitchen atorvastatin (LIPITOR) 40 MG tablet TAKE 1 TABLET (40 MG TOTAL) BY MOUTH DAILY.  . budesonide-formoterol (SYMBICORT) 160-4.5 MCG/ACT inhaler INHALE 2 PUFF INTO THE LUNGS 2 TIMES DAILY.  . carbidopa-levodopa  (SINEMET IR) 25-100 MG tablet Take three tablets by mouth three times daily  . doxycycline (VIBRAMYCIN) 100 MG capsule Take 100 mg by mouth 2 (two) times daily. For 7 days from 1/20/220-/27/2020  . Ferrous Sulfate (IRON) 325 (65 Fe) MG TABS Take 1 tablet by mouth twice a day  . guaiFENesin (MUCINEX) 600 MG 12 hr tablet Take 1 tablet (600 mg total) by mouth 2 (two) times daily.  . metoprolol tartrate (LOPRESSOR) 25 MG tablet TAKE 1 TABLET BY MOUTH TWICE A DAY  . Multiple Vitamins-Minerals (CENTRUM SILVER 50+MEN) TABS Take 1 tablet by mouth daily.  . nitroGLYCERIN (NITROSTAT) 0.4 MG SL tablet Place 1 tablet (0.4 mg total) under the tongue every 5 (five) minutes as needed for chest pain. May take up to 3 doses per episode.  Marland Kitchen  pantoprazole (PROTONIX) 40 MG tablet TAKE 1 TABLET BY MOUTH DAILY.  Marland Kitchen polyethylene glycol (MIRALAX / GLYCOLAX) packet Take 17 g by mouth daily.  . potassium chloride (K-DUR,KLOR-CON) 10 MEQ tablet Take 10 mEq by mouth daily.  . Probiotic Product (RISA-BID PROBIOTIC) TABS Take 1 tablet by mouth twice a day  . senna (SENOKOT) 8.6 MG TABS tablet Take 2 tablets (17.2 mg total) by mouth at bedtime.  . tamsulosin (FLOMAX) 0.4 MG CAPS capsule TAKE ONE CAPSULE BY MOUTH AT BEDTIME.  Marland Kitchen tiotropium (SPIRIVA) 18 MCG inhalation capsule Place 1 capsule (18 mcg total) into inhaler and inhale daily.  Marland Kitchen torsemide (DEMADEX) 20 MG tablet Take 20 mg by mouth daily.   No facility-administered encounter medications on file as of 05/10/2018.     Review of Systems  This is somewhat limited since patient is a poor historian.  General is not complaining of fever chills.  He has lost weight.  Skin is not complain of rashes or itching.  Head ears eyes nose mouth and throat is not complain of sore throat or visual changes.  Respiratory occasionally complains of shortness of breath which is somewhat chronic-has an occasional cough.  Cardiac is not complaining of chest pain his edema appears to be  improved.  GI is not complaining of abdominal pain nausea vomiting diarrhea constipation continues to have somewhat of a poor appetite per family.  GU has an indwelling Foley catheter is not complaining of dysuria at this point.  Musculoskeletal does not complain of joint pain currently he does have a neck collar in place does have generalized weakness.  Neurologic positive for weakness he is not complaining of headache dizziness or syncope.  And psych does have some cognitive impairment continues to be pleasant does not appear to be overtly anxious  Immunization History  Administered Date(s) Administered  . Influenza Split 01/10/2013  . Influenza,inj,Quad PF,6+ Mos 01/10/2014, 01/07/2015, 01/09/2016, 02/09/2017, 12/14/2017  . Influenza-Unspecified 01/12/2012  . Pneumococcal Conjugate-13 10/27/2013  . Pneumococcal Polysaccharide-23 01/11/2001, 07/13/2007  . Td 11/26/2004, 05/23/2015  . Tdap 11/22/2014  . Zoster 02/28/2008  . Zoster Recombinat (Shingrix) 03/18/2018   Pertinent  Health Maintenance Due  Topic Date Due  . INFLUENZA VACCINE  Completed  . PNA vac Low Risk Adult  Completed   Fall Risk  09/06/2015 01/07/2015 04/20/2014 09/19/2013  Falls in the past year? No No Yes No  Number falls in past yr: - - 2 or more -  Risk Factor Category  - - High Fall Risk -  Risk for fall due to : - - History of fall(s);Impaired mobility -   Functional Status Survey:    Vitals:   05/10/18 1135  BP: 116/62  Pulse: 72  He is afebrile respirations of 18 Weight is 119.4 pounds  Physical Exam  In general this is a pleasant elderly male in no distress resting comfortably in bed.  His skin is warm and dry.  Eyes visual acuity appears to be intact sclera and conjunctive are clear.  Oropharynx is clear mucous membranes moist.  Chest he does have a neck brace in place.  Chest had some mild congestion but this cleared with cough continues to have some slight bronchial sounds diffuse which is  baseline there is no labored breathing.  Heart is regular rate and rhythm without murmur gallop or rub edema appears to be significantly improved continues to have some mild edema of his right foot as prominently.  Is cool to touch nonerythematous nontender.  His abdomen  is soft nontender with positive bowel sounds.  Musculoskeletal moves his extremities x4 at baseline with lower extremity weakness again he does have a neck brace in place.  Neurologic is grossly intact his speech is clear could not really appreciate lateralizing findings he de is cooperative and turns in bed.  Psych he is oriented to self is pleasant appropriate.     Labs reviewed: Recent Labs    10/29/17 1112  04/25/18 0839 05/02/18 0800 05/09/18 0130  NA 139   < > 137 141 143  K 4.1   < > 4.4 4.3 3.7  CL 103   < > 108 104 106  CO2 25   < > 24 26 29   GLUCOSE 124*   < > 81 121* 87  BUN 57*   < > 52* 57* 54*  CREATININE 2.20*   < > 1.63* 2.15* 1.82*  CALCIUM 9.1   < > 8.5* 8.8* 8.5*  MG 2.2  --   --   --   --    < > = values in this interval not displayed.   Recent Labs    03/16/18 0836 03/27/18 0828 04/14/18 0832  AST 16 22 20   ALT 4 5 5   ALKPHOS 83 72 81  BILITOT 0.4 0.9 0.5  PROT 5.6* 6.3* 6.2*  ALBUMIN 3.6 3.3* 3.3*   Recent Labs    04/25/18 0839 05/02/18 0800 05/09/18 0130  WBC 11.6* 14.3* 9.8  NEUTROABS 9.5* 12.9* 7.9*  HGB 8.1* 8.2* 7.4*  HCT 25.9* 26.7* 24.6*  MCV 96.6 98.2 98.8  PLT 274 329 316   Lab Results  Component Value Date   TSH 7.565 (H) 04/14/2018   Lab Results  Component Value Date   HGBA1C 5.3 01/07/2015   Lab Results  Component Value Date   CHOL 152 03/16/2018   HDL 81 03/16/2018   LDLCALC 62 03/16/2018   TRIG 45 03/16/2018   CHOLHDL 1.9 03/16/2018    Significant Diagnostic Results in last 30 days:  Dg Chest 2 View  Result Date: 04/14/2018 CLINICAL DATA:  Weakness and syncope. Fell today. History of lung cancer. EXAM: CHEST - 2 VIEW COMPARISON:   Multiple prior chest x-rays from 2019 and 2018. FINDINGS: The cardiac silhouette, mediastinal and hilar contours are within normal limits and stable. Moderate tortuosity and calcification of the thoracic aorta is again demonstrated. Prior bypass surgery. Chronic left apical scarring changes and probable radiation changes. Underlying emphysema. No acute overlying pulmonary findings. Chronic right basilar scarring changes and pleural thickening. The bony thorax is intact. IMPRESSION: Chronic emphysematous changes, pulmonary scarring and left upper lobe scarring and radiation. No acute overlying pulmonary process. Electronically Signed   By: Marijo Sanes M.D.   On: 04/14/2018 08:10   Ct Head Wo Contrast  Result Date: 04/14/2018 CLINICAL DATA:  Tremors and altered mental status EXAM: CT HEAD WITHOUT CONTRAST TECHNIQUE: Contiguous axial images were obtained from the base of the skull through the vertex without intravenous contrast. COMPARISON:  March 27, 2018 FINDINGS: Brain: Mild diffuse atrophy is stable. There is no intracranial mass, hemorrhage, extra-axial fluid collection, or midline shift. There is patchy small vessel disease in the centra semiovale bilaterally. There is an apparent old infarct in the peripheral left temporal-occipital region, stable. No acute infarct is appreciable. Vascular: There is no evident vascular calcification. There is calcification in each distal vertebral artery and carotid siphon region. Skull: Bony calvarium appears intact. Sinuses/Orbits: There is mucosal thickening in multiple ethmoid air cells bilaterally. Other visualized  paranasal sinuses are clear. Orbits appear symmetric bilaterally. Other: Mastoid air cells are clear. IMPRESSION: Atrophy with supratentorial small vessel disease. Prior left temporal-occipital infarct. No acute infarct. No mass or hemorrhage. There are foci of arterial vascular calcification. There is mucosal thickening in several ethmoid air cells.  Electronically Signed   By: Lowella Grip III M.D.   On: 04/14/2018 10:30   Ct Abdomen Pelvis W Contrast  Result Date: 04/14/2018 CLINICAL DATA:  Abdominal pain, acute, generalized. EXAM: CT ABDOMEN AND PELVIS WITH CONTRAST TECHNIQUE: Multidetector CT imaging of the abdomen and pelvis was performed using the standard protocol following bolus administration of intravenous contrast. CONTRAST:  80mL OMNIPAQUE IOHEXOL 300 MG/ML  SOLN COMPARISON:  10/29/2017 FINDINGS: Lower chest: Moderate sliding hiatal hernia. Extensive coronary calcification. Scarring in the right lower lobe with volume loss and mild bronchiectasis. Mild centrilobular emphysema. Hepatobiliary: No focal liver abnormality.No evidence of biliary obstruction or stone. Pancreas: Unremarkable. Spleen: Unremarkable. Adrenals/Urinary Tract: Negative adrenals. Horseshoe kidney. Bilateral hydroureteronephrosis likely from the very distended bladder which is likely patulous at baseline due to the partially folded appearance. Stomach/Bowel: Formed stool throughout much of the colon, most notably the rectum which is distended to 10 cm and fills the pelvis. No evident bowel inflammation. The appendix is difficult to discretely visualize. Vascular/Lymphatic: Extensive atherosclerotic calcification with high-grade bilateral common femoral and superficial femoral stenosis. No mass or adenopathy. Reproductive:Negative Other: No ascites or pneumoperitoneum. Musculoskeletal: Advanced spinal degeneration. No acute osseous finding. IMPRESSION: 1. Constipation with rectal impaction and distention to 10 cm. 2. Dilated bladder, possibly related to#1, with bilateral hydronephrosis of the horseshoe kidneys. 3. Chronic findings are described above. Electronically Signed   By: Monte Fantasia M.D.   On: 04/14/2018 10:30   Dg Op Swallowing Func-medicare/speech Path  Result Date: 05/05/2018 [facility] [address] [place], [place], [address] Phone: [phone]   Fax:  [phone] Modified Barium Swallow Patient Details Name: ROSEMARY PENTECOST MRN: [id number] Date of Birth: 1921/04/17 No data recorded Encounter Date: 05/05/2018 End of Session - 05/05/18 1532   Visit Number  1   Number of Visits  1   Authorization Type  [employer] Medicare   SLP Start Time  1300   SLP Stop Time   1330   SLP Time Calculation (min)  30 min   Activity Tolerance  Patient tolerated treatment well   Past Medical History: Diagnosis Date . ASCVD (arteriosclerotic cardiovascular disease)    CABG in 04/1993; and negative stress nuclear study in 08/2001 . Benign essential tremor  . CAD (coronary artery disease)  . Cancer ([place])   skin . Cerebrovascular disease   Right carotid bruit-40-69% left internal carotid artery stenosis in 4/06; followed [facility] . Chronic kidney disease, stage II (mild)   Creatinine-1.6 in 09/2008 . COPD (chronic obstructive pulmonary disease) ([place])  . Degenerative joint disease of knee, left  . Gout  . Hiatal hernia  . Horseshoe kidney  . Hyperlipidemia  . Hypertension  . Lung cancer ([place]) 2013 . Normocytic anemia  . Prediabetes  . Pulmonary disease  . Renal insufficiency  . Syncope  . Tobacco abuse, in remission   40 pack years; discontinued in 1980 Past Surgical History: Procedure Laterality Date . CARDIAC SURGERY   . COLONOSCOPY W/ POLYPECTOMY  1985 . CORONARY ARTERY BYPASS GRAFT  1995 . LESION EXCISION   . PILONIDAL CYST EXCISION  1948 There were no vitals filed for this visit. Subjective Assessment - 05/05/18 1522   Subjective  "  Fine."   Currently in Pain?  No/denies   General - 05/05/18 1522    General Information  Date of Onset  05/02/18   HPI  85 y.o.malewith medical history ofcoronary artery disease, COPD, Parkinson's disease, chronic diastolic CHF, horseshoe kidney, hypertension, hyperlipidemia, squamous cell lung cancer, and cognitive impairment presenting with altered mental status. The patient has been having mechanical falls for the last  several months. The patient's son brought the patient to the emergency department on 03/27/2018. Work-up at that time revealed that the patient had an odontoid fracture. Neurosurgery was consulted at that time and felt the patient would benefit from nonoperative management. The patient was placed in an Aspen collar and discharged home. Pt was readmitted to [facility] in early January with increased cognitive decline and was discharged to [facility] for rehab. He is referred for MBSS due to suspicion of aspiration PNA.   Type of Study  MBS-Modified Barium Swallow Study   Diet Prior to this Study  Dysphagia 3 (soft);Thin liquids   Temperature Spikes Noted  No   Respiratory Status  Room air   History of Recent Intubation  No   Behavior/Cognition  Alert;Cooperative;Requires cueing   Oral Cavity Assessment  Within Functional Limits   Oral Care Completed by SLP  No   Oral Cavity - Dentition  Missing dentition   Vision  Functional for self feeding   Self-Feeding Abilities  Able to feed self;Needs assist   Patient Positioning  Upright in chair   Baseline Vocal Quality  Hoarse   Volitional Cough  Strong   Volitional Swallow  Able to elicit   Anatomy  Within functional limits   Pharyngeal Secretions  Not observed secondary MBS  barium appeared to mix with thick secretions  Oral Preparation/Oral Phase - 05/05/18 1528    Oral Preparation/Oral Phase  Oral Phase  Impaired    Oral - Thin  Oral - Thin Cup  Decreased bolus cohesion    Oral - Solids  Oral - Regular  Imparied mastication;Decreased bolus cohesion;Delayed A-P transit;Oral residue    Electrical stimulation - Oral Phase  Was Electrical Stimulation Used  No   Pharyngeal Phase - 05/05/18 1528    Pharyngeal Phase  Pharyngeal Phase  Impaired    Pharyngeal - Nectar  Pharyngeal- Nectar Cup  Swallow initiation at vallecula;Reduced epiglottic inversion;Reduced tongue base retraction;Pharyngeal residue - valleculae   Pharyngeal- Nectar Straw  Swallow initiation at vallecula;Reduced tongue  base retraction;Pharyngeal residue - valleculae    Pharyngeal - Thin  Pharyngeal- Thin Teaspoon  Swallow initiation at vallecula;Reduced epiglottic inversion;Reduced tongue base retraction;Pharyngeal residue - valleculae   Pharyngeal- Thin Cup  Swallow initiation at vallecula;Swallow initiation at pyriform sinus;Reduced epiglottic inversion;Reduced tongue base retraction;Reduced airway/laryngeal closure;Penetration/Aspiration before swallow;Penetration/Aspiration during swallow;Trace aspiration;Pharyngeal residue - valleculae   Pharyngeal  Material does not enter airway;Material enters airway, remains ABOVE vocal cords then ejected out;Material enters airway, passes BELOW cords then ejected out;Material enters airway, CONTACTS cords and not ejected out    Pharyngeal - Solids  Pharyngeal- Puree  Within functional limits   Pharyngeal- Regular  Reduced epiglottic inversion;Reduced tongue base retraction;Pharyngeal residue - valleculae   Pharyngeal- Pill  Within functional limits  in puree, transiently delayed in valleculae   Electrical Stimulation - Pharyngeal Phase  Was Electrical Stimulation Used  No   Cricopharyngeal Phase - 05/05/18 1531    Cervical Esophageal Phase  Cervical Esophageal Phase  Impaired    Cervical Esophageal Phase - Thin  Thin Cup  Prominent cricopharyngeal segment  Plan - 05/05/18 1533   Clinical Impression Statement  Pt presents with moderate oropharyngeal phase dysphagia characterized by reduced bolus cohesiveness with liquids, impaired mastication, premature spillage, decreased tongue base retraction and epiglottic deflection resulting in variable min penetration x2 to the vocal folds with cup sips thin from vallecular residuals spilling to pyriforms and around arytenoids. Pt had one episode where contrast extended below cords and was cleared with a cued cough. Pt achieved improved epiglottic deflection with puree and nectar-thickened liquids. Pt with mild/mod vallecular residue with regular  solids which cleared with repeat/dry swallow. Pt reportedly with suspected aspiration PNA, however no gross aspiration observed during today's assessment. Pt is likely to experience trace aspiration from residuals after the swallow and/or from secretions. Pt wears cervical collar throughout the day at this time. Consider placing Pt on nectar-thick liquids given the above with dysphagia therapy to focus on effortful swallow, tongue base strengthening, and implementation of strategies (complete dry swallows and clear throat periodically) until Pt is stronger overall. Swallow function was WNL during puree swallows. Recommend D3/mech soft and nectar-thick liquids; consider repeat objective study in 4-6 weeks or as clinically appropriate.  Patient will benefit from skilled therapeutic intervention in order to improve the following deficits and impairments:  Dysphagia, oropharyngeal phase Recommendations/Treatment - 05/05/18 1531    Swallow Evaluation Recommendations  SLP Diet Recommendations  Dysphagia 3 (mechanical soft);Nectar   Thickener user  Simply thick   Liquid Administration via  Cup   Medication Administration  Whole meds with puree   Supervision  Patient able to self feed;Full supervision/cueing for compensatory strategies   Compensations  Slow rate;Multiple dry swallows after each bite/sip;Clear throat intermittently;Effortful swallow   Postural Changes  Seated upright at 90 degrees;Remain upright for at least 30 minutes after feeds/meals   Problem List Patient Active Problem List  Diagnosis Date Noted . Recurrent falls 04/25/2018 . Protein-calorie malnutrition, severe 04/18/2018 . Hydronephrosis  . Syncope and collapse  . Acute metabolic encephalopathy 95/62/1308 . Failure to thrive in adult 04/14/2018 . Acute urinary retention 04/14/2018 . Fecal impaction ([place]) 04/14/2018 . Goals of care, counseling/discussion  . Palliative care by specialist  . DNR (do not resuscitate) discussion  . Gout 01/14/2018 . Lobar  pneumonia, unspecified organism ([place]) 10/29/2017 . Pleural effusion, right  . Pedal edema 05/14/2017 . Parkinson's disease ([place]) 10/05/2016 . Diastolic CHF, chronic ([place]) 05/10/2015 . Dizziness  . Coronary artery disease involving coronary bypass graft of native heart without angina pectoris  . Near syncope 04/02/2015 . Diarrhea 04/02/2015 . 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 COPD exacerbation ([place]) 06/04/2013 . Shortness of breath 06/04/2013 . Acute respiratory failure with hypoxia ([place]) 06/04/2013 . Hemoptysis 08/10/2012 . Malignant neoplasm of upper lobe, left bronchus or lung ([place]) 01/06/2012 . Prediabetes 08/28/2011 . Lung cancer ([place]) 08/25/2011 . COPD (chronic obstructive pulmonary disease) ([place]) 08/25/2011 . CKD (chronic kidney disease), stage III ([place]) 12/07/2010 . Chest pain 08/18/2010 . Abdominal pain 08/18/2010 . Syncope  . Tobacco abuse, in remission  . Degenerative joint disease of knee, left  . COLONIC POLYPS 05/20/2009 . Hyperlipidemia 05/20/2009 . ANEMIA 05/20/2009 . Essential tremor 05/20/2009 . ATHEROSCLEROTIC CARDIOVASCULAR DISEASE 05/20/2009 . CEREBROVASCULAR DISEASE 05/20/2009 . Essential hypertension 01/14/2009 Thank you, Genene Churn, [facility] 05/05/2018, 3:39 PM [place] [address] [place], [place], [address] Phone: [phone]   Fax:  [phone] Name: KNUT RONDINELLI MRN: [id number] Date of Birth: 06-Jan-1922 CLINICAL DATA:  Dysphagia, history of fall with C2 fracture  EXAM: MODIFIED BARIUM SWALLOW TECHNIQUE: Different consistencies of barium were administered orally to the patient by the Speech Pathologist. Imaging of the pharynx was performed in the lateral projection. FLUOROSCOPY TIME:  Fluoroscopy Time:  5 minutes 30 seconds Radiation Exposure Index (if provided by the fluoroscopic device): 33.7 mGy Number of Acquired Spot Images: Multiple fluoroscopic series COMPARISON:  None FINDINGS: Thin  liquid-with teaspoon, normal motion is seen without laryngeal penetration or aspiration. Vallecular residuals and minimal piriform sinus residuals are noted. With sips of thin barium by cup, laryngeal penetration was identified, predominantly from contrast at the arytenoids and piriform sinuses, with contrast extending onto the vocal cords. No aspiration. Less laryngeal penetration was seen with bolus holding prior to swallow. Vallecular residuals persisted. Nectar thick liquid-vallecular residuals. No laryngeal penetration or aspiration. Honey-not evaluated Pure- within normal limits Cracker-within normal limits Pure with cracker-not evaluated Barium tablet-swallow with applesauce without difficulty. Incidentally noted is hypomotility of the thoracic esophagus. IMPRESSION: Laryngeal penetration of thin liquid contrast to the level of the vocal cords for, primarily from contrast at the RIGHT nodes and the piriform sinuses. No aspiration of contrast into the trachea. Remainder of exam unremarkable. Please refer to the Speech Pathologists report for complete details and recommendations. Electronically Signed   By: Lavonia Dana M.D.   On: 05/05/2018 13:34   US Renal  Result Date: 04/15/2018 CLINICAL DATA:  Hydronephrosis. EXAM: RENAL / URINARY TRACT ULTRASOUND COMPLETE COMPARISON:  CT abdomen pelvis from yesterday. FINDINGS: Right Kidney: Renal measurements: 10.4 x 4.4 x 4.6 cm = volume: 108 mL . Echogenicity within normal limits. Mild right hydronephrosis is unchanged. No mass visualized. Left Kidney: Renal measurements: 10.6 x 4.2 x 3.7 cm = volume: 86 mL. Echogenicity within normal limits. No mass or hydronephrosis visualized. Bladder: Decompressed by Foley catheter. IMPRESSION: 1. Unchanged mild right hydronephrosis. Resolved left hydronephrosis. Electronically Signed   By: Titus Dubin M.D.   On: 04/15/2018 12:22    Assessment/Plan  #1-anemia-thought to be of an element of chronic disease he has been on  iron but this is being held because he is on doxycycline- I did test for Hemoccult blood and this was negative today.  Will continue to test for bleeding with guaiac and stools x3- also will repeat labs on Friday, January 31.  I suspect if we can get the iron restarted this may help as well.  2.  History of bilateral lower lobe infiltrates that again he is on doxycycline which has been extended will order repeat chest x-ray tomorrow to see where we stand if there is an improvement I suspect will discontinue the doxycycline and restart the iron.  3.  History of diastolic CHF-his edema and weight appears to be decreased- will for this time DC'd the Demadex with potassium secondary to significant improvement in edema as well as concerns that he may be getting a bit over diuresed.  Again will update labs later this week his renal function actually appears relatively baseline with a creatinine of 1.82.  4.  History of chronic kidney disease creatinine of 1.82--- baseline appears to be in the mid higher ones- at this point will monitor will update the labs I suspect discontinuing the Demadex for now may help with the renal function as well.  5.  History of lung carcinoma I did have a discussion with his son today-this has been followed at [facility] and he did have a CT scan done back in June which showed stable right upper and lower lobe nodules as  well as stable left and right upper lobe opacities--his son did ask about possibly updating a CT scan and I did discuss with him that that would be best done at [facility] and he did express understanding.  He did receive radiation therapy and this was about 6 years ago per his.  Again will update a chest x-ray possibly discontinue the doxycycline and restart iron-continue to guaiac stools x3.  I suspect discontinuing the Demadex will help with his blood pressure as well but this will have to be watched.  [id number] note greater than 40 minutes spent assessing  patient-reviewing his chart and labs-discussing his status with his son-and coordinating and formulating a plan of care- of note greater than 50% of time spent coordinating a plan of care with input as noted above

## 2018-05-12 ENCOUNTER — Encounter: Payer: Self-pay | Admitting: Internal Medicine

## 2018-05-12 ENCOUNTER — Non-Acute Institutional Stay (SKILLED_NURSING_FACILITY): Payer: Medicare Other | Admitting: Internal Medicine

## 2018-05-12 DIAGNOSIS — R9389 Abnormal findings on diagnostic imaging of other specified body structures: Secondary | ICD-10-CM | POA: Diagnosis not present

## 2018-05-12 DIAGNOSIS — I5032 Chronic diastolic (congestive) heart failure: Secondary | ICD-10-CM | POA: Diagnosis not present

## 2018-05-12 DIAGNOSIS — N183 Chronic kidney disease, stage 3 unspecified: Secondary | ICD-10-CM

## 2018-05-12 DIAGNOSIS — D508 Other iron deficiency anemias: Secondary | ICD-10-CM

## 2018-05-12 NOTE — Progress Notes (Signed)
Location:    [facility] Room Number: 125/P Place of Service:  SNF [phone]) Provider:  Ewell Poe, MD  Patient Care Team: Kathyrn Drown, MD as PCP - General [facility] Medicine)  Extended Emergency Contact Information Primary Emergency Contact: [facility] Address: [address]          [place], [place] [address] [place] of [place] Phone: [phone] Mobile Phone: [phone] Relation: Son Secondary Emergency Contact: [facility] Address: [address]          [place], [place] [address] [place] Phone: [phone] Relation: Spouse  Code Status:  DNR Goals of care: Advanced Directive information Advanced Directives 05/12/2018  Does Patient Have a Medical Advance Directive? Yes  Type of Advance Directive Out of facility DNR (pink MOST or yellow form)  Does patient want to make changes to medical advance directive? No - Patient declined  Copy of [place] in Chart? -  Would patient like information on creating a medical advance directive? No - Patient declined  Pre-existing out of facility DNR order (yellow form or pink MOST form) -     Chief Complaint  Patient presents with  . Acute Visit    F/U Chest X-ray  --- With history of bilateral lower lobe infiltrates  HPI:  Pt is a 86 y.o. male seen today for an acute visit for follow-up chest x-ray.  Patient is completing a course of doxycycline secondary to previous chest x-ray showing bilateral lobe infiltrates- we did do an updated chest x-ray yesterday which shows no change in CHF and a mild left effusion but does not show the bilateral lower lobe infiltrates.  He continues to have some cough but does not really complain of shortness of breath beyond baseline oxygen saturations appear to be satisfactory.  She has a complex medical history is here for rehab after hospitalization for recurrent falls with confusion does have a history of  coronary artery disease with CABG in the past as well as hypertension Parkinson's disease diastolic CHF COPD squamous cell carcinoma status post resection and cognitive impairment.  He has a neck brace secondary to recent odontoid fracture.  He is here for rehab after having recurrent falls with possible syncope.  He also has acute urinary retention continues with a Foley catheter this is being followed by urology he is failed voiding trials.  He also has a history of anemia thought to have an element of chronic disease his iron also was low and he was started on iron but this is been held temporarily secondary to interaction with doxycycline.  We have also held his Demadex secondary to significant improvement in his edema and weight loss- he also has some hypotension however blood pressure this morning actually was somewhat elevated with systolic in the 542H.  Currently he is sitting in his wheelchair comfortably continues to have some baseline bronchial sounds but respiratory wise appears to be stable.     Past Medical History:  Diagnosis Date  . ASCVD (arteriosclerotic cardiovascular disease)     CABG in 04/1993; and negative stress nuclear study in 08/2001  . Benign essential tremor   . CAD (coronary artery disease)   . Cancer ([place])    skin  . Cerebrovascular disease    Right carotid bruit-40-69% left internal carotid artery stenosis in 4/06; followed [facility]  . Chronic kidney disease, stage II (mild)    Creatinine-1.6 in 09/2008  . COPD (chronic obstructive pulmonary disease) (  [place])   . Degenerative joint disease of knee, left   . Gout   . Hiatal hernia   . Horseshoe kidney   . Hyperlipidemia   . Hypertension   . Lung cancer ([place]) 2013  . Normocytic anemia   . Prediabetes   . Pulmonary disease   . Renal insufficiency   . Syncope   . Tobacco abuse, in remission    40 pack years; discontinued in 1980   Past Surgical History:  Procedure Laterality Date  . CARDIAC SURGERY      . COLONOSCOPY W/ POLYPECTOMY  1985  . CORONARY ARTERY BYPASS GRAFT  1995  . LESION EXCISION    . PILONIDAL CYST EXCISION  1948    Allergies  Allergen Reactions  . Propranolol Nausea Only  . Levaquin [Levofloxacin In D5w] Hives and Nausea And Vomiting  . Penicillins Hives and Swelling    Patient has tolerated cephalosporins several times  . Sulfonamide Derivatives Hives and Swelling  . Sympathomimetics Other (See Comments)  . Tape Other (See Comments)    SKIN IS VERY THIN AND TEARS EASILY; PLEASE USE AN ALTERNATIVE TO TAPE!!    Outpatient Encounter Medications as of 05/12/2018  Medication Sig  . albuterol (PROVENTIL) (2.5 MG/3ML) 0.083% nebulizer solution Inhale 3 mLs into the lungs every 4 (four) hours as needed for wheezing or shortness of breath.  Marland Kitchen albuterol (PROVENTIL) (2.5 MG/3ML) 0.083% nebulizer solution Take 2.5 mg by nebulization every 6 (six) hours. For 48 hours from 05/01/2018-05/03/2018  . Artificial Tear Solution (SOOTHE XP) SOLN Place 2 drops into both eyes 2 (two) times daily as needed (for dry eyes).   Marland Kitchen aspirin EC 81 MG tablet Take 81 mg by mouth daily.  Marland Kitchen atorvastatin (LIPITOR) 40 MG tablet TAKE 1 TABLET (40 MG TOTAL) BY MOUTH DAILY.  . budesonide-formoterol (SYMBICORT) 160-4.5 MCG/ACT inhaler INHALE 2 PUFF INTO THE LUNGS 2 TIMES DAILY.  . carbidopa-levodopa (SINEMET IR) 25-100 MG tablet Take three tablets by mouth three times daily  . doxycycline (VIBRAMYCIN) 100 MG capsule Take 100 mg by mouth 2 (two) times daily. For 7 days from 1/20/220-/27/2020  . Ferrous Sulfate (IRON) 325 (65 Fe) MG TABS Take 1 tablet by mouth twice a day  . guaiFENesin (MUCINEX) 600 MG 12 hr tablet Take 1 tablet (600 mg total) by mouth 2 (two) times daily.  . metoprolol tartrate (LOPRESSOR) 25 MG tablet TAKE 1 TABLET BY MOUTH TWICE A DAY  . Multiple Vitamins-Minerals (CENTRUM SILVER 50+MEN) TABS Take 1 tablet by mouth daily.  . nitroGLYCERIN (NITROSTAT) 0.4 MG SL tablet Place 1 tablet (0.4 mg  total) under the tongue every 5 (five) minutes as needed for chest pain. May take up to 3 doses per episode.  . pantoprazole (PROTONIX) 40 MG tablet TAKE 1 TABLET BY MOUTH DAILY.  . Probiotic Product (RISA-BID PROBIOTIC) TABS Take 1 tablet by mouth twice a day  . senna (SENOKOT) 8.6 MG TABS tablet Take 2 tablets (17.2 mg total) by mouth at bedtime.  . tamsulosin (FLOMAX) 0.4 MG CAPS capsule TAKE ONE CAPSULE BY MOUTH AT BEDTIME.  Marland Kitchen tiotropium (SPIRIVA) 18 MCG inhalation capsule Place 1 capsule (18 mcg total) into inhaler and inhale daily.  . [DISCONTINUED] polyethylene glycol (MIRALAX / GLYCOLAX) packet Take 17 g by mouth daily.  . [DISCONTINUED] potassium chloride (K-DUR,KLOR-CON) 10 MEQ tablet Take 10 mEq by mouth daily.  . [DISCONTINUED] torsemide (DEMADEX) 20 MG tablet Take 20 mg by mouth daily.   No facility-administered encounter medications on file as  of 05/12/2018.     Review of Systems   I this is limited secondary to dementia.  General is not complaining of fever chills.  Skin no complaints of diaphoresis rashes or itching.  Head ears eyes nose mouth and throat is not complain of sore throat or visual changes.  Respiratory continues to have a cough is not really complaining of shortness of breath beyond baseline is completing doxycycline for bilateral lower lobe infiltrates.  Cardiac is not complaining of chest pain edema appears significantly improved although he does have some pedal edema.  GI is not complaining of abdominal pain nausea vomiting diarrhea constipation.  GU has an indwelling Foley catheter as noted above does not complain of dysuria.  Musculoskeletal has continued lower extremity weakness has the odontoid fracture with neck brace is not really complaining of pain at this point.  Neurologic does not complain of dizziness headache or syncopal type feelings.  And psych continues to be pleasant appropriate does have some cognitive impairment at  baseline    Immunization History  Administered Date(s) Administered  . Influenza Split 01/10/2013  . Influenza,inj,Quad PF,6+ Mos 01/10/2014, 01/07/2015, 01/09/2016, 02/09/2017, 12/14/2017  . Influenza-Unspecified 01/12/2012  . Pneumococcal Conjugate-13 10/27/2013  . Pneumococcal Polysaccharide-23 01/11/2001, 07/13/2007  . Td 11/26/2004, 05/23/2015  . Tdap 11/22/2014  . Zoster 02/28/2008  . Zoster Recombinat (Shingrix) 03/18/2018   Pertinent  Health Maintenance Due  Topic Date Due  . INFLUENZA VACCINE  Completed  . PNA vac Low Risk Adult  Completed   Fall Risk  09/06/2015 01/07/2015 04/20/2014 09/19/2013  Falls in the past year? No No Yes No  Number falls in past yr: - - 2 or more -  Risk Factor Category  - - High Fall Risk -  Risk for fall due to : - - History of fall(s);Impaired mobility -   Functional Status Survey:    Vitals:   05/12/18 1108  BP: (!) 147/61  Pulse: 79  Resp: 18  Temp: (!) 97.4 F (36.3 C)  TempSrc: Oral  SpO2: 96%  Weight is 118.8 pounds Physical Exam   In general this is a pleasant elderly male in no distress sitting comfortably in his wheelchair.  His skin is warm and dry.  Eyes sclera and conjunctive are clear visual acuity appears grossly intact.  Oropharynx is clear mucous membranes moist.  Chest he does have bronchial sounds appears to be baseline there is no labored breathing.  Heart is regular rate and rhythm without murmur gallop or rub he does have some pedal edema otherwise leg edema appears significantly improved from earlier exams.  Abdomen is soft nontender with positive bowel sounds.  Musculoskeletal continues with lower extremity weakness but moves all extremities at baseline.  Neurologic as noted above could not appreciate lateralizing findings his speech is clear.  Psych he is oriented to self pleasant appropriate follow simple verbal commands without difficulty can give a partial review of systems.   Labs  reviewed: Recent Labs    10/29/17 1112  04/25/18 0839 05/02/18 0800 05/09/18 0130  NA 139   < > 137 141 143  K 4.1   < > 4.4 4.3 3.7  CL 103   < > 108 104 106  CO2 25   < > 24 26 29   GLUCOSE 124*   < > 81 121* 87  BUN 57*   < > 52* 57* 54*  CREATININE 2.20*   < > 1.63* 2.15* 1.82*  CALCIUM 9.1   < > 8.5* 8.8*  8.5*  MG 2.2  --   --   --   --    < > = values in this interval not displayed.   Recent Labs    03/16/18 0836 03/27/18 0828 04/14/18 0832  AST 16 22 20   ALT 4 5 5   ALKPHOS 83 72 81  BILITOT 0.4 0.9 0.5  PROT 5.6* 6.3* 6.2*  ALBUMIN 3.6 3.3* 3.3*   Recent Labs    04/25/18 0839 05/02/18 0800 05/09/18 0130  WBC 11.6* 14.3* 9.8  NEUTROABS 9.5* 12.9* 7.9*  HGB 8.1* 8.2* 7.4*  HCT 25.9* 26.7* 24.6*  MCV 96.6 98.2 98.8  PLT 274 329 316   Lab Results  Component Value Date   TSH 7.565 (H) 04/14/2018   Lab Results  Component Value Date   HGBA1C 5.3 01/07/2015   Lab Results  Component Value Date   CHOL 152 03/16/2018   HDL 81 03/16/2018   LDLCALC 62 03/16/2018   TRIG 45 03/16/2018   CHOLHDL 1.9 03/16/2018    Significant Diagnostic Results in last 30 days:  Dg Chest 2 View  Result Date: 04/14/2018 CLINICAL DATA:  Weakness and syncope. Fell today. History of lung cancer. EXAM: CHEST - 2 VIEW COMPARISON:  Multiple prior chest x-rays from 2019 and 2018. FINDINGS: The cardiac silhouette, mediastinal and hilar contours are within normal limits and stable. Moderate tortuosity and calcification of the thoracic aorta is again demonstrated. Prior bypass surgery. Chronic left apical scarring changes and probable radiation changes. Underlying emphysema. No acute overlying pulmonary findings. Chronic right basilar scarring changes and pleural thickening. The bony thorax is intact. IMPRESSION: Chronic emphysematous changes, pulmonary scarring and left upper lobe scarring and radiation. No acute overlying pulmonary process. Electronically Signed   By: Marijo Sanes M.D.   On:  04/14/2018 08:10   Ct Head Wo Contrast  Result Date: 04/14/2018 CLINICAL DATA:  Tremors and altered mental status EXAM: CT HEAD WITHOUT CONTRAST TECHNIQUE: Contiguous axial images were obtained from the base of the skull through the vertex without intravenous contrast. COMPARISON:  March 27, 2018 FINDINGS: Brain: Mild diffuse atrophy is stable. There is no intracranial mass, hemorrhage, extra-axial fluid collection, or midline shift. There is patchy small vessel disease in the centra semiovale bilaterally. There is an apparent old infarct in the peripheral left temporal-occipital region, stable. No acute infarct is appreciable. Vascular: There is no evident vascular calcification. There is calcification in each distal vertebral artery and carotid siphon region. Skull: Bony calvarium appears intact. Sinuses/Orbits: There is mucosal thickening in multiple ethmoid air cells bilaterally. Other visualized paranasal sinuses are clear. Orbits appear symmetric bilaterally. Other: Mastoid air cells are clear. IMPRESSION: Atrophy with supratentorial small vessel disease. Prior left temporal-occipital infarct. No acute infarct. No mass or hemorrhage. There are foci of arterial vascular calcification. There is mucosal thickening in several ethmoid air cells. Electronically Signed   By: Lowella Grip III M.D.   On: 04/14/2018 10:30   Ct Abdomen Pelvis W Contrast  Result Date: 04/14/2018 CLINICAL DATA:  Abdominal pain, acute, generalized. EXAM: CT ABDOMEN AND PELVIS WITH CONTRAST TECHNIQUE: Multidetector CT imaging of the abdomen and pelvis was performed using the standard protocol following bolus administration of intravenous contrast. CONTRAST:  61mL OMNIPAQUE IOHEXOL 300 MG/ML  SOLN COMPARISON:  10/29/2017 FINDINGS: Lower chest: Moderate sliding hiatal hernia. Extensive coronary calcification. Scarring in the right lower lobe with volume loss and mild bronchiectasis. Mild centrilobular emphysema. Hepatobiliary: No  focal liver abnormality.No evidence of biliary obstruction or stone. Pancreas: Unremarkable.  Spleen: Unremarkable. Adrenals/Urinary Tract: Negative adrenals. Horseshoe kidney. Bilateral hydroureteronephrosis likely from the very distended bladder which is likely patulous at baseline due to the partially folded appearance. Stomach/Bowel: Formed stool throughout much of the colon, most notably the rectum which is distended to 10 cm and fills the pelvis. No evident bowel inflammation. The appendix is difficult to discretely visualize. Vascular/Lymphatic: Extensive atherosclerotic calcification with high-grade bilateral common femoral and superficial femoral stenosis. No mass or adenopathy. Reproductive:Negative Other: No ascites or pneumoperitoneum. Musculoskeletal: Advanced spinal degeneration. No acute osseous finding. IMPRESSION: 1. Constipation with rectal impaction and distention to 10 cm. 2. Dilated bladder, possibly related to#1, with bilateral hydronephrosis of the horseshoe kidneys. 3. Chronic findings are described above. Electronically Signed   By: Monte Fantasia M.D.   On: 04/14/2018 10:30   Dg Op Swallowing Func-medicare/speech Path  Result Date: 05/05/2018 [facility] [address] [place], [place], [address] Phone: [phone]   Fax:  [phone] Modified [place] Swallow Patient Details Name: SUHEYB RAUCCI MRN: [id number] Date of Birth: 14-Jan-1922 No data recorded Encounter Date: 05/05/2018 End of Session - 05/05/18 1532   Visit Number  1   Number of Visits  1   Authorization Type  [employer] Medicare   SLP Start Time  1300   SLP Stop Time   1330   SLP Time Calculation (min)  30 min   Activity Tolerance  Patient tolerated treatment well   Past Medical History: Diagnosis Date . ASCVD (arteriosclerotic cardiovascular disease)    CABG in 04/1993; and negative stress nuclear study in 08/2001 . Benign essential tremor  . CAD (coronary artery disease)  . Cancer ([place])   skin .  Cerebrovascular disease   Right carotid bruit-40-69% left internal carotid artery stenosis in 4/06; followed [facility] . Chronic kidney disease, stage II (mild)   Creatinine-1.6 in 09/2008 . COPD (chronic obstructive pulmonary disease) ([place])  . Degenerative joint disease of knee, left  . Gout  . Hiatal hernia  . Horseshoe kidney  . Hyperlipidemia  . Hypertension  . Lung cancer ([place]) 2013 . Normocytic anemia  . Prediabetes  . Pulmonary disease  . Renal insufficiency  . Syncope  . Tobacco abuse, in remission   40 pack years; discontinued in 1980 Past Surgical History: Procedure Laterality Date . CARDIAC SURGERY   . COLONOSCOPY W/ POLYPECTOMY  1985 . CORONARY ARTERY BYPASS GRAFT  1995 . LESION EXCISION   . PILONIDAL CYST EXCISION  1948 There were no vitals filed for this visit. Subjective Assessment - 05/05/18 1522   Subjective  "Fine."   Currently in Pain?  No/denies   General - 05/05/18 1522    General Information  Date of Onset  05/02/18   HPI  85 y.o.malewith medical history ofcoronary artery disease, COPD, Parkinson's disease, chronic diastolic CHF, horseshoe kidney, hypertension, hyperlipidemia, squamous cell lung cancer, and cognitive impairment presenting with altered mental status. The patient has been having mechanical falls for the last several months. The patient's son brought the patient to the emergency department on 03/27/2018. Work-up at that time revealed that the patient had an odontoid fracture. Neurosurgery was consulted at that time and felt the patient would benefit from nonoperative management. The patient was placed in an Aspen collar and discharged home. Pt was readmitted to [facility] in early January with increased cognitive decline and was discharged to [facility] for rehab. He is referred for MBSS due to suspicion of aspiration PNA.   Type of Study  MBS-Modified [place] Swallow  Study   Diet Prior to this Study  Dysphagia 3 (soft);Thin liquids   Temperature Spikes Noted  No   Respiratory Status  Room air    History of Recent Intubation  No   Behavior/Cognition  Alert;Cooperative;Requires cueing   Oral Cavity Assessment  Within Functional Limits   Oral Care Completed by SLP  No   Oral Cavity - Dentition  Missing dentition   Vision  Functional for self feeding   Self-Feeding Abilities  Able to feed self;Needs assist   Patient Positioning  Upright in chair   Baseline Vocal Quality  Hoarse   Volitional Cough  Strong   Volitional Swallow  Able to elicit   Anatomy  Within functional limits   Pharyngeal Secretions  Not observed secondary MBS  barium appeared to mix with thick secretions  Oral Preparation/Oral Phase - 05/05/18 1528    Oral Preparation/Oral Phase  Oral Phase  Impaired    Oral - Thin  Oral - Thin Cup  Decreased bolus cohesion    Oral - Solids  Oral - Regular  Imparied mastication;Decreased bolus cohesion;Delayed A-P transit;Oral residue    Electrical stimulation - Oral Phase  Was Electrical Stimulation Used  No   Pharyngeal Phase - 05/05/18 1528    Pharyngeal Phase  Pharyngeal Phase  Impaired    Pharyngeal - Nectar  Pharyngeal- Nectar Cup  Swallow initiation at vallecula;Reduced epiglottic inversion;Reduced tongue base retraction;Pharyngeal residue - valleculae   Pharyngeal- Nectar Straw  Swallow initiation at vallecula;Reduced tongue base retraction;Pharyngeal residue - valleculae    Pharyngeal - Thin  Pharyngeal- Thin Teaspoon  Swallow initiation at vallecula;Reduced epiglottic inversion;Reduced tongue base retraction;Pharyngeal residue - valleculae   Pharyngeal- Thin Cup  Swallow initiation at vallecula;Swallow initiation at pyriform sinus;Reduced epiglottic inversion;Reduced tongue base retraction;Reduced airway/laryngeal closure;Penetration/Aspiration before swallow;Penetration/Aspiration during swallow;Trace aspiration;Pharyngeal residue - valleculae   Pharyngeal  Material does not enter airway;Material enters airway, remains ABOVE vocal cords then ejected out;Material enters airway, passes BELOW cords  then ejected out;Material enters airway, CONTACTS cords and not ejected out    Pharyngeal - Solids  Pharyngeal- Puree  Within functional limits   Pharyngeal- Regular  Reduced epiglottic inversion;Reduced tongue base retraction;Pharyngeal residue - valleculae   Pharyngeal- Pill  Within functional limits  in puree, transiently delayed in valleculae   Electrical Stimulation - Pharyngeal Phase  Was Electrical Stimulation Used  No   Cricopharyngeal Phase - 05/05/18 1531    Cervical Esophageal Phase  Cervical Esophageal Phase  Impaired    Cervical Esophageal Phase - Thin  Thin Cup  Prominent cricopharyngeal segment   Plan - 05/05/18 1533   Clinical Impression Statement  Pt presents with moderate oropharyngeal phase dysphagia characterized by reduced bolus cohesiveness with liquids, impaired mastication, premature spillage, decreased tongue base retraction and epiglottic deflection resulting in variable min penetration x2 to the vocal folds with cup sips thin from vallecular residuals spilling to pyriforms and around arytenoids. Pt had one episode where contrast extended below cords and was cleared with a cued cough. Pt achieved improved epiglottic deflection with puree and nectar-thickened liquids. Pt with mild/mod vallecular residue with regular solids which cleared with repeat/dry swallow. Pt reportedly with suspected aspiration PNA, however no gross aspiration observed during today's assessment. Pt is likely to experience trace aspiration from residuals after the swallow and/or from secretions. Pt wears cervical collar throughout the day at this time. Consider placing Pt on nectar-thick liquids given the above with dysphagia therapy to focus on effortful swallow, tongue base strengthening, and implementation of  strategies (complete dry swallows and clear throat periodically) until Pt is stronger overall. Swallow function was WNL during puree swallows. Recommend D3/mech soft and nectar-thick liquids; consider repeat  objective study in 4-6 weeks or as clinically appropriate.  Patient will benefit from skilled therapeutic intervention in order to improve the following deficits and impairments:  Dysphagia, oropharyngeal phase Recommendations/Treatment - 05/05/18 1531    Swallow Evaluation Recommendations  SLP Diet Recommendations  Dysphagia 3 (mechanical soft);Nectar   Thickener user  Simply thick   Liquid Administration via  Cup   Medication Administration  Whole meds with puree   Supervision  Patient able to self feed;Full supervision/cueing for compensatory strategies   Compensations  Slow rate;Multiple dry swallows after each bite/sip;Clear throat intermittently;Effortful swallow   Postural Changes  Seated upright at 90 degrees;Remain upright for at least 30 minutes after feeds/meals   Problem List Patient Active Problem List  Diagnosis Date Noted . Recurrent falls 04/25/2018 . Protein-calorie malnutrition, severe 04/18/2018 . Hydronephrosis  . Syncope and collapse  . Acute metabolic encephalopathy 51/76/1607 . Failure to thrive in adult 04/14/2018 . Acute urinary retention 04/14/2018 . Fecal impaction ([place]) 04/14/2018 . Goals of care, counseling/discussion  . Palliative care by specialist  . DNR (do not resuscitate) discussion  . Gout 01/14/2018 . Lobar pneumonia, unspecified organism ([place]) 10/29/2017 . Pleural effusion, right  . Pedal edema 05/14/2017 . Parkinson's disease ([place]) 10/05/2016 . Diastolic CHF, chronic ([place]) 05/10/2015 . Dizziness  . Coronary artery disease involving coronary bypass graft of native heart without angina pectoris  . Near syncope 04/02/2015 . Diarrhea 04/02/2015 . 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 COPD exacerbation ([place]) 06/04/2013 . Shortness of breath 06/04/2013 . Acute respiratory failure with hypoxia ([place]) 06/04/2013 . Hemoptysis 08/10/2012 . Malignant neoplasm of upper lobe, left bronchus or lung ([place]) 01/06/2012 . Prediabetes 08/28/2011 . Lung cancer ([place]) 08/25/2011 .  COPD (chronic obstructive pulmonary disease) ([place]) 08/25/2011 . CKD (chronic kidney disease), stage III ([place]) 12/07/2010 . Chest pain 08/18/2010 . Abdominal pain 08/18/2010 . Syncope  . Tobacco abuse, in remission  . Degenerative joint disease of knee, left  . COLONIC POLYPS 05/20/2009 . Hyperlipidemia 05/20/2009 . ANEMIA 05/20/2009 . Essential tremor 05/20/2009 . ATHEROSCLEROTIC CARDIOVASCULAR DISEASE 05/20/2009 . CEREBROVASCULAR DISEASE 05/20/2009 . Essential hypertension 01/14/2009 Thank you, Genene Churn, [place] [facility] 05/05/2018, 3:39 PM [place], [place], [address] Phone: [phone]   Fax:  [phone] Name: DARRION WYSZYNSKI MRN: [id number] Date of Birth: 1921-09-08 CLINICAL DATA:  Dysphagia, history of fall with C2 fracture EXAM: MODIFIED BARIUM SWALLOW TECHNIQUE: Different consistencies of barium were administered orally to the patient by the Speech Pathologist. Imaging of the pharynx was performed in the lateral projection. FLUOROSCOPY TIME:  Fluoroscopy Time:  5 minutes 30 seconds Radiation Exposure Index (if provided by the fluoroscopic device): 33.7 mGy Number of Acquired Spot Images: Multiple fluoroscopic series COMPARISON:  None FINDINGS: Thin liquid-with teaspoon, normal motion is seen without laryngeal penetration or aspiration. Vallecular residuals and minimal piriform sinus residuals are noted. With sips of thin barium by cup, laryngeal penetration was identified, predominantly from contrast at the arytenoids and piriform sinuses, with contrast extending onto the vocal cords. No aspiration. Less laryngeal penetration was seen with bolus holding prior to swallow. Vallecular residuals persisted. Nectar thick liquid-vallecular residuals. No laryngeal penetration or aspiration. Honey-not evaluated Pure- within normal limits Cracker-within normal limits Pure with cracker-not evaluated Barium tablet-swallow with applesauce  without difficulty. Incidentally noted is hypomotility  of the thoracic esophagus. IMPRESSION: Laryngeal penetration of thin liquid contrast to the level of the vocal cords for, primarily from contrast at the RIGHT nodes and the piriform sinuses. No aspiration of contrast into the trachea. Remainder of exam unremarkable. Please refer to the Speech Pathologists report for complete details and recommendations. Electronically Signed   By: Lavonia Dana M.D.   On: 05/05/2018 13:34   US Renal  Result Date: 04/15/2018 CLINICAL DATA:  Hydronephrosis. EXAM: RENAL / URINARY TRACT ULTRASOUND COMPLETE COMPARISON:  CT abdomen pelvis from yesterday. FINDINGS: Right Kidney: Renal measurements: 10.4 x 4.4 x 4.6 cm = volume: 108 mL . Echogenicity within normal limits. Mild right hydronephrosis is unchanged. No mass visualized. Left Kidney: Renal measurements: 10.6 x 4.2 x 3.7 cm = volume: 86 mL. Echogenicity within normal limits. No mass or hydronephrosis visualized. Bladder: Decompressed by Foley catheter. IMPRESSION: 1. Unchanged mild right hydronephrosis. Resolved left hydronephrosis. Electronically Signed   By: Titus Dubin M.D.   On: 04/15/2018 12:22    Assessment/Plan  #1 history of bilateral lower lobe infiltrates-again update x-ray did not show this it did show a baseline left effusion and CHF changes.  At this point he has finished his course of doxycycline essentially- at this point will monitor.  2.  Anemia with an element of iron deficiency we have guaiac his stools so far these have been negative-I suspect there is an element of chronic disease with his renal issues- hemoglobin had gone down slightly at 7.4 earlier this week will restart his iron update CBC is pending    B12 and folate levels previously have been adequate  #3- history of diastolic CHF-his edema has improved significantly weight has trended down- we are currently holding his Demadex-at this point will monitor he does have some pedal  edema this will have to be watched-chest x-ray showed CHF which is chronic If Demadex is restarted we will try to do at a somewhat lower dose secondary to hypotension concerns and risk of increased renal insufficiency  4.  History of chronic kidney disease creatinine 1.82 on lab done appears to be relative baseline recently-.  [id number]

## 2018-05-13 ENCOUNTER — Encounter (HOSPITAL_COMMUNITY)
Admission: RE | Admit: 2018-05-13 | Discharge: 2018-05-13 | Disposition: A | Discharge status: Home / Self Care | Payer: Medicare Other | Source: Skilled Nursing Facility | Attending: Internal Medicine | Admitting: Internal Medicine

## 2018-05-13 DIAGNOSIS — H109 Unspecified conjunctivitis: Secondary | ICD-10-CM | POA: Insufficient documentation

## 2018-05-13 DIAGNOSIS — G9341 Metabolic encephalopathy: Secondary | ICD-10-CM | POA: Insufficient documentation

## 2018-05-13 DIAGNOSIS — I130 Hypertensive heart and chronic kidney disease with heart failure and stage 1 through stage 4 chronic kidney disease, or unspecified chronic kidney disease: Secondary | ICD-10-CM | POA: Insufficient documentation

## 2018-05-13 DIAGNOSIS — N183 Chronic kidney disease, stage 3 (moderate): Secondary | ICD-10-CM | POA: Insufficient documentation

## 2018-05-13 LAB — CBC WITH DIFFERENTIAL/PLATELET
Abs Immature Granulocytes: 0.03 10*3/uL (ref 0.00–0.07)
Basophils Absolute: 0 10*3/uL (ref 0.0–0.1)
Basophils Relative: 0 %
EOS ABS: 0.1 10*3/uL (ref 0.0–0.5)
Eosinophils Relative: 2 %
HCT: 25.7 % — ABNORMAL LOW (ref 39.0–52.0)
Hemoglobin: 7.6 g/dL — ABNORMAL LOW (ref 13.0–17.0)
Immature Granulocytes: 0 %
Lymphocytes Relative: 15 %
Lymphs Abs: 1.3 10*3/uL (ref 0.7–4.0)
MCH: 28.9 pg (ref 26.0–34.0)
MCHC: 29.6 g/dL — ABNORMAL LOW (ref 30.0–36.0)
MCV: 97.7 fL (ref 80.0–100.0)
Monocytes Absolute: 0.9 10*3/uL (ref 0.1–1.0)
Monocytes Relative: 10 %
NEUTROS PCT: 73 %
Neutro Abs: 6.1 10*3/uL (ref 1.7–7.7)
PLATELETS: 365 10*3/uL (ref 150–400)
RBC: 2.63 MIL/uL — AB (ref 4.22–5.81)
RDW: 16.8 % — AB (ref 11.5–15.5)
WBC: 8.5 10*3/uL (ref 4.0–10.5)
nRBC: 0 % (ref 0.0–0.2)

## 2018-05-13 LAB — BASIC METABOLIC PANEL
Anion gap: 6 (ref 5–15)
BUN: 54 mg/dL — ABNORMAL HIGH (ref 8–23)
CO2: 30 mmol/L (ref 22–32)
Calcium: 8.5 mg/dL — ABNORMAL LOW (ref 8.9–10.3)
Chloride: 109 mmol/L (ref 98–111)
Creatinine, Ser: 1.64 mg/dL — ABNORMAL HIGH (ref 0.61–1.24)
GFR calc Af Amer: 41 mL/min — ABNORMAL LOW (ref >60)
GFR calc non Af Amer: 35 mL/min — ABNORMAL LOW (ref >60)
Glucose, Bld: 90 mg/dL (ref 70–99)
Potassium: 4 mmol/L (ref 3.5–5.1)
Sodium: 145 mmol/L (ref 135–145)

## 2018-05-14 ENCOUNTER — Encounter (HOSPITAL_COMMUNITY)
Admission: AD | Admit: 2018-05-14 | Discharge: 2018-05-14 | Disposition: A | Discharge status: Home / Self Care | Payer: Medicare Other | Source: Skilled Nursing Facility | Attending: Internal Medicine | Admitting: Internal Medicine

## 2018-05-14 DIAGNOSIS — R1900 Intra-abdominal and pelvic swelling, mass and lump, unspecified site: Secondary | ICD-10-CM | POA: Insufficient documentation

## 2018-05-14 LAB — OCCULT BLOOD X 1 CARD TO LAB, STOOL: Fecal Occult Bld: NEGATIVE

## 2018-05-16 ENCOUNTER — Encounter: Payer: Self-pay | Admitting: Internal Medicine

## 2018-05-16 ENCOUNTER — Non-Acute Institutional Stay (SKILLED_NURSING_FACILITY): Payer: Medicare Other | Admitting: Internal Medicine

## 2018-05-16 DIAGNOSIS — R296 Repeated falls: Secondary | ICD-10-CM

## 2018-05-16 DIAGNOSIS — I5032 Chronic diastolic (congestive) heart failure: Secondary | ICD-10-CM | POA: Diagnosis not present

## 2018-05-16 DIAGNOSIS — D649 Anemia, unspecified: Secondary | ICD-10-CM

## 2018-05-16 DIAGNOSIS — R338 Other retention of urine: Secondary | ICD-10-CM

## 2018-05-16 DIAGNOSIS — G20 Parkinson's disease: Secondary | ICD-10-CM | POA: Diagnosis not present

## 2018-05-16 DIAGNOSIS — R600 Localized edema: Secondary | ICD-10-CM

## 2018-05-16 NOTE — Progress Notes (Signed)
Location:    [place] Room Number: 125/P Place of Service:  SNF [phone]) Provider: Veleta Miners MD  Kathyrn Drown, MD  Patient Care Team: Kathyrn Drown, MD as PCP - General [facility] Medicine)  Extended Emergency Contact Information Primary Emergency Contact: [facility] Address: [address]          [place], [place] [address] [place] Litter of [place] Phone: [phone] Mobile Phone: [phone] Relation: Son Secondary Emergency Contact: [facility] Address: [address]          [place], [place] [address] [place] Phone: [phone] Relation: Spouse  Code Status:  DNR Goals of care: Advanced Directive information Advanced Directives 05/16/2018  Does Patient Have a Medical Advance Directive? Yes  Type of Advance Directive Out of facility DNR (pink MOST or yellow form)  Does patient want to make changes to medical advance directive? No - Patient declined  Copy of [place] in Chart? -  Would patient like information on creating a medical advance directive? No - Patient declined  Pre-existing out of facility DNR order (yellow form or pink MOST form) -     Chief Complaint  Patient presents with  . Acute Visit    Lower extremity edema    HPI:  Pt is a 85 y.o. male seen today for an acute visit evaluating for restarting Demadex for lower extremity edema  Patient Came to SNF for therapy after staying in the  hospital from 1/02- 01/07 for recurrent  falls and metabolic encephalopathy. Patient has a history of CAD status post CABG, COPD, Parkinson disease, chronic diastolic CHF, hypertension, hyperlipidemia, squamous cell lung cancer status post resection and cognitive impairment.  He and his wife live with his son. He was having recurrent falls at home and sustained odontoid fracture in 1 of those falls.  His detailed work-up in the hospital including CT scan and echo did not show any acute processes.  He was  sent for therapy to SNF. Patient also failed voiding trial and has Foley catheter. In the facility Patient developed Bilateral Infiltrate and was treated with Doxycyline. He also had Swallowing study and now is on Honey thick Diet. He Also has worsening of his CHF and his Demadex was restarted. But then stopped due to worsening of Renal function. Per his son patient now has swelling in his legs and is wondering if his Demadex can be restarted. Patient also has a stable pressure wound in the left heel.  And a small sacral wound. He also had problems with anemia and is on iron.  He has been occult negative. Patient had some cough but denies any shortness of breath. Patient is not doing very well with therapy.  He is able to transfer but needed assist with walking and is mostly wheelchair dependent at this time    Past Medical History:  Diagnosis Date  . ASCVD (arteriosclerotic cardiovascular disease)     CABG in 04/1993; and negative stress nuclear study in 08/2001  . Benign essential tremor   . CAD (coronary artery disease)   . Cancer ([place])    skin  . Cerebrovascular disease    Right carotid bruit-40-69% left internal carotid artery stenosis in 4/06; followed [facility]  . Chronic kidney disease, stage II (mild)    Creatinine-1.6 in 09/2008  . COPD (chronic obstructive pulmonary disease) ([place])   . Degenerative joint disease of knee, left   . Gout   . Hiatal hernia   .  Horseshoe kidney   . Hyperlipidemia   . Hypertension   . Lung cancer ([place]) 2013  . Normocytic anemia   . Prediabetes   . Pulmonary disease   . Renal insufficiency   . Syncope   . Tobacco abuse, in remission    40 pack years; discontinued in 1980   Past Surgical History:  Procedure Laterality Date  . CARDIAC SURGERY    . COLONOSCOPY W/ POLYPECTOMY  1985  . CORONARY ARTERY BYPASS GRAFT  1995  . LESION EXCISION    . PILONIDAL CYST EXCISION  1948    Allergies  Allergen Reactions  . Propranolol Nausea Only  .  Levaquin [Levofloxacin In D5w] Hives and Nausea And Vomiting  . Penicillins Hives and Swelling    Patient has tolerated cephalosporins several times  . Sulfonamide Derivatives Hives and Swelling  . Sympathomimetics Other (See Comments)  . Tape Other (See Comments)    SKIN IS VERY THIN AND TEARS EASILY; PLEASE USE AN ALTERNATIVE TO TAPE!!    Outpatient Encounter Medications as of 05/16/2018  Medication Sig  . albuterol (PROVENTIL HFA;VENTOLIN HFA) 108 (90 Base) MCG/ACT inhaler Inhale 2 puffs into the lungs every 6 (six) hours as needed for wheezing or shortness of breath.  Marland Kitchen albuterol (PROVENTIL) (2.5 MG/3ML) 0.083% nebulizer solution Inhale 3 mLs into the lungs every 4 (four) hours as needed for wheezing or shortness of breath.  . Artificial Tear Solution (SOOTHE XP) SOLN Place 2 drops into both eyes 2 (two) times daily as needed (for dry eyes).   Marland Kitchen aspirin EC 81 MG tablet Take 81 mg by mouth daily.  Marland Kitchen atorvastatin (LIPITOR) 40 MG tablet TAKE 1 TABLET (40 MG TOTAL) BY MOUTH DAILY.  . budesonide-formoterol (SYMBICORT) 160-4.5 MCG/ACT inhaler INHALE 2 PUFF INTO THE LUNGS 2 TIMES DAILY.  . carbidopa-levodopa (SINEMET IR) 25-100 MG tablet Take three tablets by mouth three times daily  . Ferrous Sulfate (IRON) 325 (65 Fe) MG TABS Take 1 tablet by mouth twice a day  . guaiFENesin (MUCINEX) 600 MG 12 hr tablet Take 1 tablet (600 mg total) by mouth 2 (two) times daily.  . metoprolol tartrate (LOPRESSOR) 25 MG tablet TAKE 1 TABLET BY MOUTH TWICE A DAY  . Multiple Vitamins-Minerals (CENTRUM SILVER 50+MEN) TABS Take 1 tablet by mouth daily.  . nitroGLYCERIN (NITROSTAT) 0.4 MG SL tablet Place 1 tablet (0.4 mg total) under the tongue every 5 (five) minutes as needed for chest pain. May take up to 3 doses per episode.  . pantoprazole (PROTONIX) 40 MG tablet TAKE 1 TABLET BY MOUTH DAILY.  Marland Kitchen sennosides-docusate sodium (SENOKOT-S) 8.6-50 MG tablet Take 1 tablet by mouth 2 (two) times daily.  . tamsulosin  (FLOMAX) 0.4 MG CAPS capsule TAKE ONE CAPSULE BY MOUTH AT BEDTIME.  Marland Kitchen tiotropium (SPIRIVA) 18 MCG inhalation capsule Place 1 capsule (18 mcg total) into inhaler and inhale daily.  . [DISCONTINUED] albuterol (PROVENTIL) (2.5 MG/3ML) 0.083% nebulizer solution Take 2.5 mg by nebulization every 6 (six) hours. For 48 hours from 05/01/2018-05/03/2018  . [DISCONTINUED] doxycycline (VIBRAMYCIN) 100 MG capsule Take 100 mg by mouth 2 (two) times daily. For 7 days from 1/20/220-/27/2020  . [DISCONTINUED] Probiotic Product (RISA-BID PROBIOTIC) TABS Take 1 tablet by mouth twice a day  . [DISCONTINUED] senna (SENOKOT) 8.6 MG TABS tablet Take 2 tablets (17.2 mg total) by mouth at bedtime. (Patient taking differently: Take 1 tablet by mouth 2 (two) times daily. )   No facility-administered encounter medications on file as of 05/16/2018.  Review of Systems  Constitutional: Negative.   HENT: Negative.   Respiratory: Positive for cough and shortness of breath.   Cardiovascular: Positive for leg swelling.  Gastrointestinal: Positive for constipation.  Genitourinary: Positive for difficulty urinating.  Musculoskeletal: Negative.   Skin: Negative.   Neurological: Positive for weakness.  Psychiatric/Behavioral: Negative.     Immunization History  Administered Date(s) Administered  . Influenza Split 01/10/2013  . Influenza,inj,Quad PF,6+ Mos 01/10/2014, 01/07/2015, 01/09/2016, 02/09/2017, 12/14/2017  . Influenza-Unspecified 01/12/2012  . Pneumococcal Conjugate-13 10/27/2013  . Pneumococcal Polysaccharide-23 01/11/2001, 07/13/2007  . Td 11/26/2004, 05/23/2015  . Tdap 11/22/2014  . Zoster 02/28/2008  . Zoster Recombinat (Shingrix) 03/18/2018   Pertinent  Health Maintenance Due  Topic Date Due  . INFLUENZA VACCINE  Completed  . PNA vac Low Risk Adult  Completed   Fall Risk  09/06/2015 01/07/2015 04/20/2014 09/19/2013  Falls in the past year? No No Yes No  Number falls in past yr: - - 2 or more -  Risk  Factor Category  - - High Fall Risk -  Risk for fall due to : - - History of fall(s);Impaired mobility -   Functional Status Survey:    Vitals:   05/16/18 1507  BP: 117/64  Pulse: 76  Resp: 20  Temp: (!) 97.4 F (36.3 C)  TempSrc: Oral   There is no height or weight on file to calculate BMI. Physical Exam Constitutional:      Appearance: Normal appearance.  HENT:     Head: Normocephalic.     Nose: Nose normal.     Mouth/Throat:     Mouth: Mucous membranes are moist.     Pharynx: Oropharynx is clear.  Eyes:     Pupils: Pupils are equal, round, and reactive to light.  Neck:     Musculoskeletal: Neck supple.  Cardiovascular:     Rate and Rhythm: Normal rate and regular rhythm.     Pulses: Normal pulses.     Heart sounds: Normal heart sounds.  Pulmonary:     Effort: Pulmonary effort is normal. No respiratory distress.     Breath sounds: Normal breath sounds. No wheezing or rales.  Abdominal:     General: Abdomen is flat. Bowel sounds are normal. There is no distension.     Palpations: Abdomen is soft.     Tenderness: There is no abdominal tenderness. There is no guarding.  Musculoskeletal:     Comments: Has Mild edema Bilateral  Skin:    General: Skin is warm.  Neurological:     General: No focal deficit present.     Mental Status: He is alert.     Comments: Was oriented to time and knew his Age.   Psychiatric:        Mood and Affect: Mood normal.        Behavior: Behavior normal.        Thought Content: Thought content normal.     Labs reviewed: Recent Labs    10/29/17 1112  05/02/18 0800 05/09/18 0130 05/13/18 0729  NA 139   < > 141 143 145  K 4.1   < > 4.3 3.7 4.0  CL 103   < > 104 106 109  CO2 25   < > 26 29 30   GLUCOSE 124*   < > 121* 87 90  BUN 57*   < > 57* 54* 54*  CREATININE 2.20*   < > 2.15* 1.82* 1.64*  CALCIUM 9.1   < > 8.8* 8.5* 8.5*  MG 2.2  --   --   --   --    < > = values in this interval not displayed.   Recent Labs     03/16/18 0836 03/27/18 0828 04/14/18 0832  AST 16 22 20   ALT 4 5 5   ALKPHOS 83 72 81  BILITOT 0.4 0.9 0.5  PROT 5.6* 6.3* 6.2*  ALBUMIN 3.6 3.3* 3.3*   Recent Labs    05/02/18 0800 05/09/18 0130 05/13/18 0729  WBC 14.3* 9.8 8.5  NEUTROABS 12.9* 7.9* 6.1  HGB 8.2* 7.4* 7.6*  HCT 26.7* 24.6* 25.7*  MCV 98.2 98.8 97.7  PLT 329 316 365   Lab Results  Component Value Date   TSH 7.565 (H) 04/14/2018   Lab Results  Component Value Date   HGBA1C 5.3 01/07/2015   Lab Results  Component Value Date   CHOL 152 03/16/2018   HDL 81 03/16/2018   LDLCALC 62 03/16/2018   TRIG 45 03/16/2018   CHOLHDL 1.9 03/16/2018    Significant Diagnostic Results in last 30 days:  Dg Op Swallowing Func-medicare/speech Path  Result Date: 05/05/2018 [place], [place], [address] Phone: [phone]   Fax:  [phone] Modified Barium Swallow Patient Details Name: [name] MRN: [id number] Date of Birth: [birth year] No data recorded Encounter Date: 05/05/2018 End of Session - 05/05/18 1532   Visit Number  1   Number of Visits  1   Authorization Type  [employer] Medicare   SLP Start Time  1300   SLP Stop Time   1330   SLP Time Calculation (min)  30 min   Activity Tolerance  Patient tolerated treatment well   Past Medical History: Diagnosis Date . ASCVD (arteriosclerotic cardiovascular disease)    CABG in 04/1993; and negative stress nuclear study in 08/2001 . Benign essential tremor  . CAD (coronary artery disease)  . Cancer ([place])   skin . Cerebrovascular disease   Right carotid bruit-40-69% left internal carotid artery stenosis in 4/06; followed [facility] . Chronic kidney disease, stage II (mild)   Creatinine-1.6 in 09/2008 . COPD (chronic obstructive pulmonary disease) ([place])  . Degenerative joint disease of knee, left  . Gout  . Hiatal hernia  . Horseshoe kidney  . Hyperlipidemia  . Hypertension  . Lung cancer ([place]) 2013 . Normocytic anemia  . Prediabetes  .  Pulmonary disease  . Renal insufficiency  . Syncope  . Tobacco abuse, in remission   40 pack years; discontinued in 1980 Past Surgical History: Procedure Laterality Date . CARDIAC SURGERY   . COLONOSCOPY W/ POLYPECTOMY  1985 . CORONARY ARTERY BYPASS GRAFT  1995 . LESION EXCISION   . PILONIDAL CYST EXCISION  1948 There were no vitals filed for this visit. Subjective Assessment - 05/05/18 1522   Subjective  "Fine."   Currently in Pain?  No/denies   General - 05/05/18 1522    General Information  Date of Onset  05/02/18   HPI  85 y.o.malewith medical history ofcoronary artery disease, COPD, Parkinson's disease, chronic diastolic CHF, horseshoe kidney, hypertension, hyperlipidemia, squamous cell lung cancer, and cognitive impairment presenting with altered mental status. The patient has been having mechanical falls for the last several months. The patient's son brought the patient to the emergency department on 03/27/2018. Work-up at that time revealed that the patient had an odontoid fracture. Neurosurgery was consulted at that time and felt the patient would benefit from nonoperative management. The patient was placed in  an [employer] and discharged home. Pt was readmitted to [facility] in early January with increased cognitive decline and was [facility] to [facility] for [facility]hab. He is referred for MBSS due to suspicion of aspiration PNA.   Type of Study  MBS-Modified Barium Swallow Study   Diet Prior to this Study  Dysphagia 3 (soft);Thin liquids   Temperature Spikes Noted  No   Respiratory Status  Room air   History of Recent Intubation  No   Behavior/Cognition  Alert;Cooperative;Requires cueing   Oral Cavity Assessment  Within Functional Limits   Oral Care Completed by SLP  No   Oral Cavity - Dentition  Missing dentition   Vision  Functional for self feeding   Self-Feeding Abilities  Able to feed self;Needs assist   Patient Positioning  Upright in chair   Baseline Vocal Quality  Hoarse   Volitional Cough  Strong    Volitional Swallow  Able to elicit   Anatomy  Within functional limits   Pharyngeal Secretions  Not observed secondary MBS  barium appeared to mix with thick secretions  Oral Preparation/Oral Phase - 05/05/18 1528    Oral Preparation/Oral Phase  Oral Phase  Impaired    Oral - Thin  Oral - Thin Cup  Decreased bolus cohesion    Oral - Solids  Oral - Regular  Imparied mastication;Decreased bolus cohesion;Delayed A-P transit;Oral residue    Electrical stimulation - Oral Phase  Was Electrical Stimulation Used  No   Pharyngeal Phase - 05/05/18 1528    Pharyngeal Phase  Pharyngeal Phase  Impaired    Pharyngeal - Nectar  Pharyngeal- Nectar Cup  Swallow initiation at vallecula;Reduced epiglottic inversion;Reduced tongue base retraction;Pharyngeal residue - valleculae   Pharyngeal- Nectar Straw  Swallow initiation at vallecula;Reduced tongue base retraction;Pharyngeal residue - valleculae    Pharyngeal - Thin  Pharyngeal- Thin Teaspoon  Swallow initiation at vallecula;Reduced epiglottic inversion;Reduced tongue base retraction;Pharyngeal residue - valleculae   Pharyngeal- Thin Cup  Swallow initiation at vallecula;Swallow initiation at pyriform sinus;Reduced epiglottic inversion;Reduced tongue base retraction;Reduced airway/laryngeal closure;Penetration/Aspiration before swallow;Penetration/Aspiration during swallow;Trace aspiration;Pharyngeal residue - valleculae   Pharyngeal  Material does not enter airway;Material enters airway, remains ABOVE vocal cords then ejected out;Material enters airway, passes BELOW cords then ejected out;Material enters airway, CONTACTS cords and not ejected out    Pharyngeal - Solids  Pharyngeal- Puree  Within functional limits   Pharyngeal- Regular  Reduced epiglottic inversion;Reduced tongue base retraction;Pharyngeal residue - valleculae   Pharyngeal- Pill  Within functional limits  in puree, transiently delayed in valleculae   Electrical Stimulation - Pharyngeal Phase  Was Electrical  Stimulation Used  No   Cricopharyngeal Phase - 05/05/18 1531    Cervical Esophageal Phase  Cervical Esophageal Phase  Impaired    Cervical Esophageal Phase - Thin  Thin Cup  Prominent cricopharyngeal segment   Plan - 05/05/18 1533   Clinical Impression Statement  Pt presents with moderate oropharyngeal phase dysphagia characterized by reduced bolus cohesiveness with liquids, impaired mastication, premature spillage, decreased tongue base retraction and epiglottic deflection resulting in variable min penetration x2 to the vocal folds with cup sips thin from vallecular residuals spilling to pyriforms and around arytenoids. Pt had one episode where contrast extended below cords and was cleared with a cued cough. Pt achieved improved epiglottic deflection with puree and nectar-thickened liquids. Pt with mild/mod vallecular residue with regular solids which cleared with repeat/dry swallow. Pt reportedly with suspected aspiration PNA, however no gross aspiration observed during today's assessment. Pt is likely  to experience trace aspiration from residuals after the swallow and/or from secretions. Pt wears cervical collar throughout the day at this time. Consider placing Pt on nectar-thick liquids given the above with dysphagia therapy to focus on effortful swallow, tongue base strengthening, and implementation of strategies (complete dry swallows and clear throat periodically) until Pt is stronger overall. Swallow function was WNL during puree swallows. Recommend D3/mech soft and nectar-thick liquids; consider repeat objective study in 4-6 weeks or as clinically appropriate.  Patient will benefit from skilled therapeutic intervention in order to improve the following deficits and impairments:  Dysphagia, oropharyngeal phase Recommendations/Treatment - 05/05/18 1531    Swallow Evaluation Recommendations  SLP Diet Recommendations  Dysphagia 3 (mechanical soft);Nectar   Thickener user  Simply thick   Liquid Administration  via  Cup   Medication Administration  Whole meds with puree   Supervision  Patient able to self feed;Full supervision/cueing for compensatory strategies   Compensations  Slow rate;Multiple dry swallows after each bite/sip;Clear throat intermittently;Effortful swallow   Postural Changes  Seated upright at 90 degrees;Remain upright for at least 30 minutes after feeds/meals   Problem List Patient Active Problem List  Diagnosis Date Noted . Recurrent falls 04/25/2018 . Protein-calorie malnutrition, severe 04/18/2018 . Hydronephrosis  . Syncope and collapse  . Acute metabolic encephalopathy 36/62/9476 . Failure to thrive in adult 04/14/2018 . Acute urinary retention 04/14/2018 . Fecal impaction ([place]) 04/14/2018 . Goals of care, counseling/discussion  . Palliative care by specialist  . DNR (do not resuscitate) discussion  . Gout 01/14/2018 . Lobar pneumonia, unspecified organism ([place]) 10/29/2017 . Pleural effusion, right  . Pedal edema 05/14/2017 . Parkinson's disease ([place]) 10/05/2016 . Diastolic CHF, chronic ([place]) 05/10/2015 . Dizziness  . Coronary artery disease involving coronary bypass graft of native heart without angina pectoris  . Near syncope 04/02/2015 . Diarrhea 04/02/2015 . 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 COPD exacerbation ([place]) 06/04/2013 . Shortness of breath 06/04/2013 . Acute respiratory failure with hypoxia ([place]) 06/04/2013 . Hemoptysis 08/10/2012 . Malignant neoplasm of upper lobe, left bronchus or lung ([place]) 01/06/2012 . Prediabetes 08/28/2011 . Lung cancer ([place]) 08/25/2011 . COPD (chronic obstructive pulmonary disease) ([place]) 08/25/2011 . CKD (chronic kidney disease), stage III ([place]) 12/07/2010 . Chest pain 08/18/2010 . Abdominal pain 08/18/2010 . Syncope  . Tobacco abuse, in remission  . Degenerative joint disease of knee, left  . COLONIC POLYPS 05/20/2009 . Hyperlipidemia 05/20/2009 . ANEMIA 05/20/2009 . Essential tremor 05/20/2009 . ATHEROSCLEROTIC CARDIOVASCULAR DISEASE  05/20/2009 . CEREBROVASCULAR DISEASE 05/20/2009 . Essential hypertension 01/14/2009 Thank you, Genene Churn, [facility] 05/05/2018, 3:39 PM [place], [place], [address] Phone: [phone]   Fax:  [phone] Name: [name] MRN: [id number] Date of Birth: 30-Jan-1922 CLINICAL DATA:  Dysphagia, history of fall with C2 fracture EXAM: MODIFIED BARIUM SWALLOW TECHNIQUE: Different consistencies of barium were administered orally to the patient by the Speech Pathologist. Imaging of the pharynx was performed in the lateral projection. FLUOROSCOPY TIME:  Fluoroscopy Time:  5 minutes 30 seconds Radiation Exposure Index (if provided by the fluoroscopic device): 33.7 mGy Number of Acquired Spot Images: Multiple fluoroscopic series COMPARISON:  None FINDINGS: Thin liquid-with teaspoon, normal motion is seen without laryngeal penetration or aspiration. Vallecular residuals and minimal piriform sinus residuals are noted. With sips of thin barium by cup, laryngeal penetration was identified, predominantly from contrast at the arytenoids and piriform sinuses, with contrast extending onto the vocal cords. No aspiration. Less laryngeal  penetration was seen with bolus holding prior to swallow. Vallecular residuals persisted. Nectar thick liquid-vallecular residuals. No laryngeal penetration or aspiration. Honey-not evaluated Pure- within normal limits Cracker-within normal limits Pure with cracker-not evaluated Barium tablet-swallow with applesauce without difficulty. Incidentally noted is hypomotility of the thoracic esophagus. IMPRESSION: Laryngeal penetration of thin liquid contrast to the level of the vocal cords for, primarily from contrast at the RIGHT nodes and the piriform sinuses. No aspiration of contrast into the trachea. Remainder of exam unremarkable. Please refer to the Speech Pathologists report for complete details and  recommendations. Electronically Signed   By: Lavonia Dana M.D.   On: 05/05/2018 13:34    Assessment/Plan Diastolic CHF With his Cough and SOB will restart his Demadex 10 mg increase as needed to 20  Continue to follow weights though his weight is less then before.  Recurrent falls most  like multifactorial Due to his Parkinson, failure to thrive and cognitive impairment Continues to be wheelchair dependent.  Is mild assist for transfers Son not sure if he can take care of him at home  S/p odontoid fracture Follow up with neurosurgery in 4 weeks. Last visit the fracture had not healed and patient is suppose to Continue the brace  Parkinson's disease  Symptoms control on Sinemet  Acute urinary retention  on Flomax Failed Voiding trial in Facility Has follow up with Urology in few weeks. He did have hydronephrosis due to obstruction on his ultrasound  CKD (chronic kidney disease), stage 4 Some worsening of BUN since he has been on Demadex  We will repeat his BMP  Anemia,  Due to iron Def and CKD Was positive stool once but has been negative recently On iron BID  CAD On his aspirin, beta-blocker and atorvastatin COPD Patient stable on Symbicort and Spiriva Constipation with impaction On Senna. Will Try MOM for few days Possible start on [place] / Discharge Planning Discussed in detail with his son.  I do not think patient is going to be able to walk with a walker.  And is going to be mostly wheelchair dependent and dependent for his ADL.  Son not sure how he is going to take him home   Family/ staff Communication:   Labs/tests ordered:   Total time spent in this patient care encounter was 45_ minutes; greater than 50% of the visit spent counseling patient, reviewing records , Labs and coordinating care for problems addressed at this encounter.

## 2018-05-17 ENCOUNTER — Other Ambulatory Visit (HOSPITAL_COMMUNITY)
Admission: RE | Admit: 2018-05-17 | Discharge: 2018-05-17 | Disposition: A | Discharge status: Home / Self Care | Payer: Medicare Other | Source: Skilled Nursing Facility | Attending: Gastroenterology | Admitting: Gastroenterology

## 2018-05-17 DIAGNOSIS — N183 Chronic kidney disease, stage 3 (moderate): Secondary | ICD-10-CM | POA: Insufficient documentation

## 2018-05-17 LAB — OCCULT BLOOD X 1 CARD TO LAB, STOOL: Fecal Occult Bld: NEGATIVE

## 2018-05-20 ENCOUNTER — Encounter (HOSPITAL_COMMUNITY)
Admission: RE | Admit: 2018-05-20 | Discharge: 2018-05-20 | Disposition: A | Discharge status: Home / Self Care | Payer: Medicare Other | Source: Skilled Nursing Facility | Attending: Internal Medicine | Admitting: Internal Medicine

## 2018-05-20 DIAGNOSIS — G9341 Metabolic encephalopathy: Secondary | ICD-10-CM | POA: Insufficient documentation

## 2018-05-20 DIAGNOSIS — I130 Hypertensive heart and chronic kidney disease with heart failure and stage 1 through stage 4 chronic kidney disease, or unspecified chronic kidney disease: Secondary | ICD-10-CM | POA: Insufficient documentation

## 2018-05-20 DIAGNOSIS — H109 Unspecified conjunctivitis: Secondary | ICD-10-CM | POA: Insufficient documentation

## 2018-05-20 LAB — CBC WITH DIFFERENTIAL/PLATELET
Abs Immature Granulocytes: 0.05 10*3/uL (ref 0.00–0.07)
Basophils Absolute: 0 10*3/uL (ref 0.0–0.1)
Basophils Relative: 0 %
EOS PCT: 1 %
Eosinophils Absolute: 0.1 10*3/uL (ref 0.0–0.5)
HCT: 28.3 % — ABNORMAL LOW (ref 39.0–52.0)
Hemoglobin: 8.3 g/dL — ABNORMAL LOW (ref 13.0–17.0)
Immature Granulocytes: 1 %
Lymphocytes Relative: 14 %
Lymphs Abs: 1.3 10*3/uL (ref 0.7–4.0)
MCH: 28.9 pg (ref 26.0–34.0)
MCHC: 29.3 g/dL — AB (ref 30.0–36.0)
MCV: 98.6 fL (ref 80.0–100.0)
Monocytes Absolute: 0.8 10*3/uL (ref 0.1–1.0)
Monocytes Relative: 9 %
Neutro Abs: 7.1 10*3/uL (ref 1.7–7.7)
Neutrophils Relative %: 75 %
PLATELETS: 332 10*3/uL (ref 150–400)
RBC: 2.87 MIL/uL — ABNORMAL LOW (ref 4.22–5.81)
RDW: 16.9 % — ABNORMAL HIGH (ref 11.5–15.5)
WBC: 9.3 10*3/uL (ref 4.0–10.5)
nRBC: 0 % (ref 0.0–0.2)

## 2018-05-20 LAB — BASIC METABOLIC PANEL
Anion gap: 7 (ref 5–15)
BUN: 41 mg/dL — ABNORMAL HIGH (ref 8–23)
CO2: 29 mmol/L (ref 22–32)
CREATININE: 1.42 mg/dL — AB (ref 0.61–1.24)
Calcium: 8.5 mg/dL — ABNORMAL LOW (ref 8.9–10.3)
Chloride: 104 mmol/L (ref 98–111)
GFR calc Af Amer: 49 mL/min — ABNORMAL LOW (ref >60)
GFR, EST NON AFRICAN AMERICAN: 42 mL/min — AB (ref >60)
Glucose, Bld: 91 mg/dL (ref 70–99)
Potassium: 4.3 mmol/L (ref 3.5–5.1)
Sodium: 140 mmol/L (ref 135–145)

## 2018-05-24 ENCOUNTER — Encounter: Payer: Self-pay | Admitting: Internal Medicine

## 2018-05-24 ENCOUNTER — Telehealth: Payer: Self-pay | Admitting: Family Medicine

## 2018-05-24 ENCOUNTER — Non-Acute Institutional Stay (SKILLED_NURSING_FACILITY): Payer: Medicare Other | Admitting: Internal Medicine

## 2018-05-24 DIAGNOSIS — N183 Chronic kidney disease, stage 3 unspecified: Secondary | ICD-10-CM

## 2018-05-24 DIAGNOSIS — R600 Localized edema: Secondary | ICD-10-CM

## 2018-05-24 DIAGNOSIS — D649 Anemia, unspecified: Secondary | ICD-10-CM

## 2018-05-24 DIAGNOSIS — I5032 Chronic diastolic (congestive) heart failure: Secondary | ICD-10-CM

## 2018-05-24 DIAGNOSIS — J431 Panlobular emphysema: Secondary | ICD-10-CM

## 2018-05-24 DIAGNOSIS — I25810 Atherosclerosis of coronary artery bypass graft(s) without angina pectoris: Secondary | ICD-10-CM

## 2018-05-24 DIAGNOSIS — G20A1 Parkinson's disease without dyskinesia, without mention of fluctuations: Secondary | ICD-10-CM

## 2018-05-24 DIAGNOSIS — G20 Parkinson's disease: Secondary | ICD-10-CM | POA: Diagnosis not present

## 2018-05-24 NOTE — Telephone Encounter (Signed)
Ms. [name] (pts daughter) calling requesting [name] call her when he has a chance to discuss [name] options and what should happen next. He is being discharged Thursday from [facility]. She states past couple of days his dementia has been severe and he has been combative. She was at the nursing home until 3 a.m. this morning. The nursing home feels he will do better at home in an environment he is comfortable with. They do have round the clock sitters lined up for him but she is wanting to know what steps would take place next if this does not work. Would it be hospice? She also states he is down to 115 lbs.   Her call back number is [phone].

## 2018-05-24 NOTE — Progress Notes (Signed)
Location:    [facility] Room Number: 125/P Place of Service:  SNF (31)  Provider: Granville Kadence PA-C  PCP: Kathyrn Drown, MD Patient Care Team: Kathyrn Drown, MD as PCP - General (Family Medicine)  Extended Emergency Contact Information Primary Emergency Contact: [facility] Address: [address]          [place], [place] [address] [place] of [place] Phone: [phone] Mobile Phone: [phone] Relation: Son Secondary Emergency Contact: [facility] Address: [address]          [place], [place] [address] [place] Phone: [phone] Relation: Spouse  Code Status: DNR Goals of care:  Advanced Directive information Advanced Directives 05/24/2018  Does Patient Have a Medical Advance Directive? Yes  Type of Advance Directive Out of facility DNR (pink MOST or yellow form)  Does patient want to make changes to medical advance directive? No - Patient declined  Copy of [place] in Chart? -  Would patient like information on creating a medical advance directive? No - Patient declined  Pre-existing out of facility DNR order (yellow form or pink MOST form) -     Allergies  Allergen Reactions  . Propranolol Nausea Only  . Levaquin [Levofloxacin In D5w] Hives and Nausea And Vomiting  . Penicillins Hives and Swelling    Patient has tolerated cephalosporins several times  . Sulfonamide Derivatives Hives and Swelling  . Sympathomimetics Other (See Comments)  . Tape Other (See Comments)    SKIN IS VERY THIN AND TEARS EASILY; PLEASE USE AN ALTERNATIVE TO TAPE!!    Chief Complaint  Patient presents with  . Discharge Note    Discharge Visit    HPI:  85 y.o. male seen today for discharge from facility later this week. He has a fairly complicated history.  Came to skilled nursing for therapy after hospital stay in early January for recurrent falls and metabolic encephalopathy.  His other diagnoses include  coronary artery disease with a history of CABG as well as Parkinson's disease chronic diastolic CHF as well as COPD hypertension hyperlipidemia and a history of lung cancer-squamous cell status post resection-this is followed at [facility].  He also has a history of cognitive impairment.  He and his wife live with his son who is very attentive.  He was having recurrent falls and sustained an odontoid fracture in 1 of the falls- he had an extensive work-up in the hospital including CT scan and echo that did not show anything acute.  He also has failed a voiding trial and has a Foley catheter he will have follow-up by urology.  During his stay here patient developed bilateral infiltrates and was treated with doxycycline he also had a swallowing study and is now on honey thick diet.  He also had worsening edema and with CHF his Demadex was restarted but then stopped because of worsening renal function-however this is been started at a lower dose recently at 10 mg a day.  It appears he has gained about 4 pounds over the past week but the edema appears to be stabilized this is mainly in his feet.  According to family he has been eating better the last few days which may account for some of the weight gain.  He also has 2 small wounds that are stage II 1 on his coccyx and one on his left heel this is followed by wound care and thought to be stable   Currently patient continues to have  a cough apparently productive of some tannish phlegm- he is denying any shortness of breath or chest pain he is on oxygen.  He also has a history of anemia with an element of iron deficiency he is on iron and hemoglobin has trended up recently up to 8.3.  There is also an element of chronic disease he does have chronic kidney disease creatinine actually showed slight improvement on lab done on February 7 at 1.42             Past Medical History:  Diagnosis Date  . ASCVD (arteriosclerotic cardiovascular  disease)     CABG in 04/1993; and negative stress nuclear study in 08/2001  . Benign essential tremor   . CAD (coronary artery disease)   . Cancer ([place])    skin  . Cerebrovascular disease    Right carotid bruit-40-69% left internal carotid artery stenosis in 4/06; followed [facility]  . Chronic kidney disease, stage II (mild)    Creatinine-1.6 in 09/2008  . COPD (chronic obstructive pulmonary disease) ([place])   . Degenerative joint disease of knee, left   . Gout   . Hiatal hernia   . Horseshoe kidney   . Hyperlipidemia   . Hypertension   . Lung cancer ([place]) 2013  . Normocytic anemia   . Prediabetes   . Pulmonary disease   . Renal insufficiency   . Syncope   . Tobacco abuse, in remission    40 pack years; discontinued in 1980    Past Surgical History:  Procedure Laterality Date  . CARDIAC SURGERY    . COLONOSCOPY W/ POLYPECTOMY  1985  . CORONARY ARTERY BYPASS GRAFT  1995  . LESION EXCISION    . PILONIDAL CYST EXCISION  1948      reports that he quit smoking about 44 years ago. His smoking use included cigarettes. He has a 40.00 pack-year smoking history. He quit smokeless tobacco use about 37 years ago.  His smokeless tobacco use included chew. He reports that he does not drink alcohol or use drugs. Social History   Socioeconomic History  . Marital status: Married    Spouse name: Not on file  . Number of children: 3  . Years of education: Not on file  . Highest education level: Not on file  Occupational History  . Occupation: retired    Comment: Clinical biochemist with [employer]  Social Needs  . Financial resource strain: Not hard at all  . Food insecurity:    Worry: Patient refused    Inability: Patient refused  . Transportation needs:    Medical: Patient refused    Non-medical: Patient refused  Tobacco Use  . Smoking status: Former Smoker    Packs/day: 1.00    Years: 40.00    Pack years: 40.00    Types: Cigarettes    Last attempt to quit: 08/14/1973    Years since  quitting: 44.8  . Smokeless tobacco: Former Systems developer    Types: [place] date: 12/05/1980  Substance and Sexual Activity  . Alcohol use: No    Alcohol/week: 0.0 standard drinks  . Drug use: No  . Sexual activity: Not Currently  Lifestyle  . Physical activity:    Days per week: Patient refused    Minutes per session: Patient refused  . Stress: Not on file  Relationships  . Social connections:    Talks on phone: Patient refused    Gets together: Patient refused    Attends religious service: Patient refused  Active member of club or organization: Patient refused    Attends meetings of clubs or organizations: Patient refused    Relationship status: Patient refused  . Intimate partner violence:    Fear of current or ex partner: Patient refused    Emotionally abused: Patient refused    Physically abused: Patient refused    Forced sexual activity: Patient refused  Other Topics Concern  . Not on file  Social History Narrative  . Not on file   Functional Status Survey:    Allergies  Allergen Reactions  . Propranolol Nausea Only  . Levaquin [Levofloxacin In D5w] Hives and Nausea And Vomiting  . Penicillins Hives and Swelling    Patient has tolerated cephalosporins several times  . Sulfonamide Derivatives Hives and Swelling  . Sympathomimetics Other (See Comments)  . Tape Other (See Comments)    SKIN IS VERY THIN AND TEARS EASILY; PLEASE USE AN ALTERNATIVE TO TAPE!!    Pertinent  Health Maintenance Due  Topic Date Due  . INFLUENZA VACCINE  Completed  . PNA vac Low Risk Adult  Completed    Medications: Outpatient Encounter Medications as of 05/24/2018  Medication Sig  . albuterol (PROVENTIL HFA;VENTOLIN HFA) 108 (90 Base) MCG/ACT inhaler Inhale 2 puffs into the lungs every 6 (six) hours as needed for wheezing or shortness of breath.  Marland Kitchen albuterol (PROVENTIL) (2.5 MG/3ML) 0.083% nebulizer solution Inhale 3 mLs into the lungs every 4 (four) hours as needed for wheezing or  shortness of breath.  . Artificial Tear Solution (SOOTHE XP) SOLN Place 2 drops into both eyes 2 (two) times daily as needed (for dry eyes).   Marland Kitchen aspirin EC 81 MG tablet Take 81 mg by mouth daily.  Marland Kitchen atorvastatin (LIPITOR) 40 MG tablet TAKE 1 TABLET (40 MG TOTAL) BY MOUTH DAILY.  . budesonide-formoterol (SYMBICORT) 160-4.5 MCG/ACT inhaler INHALE 2 PUFF INTO THE LUNGS 2 TIMES DAILY.  . carbidopa-levodopa (SINEMET IR) 25-100 MG tablet Take three tablets by mouth three times daily  . Ferrous Sulfate (IRON) 325 (65 Fe) MG TABS Take 1 tablet by mouth twice a day  . guaiFENesin (MUCINEX) 600 MG 12 hr tablet Take 1 tablet (600 mg total) by mouth 2 (two) times daily.  . metoprolol tartrate (LOPRESSOR) 25 MG tablet TAKE 1 TABLET BY MOUTH TWICE A DAY  . Multiple Vitamins-Minerals (CENTRUM SILVER 50+MEN) TABS Take 1 tablet by mouth daily.  . nitroGLYCERIN (NITROSTAT) 0.4 MG SL tablet Place 1 tablet (0.4 mg total) under the tongue every 5 (five) minutes as needed for chest pain. May take up to 3 doses per episode.  . pantoprazole (PROTONIX) 40 MG tablet TAKE 1 TABLET BY MOUTH DAILY.  Marland Kitchen sennosides-docusate sodium (SENOKOT-S) 8.6-50 MG tablet Take 1 tablet by mouth 2 (two) times daily.  . tamsulosin (FLOMAX) 0.4 MG CAPS capsule TAKE ONE CAPSULE BY MOUTH AT BEDTIME.  Marland Kitchen tiotropium (SPIRIVA) 18 MCG inhalation capsule Place 1 capsule (18 mcg total) into inhaler and inhale daily.  Marland Kitchen torsemide (DEMADEX) 10 MG tablet Take 10 mg by mouth daily.   No facility-administered encounter medications on file as of 05/24/2018.      Review of Systems  This is limited secondary to patient being poor historian provided by family as well.  In general does not complain of fever chills.  Skin is not complain of rashes or itching.  Head ears eyes nose mouth and throat is not complain of visual changes or sore throat.  Respiratory apparently he does have a cough productive  of some tan phlegm- does not complain of shortness of  breath beyond baseline he is on chronic oxygen.  Cardiac does not complain of chest pain has edema most prominently in his feet but this appears stabilized.  GI is not complaining of abdominal pain nausea vomiting diarrhea constipation.  Apparently appetite has improved the last few days.  GU has an indwelling Foley catheter with a history of urinary retention.  Musculoskeletal has general frailty is able to move all extremities x4 ambulates about 50 feet with a rolling walker in therapy continues to have significant weakness however.  Neurologic is positive for weakness does not complain of headache dizziness or syncope.  And psych does have some cognitive impairment appears to do relatively well with supportive care although continues to be quite weak        Vitals:   05/24/18 1505  BP: 117/63  Pulse: 98  Resp: 20  Temp: (!) 97.4 F (36.3 C)  TempSrc: Oral  Weight: 122 lb 9.6 oz (55.6 kg)  Height: 5\' 7"  (1.702 m)  Manual blood pressure this afternoon was 132/60-pulse was 88 Body mass index is 19.2 kg/m. Physical Exam  In general this is a pleasant elderly male in no distress lying comfortably in bed he does have a neck brace applied.  Skin is warm and dry he is not diaphoretic.  Eyes visual acuity appears to be intact sclera and conjunctive are clear.  Oropharynx mucous membranes appear to be moist he has a minimal amount of phlegm residue on his tongue this appears to be light tan.  Chest he has some slight rhonchi most prominently at the bases there is no labored breathing There is no labored breathing.  Abdomen is soft nontender with positive bowel sounds.  Musculoskeletal has generalized weakness but is able to move all extremities x4 and turn himself in bed he is ambulating apparently with a walker with therapy.  Neurologic is grossly intact speech is clear he does have generalized weakness but could not appreciate any lateralizing findings.  Psych he is  oriented to self he is pleasant follow simple verbal commands without difficulty but does not speak a lot  Labs reviewed: Basic Metabolic Panel: Recent Labs    10/29/17 1112  05/09/18 0130 05/13/18 0729 05/20/18 0755  NA 139   < > 143 145 140  K 4.1   < > 3.7 4.0 4.3  CL 103   < > 106 109 104  CO2 25   < > 29 30 29   GLUCOSE 124*   < > 87 90 91  BUN 57*   < > 54* 54* 41*  CREATININE 2.20*   < > 1.82* 1.64* 1.42*  CALCIUM 9.1   < > 8.5* 8.5* 8.5*  MG 2.2  --   --   --   --    < > = values in this interval not displayed.   Liver Function Tests: Recent Labs    03/16/18 0836 03/27/18 0828 04/14/18 0832  AST 16 22 20   ALT 4 5 5   ALKPHOS 83 72 81  BILITOT 0.4 0.9 0.5  PROT 5.6* 6.3* 6.2*  ALBUMIN 3.6 3.3* 3.3*   Recent Labs    10/29/17 1112  LIPASE 27   Recent Labs    04/14/18 1357  AMMONIA 9   CBC: Recent Labs    05/09/18 0130 05/13/18 0729 05/20/18 0755  WBC 9.8 8.5 9.3  NEUTROABS 7.9* 6.1 7.1  HGB 7.4* 7.6* 8.3*  HCT 24.6* 25.7*  28.3*  MCV 98.8 97.7 98.6  PLT 316 365 332   Cardiac Enzymes: Recent Labs    09/22/17 1947 10/29/17 1112 04/14/18 0832  TROPONINI <0.03 <0.03 <0.03   BNP: Invalid input(s): POCBNP CBG: No results for input(s): GLUCAP in the last 8760 hours.  Procedures and Imaging Studies During Stay: Dg Op Swallowing Func-medicare/speech Path  Result Date: 05/05/2018 [facility] [address] [place], [place], [address] Phone: [phone]   Fax:  [phone] Modified Barium Swallow Patient Details Name: ALEXES LAMARQUE MRN: [id number] Date of Birth: 25-Jul-1921 No data recorded Encounter Date: 05/05/2018 End of Session - 05/05/18 1532   Visit Number  1   Number of Visits  1   Authorization Type  [employer] Medicare   SLP Start Time  1300   SLP Stop Time   1330   SLP Time Calculation (min)  30 min   Activity Tolerance  Patient tolerated treatment well   Past Medical History: Diagnosis Date . ASCVD  (arteriosclerotic cardiovascular disease)    CABG in 04/1993; and negative stress nuclear study in 08/2001 . Benign essential tremor  . CAD (coronary artery disease)  . Cancer ([place])   skin . Cerebrovascular disease   Right carotid bruit-40-69% left internal carotid artery stenosis in 4/06; followed [facility] . Chronic kidney disease, stage II (mild)   Creatinine-1.6 in 09/2008 . COPD (chronic obstructive pulmonary disease) ([place])  . Degenerative joint disease of knee, left  . Gout  . Hiatal hernia  . Horseshoe kidney  . Hyperlipidemia  . Hypertension  . Lung cancer ([place]) 2013 . Normocytic anemia  . Prediabetes  . Pulmonary disease  . Renal insufficiency  . Syncope  . Tobacco abuse, in remission   40 pack years; discontinued in 1980 Past Surgical History: Procedure Laterality Date . CARDIAC SURGERY   . COLONOSCOPY W/ POLYPECTOMY  1985 . CORONARY ARTERY BYPASS GRAFT  1995 . LESION EXCISION   . PILONIDAL CYST EXCISION  1948 There were no vitals filed for this visit. Subjective Assessment - 05/05/18 1522   Subjective  "Fine."   Currently in Pain?  No/denies   General - 05/05/18 1522    General Information  Date of Onset  05/02/18   HPI  85 y.o.malewith medical history ofcoronary artery disease, COPD, Parkinson's disease, chronic diastolic CHF, horseshoe kidney, hypertension, hyperlipidemia, squamous cell lung cancer, and cognitive impairment presenting with altered mental status. The patient has been having mechanical falls for the last several months. The patient's son brought the patient to the emergency department on 03/27/2018. Work-up at that time revealed that the patient had an odontoid fracture. Neurosurgery was consulted at that time and felt the patient would benefit from nonoperative management. The patient was placed in an Aspen collar and discharged home. Pt was readmitted to [facility] in early January with increased cognitive decline and was discharged to [facility] for rehab. He is referred for MBSS due to suspicion of  aspiration PNA.   Type of Study  MBS-Modified Barium Swallow Study   Diet Prior to this Study  Dysphagia 3 (soft);Thin liquids   Temperature Spikes Noted  No   Respiratory Status  Room air   History of Recent Intubation  No   Behavior/Cognition  Alert;Cooperative;Requires cueing   Oral Cavity Assessment  Within Functional Limits   Oral Care Completed by SLP  No   Oral Cavity - Dentition  Missing dentition   Vision  Functional for self feeding   Self-Feeding Abilities  Able  to feed self;Needs assist   Patient Positioning  Upright in chair   Baseline Vocal Quality  Hoarse   Volitional Cough  Strong   Volitional Swallow  Able to elicit   Anatomy  Within functional limits   Pharyngeal Secretions  Not observed secondary MBS  barium appeared to mix with thick secretions  Oral Preparation/Oral Phase - 05/05/18 1528    Oral Preparation/Oral Phase  Oral Phase  Impaired    Oral - Thin  Oral - Thin Cup  Decreased bolus cohesion    Oral - Solids  Oral - Regular  Imparied mastication;Decreased bolus cohesion;Delayed A-P transit;Oral residue    Electrical stimulation - Oral Phase  Was Electrical Stimulation Used  No   Pharyngeal Phase - 05/05/18 1528    Pharyngeal Phase  Pharyngeal Phase  Impaired    Pharyngeal - Nectar  Pharyngeal- Nectar Cup  Swallow initiation at vallecula;Reduced epiglottic inversion;Reduced tongue base retraction;Pharyngeal residue - valleculae   Pharyngeal- Nectar Straw  Swallow initiation at vallecula;Reduced tongue base retraction;Pharyngeal residue - valleculae    Pharyngeal - Thin  Pharyngeal- Thin Teaspoon  Swallow initiation at vallecula;Reduced epiglottic inversion;Reduced tongue base retraction;Pharyngeal residue - valleculae   Pharyngeal- Thin Cup  Swallow initiation at vallecula;Swallow initiation at pyriform sinus;Reduced epiglottic inversion;Reduced tongue base retraction;Reduced airway/laryngeal closure;Penetration/Aspiration before swallow;Penetration/Aspiration during swallow;Trace  aspiration;Pharyngeal residue - valleculae   Pharyngeal  Material does not enter airway;Material enters airway, remains ABOVE vocal cords then ejected out;Material enters airway, passes BELOW cords then ejected out;Material enters airway, CONTACTS cords and not ejected out    Pharyngeal - Solids  Pharyngeal- Puree  Within functional limits   Pharyngeal- Regular  Reduced epiglottic inversion;Reduced tongue base retraction;Pharyngeal residue - valleculae   Pharyngeal- Pill  Within functional limits  in puree, transiently delayed in valleculae   Electrical Stimulation - Pharyngeal Phase  Was Electrical Stimulation Used  No   Cricopharyngeal Phase - 05/05/18 1531    Cervical Esophageal Phase  Cervical Esophageal Phase  Impaired    Cervical Esophageal Phase - Thin  Thin Cup  Prominent cricopharyngeal segment   Plan - 05/05/18 1533   Clinical Impression Statement  Pt presents with moderate oropharyngeal phase dysphagia characterized by reduced bolus cohesiveness with liquids, impaired mastication, premature spillage, decreased tongue base retraction and epiglottic deflection resulting in variable min penetration x2 to the vocal folds with cup sips thin from vallecular residuals spilling to pyriforms and around arytenoids. Pt had one episode where contrast extended below cords and was cleared with a cued cough. Pt achieved improved epiglottic deflection with puree and nectar-thickened liquids. Pt with mild/mod vallecular residue with regular solids which cleared with repeat/dry swallow. Pt reportedly with suspected aspiration PNA, however no gross aspiration observed during today's assessment. Pt is likely to experience trace aspiration from residuals after the swallow and/or from secretions. Pt wears cervical collar throughout the day at this time. Consider placing Pt on nectar-thick liquids given the above with dysphagia therapy to focus on effortful swallow, tongue base strengthening, and implementation of strategies  (complete dry swallows and clear throat periodically) until Pt is stronger overall. Swallow function was WNL during puree swallows. Recommend D3/mech soft and nectar-thick liquids; consider repeat objective study in 4-6 weeks or as clinically appropriate.  Patient will benefit from skilled therapeutic intervention in order to improve the following deficits and impairments:  Dysphagia, oropharyngeal phase Recommendations/Treatment - 05/05/18 1531    Swallow Evaluation Recommendations  SLP Diet Recommendations  Dysphagia 3 (mechanical soft);Psychologist, occupational user  Simply thick   Liquid Administration via  Cup   Medication Administration  Whole meds with puree   Supervision  Patient able to self feed;Full supervision/cueing for compensatory strategies   Compensations  Slow rate;Multiple dry swallows after each bite/sip;Clear throat intermittently;Effortful swallow   Postural Changes  Seated upright at 90 degrees;Remain upright for at least 30 minutes after feeds/meals   Problem List Patient Active Problem List  Diagnosis Date Noted . Recurrent falls 04/25/2018 . Protein-calorie malnutrition, severe 04/18/2018 . Hydronephrosis  . Syncope and collapse  . Acute metabolic encephalopathy 47/42/5956 . Failure to thrive in adult 04/14/2018 . Acute urinary retention 04/14/2018 . Fecal impaction ([place]) 04/14/2018 . Goals of care, counseling/discussion  . Palliative care by specialist  . DNR (do not resuscitate) discussion  . Gout 01/14/2018 . Lobar pneumonia, unspecified organism ([place]) 10/29/2017 . Pleural effusion, right  . Pedal edema 05/14/2017 . Parkinson's disease ([place]) 10/05/2016 . Diastolic CHF, chronic ([place]) 05/10/2015 . Dizziness  . Coronary artery disease involving coronary bypass graft of native heart without angina pectoris  . Near syncope 04/02/2015 . Diarrhea 04/02/2015 . 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 COPD exacerbation ([place]) 06/04/2013 . Shortness of breath 06/04/2013 . Acute respiratory  failure with hypoxia ([place]) 06/04/2013 . Hemoptysis 08/10/2012 . Malignant neoplasm of upper lobe, left bronchus or lung ([place]) 01/06/2012 . Prediabetes 08/28/2011 . Lung cancer ([place]) 08/25/2011 . COPD (chronic obstructive pulmonary disease) ([place]) 08/25/2011 . CKD (chronic kidney disease), stage III ([place]) 12/07/2010 . Chest pain 08/18/2010 . Abdominal pain 08/18/2010 . Syncope  . Tobacco abuse, in remission  . Degenerative joint disease of knee, left  . COLONIC POLYPS 05/20/2009 . Hyperlipidemia 05/20/2009 . ANEMIA 05/20/2009 . Essential tremor 05/20/2009 . ATHEROSCLEROTIC CARDIOVASCULAR DISEASE 05/20/2009 . CEREBROVASCULAR DISEASE 05/20/2009 . Essential hypertension 01/14/2009 Thank you, Genene Churn, [facility] 05/05/2018, 3:39 PM [place], [place], [address] Phone: [phone]   Fax:  [phone] Name: DORRELL MITCHELTREE MRN: [id number] Date of Birth: February 18, 1922 CLINICAL DATA:  Dysphagia, history of fall with C2 fracture EXAM: MODIFIED BARIUM SWALLOW TECHNIQUE: Different consistencies of barium were administered orally to the patient by the Speech Pathologist. Imaging of the pharynx was performed in the lateral projection. FLUOROSCOPY TIME:  Fluoroscopy Time:  5 minutes 30 seconds Radiation Exposure Index (if provided by the fluoroscopic device): 33.7 mGy Number of Acquired Spot Images: Multiple fluoroscopic series COMPARISON:  None FINDINGS: Thin liquid-with teaspoon, normal motion is seen without laryngeal penetration or aspiration. Vallecular residuals and minimal piriform sinus residuals are noted. With sips of thin barium by cup, laryngeal penetration was identified, predominantly from contrast at the arytenoids and piriform sinuses, with contrast extending onto the vocal cords. No aspiration. Less laryngeal penetration was seen with bolus holding prior to swallow. Vallecular residuals persisted. Nectar thick liquid-vallecular  residuals. No laryngeal penetration or aspiration. Honey-not evaluated Pure- within normal limits Cracker-within normal limits Pure with cracker-not evaluated Barium tablet-swallow with applesauce without difficulty. Incidentally noted is hypomotility of the thoracic esophagus. IMPRESSION: Laryngeal penetration of thin liquid contrast to the level of the vocal cords for, primarily from contrast at the RIGHT nodes and the piriform sinuses. No aspiration of contrast into the trachea. Remainder of exam unremarkable. Please refer to the Speech Pathologists report for complete details and recommendations. Electronically Signed   By: Lavonia Dana M.D.   On: 05/05/2018 13:34    Assessment/Plan:    #1 history of fall with metabolic encephalopathy with history  of Parkinson's-she has gained some strength here but therapy has been a challenge he will need continued PT and OT as well as nursing support and CNA to help him at home as well as 24-hour care.  He does continue on Sinemet.  2.  History of diastolic CHF- he continues on low-dose Demadex again this was held at one point because of his renal issues- at this point this appears stabilized on current dose of Demadex at 10 mg a day-he has gained a few pounds this week but he is eating better edema appears to be relatively baseline.  At this point will monitor and defer to primary care provider for any increase or decrease in diuretics.  He also continues on a beta-blocker No   3 history of coronary artery disease status post CABG he is on aspirin beta-blocker and a statin he has been largely asymptomatic during his stay here.  4.  History of COPD continues on Symbicort Spiriva also has an order for Mucinex and Ventolin as needed- lung exam was basically baseline this evening-apparently is coughing up some tan sputum I have spoken with nursing to keep an eye on this but clinically he appears to be at baseline at this point continue current  medications.  Again he is on Mucinex twice daily routinely.  5.-  Hypertension- he is on metoprolol 25 mg twice daily this appears stable occasionally see systolics listed in the 75F but these are done by machine I believe this does not appear to be the norm- I got 132/60 manually this evening- at this point will monitor he is asymptomatic of hypotension with no reports of syncope dizziness.  6.-History of urinary retention he has failed a voiding trial he does have a Foley catheter he will have follow-up with urology-he is also on Flomax.  7.  History of chronic kidney disease creatinine has shown slight improvement from recent levels at 1.42 again will have follow-up by primary care provider on Monday, February 17.  8.  History of anemia thought to have an element of iron deficiency with low iron level on lab as well as a history of chronic disease-hemoglobin has trended up slightly  from previous level--now 8.3 it was 7.6 this will warrant follow-up by primary care provider as well  #9 history of dysphasia continues on a honey thick liquids--he has received antibiotics for suspected aspiration in the past-at this point appears to be stable---- howevehis cough will have to be watched however.  10.  History of odontoid fracture he continues with the neck brace and will have follow-up by neurosurgery.  11.  History of lung carcinoma status post resection again this is followed at [facility] is aware and will be trying to arrange follow-up there.  Again he will be going home with a very supportive son- patient spouse also is there.  He will need a 24-hour sitter which apparently has been arranged he will need PT OT nursing support and CNA support for significant weakness and multiple medical issues.  He also will need a nebulizer and oxygen.  For wounds he will be followed by the wound nurse home health both wounds on the left heel and coccyx are thought to be stable without  infection.  CPT- [id number]-of note greater than 30 minutes spent on this discharge summary- greater than 50% times coordinating a plan of care for numerous diagnoses

## 2018-05-24 NOTE — Telephone Encounter (Signed)
Please advise 

## 2018-05-25 ENCOUNTER — Non-Acute Institutional Stay (SKILLED_NURSING_FACILITY): Payer: Medicare Other | Admitting: Internal Medicine

## 2018-05-25 ENCOUNTER — Other Ambulatory Visit: Payer: Self-pay | Admitting: Family Medicine

## 2018-05-25 DIAGNOSIS — L03116 Cellulitis of left lower limb: Secondary | ICD-10-CM | POA: Diagnosis not present

## 2018-05-25 DIAGNOSIS — J431 Panlobular emphysema: Secondary | ICD-10-CM

## 2018-05-25 DIAGNOSIS — J449 Chronic obstructive pulmonary disease, unspecified: Secondary | ICD-10-CM

## 2018-05-25 NOTE — Progress Notes (Signed)
This is an acute visit.  Level of care skilled.  Facility is [facility].  Chief complaint acute visit secondary to increased erythema edema right foot.  History of present illness. Patient is a pleasant 85 year old male who is actually slated for discharge from facility tomorrow.  He has a complicated medical history he was here for therapy after hospital stay in early January for recurrent falls and metabolic encephalopathy-he also has coronary artery disease with history of CABG in addition of Parkinson's disease diastolic CHF COPD hypertension hyperlipidemia and history of lung cancer which is followed by [facility] he has had a previous resection.  He also has cognitive impairment.Marland Kitchen  He is also being treated for an odontoid fracture suffered in a fall and this is followed by neurology.  His conditions appear to have stabilized relatively speaking and he was slated to go home tomorrow.  However this evening nursing staff is noted he has had some increased edema and erythema of his left foot.  He does not appear to be having acute pain with this but appears to have some mildly increased tenderness.  There is been no history of trauma to my knowledge.   Past Medical History:  Diagnosis Date  . ASCVD (arteriosclerotic cardiovascular disease)     CABG in 04/1993; and negative stress nuclear study in 08/2001  . Benign essential tremor   . CAD (coronary artery disease)   . Cancer ([place])    skin  . Cerebrovascular disease    Right carotid bruit-40-69% left internal carotid artery stenosis in 4/06; followed [facility]  . Chronic kidney disease, stage II (mild)    Creatinine-1.6 in 09/2008  . COPD (chronic obstructive pulmonary disease) ([place])   . Degenerative joint disease of knee, left   . Gout   . Hiatal hernia   . Horseshoe kidney   . Hyperlipidemia   . Hypertension   . Lung cancer ([place]) 2013  . Normocytic anemia   . Prediabetes   . Pulmonary disease   . Renal  insufficiency   . Syncope   . Tobacco abuse, in remission    40 pack years; discontinued in 1980         Past Surgical History:  Procedure Laterality Date  . CARDIAC SURGERY    . COLONOSCOPY W/ POLYPECTOMY  1985  . CORONARY ARTERY BYPASS GRAFT  1995  . LESION EXCISION    . PILONIDAL CYST EXCISION  1948      reports that he quit smoking about 44 years ago. His smoking use included cigarettes. He has a 40.00 pack-year smoking history. He quit smokeless tobacco use about 37 years ago.  His smokeless tobacco use included chew. He reports that he does not drink alcohol or use drugs. Social History        Socioeconomic History  . Marital status: Married    Spouse name: Not on file  . Number of children: 3  . Years of education: Not on file  . Highest education level: Not on file  Occupational History  . Occupation: retired    Comment: Clinical biochemist with [employer]  Social Needs  . Financial resource strain: Not hard at all  . Food insecurity:    Worry: Patient refused    Inability: Patient refused  . Transportation needs:    Medical: Patient refused    Non-medical: Patient refused  Tobacco Use  . Smoking status: Former Smoker    Packs/day: 1.00    Years: 40.00    Pack years: 40.00  Types: Cigarettes    Last attempt to quit: 08/14/1973    Years since quitting: 44.8  . Smokeless tobacco: Former Systems developer    Types: [place] date: 12/05/1980  Substance and Sexual Activity  . Alcohol use: No    Alcohol/week: 0.0 standard drinks  . Drug use: No  . Sexual activity: Not Currently  Lifestyle  . Physical activity:    Days per week: Patient refused    Minutes per session: Patient refused  . Stress: Not on file  Relationships  . Social connections:    Talks on phone: Patient refused    Gets together: Patient refused    Attends religious service: Patient refused    Active member of club or organization: Patient refused      Attends meetings of clubs or organizations: Patient refused    Relationship status: Patient refused  . Intimate partner violence:    Fear of current or ex partner: Patient refused    Emotionally abused: Patient refused    Physically abused: Patient refused    Forced sexual activity: Patient refused  Other Topics Concern  . Not on file  Social History Narrative  . Not on file   Functional Status Survey:       Allergies  Allergen Reactions  . Propranolol Nausea Only  . Levaquin [Levofloxacin In D5w] Hives and Nausea And Vomiting  . Penicillins Hives and Swelling    Patient has tolerated cephalosporins several times  . Sulfonamide Derivatives Hives and Swelling  . Sympathomimetics Other (See Comments)  . Tape Other (See Comments)    SKIN IS VERY THIN AND TEARS EASILY; PLEASE USE AN ALTERNATIVE TO TAPE!!        Pertinent  Health Maintenance Due  Topic Date Due  . INFLUENZA VACCINE  Completed  . PNA vac Low Risk Adult  Completed    Medications:        Medication Sig  . albuterol (PROVENTIL HFA;VENTOLIN HFA) 108 (90 Base) MCG/ACT inhaler Inhale 2 puffs into the lungs every 6 (six) hours as needed for wheezing or shortness of breath.  Marland Kitchen albuterol (PROVENTIL) (2.5 MG/3ML) 0.083% nebulizer solution Inhale 3 mLs into the lungs every 4 (four) hours as needed for wheezing or shortness of breath.  . Artificial Tear Solution (SOOTHE XP) SOLN Place 2 drops into both eyes 2 (two) times daily as needed (for dry eyes).   Marland Kitchen aspirin EC 81 MG tablet Take 81 mg by mouth daily.  Marland Kitchen atorvastatin (LIPITOR) 40 MG tablet TAKE 1 TABLET (40 MG TOTAL) BY MOUTH DAILY.  . budesonide-formoterol (SYMBICORT) 160-4.5 MCG/ACT inhaler INHALE 2 PUFF INTO THE LUNGS 2 TIMES DAILY.  . carbidopa-levodopa (SINEMET IR) 25-100 MG tablet Take three tablets by mouth three times daily  . Ferrous Sulfate (IRON) 325 (65 Fe) MG TABS Take 1 tablet by mouth twice a day  . guaiFENesin (MUCINEX) 600  MG 12 hr tablet Take 1 tablet (600 mg total) by mouth 2 (two) times daily.  . metoprolol tartrate (LOPRESSOR) 25 MG tablet TAKE 1 TABLET BY MOUTH TWICE A DAY  . Multiple Vitamins-Minerals (CENTRUM SILVER 50+MEN) TABS Take 1 tablet by mouth daily.  . nitroGLYCERIN (NITROSTAT) 0.4 MG SL tablet Place 1 tablet (0.4 mg total) under the tongue every 5 (five) minutes as needed for chest pain. May take up to 3 doses per episode.  . pantoprazole (PROTONIX) 40 MG tablet TAKE 1 TABLET BY MOUTH DAILY.  Marland Kitchen sennosides-docusate sodium (SENOKOT-S) 8.6-50 MG tablet Take 1 tablet  by mouth 2 (two) times daily.  . tamsulosin (FLOMAX) 0.4 MG CAPS capsule TAKE ONE CAPSULE BY MOUTH AT BEDTIME.  Marland Kitchen tiotropium (SPIRIVA) 18 MCG inhalation capsule Place 1 capsule (18 mcg total) into inhaler and inhale daily.  Marland Kitchen torsemide (DEMADEX) 10 MG tablet Take 10 mg by mouth daily.   No facility-administered encounter medications on file as of 05/24/2018.     Review of systems this is somewhat limited to patient being a poor historian.  In general does not complain of fever chills.  Skin as noted has had some increased erythema of his left foot  He also has a sacral area that is treated by wound care with no sign of infection.  Head ears eyes nose mouth and throat is not complain of visual changes or sore throat.  Respiratory does not complain of increased shortness of breath or cough beyond baseline.  Cardiac does not complain of chest pain has some pedal edema bilaterally.  GI is not complaining of abdominal pain nausea vomiting diarrhea constipation apparently has been eating better somewhat lately.  GU he does have an indwelling Foley catheter with a history of urinary retention followed by urology.  Musculoskeletal continues with generalized weakness is not complaining of joint pain currently.  Neurologic he does not complain of dizziness headache or syncope.  And psych does have some cognitive impairment but he is  pleasant appropriate does not appear overtly depressed or anxious   Physical exam.  Temperature is 97.0 pulse 77 respirations 18 blood pressure 116/55 O2 saturation is 97%  General this is a pleasant elderly male in no distress.  His skin is warm and dry he does have some increased erythema the top of his left foot this is slightly warm to touch.  Eyes visual acuity appears to be intact sclera and conjunctive are clear.  Oropharynx is clear mucous membranes moist.  He continues with a neck brace.  Chest clear to auscultation with somewhat shallow air entry.  Heart is regular rate and rhythm without murmur gallop or rub he has pedal edema bilaterally appears to be equal both feet- pedal pulses are palpable  Abdomen is soft nontender with positive bowel sounds.  Musculoskeletal appears able to move his extremities x4 again he does have increased erythema on his left foot approximately some mild tenderness to palpation.  Neurologic is grossly intact his speech is clear.  Psych he is oriented to self he is pleasant and appropriate.  Labs  May 20, 2018.  WBC 9.3 hemoglobin 8.3 platelets 332   Sodium 140 potassium 4.3 BUN 41 creatinine 1.42.  Assessment and plan.  Increased erythema of the left foot with some mild tenderness question cellulitis- will start him empirically on doxycycline 100 mg twice daily this will have to be spaced between his iron supplements secondary to interaction  Will have area monitored for any increased erythema-I have spoken with his son and he is open to patient stay a little longer if need be   [id number]--of note greater than 25 minutes was spent assessing patient-discussing status with nursing staff as well as with his son in the facility and later via phone

## 2018-05-25 NOTE — Telephone Encounter (Signed)
FYI

## 2018-05-25 NOTE — Telephone Encounter (Signed)
Contacted [name] and informed her we have canceled Monday's appt and [name] will come by next Wednesday after the office closes for a home visit. She said that works great for them.

## 2018-05-25 NOTE — Telephone Encounter (Signed)
I did have a conversation with family Also what mine saw the patient at the nursing home/[facility]  They will be going home on Thursday After discussion with family and patient please go ahead make referral for hospice Patient with end-stage COPD and significant weakness would benefit from hospice consultation Family desires not to go back into hospital setting and would prefer comfort care measures  Also please have the front place [name] on my schedule for next week preferably Wednesday 5 PM for home visit

## 2018-05-25 NOTE — Telephone Encounter (Signed)
Hospice referral placed. Asked front to place patient on schedule for next Wednesday. Front advised that patient has follow up visit schedule for Monday. Should pt keep that appointment?

## 2018-05-25 NOTE — Telephone Encounter (Signed)
Cancel follow-up office visit here on Monday Please communicate with family and let them know we do a home visit next week Wednesday would be fine If they need me sooner let me know

## 2018-05-26 ENCOUNTER — Encounter: Payer: Self-pay | Admitting: Internal Medicine

## 2018-05-26 ENCOUNTER — Non-Acute Institutional Stay (SKILLED_NURSING_FACILITY): Payer: Medicare Other | Admitting: Internal Medicine

## 2018-05-26 DIAGNOSIS — L03116 Cellulitis of left lower limb: Secondary | ICD-10-CM | POA: Diagnosis not present

## 2018-05-26 NOTE — Progress Notes (Signed)
Location:    [place] Room Number: 125/P Place of Service:  SNF [phone]) Provider:  Ewell Poe, MD  Patient Care Team: Kathyrn Drown, MD as PCP - General [facility] Medicine)  Extended Emergency Contact Information Primary Emergency Contact: [facility] Address: [address]          [place], [place] [address] [name] of [place] Phone: [phone] Mobile Phone: [phone] Relation: Son Secondary Emergency Contact: [facility] Address: [address]          [place], [place] [address] [place] Phone: [phone] Relation: Spouse  Code Status:  DNR Goals of care: Advanced Directive information Advanced Directives 05/26/2018  Does Patient Have a Medical Advance Directive? Yes  Type of Advance Directive Out of facility DNR (pink MOST or yellow form)  Does patient want to make changes to medical advance directive? No - Patient declined  Copy of [place] in Chart? -  Would patient like information on creating a medical advance directive? No - Patient declined  Pre-existing out of facility DNR order (yellow form or pink MOST form) -     Chief Complaint  Patient presents with  . Acute Visit    FlU Cellulitis    HPI:  Pt is a 85 y.o. male seen today for an acute visit for follow-up of erythema of the left foot.  He is actually slated for discharge today and has a complicated medical history- he has been here for therapy after hospitalization for recurrent falls and encephalopathy-he also has a history of coronary artery disease in addition to Parkinson's disease diastolic CHF COPD hypertension hyperlipidemia and a history of lung cancer which is followed at [facility] he did have a resection previously.  Is also being treated for odontoid fracture suffered after a fall and followed by neurology.  He appears to be relatively stable although continues to be quite fragile is actually completed  antibiotic recently for suspected pneumonia.  Respiratory status at this point appears stable.  Nursing during assessment last night noted he appeared to have some increased erythema of his left foot and I did look at it.  There were concerns for cellulitis with some mild tenderness and mild erythema and he has been started on a 5-day course of doxycycline.  Today the foot actually appears improved the erythema appears less and there appears to be less tenderness he does have pedal edema bilaterally but this is not increased from previous exam and his edema has improved significantly during his stay here.  Vital signs continue to be stable he is looking forward to going home      Past Medical History:  Diagnosis Date  . ASCVD (arteriosclerotic cardiovascular disease)     CABG in 04/1993; and negative stress nuclear study in 08/2001  . Benign essential tremor   . CAD (coronary artery disease)   . Cancer ([place])    skin  . Cerebrovascular disease    Right carotid bruit-40-69% left internal carotid artery stenosis in 4/06; followed [facility]  . Chronic kidney disease, stage II (mild)    Creatinine-1.6 in 09/2008  . COPD (chronic obstructive pulmonary disease) ([place])   . Degenerative joint disease of knee, left   . Gout   . Hiatal hernia   . Horseshoe kidney   . Hyperlipidemia   . Hypertension   . Lung cancer ([place]) 2013  . Normocytic anemia   . Prediabetes   . Pulmonary disease   .  Renal insufficiency   . Syncope   . Tobacco abuse, in remission    40 pack years; discontinued in 1980   Past Surgical History:  Procedure Laterality Date  . CARDIAC SURGERY    . COLONOSCOPY W/ POLYPECTOMY  1985  . CORONARY ARTERY BYPASS GRAFT  1995  . LESION EXCISION    . PILONIDAL CYST EXCISION  1948    Allergies  Allergen Reactions  . Propranolol Nausea Only  . Levaquin [Levofloxacin In D5w] Hives and Nausea And Vomiting  . Penicillins Hives and Swelling    Patient has tolerated cephalosporins  several times  . Sulfonamide Derivatives Hives and Swelling  . Sympathomimetics Other (See Comments)  . Tape Other (See Comments)    SKIN IS VERY THIN AND TEARS EASILY; PLEASE USE AN ALTERNATIVE TO TAPE!!    Outpatient Encounter Medications as of 05/26/2018  Medication Sig  . acetaminophen (TYLENOL) 325 MG tablet Take 650 mg by mouth every 6 (six) hours as needed.  Marland Kitchen albuterol (PROVENTIL HFA;VENTOLIN HFA) 108 (90 Base) MCG/ACT inhaler Inhale 2 puffs into the lungs every 6 (six) hours as needed for wheezing or shortness of breath.  Marland Kitchen albuterol (PROVENTIL) (2.5 MG/3ML) 0.083% nebulizer solution Inhale 3 mLs into the lungs every 4 (four) hours as needed for wheezing or shortness of breath.  . Artificial Tear Solution (SOOTHE XP) SOLN Place 2 drops into both eyes 2 (two) times daily as needed (for dry eyes).   Marland Kitchen aspirin EC 81 MG tablet Take 81 mg by mouth daily.  Marland Kitchen atorvastatin (LIPITOR) 40 MG tablet TAKE 1 TABLET (40 MG TOTAL) BY MOUTH DAILY.  . budesonide-formoterol (SYMBICORT) 160-4.5 MCG/ACT inhaler INHALE 2 PUFF INTO THE LUNGS 2 TIMES DAILY.  . carbidopa-levodopa (SINEMET IR) 25-100 MG tablet Take three tablets by mouth three times daily  . doxycycline (VIBRAMYCIN) 100 MG capsule Take 100 mg by mouth 2 (two) times daily. Take for 5 days from 05/25/2018-05/30/2018  . Ferrous Sulfate (IRON) 325 (65 Fe) MG TABS Take 1 tablet by mouth twice a day  . guaiFENesin (MUCINEX) 600 MG 12 hr tablet Take 1 tablet (600 mg total) by mouth 2 (two) times daily.  . metoprolol tartrate (LOPRESSOR) 25 MG tablet TAKE 1 TABLET BY MOUTH TWICE A DAY  . Multiple Vitamins-Minerals (CENTRUM SILVER 50+MEN) TABS Take 1 tablet by mouth daily.  . nitroGLYCERIN (NITROSTAT) 0.4 MG SL tablet Place 1 tablet (0.4 mg total) under the tongue every 5 (five) minutes as needed for chest pain. May take up to 3 doses per episode.  . pantoprazole (PROTONIX) 40 MG tablet TAKE 1 TABLET BY MOUTH DAILY.  . Probiotic Product (RISA-BID  PROBIOTIC) TABS Take 1 tablet by mouth twice  Day from 05/25/2018-06/04/2018  . sennosides-docusate sodium (SENOKOT-S) 8.6-50 MG tablet Take 1 tablet by mouth 2 (two) times daily.  . tamsulosin (FLOMAX) 0.4 MG CAPS capsule TAKE ONE CAPSULE BY MOUTH AT BEDTIME.  Marland Kitchen tiotropium (SPIRIVA) 18 MCG inhalation capsule Place 1 capsule (18 mcg total) into inhaler and inhale daily.  Marland Kitchen torsemide (DEMADEX) 10 MG tablet Take 10 mg by mouth daily.   No facility-administered encounter medications on file as of 05/26/2018.     Review of Systems   This is limited secondary to patient being a poor historian.  In general is not complaining of any fever or chills.  Skin does appear to have some increased erythema of the left foot which appears improved this morning.  Head ears eyes nose mouth and throat is not complaining  of any sore throat or visual changes.  Respiratory does not complain of shortness of breath or cough he is on chronic oxygen.  Cardiac does not complain of chest pain his edema appears mainly confined to his feet.  GI is not complaining of abdominal pain nausea vomiting diarrhea constipation.  GU does not complain of dysuria he does have an indwelling Foley catheter.  Musculoskeletal continues with generalized weakness but has gained some strength does not complain of joint pain currently.  Neurologic does not complain of dizziness syncope or numbness.  And psych does have some cognitive impairment but continues to be pleasant appropriate  Immunization History  Administered Date(s) Administered  . Influenza Split 01/10/2013  . Influenza,inj,Quad PF,6+ Mos 01/10/2014, 01/07/2015, 01/09/2016, 02/09/2017, 12/14/2017  . Influenza-Unspecified 01/12/2012  . Pneumococcal Conjugate-13 10/27/2013  . Pneumococcal Polysaccharide-23 01/11/2001, 07/13/2007  . Td 11/26/2004, 05/23/2015  . Tdap 11/22/2014  . Zoster 02/28/2008  . Zoster Recombinat (Shingrix) 03/18/2018   Pertinent  Health  Maintenance Due  Topic Date Due  . INFLUENZA VACCINE  Completed  . PNA vac Low Risk Adult  Completed   Fall Risk  09/06/2015 01/07/2015 04/20/2014 09/19/2013  Falls in the past year? No No Yes No  Number falls in past yr: - - 2 or more -  Risk Factor Category  - - High Fall Risk -  Risk for fall due to : - - History of fall(s);Impaired mobility -   Functional Status Survey:    Vitals:   05/26/18 1123  BP: (!) 113/57  Pulse: 82  Resp: (!) 22  Temp: (!) 96.5 F (35.8 C)  TempSrc: Oral  SpO2: 91%  Weight: 119 lb 12.8 oz (54.3 kg)  Height: 5\' 7"  (1.702 m)   Body mass index is 18.76 kg/m. Physical Exam   General this is a pleasant elderly male in no distress sitting comfortably in his wheelchair he is about to eat breakfast.  His skin is warm and dry he does have continued erythema of the left foot but this appears less remarkable than yesterday it is a bit paler has not increased in area- area is not really tender today.  Eyes visual acuity appears to be intact sclera and conjunctive are clear.  Oropharynx is clear mucous membranes moist.  Neck he continues with a collar in place.  Chest no labored breathing continues to have somewhat shallow air entry- small amount of rhonchi but this is not unusual.  Abdomen is soft nontender with positive bowel sounds.  Musculoskeletal moves all extremities x4 at baseline with lower extremity weakness.  Neurologic is grossly intact his speech is clear cannot really appreciate lateralizing findings.  Psych he is oriented to self he is pleasant appropriate follow simple verbal commands without any difficulty.   Labs reviewed: Recent Labs    10/29/17 1112  05/09/18 0130 05/13/18 0729 05/20/18 0755  NA 139   < > 143 145 140  K 4.1   < > 3.7 4.0 4.3  CL 103   < > 106 109 104  CO2 25   < > 29 30 29   GLUCOSE 124*   < > 87 90 91  BUN 57*   < > 54* 54* 41*  CREATININE 2.20*   < > 1.82* 1.64* 1.42*  CALCIUM 9.1   < > 8.5* 8.5* 8.5*    MG 2.2  --   --   --   --    < > = values in this interval not displayed.  Recent Labs    03/16/18 0836 03/27/18 0828 04/14/18 0832  AST 16 22 20   ALT 4 5 5   ALKPHOS 83 72 81  BILITOT 0.4 0.9 0.5  PROT 5.6* 6.3* 6.2*  ALBUMIN 3.6 3.3* 3.3*   Recent Labs    05/09/18 0130 05/13/18 0729 05/20/18 0755  WBC 9.8 8.5 9.3  NEUTROABS 7.9* 6.1 7.1  HGB 7.4* 7.6* 8.3*  HCT 24.6* 25.7* 28.3*  MCV 98.8 97.7 98.6  PLT 316 365 332   Lab Results  Component Value Date   TSH 7.565 (H) 04/14/2018   Lab Results  Component Value Date   HGBA1C 5.3 01/07/2015   Lab Results  Component Value Date   CHOL 152 03/16/2018   HDL 81 03/16/2018   LDLCALC 62 03/16/2018   TRIG 45 03/16/2018   CHOLHDL 1.9 03/16/2018    Significant Diagnostic Results in last 30 days:  Dg Op Swallowing Func-medicare/speech Path  Result Date: 05/05/2018 [place], [place], [address] Phone: [phone]   Fax:  [phone] Modified Barium Swallow Patient Details Name: DRAKEN FARRIOR MRN: [id number] Date of Birth: 1921/06/10 No data recorded Encounter Date: 05/05/2018 End of Session - 05/05/18 1532   Visit Number  1   Number of Visits  1   Authorization Type  [employer] Medicare   SLP Start Time  1300   SLP Stop Time   1330   SLP Time Calculation (min)  30 min   Activity Tolerance  Patient tolerated treatment well   Past Medical History: Diagnosis Date . ASCVD (arteriosclerotic cardiovascular disease)    CABG in 04/1993; and negative stress nuclear study in 08/2001 . Benign essential tremor  . CAD (coronary artery disease)  . Cancer ([place])   skin . Cerebrovascular disease   Right carotid bruit-40-69% left internal carotid artery stenosis in 4/06; followed [facility] . Chronic kidney disease, stage II (mild)   Creatinine-1.6 in 09/2008 . COPD (chronic obstructive pulmonary disease) ([place])  . Degenerative joint disease of knee, left  . Gout  . Hiatal hernia  . Horseshoe kidney  .  Hyperlipidemia  . Hypertension  . Lung cancer ([place]) 2013 . Normocytic anemia  . Prediabetes  . Pulmonary disease  . Renal insufficiency  . Syncope  . Tobacco abuse, in remission   40 pack years; discontinued in 1980 Past Surgical History: Procedure Laterality Date . CARDIAC SURGERY   . COLONOSCOPY W/ POLYPECTOMY  1985 . CORONARY ARTERY BYPASS GRAFT  1995 . LESION EXCISION   . PILONIDAL CYST EXCISION  1948 There were no vitals filed for this visit. Subjective Assessment - 05/05/18 1522   Subjective  "Fine."   Currently in Pain?  No/denies   General - 05/05/18 1522    General Information  Date of Onset  05/02/18   HPI  85 y.o.malewith medical history ofcoronary artery disease, COPD, Parkinson's disease, chronic diastolic CHF, horseshoe kidney, hypertension, hyperlipidemia, squamous cell lung cancer, and cognitive impairment presenting with altered mental status. The patient has been having mechanical falls for the last several months. The patient's son brought the patient to the emergency department on 03/27/2018. Work-up at that time revealed that the patient had an odontoid fracture. Neurosurgery was consulted at that time and felt the patient would benefit from nonoperative management. The patient was placed in an Aspen collar and discharged home. Pt was readmitted to [facility] in early January with increased cognitive decline and was discharged to [facility] for rehab. He is referred  for MBSS due to suspicion of aspiration PNA.   Type of Study  MBS-Modified Barium Swallow Study   Diet Prior to this Study  Dysphagia 3 (soft);Thin liquids   Temperature Spikes Noted  No   Respiratory Status  Room air   History of Recent Intubation  No   Behavior/Cognition  Alert;Cooperative;Requires cueing   Oral Cavity Assessment  Within Functional Limits   Oral Care Completed by SLP  No   Oral Cavity - Dentition  Missing dentition   Vision  Functional for self feeding   Self-Feeding Abilities  Able to feed self;Needs assist   Patient  Positioning  Upright in chair   Baseline Vocal Quality  Hoarse   Volitional Cough  Strong   Volitional Swallow  Able to elicit   Anatomy  Within functional limits   Pharyngeal Secretions  Not observed secondary MBS  barium appeared to mix with thick secretions  Oral Preparation/Oral Phase - 05/05/18 1528    Oral Preparation/Oral Phase  Oral Phase  Impaired    Oral - Thin  Oral - Thin Cup  Decreased bolus cohesion    Oral - Solids  Oral - Regular  Imparied mastication;Decreased bolus cohesion;Delayed A-P transit;Oral residue    Electrical stimulation - Oral Phase  Was Electrical Stimulation Used  No   Pharyngeal Phase - 05/05/18 1528    Pharyngeal Phase  Pharyngeal Phase  Impaired    Pharyngeal - Nectar  Pharyngeal- Nectar Cup  Swallow initiation at vallecula;Reduced epiglottic inversion;Reduced tongue base retraction;Pharyngeal residue - valleculae   Pharyngeal- Nectar Straw  Swallow initiation at vallecula;Reduced tongue base retraction;Pharyngeal residue - valleculae    Pharyngeal - Thin  Pharyngeal- Thin Teaspoon  Swallow initiation at vallecula;Reduced epiglottic inversion;Reduced tongue base retraction;Pharyngeal residue - valleculae   Pharyngeal- Thin Cup  Swallow initiation at vallecula;Swallow initiation at pyriform sinus;Reduced epiglottic inversion;Reduced tongue base retraction;Reduced airway/laryngeal closure;Penetration/Aspiration before swallow;Penetration/Aspiration during swallow;Trace aspiration;Pharyngeal residue - valleculae   Pharyngeal  Material does not enter airway;Material enters airway, remains ABOVE vocal cords then ejected out;Material enters airway, passes BELOW cords then ejected out;Material enters airway, CONTACTS cords and not ejected out    Pharyngeal - Solids  Pharyngeal- Puree  Within functional limits   Pharyngeal- Regular  Reduced epiglottic inversion;Reduced tongue base retraction;Pharyngeal residue - valleculae   Pharyngeal- Pill  Within functional limits  in puree, transiently  delayed in valleculae   Electrical Stimulation - Pharyngeal Phase  Was Electrical Stimulation Used  No   Cricopharyngeal Phase - 05/05/18 1531    Cervical Esophageal Phase  Cervical Esophageal Phase  Impaired    Cervical Esophageal Phase - Thin  Thin Cup  Prominent cricopharyngeal segment   Plan - 05/05/18 1533   Clinical Impression Statement  Pt presents with moderate oropharyngeal phase dysphagia characterized by reduced bolus cohesiveness with liquids, impaired mastication, premature spillage, decreased tongue base retraction and epiglottic deflection resulting in variable min penetration x2 to the vocal folds with cup sips thin from vallecular residuals spilling to pyriforms and around arytenoids. Pt had one episode where contrast extended below cords and was cleared with a cued cough. Pt achieved improved epiglottic deflection with puree and nectar-thickened liquids. Pt with mild/mod vallecular residue with regular solids which cleared with repeat/dry swallow. Pt reportedly with suspected aspiration PNA, however no gross aspiration observed during today's assessment. Pt is likely to experience trace aspiration from residuals after the swallow and/or from secretions. Pt wears cervical collar throughout the day at this time. Consider placing Pt on nectar-thick liquids  given the above with dysphagia therapy to focus on effortful swallow, tongue base strengthening, and implementation of strategies (complete dry swallows and clear throat periodically) until Pt is stronger overall. Swallow function was WNL during puree swallows. Recommend D3/mech soft and nectar-thick liquids; consider repeat objective study in 4-6 weeks or as clinically appropriate.  Patient will benefit from skilled therapeutic intervention in order to improve the following deficits and impairments:  Dysphagia, oropharyngeal phase Recommendations/Treatment - 05/05/18 1531    Swallow Evaluation Recommendations  SLP Diet Recommendations  Dysphagia 3  (mechanical soft);Nectar   Thickener user  Simply thick   Liquid Administration via  Cup   Medication Administration  Whole meds with puree   Supervision  Patient able to self feed;Full supervision/cueing for compensatory strategies   Compensations  Slow rate;Multiple dry swallows after each bite/sip;Clear throat intermittently;Effortful swallow   Postural Changes  Seated upright at 90 degrees;Remain upright for at least 30 minutes after feeds/meals   Problem List Patient Active Problem List  Diagnosis Date Noted . Recurrent falls 04/25/2018 . Protein-calorie malnutrition, severe 04/18/2018 . Hydronephrosis  . Syncope and collapse  . Acute metabolic encephalopathy 33/82/5053 . Failure to thrive in adult 04/14/2018 . Acute urinary retention 04/14/2018 . Fecal impaction ([place]) 04/14/2018 . Goals of care, counseling/discussion  . Palliative care by specialist  . DNR (do not resuscitate) discussion  . Gout 01/14/2018 . Lobar pneumonia, unspecified organism ([place]) 10/29/2017 . Pleural effusion, right  . Pedal edema 05/14/2017 . Parkinson's disease ([place]) 10/05/2016 . Diastolic CHF, chronic ([place]) 05/10/2015 . Dizziness  . Coronary artery disease involving coronary bypass graft of native heart without angina pectoris  . Near syncope 04/02/2015 . Diarrhea 04/02/2015 . 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 COPD exacerbation ([place]) 06/04/2013 . Shortness of breath 06/04/2013 . Acute respiratory failure with hypoxia ([place]) 06/04/2013 . Hemoptysis 08/10/2012 . Malignant neoplasm of upper lobe, left bronchus or lung ([place]) 01/06/2012 . Prediabetes 08/28/2011 . Lung cancer ([place]) 08/25/2011 . COPD (chronic obstructive pulmonary disease) ([place]) 08/25/2011 . CKD (chronic kidney disease), stage III ([place]) 12/07/2010 . Chest pain 08/18/2010 . Abdominal pain 08/18/2010 . Syncope  . Tobacco abuse, in remission  . Degenerative joint disease of knee, left  . COLONIC POLYPS 05/20/2009 . Hyperlipidemia 05/20/2009 . ANEMIA  05/20/2009 . Essential tremor 05/20/2009 . ATHEROSCLEROTIC CARDIOVASCULAR DISEASE 05/20/2009 . CEREBROVASCULAR DISEASE 05/20/2009 . Essential hypertension 01/14/2009 Thank you, Genene Churn, [facility] 05/05/2018, 3:39 PM [place], [place], [address] Phone: [phone]   Fax:  [phone] Name: GILLIAM HAWKES MRN: [id number] Date of Birth: Nov 10, 1921 CLINICAL DATA:  Dysphagia, history of fall with C2 fracture EXAM: MODIFIED BARIUM SWALLOW TECHNIQUE: Different consistencies of barium were administered orally to the patient by the Speech Pathologist. Imaging of the pharynx was performed in the lateral projection. FLUOROSCOPY TIME:  Fluoroscopy Time:  5 minutes 30 seconds Radiation Exposure Index (if provided by the fluoroscopic device): 33.7 mGy Number of Acquired Spot Images: Multiple fluoroscopic series COMPARISON:  None FINDINGS: Thin liquid-with teaspoon, normal motion is seen without laryngeal penetration or aspiration. Vallecular residuals and minimal piriform sinus residuals are noted. With sips of thin barium by cup, laryngeal penetration was identified, predominantly from contrast at the arytenoids and piriform sinuses, with contrast extending onto the vocal cords. No aspiration. Less laryngeal penetration was seen with bolus holding prior to swallow. Vallecular residuals persisted. Nectar thick liquid-vallecular residuals. No laryngeal penetration or aspiration. Honey-not evaluated Pure- within normal limits Cracker-within  normal limits Pure with cracker-not evaluated Barium tablet-swallow with applesauce without difficulty. Incidentally noted is hypomotility of the thoracic esophagus. IMPRESSION: Laryngeal penetration of thin liquid contrast to the level of the vocal cords for, primarily from contrast at the RIGHT nodes and the piriform sinuses. No aspiration of contrast into the trachea. Remainder of exam unremarkable.  Please refer to the Speech Pathologists report for complete details and recommendations. Electronically Signed   By: Lavonia Dana M.D.   On: 05/05/2018 13:34    Assessment/Plan  #1 suspected left foot cellulitis this appears to be improving he is on doxycycline for short course this will need follow-up by primary care provider but this appears to be resolving unremarkably There also may be some erythema secondary to having the leg in a dependent position since erythema appears to be increased later in the day-again we will give him a short course of doxycycline with close monitoring and follow-up by primary care provider .  [id number]

## 2018-05-27 ENCOUNTER — Other Ambulatory Visit: Payer: Self-pay | Admitting: Family Medicine

## 2018-05-27 ENCOUNTER — Telehealth: Payer: Self-pay | Admitting: Family Medicine

## 2018-05-27 NOTE — Telephone Encounter (Signed)
Tried to contact daughter to inform her the hospice referral has been placed. Unable to accept calls

## 2018-05-27 NOTE — Telephone Encounter (Signed)
Referral has been faxed to hospice

## 2018-05-27 NOTE — Telephone Encounter (Signed)
Patient's daughter would like to know when hospice will be placed for [name], and when will he receive his hospital. Advise.  [name] is on DPR

## 2018-05-27 NOTE — Telephone Encounter (Signed)
Please advise. Thank you

## 2018-05-27 NOTE — Telephone Encounter (Signed)
Tried to contact daughter; message states that person can not receive calls at this time. Will try agian

## 2018-05-30 ENCOUNTER — Ambulatory Visit: Payer: Medicare Other | Admitting: Family Medicine

## 2018-05-30 NOTE — Telephone Encounter (Signed)
Tried to call no answer

## 2018-05-31 NOTE — Telephone Encounter (Signed)
Tried to call. Not able to receive calls at this time.

## 2018-06-01 ENCOUNTER — Ambulatory Visit: Payer: Medicare Other | Admitting: Family Medicine

## 2018-06-02 NOTE — Telephone Encounter (Signed)
Telephone call- not able to receive calls at this time

## 2018-06-03 NOTE — Telephone Encounter (Signed)
Tried to call. Unable to receive phone calls at this time.

## 2018-06-07 NOTE — Telephone Encounter (Signed)
Per [facility] has been coming in three times per week started around a week ago.

## 2018-06-19 ENCOUNTER — Telehealth: Payer: Self-pay | Admitting: Family Medicine

## 2018-06-19 NOTE — Telephone Encounter (Signed)
Nurses I did a home visit for this patient I recommended for him to stop the atorvastatin Hold metoprolol Change torsemide to 1/2 tablet Monday Wednesday Friday Please make these changes in epic Please go ahead and write the medical changes on letterhead as an order to fax to hospice to go along with the form they sent Thanks

## 2018-06-20 ENCOUNTER — Other Ambulatory Visit: Payer: Self-pay

## 2018-06-20 MED ORDER — TORSEMIDE 10 MG PO TABS: ORAL_TABLET | ORAL | Status: AC

## 2018-06-20 NOTE — Telephone Encounter (Signed)
Changes made in Epic. Medical changes written out on Letterhead. Unable to fax due to fax machine being down.

## 2018-06-20 NOTE — Telephone Encounter (Signed)
I would recommend putting on there to hold metoprolol till further notice\ May leave it on the medication list But underneath indicate that patient not taking

## 2018-06-20 NOTE — Telephone Encounter (Signed)
How long should the metoprolol be held? Please advise. Thank you.

## 2018-06-23 ENCOUNTER — Ambulatory Visit: Payer: Medicare Other | Admitting: Family Medicine

## 2018-06-23 ENCOUNTER — Telehealth: Payer: Self-pay | Admitting: Family Medicine

## 2018-06-23 NOTE — Telephone Encounter (Signed)
Spoke with pt's son and they agree a home visit next week would be better. I informed son once [name] told me a day he would come I would call him back.

## 2018-06-24 NOTE — Telephone Encounter (Signed)
Letter faxed to [facility]-confirmation received.

## 2018-06-30 ENCOUNTER — Other Ambulatory Visit: Payer: Self-pay

## 2018-06-30 ENCOUNTER — Ambulatory Visit: Admitting: Family Medicine

## 2018-07-01 NOTE — Telephone Encounter (Signed)
Home visit was made on Thursday evening thank you

## 2018-07-13 DEATH — deceased
